# Patient Record
Sex: Female | Born: 1945 | Race: White | Hispanic: No | Smoking: Never smoker
Health system: Southern US, Community
[De-identification: ages and names within clinical notes are randomized; demographics above are authoritative.]

## PROBLEM LIST (undated history)

## (undated) DIAGNOSIS — M545 Low back pain, unspecified: Secondary | ICD-10-CM

## (undated) DIAGNOSIS — Z9889 Other specified postprocedural states: Secondary | ICD-10-CM

## (undated) DIAGNOSIS — I471 Supraventricular tachycardia, unspecified: Secondary | ICD-10-CM

## (undated) DIAGNOSIS — I499 Cardiac arrhythmia, unspecified: Secondary | ICD-10-CM

## (undated) DIAGNOSIS — I071 Rheumatic tricuspid insufficiency: Secondary | ICD-10-CM

## (undated) DIAGNOSIS — I4719 Other supraventricular tachycardia: Secondary | ICD-10-CM

## (undated) DIAGNOSIS — G8929 Other chronic pain: Secondary | ICD-10-CM

## (undated) DIAGNOSIS — I350 Nonrheumatic aortic (valve) stenosis: Secondary | ICD-10-CM

## (undated) DIAGNOSIS — E785 Hyperlipidemia, unspecified: Secondary | ICD-10-CM

## (undated) DIAGNOSIS — F32A Depression, unspecified: Secondary | ICD-10-CM

## (undated) DIAGNOSIS — N1832 Chronic kidney disease, stage 3b: Secondary | ICD-10-CM

## (undated) DIAGNOSIS — F329 Major depressive disorder, single episode, unspecified: Secondary | ICD-10-CM

## (undated) DIAGNOSIS — D649 Anemia, unspecified: Secondary | ICD-10-CM

## (undated) DIAGNOSIS — I951 Orthostatic hypotension: Secondary | ICD-10-CM

## (undated) DIAGNOSIS — M1990 Unspecified osteoarthritis, unspecified site: Secondary | ICD-10-CM

## (undated) DIAGNOSIS — I351 Nonrheumatic aortic (valve) insufficiency: Secondary | ICD-10-CM

## (undated) DIAGNOSIS — I209 Angina pectoris, unspecified: Secondary | ICD-10-CM

## (undated) DIAGNOSIS — J189 Pneumonia, unspecified organism: Secondary | ICD-10-CM

## (undated) DIAGNOSIS — R112 Nausea with vomiting, unspecified: Secondary | ICD-10-CM

## (undated) DIAGNOSIS — K219 Gastro-esophageal reflux disease without esophagitis: Secondary | ICD-10-CM

## (undated) DIAGNOSIS — I10 Essential (primary) hypertension: Secondary | ICD-10-CM

## (undated) DIAGNOSIS — K635 Polyp of colon: Secondary | ICD-10-CM

## (undated) DIAGNOSIS — F419 Anxiety disorder, unspecified: Secondary | ICD-10-CM

## (undated) DIAGNOSIS — I340 Nonrheumatic mitral (valve) insufficiency: Secondary | ICD-10-CM

## (undated) HISTORY — DX: Chronic kidney disease, stage 3b: N18.32

## (undated) HISTORY — DX: Hyperlipidemia, unspecified: E78.5

## (undated) HISTORY — DX: Gastro-esophageal reflux disease without esophagitis: K21.9

## (undated) HISTORY — DX: Depression, unspecified: F32.A

## (undated) HISTORY — DX: Unspecified osteoarthritis, unspecified site: M19.90

## (undated) HISTORY — DX: Essential (primary) hypertension: I10

## (undated) HISTORY — DX: Orthostatic hypotension: I95.1

## (undated) HISTORY — DX: Supraventricular tachycardia: I47.1

## (undated) HISTORY — DX: Nonrheumatic aortic (valve) stenosis: I35.0

## (undated) HISTORY — DX: Other supraventricular tachycardia: I47.19

## (undated) HISTORY — PX: CHOLECYSTECTOMY: SHX55

## (undated) HISTORY — PX: EYE SURGERY: SHX253

## (undated) HISTORY — DX: Nonrheumatic aortic (valve) insufficiency: I35.1

## (undated) HISTORY — DX: Polyp of colon: K63.5

## (undated) HISTORY — DX: Nonrheumatic mitral (valve) insufficiency: I34.0

## (undated) HISTORY — DX: Rheumatic tricuspid insufficiency: I07.1

## (undated) HISTORY — DX: Major depressive disorder, single episode, unspecified: F32.9

---

## 1978-02-14 HISTORY — PX: COSMETIC SURGERY: SHX468

## 1997-02-14 DIAGNOSIS — K635 Polyp of colon: Secondary | ICD-10-CM

## 1997-02-14 HISTORY — DX: Polyp of colon: K63.5

## 1997-06-03 ENCOUNTER — Other Ambulatory Visit: Admission: RE | Admit: 1997-06-03 | Discharge: 1997-06-03 | Payer: Self-pay | Admitting: Family Medicine

## 1997-09-17 ENCOUNTER — Ambulatory Visit (HOSPITAL_BASED_OUTPATIENT_CLINIC_OR_DEPARTMENT_OTHER): Admission: RE | Admit: 1997-09-17 | Discharge: 1997-09-17 | Payer: Self-pay | Admitting: Orthopedic Surgery

## 1998-04-16 ENCOUNTER — Ambulatory Visit (HOSPITAL_COMMUNITY): Admission: RE | Admit: 1998-04-16 | Discharge: 1998-04-16 | Payer: Self-pay

## 1999-03-12 ENCOUNTER — Other Ambulatory Visit: Admission: RE | Admit: 1999-03-12 | Discharge: 1999-03-12 | Payer: Self-pay | Admitting: Obstetrics and Gynecology

## 2000-04-06 ENCOUNTER — Other Ambulatory Visit: Admission: RE | Admit: 2000-04-06 | Discharge: 2000-04-06 | Payer: Self-pay | Admitting: Obstetrics and Gynecology

## 2001-04-11 ENCOUNTER — Other Ambulatory Visit: Admission: RE | Admit: 2001-04-11 | Discharge: 2001-04-11 | Payer: Self-pay | Admitting: Obstetrics and Gynecology

## 2002-04-15 ENCOUNTER — Other Ambulatory Visit: Admission: RE | Admit: 2002-04-15 | Discharge: 2002-04-15 | Payer: Self-pay | Admitting: Obstetrics and Gynecology

## 2002-05-06 ENCOUNTER — Encounter (INDEPENDENT_AMBULATORY_CARE_PROVIDER_SITE_OTHER): Payer: Self-pay | Admitting: Specialist

## 2002-05-06 ENCOUNTER — Ambulatory Visit (HOSPITAL_COMMUNITY): Admission: RE | Admit: 2002-05-06 | Discharge: 2002-05-06 | Payer: Self-pay | Admitting: Gastroenterology

## 2003-04-23 ENCOUNTER — Other Ambulatory Visit: Admission: RE | Admit: 2003-04-23 | Discharge: 2003-04-23 | Payer: Self-pay | Admitting: Obstetrics and Gynecology

## 2004-04-23 ENCOUNTER — Other Ambulatory Visit: Admission: RE | Admit: 2004-04-23 | Discharge: 2004-04-23 | Payer: Self-pay | Admitting: Obstetrics and Gynecology

## 2005-04-25 ENCOUNTER — Other Ambulatory Visit: Admission: RE | Admit: 2005-04-25 | Discharge: 2005-04-25 | Payer: Self-pay | Admitting: Obstetrics and Gynecology

## 2006-05-13 ENCOUNTER — Encounter: Admission: RE | Admit: 2006-05-13 | Discharge: 2006-05-13 | Payer: Self-pay | Admitting: Orthopaedic Surgery

## 2006-05-17 ENCOUNTER — Other Ambulatory Visit: Admission: RE | Admit: 2006-05-17 | Discharge: 2006-05-17 | Payer: Self-pay | Admitting: Obstetrics and Gynecology

## 2007-05-23 ENCOUNTER — Other Ambulatory Visit: Admission: RE | Admit: 2007-05-23 | Discharge: 2007-05-23 | Payer: Self-pay | Admitting: Obstetrics and Gynecology

## 2008-05-28 ENCOUNTER — Other Ambulatory Visit: Admission: RE | Admit: 2008-05-28 | Discharge: 2008-05-28 | Payer: Self-pay | Admitting: Obstetrics and Gynecology

## 2008-10-08 ENCOUNTER — Observation Stay (HOSPITAL_COMMUNITY): Admission: EM | Admit: 2008-10-08 | Discharge: 2008-10-10 | Payer: Self-pay | Admitting: Emergency Medicine

## 2010-05-22 LAB — URINALYSIS, ROUTINE W REFLEX MICROSCOPIC
Bilirubin Urine: NEGATIVE
Ketones, ur: NEGATIVE mg/dL
Nitrite: NEGATIVE
Specific Gravity, Urine: 1.009 (ref 1.005–1.030)
Urobilinogen, UA: 0.2 mg/dL (ref 0.0–1.0)

## 2010-05-22 LAB — CBC
Platelets: 243 10*3/uL (ref 150–400)
RDW: 13.4 % (ref 11.5–15.5)

## 2010-05-22 LAB — CARDIAC PANEL(CRET KIN+CKTOT+MB+TROPI)
CK, MB: 1.3 ng/mL (ref 0.3–4.0)
CK, MB: 1.4 ng/mL (ref 0.3–4.0)
Relative Index: INVALID (ref 0.0–2.5)
Relative Index: INVALID (ref 0.0–2.5)
Total CK: 53 U/L (ref 7–177)
Troponin I: 0.01 ng/mL (ref 0.00–0.06)
Troponin I: 0.01 ng/mL (ref 0.00–0.06)

## 2010-05-22 LAB — DIFFERENTIAL
Basophils Absolute: 0 10*3/uL (ref 0.0–0.1)
Lymphocytes Relative: 13 % (ref 12–46)
Neutro Abs: 5.9 10*3/uL (ref 1.7–7.7)

## 2010-05-22 LAB — BASIC METABOLIC PANEL
BUN: 10 mg/dL (ref 6–23)
Calcium: 8.6 mg/dL (ref 8.4–10.5)
GFR calc non Af Amer: >60 mL/min (ref >60)
Glucose, Bld: 103 mg/dL — ABNORMAL HIGH (ref 70–99)

## 2010-06-29 NOTE — H&P (Signed)
Nicole Graves, Nicole Graves                  ACCOUNT NO.:  [id number]   MEDICAL RECORD NO.:  [id number]          PATIENT TYPE:  EMS   LOCATION:  ED                           FACILITY:  [facility]   PHYSICIAN:  Peggye Pitt, M.D. DATE OF BIRTH:  1945/08/15   DATE OF ADMISSION:  10/08/2008  DATE OF DISCHARGE:                              HISTORY & PHYSICAL   PRIMARY CARE PHYSICIAN:  [name], MD   CHIEF COMPLAINT:  Vertigo.   HISTORY OF PRESENT ILLNESS:  Nicole Graves is a 65 year old female who  yesterday afternoon became very dizzy, said that the room was spinning.  She fell down.  She vomited her undigested lunch.  She continued to feel  nauseous and vomit throughout the evening.  During this time, she states  that the room continued to spin.  After a couple of hours, the spinning  passed.  She was able to get up and go to bed.  She took her evening  Flexeril and tramadol and slept well through the night.  This morning,  she woke up and felt okay.  Upon arising, she sat on the side of the bed  and had no dizziness and no vertigo.  She went to the bathroom, washed  her face, and then walking down the floor, the room began to spin; she  fell down and hit her nose.  She stayed on the floor this morning for 4  hours, continued with nausea and dry heaving.  She called family  members, and she was unable to get up off the floor secondary to  weakness, so they called 911 who transported her to the emergency room.  During these episodes, she denies chest pain, shortness of breath, no  headache, visual disturbances, no numbness or tingling of her  extremities.  She continues with her vertigo, and we were asked to admit  for further evaluation and treatment.   ALLERGIES:  SULFA.   PAST MEDICAL HISTORY:  1. Depression.  2. GERD.  3. Hiatal hernia.   PAST SURGICAL HISTORY:  1. Cholecystectomy.  2. Cesarean section x2.  3. Tubes in her ears removed 3 years ago.  4. Surgery on her toe in 2005.   SOCIAL HISTORY:  Negative for EtOH, negative for tobacco.   FAMILY HISTORY:  Mother deceased at the age of 48 with a history of  colon cancer, carotid artery disease, cataracts, degenerative joint  disease and congestive heart failure.  Father was unknown.  Brother  deceased recently with a history of degenerative joint disease and  coronary artery disease.  She has a sister who is alive with CAD,  diabetes, glaucoma and osteoarthritis.   MEDICATIONS:  1. Cymbalta 30 mg p.o. 3 tabs daily.  2. Wellbutrin 300 mg p.o. daily.  3. Vitamin D 2000 mEq p.o. daily.  4. Claritin 10 mg p.o. daily.  5. Prilosec 20 mg p.o. b.i.d.  6. Celebrex 200 mg p.o. b.i.d.  7. Flexeril 10 mg p.o. at bedtime.  8. Tramadol 50 mg 2 tabs at bedtime.   REVIEW OF SYSTEMS:  CONSTITUTIONAL:  Negative for fever,  chills,  positive for increased fatigue and weakness over the last 2 months.  HEENT:  Positive for nasal congestion, chronic sinus congestion,  negative for throat, negative ear pain or ringing of the ears.  CARDIOVASCULAR:  Negative for chest pain, negative for lower extremity  edema.  RESPIRATORY:  Negative shortness of breath, negative cough.  NEURO:  See HPI.  MUSCULOSKELETAL:  Positive for occasional knee pain,  positive for neck pain, otherwise no joint pain or muscle weakness.  GI:  Positive constipation, negative melena, negative abdominal pain.  GU:  Negative dysuria, negative hematuria, positive occasional frequency.  PSYCH:  Positive for depression, positive for anxiety.  SKIN:  Negative  for rashes.  Negative for lesions.   LABORATORIES AND RADIOLOGY:  White count 7.1, hemoglobin 13.3,  hematocrit 38.7, platelets 243, sodium 137, potassium 3.9, chloride 107,  CO2 of 26, BUN 10, creatinine 0.81, glucose 103, neutrophils with an 82%  shift, UA negative.  EKG with normal sinus rhythm at a rate of 86.  CT  spine:  Negative fracture, anterior subluxation C3 on C4 and  retrolisthesis C4 on C5.  CT  of the head:  No acute intracranial  abnormality, negative for hemorrhage, mass or lesion.  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   PHYSICAL EXAMINATION:  VITAL SIGNS:  Temperature 97.5, blood pressure  132/72, heart rate 89, respirations 18, O2 saturations 97% on room air.  GENERAL:  Alert and oriented, in no acute distress.  HEENT:  Head is normocephalic, atraumatic.  NECK:  Supple.  No JVD.  CARDIOVASCULAR:  Regular rate and rhythm.  No lower extremity edema.  No  murmur.  No rub.  NEUROLOGIC:  Speech clear.  Facial symmetry.  Sensation intact  bilaterally.  Bilateral grips equal, 5/5.  RESPIRATORY:  No increased work of breathing, clear to auscultation  bilaterally, no wheeze, no rales.  SKIN:  Warm and dry.  No lesions.  No rashes.  ABDOMEN:  Round, soft, decreased bowel sounds, nontender to palpation.  PSYCHIATRIC:  Very teary this afternoon secondary to the recent death of  her brother.  She is calm, cooperative, verbalizes anxiety about her  health.   ASSESSMENT/PLAN:  1. Vertigo, nausea and vomiting:  Vertigo origin is unclear. ?BPH vs      osthostasis vs posterior circulation CVA vs cardiac etiology      (unlikely).  EKG is normal.  CT of the head with no acute      abnormality.  CBC and electrolytes within normal limits.  Will rule      out cardiac source by admitting to telemetry.  Cycle cardiac      enzymes.  Check orthostatics.  Will also check magnesium and      calcium levels.  Patient's medications could also be contributing,      so we will hold Prilosec, Celebrex, Flexeril and tramadol.  Will      ask physical therapy and occupational therapy for vestibular      exercises.  If vertigo does not improve after above measures, will       consider MRI of brain to rule out posterior circulation      cerebrovascular accident.  Will also consider discontinuing      telemetry after the first 24 hours if no  arrhythmias.  2. Depression:  Currently stable.  Will continue Cymbalta and      Wellbutrin.  3. Gastroesophageal reflux disease:  Currently stable.  Will hold      Prilosec for now secondary to vertigo.  Will monitor closely and      adjust as needed.     ______________________________  Toya Smothers, NP      Peggye Pitt, M.D.  Electronically Signed    KB/MEDQ  D:  10/08/2008  T:  10/08/2008  Job:  [id number]

## 2010-07-02 NOTE — Op Note (Signed)
   NAME:  Nicole Graves, Nicole Graves                            ACCOUNT NO.:  [id number]   MEDICAL RECORD NO.:  [id number]                   PATIENT TYPE:  AMB   LOCATION:  ENDO                                 FACILITY:  [facility]   PHYSICIAN:  Petra Kuba, M.D.                 DATE OF BIRTH:  19-Dec-1945   DATE OF PROCEDURE:  05/06/2002  DATE OF DISCHARGE:                                 OPERATIVE REPORT   PROCEDURE PERFORMED:  Esophagogastroduodenoscopy.   ENDOSCOPIST:  Petra Kuba, M.D.   INDICATIONS FOR PROCEDURE:  Patient with longstanding upper tract symptoms,  want to rule out significant abnormality.  Consent was signed after the  risks, benefits, methods and options were thoroughly discussed prior to any  premeds given and multiple times in the past.   ADDITIONAL MEDICINES USED:  Demerol 40 mg only since it followed  colonoscopy.   DESCRIPTION OF PROCEDURE:  The video endoscope was inserted by direct  vision.  She did have a small to medium size hiatal hernia but no signs of  esophagitis or Barrett's.  Scope passed into the stomach and advanced  through a normal antrum, normal pylorus into a normal duodenal bulb and  around the C-loop to a normal second portion of the duodenum.  The scope was  withdrawn back to the bulb and a good look there ruled out ulcers in that  location.  Scope was withdrawn back to the stomach which was evaluated on  straight and retroflex visualization.  High in the cardia, the hiatal hernia  was confirmed.  Otherwise, the fundus, angularis, lesser and greater curve  were normal.  Air was suctioned, scope was slowly withdrawn.  Again, a good  look at the hiatal hernia pouch in the esophagus was normal.  Scope was  removed.  The patient tolerated the procedure well.  There were no obvious  immediate complication.   ENDOSCOPIC DIAGNOSES:  1. Small to medium-sized hiatal hernia.  2. Otherwise normal esophagogastroduodenoscopy.   PLAN:  Continue pump inhibitors.   Happy to see back p.r.n.  Otherwise return  care to Dr. Hyacinth Meeker for the customary health care maintenance to include  yearly rectals and guaiacs.                                               Petra Kuba, M.D.    MEM/MEDQ  D:  05/06/2002  T:  05/06/2002  Job:  [id number]   cc:   [name], M.D.  [address]  [address] [place], [place] [address]  Fax: [phone]

## 2010-07-02 NOTE — Op Note (Signed)
   NAME:  Nicole Graves, Nicole Graves                            ACCOUNT NO.:  [id number]   MEDICAL RECORD NO.:  [id number]                   PATIENT TYPE:  AMB   LOCATION:  ENDO                                 FACILITY:   PHYSICIAN:  Petra Kuba, M.D.                 DATE OF BIRTH:  February 08, 1946   DATE OF PROCEDURE:  05/06/2002  DATE OF DISCHARGE:                                 OPERATIVE REPORT   PROCEDURE:  Colonoscopy.   INDICATIONS:  Family history of colon cancer.  Due for repeat screening.  Consent was signed after risks, benefits, methods, options were thoroughly  discussed in the office in the past.   PREMEDICATIONS:  Demerol 60 mg, Versed 10 mg.   DESCRIPTION OF PROCEDURE:  Rectal inspection pertinent for external  hemorrhoids, small.  Digital exam was negative.  Video colonoscope was  inserted and fairly easily advanced around the colon to the cecum.  This  required some abdominal pressure but no position changes.  No obvious  abnormality was seen on insertion.  The cecum was identified by the  appendiceal orifice and the ileocecal valve.  The scope was slowly  withdrawn.  Prep was adequate.  There was some liquid stool that required  washing and suctioning.  On slow withdrawal through the colon, the cecum,  ascending and transverse were normal.  There was a rare early left-sided  diverticula and one tiny distal sigmoid polyp which was cold biopsied x 2.  Once back in the rectum, the scope was retroflexed, pertinent for some  internal hemorrhoids.  The scope was straightened and readvanced a short  ways up the left side of the colon.  Air was suctioned and the scope  removed.  The patient tolerated the procedure well.  There were no obvious  immediate complications.   ENDOSCOPIC DIAGNOSES:  1. Internal and external hemorrhoids.  2. Rare left-sided diverticula.  3. Tiny distal sigmoid polyp cold biopsied.  4. Otherwise within normal limits to the cecum.   PLAN:  Await pathology.   Probably recheck colon screening every five years.  Continue workup with a one-time EGD.                                                Petra Kuba, M.D.    MEM/MEDQ  D:  05/06/2002  T:  05/06/2002  Job:  [id number]   cc:   [name], M.D.  [address]  [address] [place], [place] [address]  Fax: [phone]

## 2012-08-09 ENCOUNTER — Encounter: Payer: Self-pay | Admitting: Obstetrics and Gynecology

## 2012-08-15 ENCOUNTER — Ambulatory Visit: Payer: Self-pay | Admitting: Obstetrics and Gynecology

## 2012-12-24 ENCOUNTER — Telehealth: Payer: Self-pay | Admitting: Cardiology

## 2012-12-24 NOTE — Telephone Encounter (Signed)
Pt is aware.  

## 2012-12-24 NOTE — Telephone Encounter (Signed)
To Dr Turner to advise 

## 2012-12-24 NOTE — Telephone Encounter (Signed)
New Problem:  Pt is asking if she can switch to taking her toporlol at night b/c it is making her sleepy. Please advise

## 2012-12-24 NOTE — Telephone Encounter (Signed)
Ok to take at night. 

## 2013-09-06 ENCOUNTER — Encounter: Payer: Self-pay | Admitting: Cardiology

## 2013-11-27 ENCOUNTER — Encounter: Payer: Self-pay | Admitting: Physician Assistant

## 2013-11-27 ENCOUNTER — Ambulatory Visit (INDEPENDENT_AMBULATORY_CARE_PROVIDER_SITE_OTHER): Payer: Medicare Other | Admitting: Physician Assistant

## 2013-11-27 VITALS — BP 130/90 | HR 92 | Ht 65.0 in | Wt 198.0 lb

## 2013-11-27 DIAGNOSIS — I471 Supraventricular tachycardia: Secondary | ICD-10-CM | POA: Insufficient documentation

## 2013-11-27 MED ORDER — METOPROLOL SUCCINATE ER 25 MG PO TB24: 25.0000 mg | ORAL_TABLET | Freq: Every evening | ORAL | Status: DC | PRN

## 2013-11-27 NOTE — Progress Notes (Signed)
Cardiology Office Note   Date:  11/27/2013   ID:  Nicole Graves, DOB [birth year], MRN [id number]  PCP:  No primary provider on file.  Cardiologist:  Dr. Armanda Magicraci Turner     History of Present Illness: Nicole Graves is a 68 y.o. female with a history of paroxysmal atrial tachycardia. Last seen by Dr. Mayford Knifeurner 10/2012.  She returns for FU.  She is overall doing well.  She has occasional chest pain.  She has had this for years without change.  She denies significant dyspnea, orthopnea, PND, edema.  She denies syncope.  Her palpitations remain controlled on beta blocker.  She now takes it at bedtime due to fatigue.    Studies:  - Echo (1/13):  Mild LVH, moderate asymmetric septal hypertrophy, EF 50-55%, mild TR, grade 1 diastolic dysfunction  - Nuclear (total 1/13):  No ischemia, EF 73%   Recent Labs/Images:  No results found for this basename: NA, K, BUN, CREATININE, ALT, HGB, TSH, LDL, LDLCALC, LDLDIRECT, HDL, BNP, PROBNP,  in the last 8760 hours  02/05/13: Creatinine 0.98, potassium 4.4, ALT 10    Wt Readings from Last 3 Encounters:  11/27/13 198 lb (89.812 kg)     Past Medical History  Diagnosis Date  . Depression   . Colon polyp 1999  . GERD (gastroesophageal reflux disease)   . Arthritis     Knees, neck, back  . GERD (gastroesophageal reflux disease)     Current Outpatient Prescriptions  Medication Sig Dispense Refill  . buPROPion (WELLBUTRIN XL) 300 MG 24 hr tablet Take 300 mg by mouth daily.      . celecoxib (CELEBREX) 200 MG capsule Take 200 mg by mouth daily.       . Cholecalciferol (VITAMIN D) 2000 UNITS CAPS Take by mouth.      . cyclobenzaprine (FLEXERIL) 10 MG tablet Take 10 mg by mouth daily.       . DULoxetine (CYMBALTA) 30 MG capsule Take 30 mg by mouth daily.      . metoprolol succinate (TOPROL-XL) 25 MG 24 hr tablet Take 25 mg by mouth at bedtime and may repeat dose one time if needed.       Marland Kitchen. omeprazole (PRILOSEC) 20 MG capsule Take 20 mg by mouth daily.        . Pantoprazole Sodium (PROTONIX PO) Take by mouth.      . traMADol (ULTRAM) 50 MG tablet Take 50 mg by mouth at bedtime and may repeat dose one time if needed.        No current facility-administered medications for this visit.     Allergies:   Review of patient's allergies indicates no known allergies.   Social History:  The patient  reports that she has never smoked. She does not have any smokeless tobacco history on file.   Family History:  The patient's family history includes Colon cancer in her mother; Heart attack in her brother; Hypertension in her brother. There is no history of Stroke.   ROS:  Please see the history of present illness.      All other systems reviewed and negative.    PHYSICAL EXAM: VS:  BP 130/90  Pulse 92  Ht 5\' 5"  (1.651 m)  Wt 198 lb (89.812 kg)  BMI 32.95 kg/m2 Well nourished, well developed, in no acute distress HEENT: normal Neck: no JVD Cardiac:  normal S1, S2; RRR; 2/6 systolic murmurRUSB Lungs:  clear to auscultation bilaterally, no wheezing, rhonchi or rales Abd: soft, nontender,  no hepatomegaly Ext: no edema Skin: warm and dry Neuro:  CNs 2-12 intact, no focal abnormalities noted  EKG:  NSR, HR 92, normal axis, no ST changes      ASSESSMENT AND PLAN:  1.  Atrial Tachycardia:  Stable on beta blocker. Continue current Rx.  Medication refilled.  Disposition:   FU with Dr. Armanda Magicraci Turner 1 year.    Signed, Brynda RimScott Yoshiye Kraft, PA-C, MHS 11/27/2013 2:40 PM    [facility] [address], [place], [place]  [address] Phone: [phone]; Fax: [phone]

## 2013-11-27 NOTE — Patient Instructions (Signed)
Schedule a follow up with Dr. Armanda Magicraci Turner in 1 year.  The office will send you a card 2-3 months prior to this so you can call to schedule.  A refill for your Toprol-XL was sent to your pharmacy.

## 2014-12-22 ENCOUNTER — Other Ambulatory Visit: Payer: Self-pay

## 2014-12-22 DIAGNOSIS — I471 Supraventricular tachycardia: Secondary | ICD-10-CM

## 2014-12-22 MED ORDER — METOPROLOL SUCCINATE ER 25 MG PO TB24: 25.0000 mg | ORAL_TABLET | Freq: Every evening | ORAL | Status: DC | PRN

## 2015-02-10 ENCOUNTER — Ambulatory Visit: Payer: Self-pay | Admitting: Physician Assistant

## 2015-02-10 NOTE — Progress Notes (Signed)
Cardiology Office Note    Date:  02/11/2015   ID:  Carlyon ShadowLinda J Klose, DOB 27-Sep-1945, MRN [id number]  PCP:  Neldon LabellaMILLER,LISA LYNN, MD  Cardiologist:  Dr. Armanda Magicraci Turner   Electrophysiologist:  n/a  Chief Complaint  Patient presents with  . Follow-up    Atrial Tachycardia    History of Present Illness:  Nicole Graves is a 69 y.o. female with a hx of paroxysmal atrial tachycardia. Last seen by Dr. Mayford Knifeurner 10/2012.I saw her in 10/15.  She returns for FU. She is overall doing well. She denies chest pain. She denies significant dyspnea, orthopnea, PND, edema. She denies syncope. Her palpitations remain controlled on beta blocker. She is getting knee injections for DJD. She has a hx of Phen-Phen use.  She is worried about valve issues.     Past Medical History  Diagnosis Date  . Depression   . Colon polyp 1999  . GERD (gastroesophageal reflux disease)   . Arthritis     Knees, neck, back  . GERD (gastroesophageal reflux disease)     Past Surgical History  Procedure Laterality Date  . Cesarean section  1964    X2    Current Outpatient Prescriptions  Medication Sig Dispense Refill  . Brexpiprazole (REXULTI) 1 MG TABS Take 1 mg by mouth daily.    Marland Kitchen. buPROPion (WELLBUTRIN XL) 300 MG 24 hr tablet Take 300 mg by mouth daily.    . celecoxib (CELEBREX) 200 MG capsule Take 200 mg by mouth daily.     . Cholecalciferol (VITAMIN D) 2000 UNITS CAPS Take by mouth.    . cyclobenzaprine (FLEXERIL) 10 MG tablet Take 10 mg by mouth daily.     . diclofenac sodium (VOLTAREN) 1 % GEL Apply 2 g topically 4 (four) times daily.     . DULoxetine (CYMBALTA) 30 MG capsule Take 30 mg by mouth daily.    . metoprolol succinate (TOPROL-XL) 25 MG 24 hr tablet Take 1 tablet (25 mg total) by mouth at bedtime and may repeat dose one time if needed. 90 tablet 3  . omeprazole (PRILOSEC) 20 MG capsule Take 20 mg by mouth daily.    . traMADol (ULTRAM) 50 MG tablet Take 50 mg by mouth every 6 (six) hours as needed for  moderate pain.      No current facility-administered medications for this visit.    Allergies:   Review of patient's allergies indicates no known allergies.   Social History   Social History  . Marital Status: Divorced    Spouse Name: N/A  . Number of Children: N/A  . Years of Education: N/A   Social History Main Topics  . Smoking status: Never Smoker   . Smokeless tobacco: None  . Alcohol Use: None  . Drug Use: None  . Sexual Activity: Not Asked   Other Topics Concern  . None   Social History Narrative     Family History:  The patient's family history includes Colon cancer in her mother; Heart attack in her brother; Hypertension in her brother. There is no history of Stroke.   ROS:   Please see the history of present illness.    Review of Systems  Constitution: Positive for weight gain.  Eyes: Positive for visual disturbance.  Musculoskeletal: Positive for joint pain and joint swelling.  Psychiatric/Behavioral: Positive for depression.  All other systems reviewed and are negative.   PHYSICAL EXAM:   VS:  BP 120/74 mmHg  Pulse 86  Ht 5\' 4"  (1.626 m)  Wt 203 lb 1.9 oz (92.135 kg)  BMI 34.85 kg/m2   GEN: Well nourished, well developed, in no acute distress HEENT: normal Neck: no JVD, no masses Cardiac: Normal S1/S2, RRR; 2/6 systolic murmur LSB, rubs, or gallops, no edema   Respiratory:  clear to auscultation bilaterally; no wheezing, rhonchi or rales GI: soft, nontender, nondistended, + BS MS: no deformity or atrophy Skin: warm and dry, no rash Neuro:  Bilateral strength equal, no focal deficits  Psych: Alert and oriented x 3, normal affect  Wt Readings from Last 3 Encounters:  02/11/15 203 lb 1.9 oz (92.135 kg)  11/27/13 198 lb (89.812 kg)      Studies/Labs Reviewed:   EKG:  EKG is ordered today.  The ekg ordered today demonstrates NSR, HR 86, normal axis, QTC 435 ms, no change since prior tracing  Recent Labs: No results found for requested labs  within last 365 days.   Recent Lipid Panel No results found for: CHOL, TRIG, HDL, CHOLHDL, VLDL, LDLCALC, LDLDIRECT  Additional studies/ records that were reviewed today include:   Echo (1/13):  Mild LVH, moderate asymmetric septal hypertrophy, EF 50-55%, mild TR, grade 1 diastolic dysfunction  Nuclear (1/13):  No ischemia, EF 73%   ASSESSMENT:    1. Paroxysmal atrial tachycardia (HCC)   2. Murmur, cardiac     PLAN:  In order of problems listed above:  1. Atrial Tachycardia: Stable on beta blocker. Continue current Rx. Medication refilled.  2. Murmur - She has a hx of mild TR on echo in 2013.  She does have a murmur and used Phen-Phen in the past. I will arrange a FU echo.      Medication Adjustments/Labs and Tests Ordered: Current medicines are reviewed at length with the patient today.  Concerns regarding medicines are outlined above.  Medication changes, Labs and Tests ordered today are listed below. Patient Instructions  Medication Instructions:  A REFILL FOR TOPROL XL WAS SENT IN TODAY  Labwork: NONE  Testing/Procedures: Your physician has requested that you have an echocardiogram. Echocardiography is a painless test that uses sound waves to create images of your heart. It provides your doctor with information about the size and shape of your heart and how well your heart's chambers and valves are working. This procedure takes approximately one hour. There are no restrictions for this procedure.   Follow-Up: DR. [name] IN 1 YEAR; WE WILL SEND OUT A REMINDER LETTER A COUPLE OF MONTHS EARLIER  Any Other Special Instructions Will Be Listed Below (If Applicable).  If you need a refill on your cardiac medications before your next appointment, please call your pharmacy.        Signed, [name], PA-C  02/11/2015 9:02 AM    [facility] [address] [place], [place], [place]  [address] Phone: [phone]; Fax: [phone]

## 2015-02-11 ENCOUNTER — Encounter: Payer: Self-pay | Admitting: Physician Assistant

## 2015-02-11 ENCOUNTER — Ambulatory Visit (HOSPITAL_COMMUNITY): Payer: Medicare Other | Attending: Physician Assistant

## 2015-02-11 ENCOUNTER — Ambulatory Visit (INDEPENDENT_AMBULATORY_CARE_PROVIDER_SITE_OTHER): Payer: Medicare Other | Admitting: Physician Assistant

## 2015-02-11 ENCOUNTER — Other Ambulatory Visit: Payer: Self-pay

## 2015-02-11 VITALS — BP 120/74 | HR 86 | Ht 64.0 in | Wt 203.1 lb

## 2015-02-11 DIAGNOSIS — R011 Cardiac murmur, unspecified: Secondary | ICD-10-CM

## 2015-02-11 DIAGNOSIS — I471 Supraventricular tachycardia: Secondary | ICD-10-CM | POA: Diagnosis not present

## 2015-02-11 DIAGNOSIS — I071 Rheumatic tricuspid insufficiency: Secondary | ICD-10-CM | POA: Insufficient documentation

## 2015-02-11 DIAGNOSIS — I352 Nonrheumatic aortic (valve) stenosis with insufficiency: Secondary | ICD-10-CM | POA: Diagnosis not present

## 2015-02-11 DIAGNOSIS — I517 Cardiomegaly: Secondary | ICD-10-CM | POA: Diagnosis not present

## 2015-02-11 MED ORDER — METOPROLOL SUCCINATE ER 25 MG PO TB24: 25.0000 mg | ORAL_TABLET | Freq: Every evening | ORAL | Status: DC | PRN

## 2015-02-11 NOTE — Patient Instructions (Addendum)
Medication Instructions:  A REFILL FOR TOPROL XL WAS SENT IN TODAY  Labwork: NONE  Testing/Procedures: Your physician has requested that you have an echocardiogram. Echocardiography is a painless test that uses sound waves to create images of your heart. It provides your doctor with information about the size and shape of your heart and how well your heart's chambers and valves are working. This procedure takes approximately one hour. There are no restrictions for this procedure.   Follow-Up: DR. Mayford KnifeURNER IN 1 YEAR; WE WILL SEND OUT A REMINDER LETTER A COUPLE OF MONTHS EARLIER  Any Other Special Instructions Will Be Listed Below (If Applicable).  If you need a refill on your cardiac medications before your next appointment, please call your pharmacy.

## 2015-02-12 ENCOUNTER — Telehealth: Payer: Self-pay | Admitting: Physician Assistant

## 2015-02-12 DIAGNOSIS — R011 Cardiac murmur, unspecified: Secondary | ICD-10-CM

## 2015-02-12 NOTE — Telephone Encounter (Signed)
Pt has been notified of echo results and findings by phone. Pt asked for me to give results to her daughter Haywood LassoLynette. Went over results and findings with Haywood LassoLynette by phone with verbal understanding. Repeat echo in 1 yr.

## 2015-02-12 NOTE — Telephone Encounter (Signed)
New Message    Pt is calling stating she is returning Nicole Graves call from 02/10/15

## 2015-02-14 ENCOUNTER — Other Ambulatory Visit: Payer: Self-pay

## 2015-02-14 ENCOUNTER — Encounter (HOSPITAL_COMMUNITY): Payer: Self-pay | Admitting: *Deleted

## 2015-02-14 ENCOUNTER — Emergency Department (HOSPITAL_COMMUNITY): Payer: Medicare Other

## 2015-02-14 ENCOUNTER — Observation Stay (HOSPITAL_COMMUNITY)
Admission: EM | Admit: 2015-02-14 | Discharge: 2015-02-15 | Disposition: A | Discharge status: Home / Self Care | Payer: Medicare Other | Attending: Internal Medicine | Admitting: Internal Medicine

## 2015-02-14 DIAGNOSIS — R7989 Other specified abnormal findings of blood chemistry: Secondary | ICD-10-CM

## 2015-02-14 DIAGNOSIS — K219 Gastro-esophageal reflux disease without esophagitis: Secondary | ICD-10-CM | POA: Insufficient documentation

## 2015-02-14 DIAGNOSIS — M170 Bilateral primary osteoarthritis of knee: Secondary | ICD-10-CM | POA: Insufficient documentation

## 2015-02-14 DIAGNOSIS — I471 Supraventricular tachycardia: Secondary | ICD-10-CM | POA: Diagnosis not present

## 2015-02-14 DIAGNOSIS — F329 Major depressive disorder, single episode, unspecified: Secondary | ICD-10-CM | POA: Insufficient documentation

## 2015-02-14 DIAGNOSIS — Z791 Long term (current) use of non-steroidal anti-inflammatories (NSAID): Secondary | ICD-10-CM | POA: Diagnosis not present

## 2015-02-14 DIAGNOSIS — R778 Other specified abnormalities of plasma proteins: Secondary | ICD-10-CM

## 2015-02-14 DIAGNOSIS — M479 Spondylosis, unspecified: Secondary | ICD-10-CM | POA: Diagnosis not present

## 2015-02-14 DIAGNOSIS — J189 Pneumonia, unspecified organism: Secondary | ICD-10-CM | POA: Insufficient documentation

## 2015-02-14 DIAGNOSIS — Z79899 Other long term (current) drug therapy: Secondary | ICD-10-CM | POA: Insufficient documentation

## 2015-02-14 DIAGNOSIS — R079 Chest pain, unspecified: Secondary | ICD-10-CM | POA: Diagnosis not present

## 2015-02-14 DIAGNOSIS — E785 Hyperlipidemia, unspecified: Secondary | ICD-10-CM | POA: Diagnosis not present

## 2015-02-14 HISTORY — DX: Supraventricular tachycardia: I47.1

## 2015-02-14 HISTORY — DX: Supraventricular tachycardia, unspecified: I47.10

## 2015-02-14 LAB — BASIC METABOLIC PANEL
Anion gap: 10 (ref 5–15)
BUN: 20 mg/dL (ref 6–20)
CO2: 23 mmol/L (ref 22–32)
Calcium: 9.4 mg/dL (ref 8.9–10.3)
Chloride: 107 mmol/L (ref 101–111)
Creatinine, Ser: 1.09 mg/dL — ABNORMAL HIGH (ref 0.44–1.00)
GFR calc Af Amer: 58 mL/min — ABNORMAL LOW (ref >60)
GFR calc non Af Amer: 50 mL/min — ABNORMAL LOW (ref >60)
Glucose, Bld: 101 mg/dL — ABNORMAL HIGH (ref 65–99)
Potassium: 4.3 mmol/L (ref 3.5–5.1)
Sodium: 140 mmol/L (ref 135–145)

## 2015-02-14 LAB — CBC
HCT: 39.2 % (ref 36.0–46.0)
HEMATOCRIT: 36.3 % (ref 36.0–46.0)
HEMOGLOBIN: 12 g/dL (ref 12.0–15.0)
Hemoglobin: 12.6 g/dL (ref 12.0–15.0)
MCH: 28.6 pg (ref 26.0–34.0)
MCH: 29.4 pg (ref 26.0–34.0)
MCHC: 32.1 g/dL (ref 30.0–36.0)
MCHC: 33.1 g/dL (ref 30.0–36.0)
MCV: 89.1 fL (ref 78.0–100.0)
MCV: 89 fL (ref 78.0–100.0)
Platelets: 284 10*3/uL (ref 150–400)
Platelets: 251 10*3/uL (ref 150–400)
RBC: 4.4 MIL/uL (ref 3.87–5.11)
RBC: 4.08 MIL/uL (ref 3.87–5.11)
RDW: 13.9 % (ref 11.5–15.5)
RDW: 13.8 % (ref 11.5–15.5)
WBC: 11 10*3/uL — ABNORMAL HIGH (ref 4.0–10.5)
WBC: 8.1 10*3/uL (ref 4.0–10.5)

## 2015-02-14 LAB — I-STAT TROPONIN, ED: Troponin i, poc: 0.17 ng/mL (ref 0.00–0.08)

## 2015-02-14 LAB — CREATININE, SERUM
CREATININE: 1.14 mg/dL — AB (ref 0.44–1.00)
GFR calc non Af Amer: 47 mL/min — ABNORMAL LOW (ref >60)
GFR, EST AFRICAN AMERICAN: 55 mL/min — AB (ref >60)

## 2015-02-14 LAB — TROPONIN I
Troponin I: 0.19 ng/mL — ABNORMAL HIGH (ref <0.031)
Troponin I: 0.2 ng/mL — ABNORMAL HIGH (ref <0.031)

## 2015-02-14 MED ORDER — CYCLOBENZAPRINE HCL 10 MG PO TABS
10.0000 mg | ORAL_TABLET | Freq: Every day | ORAL | Status: DC
  Administered 2015-02-14: 10 mg via ORAL
  Filled 2015-02-14: qty 1

## 2015-02-14 MED ORDER — SODIUM CHLORIDE 0.9 % IV SOLN
INTRAVENOUS | Status: DC
  Administered 2015-02-14: 17:00:00 via INTRAVENOUS

## 2015-02-14 MED ORDER — TRAMADOL HCL 50 MG PO TABS
50.0000 mg | ORAL_TABLET | Freq: Four times a day (QID) | ORAL | Status: DC | PRN
  Administered 2015-02-14: 50 mg via ORAL
  Filled 2015-02-14: qty 1

## 2015-02-14 MED ORDER — BREXPIPRAZOLE 1 MG PO TABS
1.0000 mg | ORAL_TABLET | Freq: Every day | ORAL | Status: DC
  Administered 2015-02-15: 1 mg via ORAL
  Filled 2015-02-14: qty 1

## 2015-02-14 MED ORDER — METOPROLOL SUCCINATE ER 25 MG PO TB24
25.0000 mg | ORAL_TABLET | Freq: Every day | ORAL | Status: DC
  Administered 2015-02-14: 25 mg via ORAL
  Filled 2015-02-14: qty 1

## 2015-02-14 MED ORDER — NITROGLYCERIN 2 % TD OINT
1.0000 [in_us] | TOPICAL_OINTMENT | Freq: Once | TRANSDERMAL | Status: AC
  Administered 2015-02-14: 1 [in_us] via TOPICAL
  Filled 2015-02-14: qty 1

## 2015-02-14 MED ORDER — ATORVASTATIN CALCIUM 10 MG PO TABS
10.0000 mg | ORAL_TABLET | Freq: Every day | ORAL | Status: DC
  Administered 2015-02-15: 10 mg via ORAL
  Filled 2015-02-14: qty 1

## 2015-02-14 MED ORDER — KETOROLAC TROMETHAMINE 15 MG/ML IJ SOLN
15.0000 mg | Freq: Once | INTRAMUSCULAR | Status: AC
  Administered 2015-02-14: 15 mg via INTRAVENOUS
  Filled 2015-02-14: qty 1

## 2015-02-14 MED ORDER — DULOXETINE HCL 30 MG PO CPEP
30.0000 mg | ORAL_CAPSULE | Freq: Three times a day (TID) | ORAL | Status: DC
  Administered 2015-02-14: 30 mg via ORAL
  Filled 2015-02-14 (×2): qty 1

## 2015-02-14 MED ORDER — LEVOFLOXACIN 750 MG PO TABS
750.0000 mg | ORAL_TABLET | Freq: Every day | ORAL | Status: DC
  Administered 2015-02-14 – 2015-02-15 (×2): 750 mg via ORAL
  Filled 2015-02-14 (×3): qty 1

## 2015-02-14 MED ORDER — BUPROPION HCL ER (XL) 300 MG PO TB24
300.0000 mg | ORAL_TABLET | Freq: Every day | ORAL | Status: DC
  Administered 2015-02-15: 300 mg via ORAL
  Filled 2015-02-14: qty 1

## 2015-02-14 MED ORDER — ENOXAPARIN SODIUM 40 MG/0.4ML ~~LOC~~ SOLN
40.0000 mg | SUBCUTANEOUS | Status: DC
  Administered 2015-02-14: 40 mg via SUBCUTANEOUS
  Filled 2015-02-14: qty 0.4

## 2015-02-14 MED ORDER — MORPHINE SULFATE (PF) 2 MG/ML IV SOLN: 2.0000 mg | INTRAVENOUS | Status: DC | PRN

## 2015-02-14 MED ORDER — SODIUM CHLORIDE 0.9 % IJ SOLN
3.0000 mL | Freq: Two times a day (BID) | INTRAMUSCULAR | Status: DC
  Administered 2015-02-14: 3 mL via INTRAVENOUS

## 2015-02-14 MED ORDER — PANTOPRAZOLE SODIUM 40 MG PO TBEC
40.0000 mg | DELAYED_RELEASE_TABLET | Freq: Every day | ORAL | Status: DC
  Administered 2015-02-15: 40 mg via ORAL
  Filled 2015-02-14: qty 1

## 2015-02-14 MED ORDER — NITROGLYCERIN 2 % TD OINT
1.0000 [in_us] | TOPICAL_OINTMENT | Freq: Four times a day (QID) | TRANSDERMAL | Status: DC
  Administered 2015-02-14 – 2015-02-15 (×3): 1 [in_us] via TOPICAL
  Filled 2015-02-14: qty 30

## 2015-02-14 MED ORDER — SODIUM CHLORIDE 0.9 % IV SOLN: 250.0000 mL | INTRAVENOUS | Status: DC | PRN

## 2015-02-14 MED ORDER — SODIUM CHLORIDE 0.9 % IJ SOLN: 3.0000 mL | INTRAMUSCULAR | Status: DC | PRN

## 2015-02-14 NOTE — H&P (Addendum)
PCP:   Neldon Labella, MD   Chief Complaint:  Chest pain  HPI: 69 year old female who   has a past medical history of Depression; Colon polyp (1999); GERD (gastroesophageal reflux disease); Arthritis; GERD (gastroesophageal reflux disease); Aortic insufficiency; and SVT (supraventricular tachycardia) (HCC). Today came to the ED with chest pain. Patient says that she developed heaviness in chest which persisted throughout the night and into this morning. When patient went to her job she was very dizzy and shortness of breath or palpitations. Patient has a history of paroxysmal atrial tachycardia and is on metoprolol. She went to the urgent care and was found to have narrow complex tachycardia with rate more than 170 per EMS. Patient converted back to normal sinus rhythm with Valsalva maneuver prior to arrival. In the ED she complain of chest pressure and troponin was elevated 0.19. Cardiology was consulted by the ED physician, who recommended admit for observation and cycle the troponin. Likely troponin elevated from demand ischemia from SVT.  Allergies:   Allergies  Allergen Reactions  . Sulfa Antibiotics       Past Medical History  Diagnosis Date  . Depression   . Colon polyp 1999  . GERD (gastroesophageal reflux disease)   . Arthritis     Knees, neck, back  . GERD (gastroesophageal reflux disease)   . Aortic insufficiency     Echo 12/16: mild LVH, vigorous LVF, no RWMA, Gr 1 DD, mild AS (mean 15 mmHg), mod AI, mild LAE, mod TR, PASP 33 mmHg  . SVT (supraventricular tachycardia) [facility])     Past Surgical History  Procedure Laterality Date  . Cesarean section  1964    X2  . Cholecystectomy      Prior to Admission medications   Medication Sig Start Date End Date Taking? Authorizing Provider  Brexpiprazole (REXULTI) 1 MG TABS Take 1 mg by mouth daily.   Yes Historical Provider, MD  buPROPion (WELLBUTRIN XL) 300 MG 24 hr tablet Take 300 mg by mouth daily.   Yes Historical  Provider, MD  celecoxib (CELEBREX) 200 MG capsule Take 200 mg by mouth daily.    Yes Historical Provider, MD  Cholecalciferol (VITAMIN D) 2000 UNITS CAPS Take 1 capsule by mouth daily.    Yes Historical Provider, MD  cyclobenzaprine (FLEXERIL) 10 MG tablet Take 10 mg by mouth at bedtime.    Yes Historical Provider, MD  diclofenac sodium (VOLTAREN) 1 % GEL Apply 2 g topically 3 (three) times daily as needed.  12/26/14  Yes Historical Provider, MD  DULoxetine (CYMBALTA) 30 MG capsule Take 30 mg by mouth 3 (three) times daily.    Yes Historical Provider, MD  metoprolol succinate (TOPROL-XL) 25 MG 24 hr tablet Take 1 tablet (25 mg total) by mouth at bedtime and may repeat dose one time if needed. Patient taking differently: Take 25 mg by mouth at bedtime.  02/11/15  Yes Scott Moishe Spice, PA-C  omeprazole (PRILOSEC) 20 MG capsule Take 20 mg by mouth daily.   Yes Historical Provider, MD  traMADol (ULTRAM) 50 MG tablet Take 50 mg by mouth every 6 (six) hours as needed for moderate pain.    Yes Historical Provider, MD  atorvastatin (LIPITOR) 10 MG tablet Take 1 tablet by mouth daily. 02/11/15   Historical Provider, MD    Social History:  reports that she has never smoked. She does not have any smokeless tobacco history on file. She reports that she does not drink alcohol or use illicit drugs.  Family  History  Problem Relation Age of Onset  . Colon cancer Mother   . Heart attack Brother   . Stroke Neg Hx   . Hypertension Brother     Ceasar MonsFiled Weights   02/14/15 1115  Weight: 92.08 kg (203 lb)    All the positives are listed in BOLD  Review of Systems:  HEENT: Headache, blurred vision, runny nose, sore throat Neck: Hypothyroidism, hyperthyroidism,,lymphadenopathy Chest : Shortness of breath, history of COPD, Asthma Heart : Chest pain, history of coronary arterey disease GI:  Nausea, vomiting, diarrhea, constipation, GERD GU: Dysuria, urgency, frequency of urination, hematuria Neuro: Stroke,  seizures, syncope Psych: Depression, anxiety, hallucinations   Physical Exam: Blood pressure 134/85, pulse 94, temperature 98.4 F (36.9 C), temperature source Oral, resp. rate 15, height 5\' 5"  (1.651 m), weight 92.08 kg (203 lb), SpO2 96 %. Constitutional:   Patient is a well-developed and well-nourished female* in no acute distress and cooperative with exam. Head: Normocephalic and atraumatic Mouth: Mucus membranes moist Eyes: PERRL, EOMI, conjunctivae normal Neck: Supple, No Thyromegaly Cardiovascular: RRR, S1 normal, S2 normal Pulmonary/Chest: CTAB, no wheezes, rales, or rhonchi Abdominal: Soft. Non-tender, non-distended, bowel sounds are normal, no masses, organomegaly, or guarding present.  Neurological: A&O x3, Strength is normal and symmetric bilaterally, cranial nerve II-XII are grossly intact, no focal motor deficit, sensory intact to light touch bilaterally.  Extremities : No Cyanosis, Clubbing or Edema  Labs on Admission:  Basic Metabolic Panel:  Recent Labs Lab 02/14/15 1120  NA 140  K 4.3  CL 107  CO2 23  GLUCOSE 101*  BUN 20  CREATININE 1.09*  CALCIUM 9.4   CBC:  Recent Labs Lab 02/14/15 1120  WBC 11.0*  HGB 12.6  HCT 39.2  MCV 89.1  PLT 284   Cardiac Enzymes:  Recent Labs Lab 02/14/15 1410  TROPONINI 0.19*     Radiological Exams on Admission: Dg Chest 2 View  02/14/2015  CLINICAL DATA:  Chest pain and shortness of breath. Coughing congestion with intermittent fever over 2 weeks. EXAM: CHEST - 2 VIEW COMPARISON:  None FINDINGS: Opacity in the medial left lower lobe is suspicious for focal infiltrate. No edema, pleural fluid or pneumothorax identified. The heart size is normal. There may be a component of hiatal hernia. Diffuse spondylosis noted of the thoracic spine. IMPRESSION: Findings by chest x-ray suspicious for a medial left lower lobe infiltrate. Possible hiatal hernia. Electronically Signed   By: Irish LackGlenn  Yamagata M.D.   On: 02/14/2015  12:00    EKG: Independently reviewed. Normal sinus rhythm   Assessment/Plan Active Problems:   Chest pain   SVT  Chest pain Patient still has ongoing chest pressure, started on nitroglycerin glycerin 2% 1 patch every 6 hours. Cycle cardiac enzymes. Monitor on telemetry.  Paroxysmal atrial tachycardia Converted back to normal sinus rhythm with Valsalva maneuver  Continue metoprolol  ? Community acquired pneumonia CXR shows medial left lower lobe infiltrate , will start Levaquin per pharmacy.  Depression Continue Wellbutrin, duloxetine, Brexiprazole.  Hyperlipidemia Continue atorvastatin  DVT prophylaxis Lovenox  Code status: Full code  Family discussion: Admission, patients condition and plan of care including tests being ordered have been discussed with the patient and her daughter and sister at bedside* who indicate understanding and agree with the plan and Code Status.   Time Spent on Admission: 45 min  Koraima Albertsen S [facility] Pager: [phone] 02/14/2015, 3:05 PM  If 7PM-7AM, please contact night-coverage  [email or web address]  Password [email or web address]

## 2015-02-14 NOTE — Progress Notes (Signed)
Levaquin 750mg  po ordered by MD

## 2015-02-14 NOTE — Progress Notes (Signed)
Pt admitted from ED for observation having come into hospital with c/o chest pain. Alert, oriented and able to voice needs. Denies pain at this time. Oxygen sats 100% on room air. Family at bedside. No concerns voiced

## 2015-02-14 NOTE — ED Provider Notes (Addendum)
CSN: [id number]     Arrival date & time 02/14/15  1103 History   First MD Initiated Contact with Patient 02/14/15 1147     Chief Complaint  Patient presents with  . Chest Pain    svt     (Consider location/radiation/quality/duration/timing/severity/associated sxs/prior Treatment) HPI   69 year old female presenting with chest pain. Onset last night and persisting throughout the night and into this morning. Associated with dizziness, palpitations and mild shortness of breath. Patient has a past history of paroxysmal atrial tachycardia. She is on metoprolol for this reports compliance. She had narrow complex tachycardia with rate ~170 for EMS. She converted to a sinus rhythm with Valsalva maneuver prior to arrival. Her symptoms abated with conversion. She currently has no acute complaints. She actually had routine cardiology follow-up with an echocardiogram a few days ago.  Left ventricle: The cavity size was normal. There was mild concentric hypertrophy. Systolic function was vigorous. The estimated ejection fraction was in the range of 65% to 70%. Wall motion was normal; there were no regional wall motion abnormalities. Doppler parameters are consistent with abnormal left ventricular relaxation (grade 1 diastolic dysfunction). - Aortic valve: Valve mobility was mildly restricted. There was mild stenosis. There was moderate regurgitation. Regurgitation pressure half-time: 369 ms. - Left atrium: The atrium was mildly dilated. - Right ventricle: The cavity size was normal. Wall thickness was normal. Systolic function was normal. - Tricuspid valve: There was moderate regurgitation. - Pulmonary arteries: Systolic pressure was mildly increased. PA peak pressure: 33 mm Hg (S). - Inferior vena cava: The vessel was normal in size. The respirophasic diameter changes were in the normal range (>= 50%), consistent with normal central venous pressure.   Past Medical  History  Diagnosis Date  . Depression   . Colon polyp 1999  . GERD (gastroesophageal reflux disease)   . Arthritis     Knees, neck, back  . GERD (gastroesophageal reflux disease)   . Aortic insufficiency     Echo 12/16: mild LVH, vigorous LVF, no RWMA, Gr 1 DD, mild AS (mean 15 mmHg), mod AI, mild LAE, mod TR, PASP 33 mmHg  . SVT (supraventricular tachycardia) [facility](HCC)    Past Surgical History  Procedure Laterality Date  . Cesarean section  1964    X2  . Cholecystectomy     Family History  Problem Relation Age of Onset  . Colon cancer Mother   . Heart attack Brother   . Stroke Neg Hx   . Hypertension Brother    Social History  Substance Use Topics  . Smoking status: Never Smoker   . Smokeless tobacco: None  . Alcohol Use: No   OB History    No data available     Review of Systems  All systems reviewed and negative, other than as noted in HPI.   Allergies  Sulfa antibiotics  Home Medications   Prior to Admission medications   Medication Sig Start Date End Date Taking? Authorizing Provider  Brexpiprazole (REXULTI) 1 MG TABS Take 1 mg by mouth daily.    Historical Provider, MD  buPROPion (WELLBUTRIN XL) 300 MG 24 hr tablet Take 300 mg by mouth daily.    Historical Provider, MD  celecoxib (CELEBREX) 200 MG capsule Take 200 mg by mouth daily.     Historical Provider, MD  Cholecalciferol (VITAMIN D) 2000 UNITS CAPS Take by mouth.    Historical Provider, MD  cyclobenzaprine (FLEXERIL) 10 MG tablet Take 10 mg by mouth daily.     Historical  Provider, MD  diclofenac sodium (VOLTAREN) 1 % GEL Apply 2 g topically 4 (four) times daily.  12/26/14   Historical Provider, MD  DULoxetine (CYMBALTA) 30 MG capsule Take 30 mg by mouth daily.    Historical Provider, MD  metoprolol succinate (TOPROL-XL) 25 MG 24 hr tablet Take 1 tablet (25 mg total) by mouth at bedtime and may repeat dose one time if needed. 02/11/15   Beatrice Lecher, PA-C  omeprazole (PRILOSEC) 20 MG capsule Take 20 mg  by mouth daily.    Historical Provider, MD  traMADol (ULTRAM) 50 MG tablet Take 50 mg by mouth every 6 (six) hours as needed for moderate pain.     Historical Provider, MD   BP 106/69 mmHg  Pulse 98  Temp(Src) 98.4 F (36.9 C) (Oral)  Resp 17  Ht  (1.651 m)  Wt 203 lb (92.08 kg)  BMI 33.78 kg/m2  SpO2 100% Physical Exam  ED Course  Procedures (including critical care time) Labs Review Labs Reviewed  BASIC METABOLIC PANEL - Abnormal; Notable for the following:    Glucose, Bld 101 (*)    Creatinine, Ser 1.09 (*)    GFR calc non Af Amer 50 (*)    GFR calc Af Amer 58 (*)    All other components within normal limits  CBC - Abnormal; Notable for the following:    WBC 11.0 (*)    All other components within normal limits  I-STAT TROPOININ, ED - Abnormal; Notable for the following:    Troponin i, poc 0.17 (*)    All other components within normal limits    Imaging Review Dg Chest 2 View  02/14/2015  CLINICAL DATA:  Chest pain and shortness of breath. Coughing congestion with intermittent fever over 2 weeks. EXAM: CHEST - 2 VIEW COMPARISON:  None FINDINGS: Opacity in the medial left lower lobe is suspicious for focal infiltrate. No edema, pleural fluid or pneumothorax identified. The heart size is normal. There may be a component of hiatal hernia. Diffuse spondylosis noted of the thoracic spine. IMPRESSION: Findings by chest x-ray suspicious for a medial left lower lobe infiltrate. Possible hiatal hernia. Electronically Signed   By: Irish Lack M.D.   On: 02/14/2015 12:00   I have personally reviewed and evaluated these images and lab results as part of my medical decision-making.   EKG Interpretation   Date/Time:  Saturday February 14 2015 11:10:00 EST Ventricular Rate:  97 PR Interval:  139 QRS Duration: 106 QT Interval:  346 QTC Calculation: 439 R Axis:   42 Text Interpretation:  Sinus rhythm Artifact in lead(s) I II III aVR aVL  aVF Confirmed by Juleen China  MD,  Ren Grasse ([id number]) on 02/14/2015 11:40:57 AM      MDM   Final diagnoses:  SVT (supraventricular tachycardia) (HCC)  Elevated troponin    69 year old female with chest pressure, palpitations and dizziness likely secondary to SVT. Her symptoms resolved with conversion to sinus rhythm. Repeat EKG without any overt ischemic changes. Her troponin is mildly elevated but suspect that this is from demand ischemia secondary to prolonged accelerated rate as opposed to an ischemic event inciting symptoms. Will discuss with cardiology.   2:18 PM C/o CP again. Remains in sinus rhythm. Repeat troponin pending. Will admit for r/o.   Raeford Razor, MD 02/16/15 1611  Raeford Razor, MD 02/16/15 [phone]

## 2015-02-14 NOTE — Progress Notes (Signed)
Pt also says she discussed antibiotics with Dr Sharl MaLama in ED. MD paged

## 2015-02-14 NOTE — ED Notes (Signed)
Dr. Lama at bedside. 

## 2015-02-14 NOTE — Progress Notes (Signed)
Pt says she took her cymbalta and welbutrin this morning before coming to hospital. Please hold bedtime cymbalta and welbutrin if scheduled

## 2015-02-14 NOTE — Progress Notes (Signed)
MD called back and spoke directly to the pt . Told pt he was going to start antibiotics

## 2015-02-14 NOTE — ED Notes (Signed)
[facility] for central chest pressure that started last night. Initial HR was 170. Pt is on metoprolol for svt.  Converted to NSR in ambulance with valsalva maneuver. Given 324 asa en-route. VS per EMS 100 hr, 97% 2L [place], 108/70.  Present hr 99.

## 2015-02-15 DIAGNOSIS — J189 Pneumonia, unspecified organism: Secondary | ICD-10-CM | POA: Diagnosis not present

## 2015-02-15 DIAGNOSIS — R079 Chest pain, unspecified: Secondary | ICD-10-CM | POA: Diagnosis not present

## 2015-02-15 DIAGNOSIS — K219 Gastro-esophageal reflux disease without esophagitis: Secondary | ICD-10-CM | POA: Diagnosis not present

## 2015-02-15 DIAGNOSIS — I471 Supraventricular tachycardia: Secondary | ICD-10-CM | POA: Diagnosis not present

## 2015-02-15 DIAGNOSIS — F329 Major depressive disorder, single episode, unspecified: Secondary | ICD-10-CM | POA: Diagnosis not present

## 2015-02-15 LAB — CBC
HEMATOCRIT: 32.1 % — AB (ref 36.0–46.0)
Hemoglobin: 10.6 g/dL — ABNORMAL LOW (ref 12.0–15.0)
MCH: 29.4 pg (ref 26.0–34.0)
MCHC: 33 g/dL (ref 30.0–36.0)
MCV: 89.2 fL (ref 78.0–100.0)
PLATELETS: 206 10*3/uL (ref 150–400)
RBC: 3.6 MIL/uL — ABNORMAL LOW (ref 3.87–5.11)
RDW: 13.9 % (ref 11.5–15.5)
WBC: 5.4 10*3/uL (ref 4.0–10.5)

## 2015-02-15 LAB — COMPREHENSIVE METABOLIC PANEL
ALT: 12 U/L — ABNORMAL LOW (ref 14–54)
AST: 15 U/L (ref 15–41)
Albumin: 2.8 g/dL — ABNORMAL LOW (ref 3.5–5.0)
Alkaline Phosphatase: 50 U/L (ref 38–126)
Anion gap: 8 (ref 5–15)
BILIRUBIN TOTAL: 1 mg/dL (ref 0.3–1.2)
BUN: 15 mg/dL (ref 6–20)
CHLORIDE: 104 mmol/L (ref 101–111)
CO2: 26 mmol/L (ref 22–32)
Calcium: 8.9 mg/dL (ref 8.9–10.3)
Creatinine, Ser: 0.95 mg/dL (ref 0.44–1.00)
GFR, EST NON AFRICAN AMERICAN: 59 mL/min — AB (ref >60)
Glucose, Bld: 94 mg/dL (ref 65–99)
POTASSIUM: 4 mmol/L (ref 3.5–5.1)
Sodium: 138 mmol/L (ref 135–145)
TOTAL PROTEIN: 5.7 g/dL — AB (ref 6.5–8.1)

## 2015-02-15 LAB — TROPONIN I: Troponin I: 0.13 ng/mL — ABNORMAL HIGH (ref <0.031)

## 2015-02-15 MED ORDER — LEVOFLOXACIN 750 MG PO TABS: 750.0000 mg | ORAL_TABLET | Freq: Every day | ORAL | Status: DC

## 2015-02-15 MED ORDER — DULOXETINE HCL 60 MG PO CPEP
90.0000 mg | ORAL_CAPSULE | Freq: Every day | ORAL | Status: DC
  Administered 2015-02-15: 90 mg via ORAL
  Filled 2015-02-15: qty 1

## 2015-02-15 NOTE — Progress Notes (Signed)
Removed nitroglycerin patch this am. Pt ambulated 650 feet in hall with no chest pain, no abnormal rhythms, no dizziness. Pt comfortable in chair, call light within reach.  Leonidas Rombergaitlin S Bumbledare, RN

## 2015-02-15 NOTE — Discharge Summary (Signed)
Physician Discharge Summary  Nicole GarnetLinda J Cage GNF:[id number] DOB: [birth year] DOA: 02/14/2015  PCP: Neldon LabellaMILLER,LISA LYNN, MD  Admit date: 02/14/2015 Discharge date: 02/15/2015  Time spent: 25 minutes  Recommendations for Outpatient Follow-up:  1. Cardiology follow up   Discharge Diagnoses:  Active Problems:   Paroxysmal atrial tachycardia (HCC)   Chest pain   Discharge Condition: improved  Diet recommendation: cardiac  Filed Weights   02/14/15 1115  Weight: 92.08 kg (203 lb)    History of present illness:  70 year old female who  has a past medical history of Depression; Colon polyp (1999); GERD (gastroesophageal reflux disease); Arthritis; GERD (gastroesophageal reflux disease); Aortic insufficiency; and SVT (supraventricular tachycardia) (HCC). Today came to the ED with chest pain. Patient says that she developed heaviness in chest which persisted throughout the night and into this morning. When patient went to her job she was very dizzy and shortness of breath or palpitations. Patient has a history of paroxysmal atrial tachycardia and is on metoprolol. She went to the urgent care and was found to have narrow complex tachycardia with rate more than 170 per EMS. Patient converted back to normal sinus rhythm with Valsalva maneuver prior to arrival. In the ED she complain of chest pressure and troponin was elevated 0.19. Cardiology was consulted by the ED physician, who recommended admit for observation and cycle the troponin. Likely troponin elevated from demand ischemia from SVT.  Hospital Course:  Chest pain CE negative Related to SVT and stress  Paroxysmal atrial tachycardia Converted back to normal sinus rhythm with Valsalva maneuver  Continue metoprolol Cardiology follow up Just had echo 3 days ago  Community acquired pneumonia CXR shows medial left lower lobe infiltrate- had URI type symptoms levaquin x 5 days  Depression Continue Wellbutrin, duloxetine,  Brexiprazole.  Hyperlipidemia Continue atorvastatin   Procedures:    Consultations:  Cards (ER spoke with cards)  Discharge Exam: Filed Vitals:   02/14/15 1955 02/15/15 0601  BP: 110/62 111/69  Pulse: 94 77  Temp: 98.5 F (36.9 C) 97.5 F (36.4 C)  Resp: 16 16    General: no further CP, up walking the unit  Discharge Instructions   Discharge Instructions    Diet - low sodium heart healthy    Complete by:  As directed      Increase activity slowly    Complete by:  As directed           Current Discharge Medication List    START taking these medications   Details  levofloxacin (LEVAQUIN) 750 MG tablet Take 1 tablet (750 mg total) by mouth daily. Qty: 4 tablet, Refills: 0      CONTINUE these medications which have NOT CHANGED   Details  Brexpiprazole (REXULTI) 1 MG TABS Take 1 mg by mouth daily.    buPROPion (WELLBUTRIN XL) 300 MG 24 hr tablet Take 300 mg by mouth daily.    celecoxib (CELEBREX) 200 MG capsule Take 200 mg by mouth daily.     Cholecalciferol (VITAMIN D) 2000 UNITS CAPS Take 1 capsule by mouth daily.     cyclobenzaprine (FLEXERIL) 10 MG tablet Take 10 mg by mouth at bedtime.     diclofenac sodium (VOLTAREN) 1 % GEL Apply 2 g topically 3 (three) times daily as needed.     DULoxetine (CYMBALTA) 30 MG capsule Take 90 mg by mouth daily.     metoprolol succinate (TOPROL-XL) 25 MG 24 hr tablet Take 1 tablet (25 mg total) by mouth at bedtime and may repeat dose  one time if needed. Qty: 90 tablet, Refills: 3   Associated Diagnoses: Paroxysmal atrial tachycardia (HCC)    omeprazole (PRILOSEC) 20 MG capsule Take 20 mg by mouth daily.    traMADol (ULTRAM) 50 MG tablet Take 50 mg by mouth every 6 (six) hours as needed for moderate pain.     atorvastatin (LIPITOR) 10 MG tablet Take 1 tablet by mouth daily.       Allergies  Allergen Reactions  . Sulfa Antibiotics    Follow-up Information    Follow up with Neldon Labella, MD In 1 week.    Specialty:  Family Medicine   Contact information:   [address] [place] [address] [phone]       Follow up with Quintella Reichert, MD.   Specialty:  Cardiology   Why:  as previously arranged   Contact information:   [address] [place] [address] [phone]        The results of significant diagnostics from this hospitalization (including imaging, microbiology, ancillary and laboratory) are listed below for reference.    Significant Diagnostic Studies: Dg Chest 2 View  02/14/2015  CLINICAL DATA:  Chest pain and shortness of breath. Coughing congestion with intermittent fever over 2 weeks. EXAM: CHEST - 2 VIEW COMPARISON:  None FINDINGS: Opacity in the medial left lower lobe is suspicious for focal infiltrate. No edema, pleural fluid or pneumothorax identified. The heart size is normal. There may be a component of hiatal hernia. Diffuse spondylosis noted of the thoracic spine. IMPRESSION: Findings by chest x-ray suspicious for a medial left lower lobe infiltrate. Possible hiatal hernia. Electronically Signed   By: Irish Lack M.D.   On: 02/14/2015 12:00    Microbiology: No results found for this or any previous visit (from the past 240 hour(s)).   Labs: Basic Metabolic Panel:  Recent Labs Lab 02/14/15 1120 02/14/15 2010 02/15/15 0430  NA 140  --  138  K 4.3  --  4.0  CL 107  --  104  CO2 23  --  26  GLUCOSE 101*  --  94  BUN 20  --  15  CREATININE 1.09* 1.14* 0.95  CALCIUM 9.4  --  8.9   Liver Function Tests:  Recent Labs Lab 02/15/15 0430  AST 15  ALT 12*  ALKPHOS 50  BILITOT 1.0  PROT 5.7*  ALBUMIN 2.8*   No results for input(s): LIPASE, AMYLASE in the last 168 hours. No results for input(s): AMMONIA in the last 168 hours. CBC:  Recent Labs Lab 02/14/15 1120 02/14/15 2010 02/15/15 0430  WBC 11.0* 8.1 5.4  HGB 12.6 12.0 10.6*  HCT 39.2 36.3 32.1*  MCV 89.1 89.0 89.2  PLT 284 251 206   Cardiac  Enzymes:  Recent Labs Lab 02/14/15 1410 02/14/15 2010 02/15/15 0430  TROPONINI 0.19* 0.20* 0.13*   BNP: BNP (last 3 results) No results for input(s): BNP in the last 8760 hours.  ProBNP (last 3 results) No results for input(s): PROBNP in the last 8760 hours.  CBG: No results for input(s): GLUCAP in the last 168 hours.     Signed:  Rukaya Kleinschmidt Juanetta Gosling DO  [facility] 02/15/2015, 1:06 PM

## 2015-02-15 NOTE — Progress Notes (Signed)
Utilization Review Completed.Nicole Graves T1/02/2015  

## 2015-02-15 NOTE — Progress Notes (Signed)
Nitroglycerin patch removed per order. Pt denies pain. Instructed pt to call if chest pain began again. Pt comfortable, call light within reach.   Leonidas Rombergaitlin S Bumbledare, RN

## 2015-02-15 NOTE — Progress Notes (Signed)
Discussed with the patient and her daughter and all questioned fully answered. She will call me if any problems arise.  Geovani Tootle S Bumbledare, RN  

## 2015-09-03 ENCOUNTER — Telehealth: Payer: Self-pay

## 2015-09-03 NOTE — Telephone Encounter (Signed)
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 Clearance to be faxed to: [phone] attn: Meredith LeedsVelvet McBride  Patient will need to have evaluation prior to clearance.  Left message to call back.

## 2015-09-04 NOTE — Telephone Encounter (Signed)
Pt advised appt has been scheduled for her with Dr Mayford Knifeurner 09/16/15 at [facility] for surgical clearance.

## 2015-09-04 NOTE — Telephone Encounter (Signed)
Follow Up: ° ° °Returning Nicole Graves's call from yesterday. °

## 2015-09-15 ENCOUNTER — Encounter: Payer: Self-pay | Admitting: Cardiology

## 2015-09-15 DIAGNOSIS — I471 Supraventricular tachycardia, unspecified: Secondary | ICD-10-CM | POA: Insufficient documentation

## 2015-09-15 DIAGNOSIS — I350 Nonrheumatic aortic (valve) stenosis: Secondary | ICD-10-CM | POA: Insufficient documentation

## 2015-09-15 DIAGNOSIS — I351 Nonrheumatic aortic (valve) insufficiency: Secondary | ICD-10-CM | POA: Insufficient documentation

## 2015-09-15 NOTE — Progress Notes (Signed)
Cardiology Office Note    Date:  09/16/2015   ID:  Aldea, [name] 07-Dec-1945, MRN [id number]  PCP:  Neldon Labella, MD  Cardiologist:  [name], MD   Chief Complaint  Patient presents with  . Follow-up    SVT, AI    History of Present Illness:  Nicole Graves is a 70 y.o. female with a history of mild AS, moderate AI and SVT who presents today for followup.  She was hospitalized last December with CP and SVT which converted back to NSR with Valsalva.  She has not had any further bouts of SVT. She had a mildly elevated trop at that time which was felt to be due to demand ischemia and no further workup was done.   She is doing well today. She denies any chest pain, SOB, DOE, LE edema, dizziness (except for vertigo) or syncope.  She occasionally has some problems with skipped beats but no further SVT since December.     Past Medical History:  Diagnosis Date  . Aortic insufficiency    Echo 12/16: mild LVH, vigorous LVF, no RWMA, Gr 1 DD, mild AS (mean 15 mmHg), mod AI, mild LAE, mod TR, PASP 33 mmHg  . Arthritis    Knees, neck, back  . Colon polyp 1999  . Depression   . GERD (gastroesophageal reflux disease)   . SVT (supraventricular tachycardia) (HCC)     Past Surgical History:  Procedure Laterality Date  . CESAREAN SECTION  1964   X2  . CHOLECYSTECTOMY      Current Medications: Outpatient Medications Prior to Visit  Medication Sig Dispense Refill  . atorvastatin (LIPITOR) 10 MG tablet Take 1 tablet by mouth daily.    Marland Kitchen buPROPion (WELLBUTRIN XL) 300 MG 24 hr tablet Take 300 mg by mouth daily.    . celecoxib (CELEBREX) 200 MG capsule Take 200 mg by mouth daily.     . Cholecalciferol (VITAMIN D) 2000 UNITS CAPS Take 1 capsule by mouth daily.     . cyclobenzaprine (FLEXERIL) 10 MG tablet Take 10 mg by mouth at bedtime.     . diclofenac sodium (VOLTAREN) 1 % GEL Apply 2 g topically 3 (three) times daily as needed.     . DULoxetine (CYMBALTA) 30 MG capsule Take 90 mg  by mouth daily.     . metoprolol succinate (TOPROL-XL) 25 MG 24 hr tablet Take 1 tablet (25 mg total) by mouth at bedtime and may repeat dose one time if needed. (Patient taking differently: Take 25 mg by mouth at bedtime. ) 90 tablet 3  . omeprazole (PRILOSEC) 20 MG capsule Take 20 mg by mouth daily.    . traMADol (ULTRAM) 50 MG tablet Take 50 mg by mouth every 6 (six) hours as needed for moderate pain.     Marland Kitchen levofloxacin (LEVAQUIN) 750 MG tablet Take 1 tablet (750 mg total) by mouth daily. 4 tablet 0  . Brexpiprazole (REXULTI) 1 MG TABS Take 1 mg by mouth daily.     No facility-administered medications prior to visit.      Allergies:   Sulfa antibiotics   Social History   Social History  . Marital status: Divorced    Spouse name: N/A  . Number of children: N/A  . Years of education: N/A   Social History Main Topics  . Smoking status: Never Smoker  . Smokeless tobacco: Never Used  . Alcohol use No  . Drug use: No  . Sexual activity: Not Asked  Other Topics Concern  . None   Social History Narrative  . None     Family History:  The patient's family history includes Colon cancer in her mother; Heart attack in her brother; Hypertension in her brother.   ROS:   Please see the history of present illness.    Review of Systems  Gastrointestinal: Positive for constipation.  Psychiatric/Behavioral: Positive for depression.   All other systems reviewed and are negative.   PHYSICAL EXAM:   VS:  BP 124/70   Pulse 88   Ht  (1.651 m)   Wt 198 lb 12.8 oz (90.2 kg)   SpO2 98%   BMI 33.08 kg/m    GEN: Well nourished, well developed, in no acute distress  HEENT: normal  Neck: no JVD, carotid bruits, or masses Cardiac: RRR; no rubs, or gallops,no edema.  Intact distal pulses bilaterally. 2/6 SM at RUSB early peaking Respiratory:  clear to auscultation bilaterally, normal work of breathing GI: soft, nontender, nondistended, + BS MS: no deformity or atrophy  Skin: warm  and dry, no rash Neuro:  Alert and Oriented x 3, Strength and sensation are intact Psych: euthymic mood, full affect  Wt Readings from Last 3 Encounters:  09/16/15 198 lb 12.8 oz (90.2 kg)  02/14/15 203 lb (92.1 kg)  02/11/15 203 lb 1.9 oz (92.1 kg)      Studies/Labs Reviewed:   EKG:  EKG is not ordered today.    Recent Labs: 02/15/2015: ALT 12; BUN 15; Creatinine, Ser 0.95; Hemoglobin 10.6; Platelets 206; Potassium 4.0; Sodium 138   Lipid Panel No results found for: CHOL, TRIG, HDL, CHOLHDL, VLDL, LDLCALC, LDLDIRECT  Additional studies/ records that were reviewed today include:  none    ASSESSMENT:    1. SVT (supraventricular tachycardia) (HCC)   2. Aortic insufficiency   3. Aortic stenosis   4. Other chest pain   5. Preoperative cardiovascular examination      PLAN:  In order of problems listed above:  1. SVT with no reocurrence since last December.  Continue Toprol.  2. Aortic insufficiency - moderate by last echo.  Repeat echo in 01/2016. 3. Mild AS - by echo.  Repeat echo 01/2016. 4. Chest pain - this was an isolated event in setting of SVT with mildly elevated Trop that was probably from demand ischemia but since she is going to have a knee replacement I will order a Lexiscan myoview to risk stratify.  5. Preop cardiac clearance for Knee replacement - She is asymptomatic from a cardiac standpoint but since she had CP with an elevated Trop back in Dec I think we should get a Lexiscan myoview to rule out ischemia prior to her knee replacement.      Medication Adjustments/Labs and Tests Ordered: Current medicines are reviewed at length with the patient today.  Concerns regarding medicines are outlined above.  Medication changes, Labs and Tests ordered today are listed in the Patient Instructions below.  There are no Patient Instructions on file for this visit.   Signed, [name], MD  09/16/2015 3:09 PM    [facility] [address]  [place], [place], [place]  [address] Phone: [phone]; Fax: [phone]

## 2015-09-16 ENCOUNTER — Ambulatory Visit (INDEPENDENT_AMBULATORY_CARE_PROVIDER_SITE_OTHER): Payer: Medicare Other | Admitting: Cardiology

## 2015-09-16 ENCOUNTER — Encounter: Payer: Self-pay | Admitting: Cardiology

## 2015-09-16 VITALS — BP 124/70 | HR 88 | Ht 65.0 in | Wt 198.8 lb

## 2015-09-16 DIAGNOSIS — I351 Nonrheumatic aortic (valve) insufficiency: Secondary | ICD-10-CM

## 2015-09-16 DIAGNOSIS — I350 Nonrheumatic aortic (valve) stenosis: Secondary | ICD-10-CM | POA: Diagnosis not present

## 2015-09-16 DIAGNOSIS — R0789 Other chest pain: Secondary | ICD-10-CM

## 2015-09-16 DIAGNOSIS — I471 Supraventricular tachycardia: Secondary | ICD-10-CM

## 2015-09-16 DIAGNOSIS — Z01810 Encounter for preprocedural cardiovascular examination: Secondary | ICD-10-CM

## 2015-09-16 NOTE — Patient Instructions (Signed)
Medication Instructions:  Your physician recommends that you continue on your current medications as directed. Please refer to the Current Medication list given to you today.   Labwork: None  Testing/Procedures: Your physician has requested that you have an echocardiogram in December, 2017. Echocardiography is a painless test that uses sound waves to create images of your heart. It provides your doctor with information about the size and shape of your heart and how well your heart's chambers and valves are working. This procedure takes approximately one hour. There are no restrictions for this procedure.   Your physician has requested that you have a lexiscan myoview. For further information please visit [email or web address] Please follow instruction sheet, as given.  Follow-Up: Your physician wants you to follow-up in: 1 year with Dr. [name]. You will receive a reminder letter in the mail two months in advance. If you don't receive a letter, please call our office to schedule the follow-up appointment.   Any Other Special Instructions Will Be Listed Below (If Applicable).     If you need a refill on your cardiac medications before your next appointment, please call your pharmacy.

## 2015-10-05 ENCOUNTER — Encounter (HOSPITAL_COMMUNITY): Payer: Self-pay

## 2015-10-05 ENCOUNTER — Emergency Department (HOSPITAL_COMMUNITY)
Admission: EM | Admit: 2015-10-05 | Discharge: 2015-10-05 | Disposition: A | Discharge status: Home / Self Care | Payer: Medicare Other | Attending: Emergency Medicine | Admitting: Emergency Medicine

## 2015-10-05 ENCOUNTER — Emergency Department (HOSPITAL_COMMUNITY): Payer: Medicare Other

## 2015-10-05 DIAGNOSIS — Z79899 Other long term (current) drug therapy: Secondary | ICD-10-CM | POA: Insufficient documentation

## 2015-10-05 DIAGNOSIS — W108XXA Fall (on) (from) other stairs and steps, initial encounter: Secondary | ICD-10-CM | POA: Diagnosis not present

## 2015-10-05 DIAGNOSIS — Y999 Unspecified external cause status: Secondary | ICD-10-CM | POA: Insufficient documentation

## 2015-10-05 DIAGNOSIS — S8991XA Unspecified injury of right lower leg, initial encounter: Secondary | ICD-10-CM | POA: Diagnosis present

## 2015-10-05 DIAGNOSIS — Y9301 Activity, walking, marching and hiking: Secondary | ICD-10-CM | POA: Insufficient documentation

## 2015-10-05 DIAGNOSIS — S82124A Nondisplaced fracture of lateral condyle of right tibia, initial encounter for closed fracture: Secondary | ICD-10-CM | POA: Insufficient documentation

## 2015-10-05 DIAGNOSIS — Y9289 Other specified places as the place of occurrence of the external cause: Secondary | ICD-10-CM | POA: Diagnosis not present

## 2015-10-05 DIAGNOSIS — S82141A Displaced bicondylar fracture of right tibia, initial encounter for closed fracture: Secondary | ICD-10-CM

## 2015-10-05 MED ORDER — OXYCODONE-ACETAMINOPHEN 5-325 MG PO TABS
1.0000 | ORAL_TABLET | Freq: Once | ORAL | Status: AC
  Administered 2015-10-05: 1 via ORAL
  Filled 2015-10-05: qty 1

## 2015-10-05 MED ORDER — OXYCODONE-ACETAMINOPHEN 5-325 MG PO TABS: 1.0000 | ORAL_TABLET | ORAL | 0 refills | Status: DC | PRN

## 2015-10-05 NOTE — ED Triage Notes (Signed)
Pt fell today and landed on right.  Tripped.  No injury to head.  Pt also c/o rt wrist but states it is fine and doesn't need xray.  No blood thinners.

## 2015-10-05 NOTE — Progress Notes (Signed)
ED CM provided pt with RW prior to pt leaving Instructed on use and adjustment Pt and female family member appreciative of services rendered

## 2015-10-05 NOTE — ED Notes (Signed)
PT DISCHARGED. INSTRUCTIONS AND PRESCRIPTION GIVEN. AAOX4. PT IN NO APPARENT DISTRESS. THE OPPORTUNITY TO ASK QUESTIONS WAS PROVIDED. 

## 2015-10-05 NOTE — ED Notes (Signed)
Pt reports to having limited movement in her right knee d/t pain.

## 2015-10-05 NOTE — Discharge Instructions (Signed)
Please seek immediate medical attention if you have cold, pale foot or foot numbness.

## 2015-10-05 NOTE — ED Provider Notes (Signed)
[facility]-EMERGENCY DEPT Provider Note   CSN: [id number] Arrival date & time: 10/05/15  1129     History   Chief Complaint Chief Complaint  Patient presents with  . Knee Pain    HPI Nicole Graves is a 70 y.o. female.  70 year old female with aortic stenosis and arthritis who presents with right knee pain after a fall. This morning just prior to arrival, the patient was walking down a short flight of stairs and missed a step, falling forward onto her right knee. She has been unable to bear weight on her right leg since then due to severe, constant right knee pain. She also noted swelling. She denies any foot numbness or tingling. No head injury or loss of consciousness. No other injuries from the fall. No anticoagulant use. The patient does note that she follows with Dr. Berton LanAllusio in the orthopedics clinic and is scheduled to have knee replacement this fall.   The history is provided by the patient.  Knee Pain      Past Medical History:  Diagnosis Date  . Aortic insufficiency    Echo 12/16: mild LVH, vigorous LVF, no RWMA, Gr 1 DD, mild AS (mean 15 mmHg), mod AI, mild LAE, mod TR, PASP 33 mmHg  . Arthritis    Knees, neck, back  . Colon polyp 1999  . Depression   . GERD (gastroesophageal reflux disease)   . SVT (supraventricular tachycardia) [facility](HCC)     Patient Active Problem List   Diagnosis Date Noted  . Aortic insufficiency 09/15/2015  . SVT (supraventricular tachycardia) (HCC) 09/15/2015  . Aortic stenosis 09/15/2015  . Chest pain 02/14/2015  . Paroxysmal atrial tachycardia (HCC) 11/27/2013    Past Surgical History:  Procedure Laterality Date  . CESAREAN SECTION  1964   X2  . CHOLECYSTECTOMY      OB History    No data available       Home Medications    Prior to Admission medications   Medication Sig Start Date End Date Taking? Authorizing Provider  atorvastatin (LIPITOR) 10 MG tablet Take 1 tablet by mouth every evening.  02/11/15  Yes Historical Provider, MD    buPROPion (WELLBUTRIN XL) 300 MG 24 hr tablet Take 300 mg by mouth daily.   Yes Historical Provider, MD  celecoxib (CELEBREX) 200 MG capsule Take 200 mg by mouth daily.    Yes Historical Provider, MD  Cholecalciferol (VITAMIN D) 2000 UNITS CAPS Take 1 capsule by mouth daily.    Yes Historical Provider, MD  cyclobenzaprine (FLEXERIL) 10 MG tablet Take 10 mg by mouth at bedtime.    Yes Historical Provider, MD  diclofenac sodium (VOLTAREN) 1 % GEL Apply 2 g topically 3 (three) times daily as needed (pain. (both knees)).  12/26/14  Yes Historical Provider, MD  DULoxetine (CYMBALTA) 30 MG capsule Take 90 mg by mouth daily.    Yes Historical Provider, MD  lurasidone ([name]) 40 MG TABS tablet Take 40 mg by mouth every evening.    Yes Historical Provider, MD  Melatonin 5 MG TABS Take 5 mg by mouth at bedtime.   Yes Historical Provider, MD  metoprolol succinate (TOPROL-XL) 25 MG 24 hr tablet Take 1 tablet (25 mg total) by mouth at bedtime and may repeat dose one time if needed. Patient taking differently: Take 25 mg by mouth at bedtime.  02/11/15  Yes Scott Moishe Spice Weaver, PA-C  omeprazole (PRILOSEC) 20 MG capsule Take 20 mg by mouth daily.   Yes Historical Provider, MD  traMADol (  ULTRAM) 50 MG tablet Take 100 mg by mouth at bedtime.    Yes Historical Provider, MD  oxyCODONE-acetaminophen (PERCOCET) 5-325 MG tablet Take 1-2 tablets by mouth every 4 (four) hours as needed for severe pain. 10/05/15   Laurence Spates, MD    Family History Family History  Problem Relation Age of Onset  . Colon cancer Mother   . Heart attack Brother   . Hypertension Brother   . Stroke Neg Hx     Social History Social History  Substance Use Topics  . Smoking status: Never Smoker  . Smokeless tobacco: Never Used  . Alcohol use No     Allergies   Sulfa antibiotics   Review of Systems Review of Systems 10 Systems reviewed and are negative for acute change except as noted in the HPI.   Physical Exam Updated  Vital Signs BP 121/85 (BP Location: Right Arm)   Pulse 90   Temp 98.2 F (36.8 C) (Oral)   Resp 17   Ht 5\' 4"  (1.626 m)   Wt 200 lb (90.7 kg)   SpO2 100%   BMI 34.33 kg/m   Physical Exam  Constitutional: She is oriented to person, place, and time. She appears well-developed and well-nourished. No distress.  HENT:  Head: Normocephalic and atraumatic.  Eyes: Conjunctivae are normal.  Neck: Neck supple.  Musculoskeletal: She exhibits edema and tenderness.  Large effusion of R knee w/ tenderness to palpation along joint line and supralateral to patella; 2+ pedal pulses; normal sensation feet; leg compartments soft  Neurological: She is alert and oriented to person, place, and time.  Skin: Skin is warm and dry. Capillary refill takes less than 2 seconds.  Psychiatric: She has a normal mood and affect. Judgment normal.  Nursing note and vitals reviewed.    ED Treatments / Results  Labs (all labs ordered are listed, but only abnormal results are displayed) Labs Reviewed - No data to display  EKG  EKG Interpretation None       Radiology Ct Knee Right Wo Contrast  Result Date: 10/05/2015 CLINICAL DATA:  Evaluate tibial plateau fracture. EXAM: CT OF THE right KNEE WITHOUT CONTRAST TECHNIQUE: Multidetector CT imaging of the right knee was performed according to the standard protocol. Multiplanar CT image reconstructions were also generated. COMPARISON:  Radiographs 10/05/2015 FINDINGS: Bones/Joint/Cartilage There is a subtle fracture involving the lateral tibial select 0 without displacement or depression. The medial tibial plateau is intact. No femur fractures. Suspect subtle fractures involving the fibular head. The patella is intact. Significant tricompartmental degenerative changes with joint space narrowing, osteophytic spurring and probable fractures spurs or loose bodies in the anterior joint space. Large joint effusion. Ligaments Suboptimally assessed by CT. Muscles and Tendons  The quadriceps and patellar tendons are intact. The muscles are grossly normal. Soft tissues No significant soft tissue injury. IMPRESSION: Subtle nondisplaced nondepressed lateral tibial plateau fracture posteriorly. Subtle nondisplaced fibular head fractures. Lipohemarthrosis. Electronically Signed   By: Rudie Meyer M.D.   On: 10/05/2015 15:47   Dg Knee Complete 4 Views Right  Result Date: 10/05/2015 CLINICAL DATA:  Right knee pain after falling down stairs today. EXAM: RIGHT KNEE - COMPLETE 4+ VIEW COMPARISON:  None. FINDINGS: There is a nondisplaced fracture of the lateral tibial plateau seen only on 1 oblique view. There is a large joint effusion. 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 Ossified loose body in Hoffa's fat pad. IMPRESSION: 1. Subtle nondisplaced fracture of the lateral tibial plateau. 2. Large joint  effusion. 3. Osteoarthritis. Electronically Signed   By: Francene BoyersJames  Maxwell M.D.   On: 10/05/2015 12:57    Procedures Procedures (including critical care time)  Medications Ordered in ED Medications  oxyCODONE-acetaminophen (PERCOCET/ROXICET) 5-325 MG per tablet 1 tablet (1 tablet Oral Given 10/05/15 1442)  oxyCODONE-acetaminophen (PERCOCET/ROXICET) 5-325 MG per tablet 1 tablet (1 tablet Oral Given 10/05/15 1626)     Initial Impression / Assessment and Plan / ED Course  I have reviewed the triage vital signs and the nursing notes.  Pertinent & imaging results that were available during my care of the patient were reviewed by me and considered in my medical decision making (see chart for details).  Clinical Course    Pt w/ R knee pain after falling onto R knee. Neurovascularly intact distally, large knee effusion on exam. Plain films show right tibial plateau fracture. Obtained CT for better evaluation and gave the patient Percocet.  CT showed nondisplaced tibial plateau fracture. I discussed with orthopedic on call, Dr. Rennis ChrisSupple, appreciate his  assistance. He has recommended knee immobilizer, toe-touch weightbearing only, pain control, and follow-up with the patient's primary orthopedic surgeon Dr.Aluisio this week. Contacted case management to provide patient with walker. Discussed follow-up plan and supportive care including ice and elevation, provided with pain medications to use at home. Extensively reviewed return precautions including any signs of neurovascular compromise. Patient voiced understanding and was discharged in satisfactory condition.  Final Clinical Impressions(s) / ED Diagnoses   Final diagnoses:  Tibial plateau fracture, right, closed, initial encounter    New Prescriptions Discharge Medication List as of 10/05/2015  5:34 PM    START taking these medications   Details  oxyCODONE-acetaminophen (PERCOCET) 5-325 MG tablet Take 1-2 tablets by mouth every 4 (four) hours as needed for severe pain., Starting Mon 10/05/2015, Print         Laurence Spatesachel Morgan Jennifermarie Franzen, MD 10/05/15 [phone]

## 2015-10-26 ENCOUNTER — Telehealth (HOSPITAL_COMMUNITY): Payer: Self-pay | Admitting: *Deleted

## 2015-10-26 NOTE — Telephone Encounter (Signed)
Patient given detailed instructions per Myocardial Perfusion Study Information Sheet for the test on  10/28/15 Patient notified to arrive 15 minutes early and that it is imperative to arrive on time for appointment to keep from having the test rescheduled.  If you need to cancel or reschedule your appointment, please call the office within 24 hours of your appointment. Failure to do so may result in a cancellation of your appointment, and a $50 no show fee. Patient verbalized understanding. Kassidi Elza J Ezrie Bunyan, RN  

## 2015-10-28 ENCOUNTER — Encounter (HOSPITAL_COMMUNITY): Payer: Medicare Other

## 2015-10-28 ENCOUNTER — Ambulatory Visit (HOSPITAL_COMMUNITY): Payer: Medicare Other | Attending: Cardiovascular Disease

## 2015-10-28 DIAGNOSIS — Z01810 Encounter for preprocedural cardiovascular examination: Secondary | ICD-10-CM | POA: Insufficient documentation

## 2015-10-28 DIAGNOSIS — R0789 Other chest pain: Secondary | ICD-10-CM

## 2015-10-28 MED ORDER — TECHNETIUM TC 99M TETROFOSMIN IV KIT
33.0000 | PACK | Freq: Once | INTRAVENOUS | Status: AC | PRN
  Administered 2015-10-28: 33 via INTRAVENOUS
  Filled 2015-10-28: qty 33

## 2015-10-28 MED ORDER — REGADENOSON 0.4 MG/5ML IV SOLN
0.4000 mg | Freq: Once | INTRAVENOUS | Status: AC
  Administered 2015-10-28: 0.4 mg via INTRAVENOUS

## 2015-10-29 ENCOUNTER — Ambulatory Visit (HOSPITAL_COMMUNITY): Payer: Medicare Other | Attending: Cardiology

## 2015-10-29 LAB — MYOCARDIAL PERFUSION IMAGING
CHL CUP NUCLEAR SDS: 4
CHL CUP NUCLEAR SRS: 5
LV dias vol: 62 mL (ref 46–106)
LV sys vol: 21 mL
NUC STRESS TID: 0.78
Peak HR: 115 {beats}/min
RATE: 0.25
Rest HR: 88 {beats}/min
SSS: 9

## 2015-10-29 MED ORDER — TECHNETIUM TC 99M TETROFOSMIN IV KIT
32.4000 | PACK | Freq: Once | INTRAVENOUS | Status: AC | PRN
  Administered 2015-10-29: 32 via INTRAVENOUS
  Filled 2015-10-29: qty 32

## 2015-10-30 NOTE — Progress Notes (Signed)
Surgery on 11/30/2015.  Preop on 11/23/15.  Need orders in EPIC.  Thank You!

## 2015-11-12 ENCOUNTER — Ambulatory Visit: Payer: Self-pay | Admitting: Orthopedic Surgery

## 2015-11-16 ENCOUNTER — Other Ambulatory Visit: Payer: Self-pay | Admitting: Physician Assistant

## 2015-11-16 DIAGNOSIS — I471 Supraventricular tachycardia: Secondary | ICD-10-CM

## 2015-11-17 ENCOUNTER — Other Ambulatory Visit: Payer: Self-pay

## 2015-11-17 DIAGNOSIS — I4719 Other supraventricular tachycardia: Secondary | ICD-10-CM

## 2015-11-17 DIAGNOSIS — I471 Supraventricular tachycardia: Secondary | ICD-10-CM

## 2015-11-17 MED ORDER — METOPROLOL SUCCINATE ER 25 MG PO TB24: 25.0000 mg | ORAL_TABLET | Freq: Every evening | ORAL | 3 refills | Status: DC | PRN

## 2015-11-20 NOTE — Patient Instructions (Addendum)
Nicole Graves  11/20/2015   Your procedure is scheduled on: 10 16/17  Report to [facility] Main  Entrance take [facility]  elevators to 3rd floor to  Short Stay Center at 1030  AM.  Call this number if you have problems the morning of surgery [phone]   Remember: ONLY 1 PERSON MAY GO WITH YOU TO SHORT STAY TO GET  READY MORNING OF YOUR SURGERY.  Do not eat food:After Midnight.--- MAY HAVE CLEAR LIQUIDS MORNING OF SURGERY UNTIL 0745, THEN NOTHING     Take these medicines the morning of surgery with A SIP OF WATER: Wellbutrin, Duloxetine(cymbalta), Loratadine(claritin), Omeprazole May take Tramadol if needed DO NOT TAKE ANY DIABETIC MEDICATIONS DAY OF YOUR SURGERY                               You may not have any metal on your body including hair pins and              piercings  Do not wear jewelry, make-up, lotions, powders or perfumes, deodorant             Do not wear nail polish.  Do not shave  48 hours prior to surgery.              Men may shave face and neck.   Do not bring valuables to the hospital. [facility] IS NOT             RESPONSIBLE   FOR VALUABLES.  Contacts, dentures or bridgework may not be worn into surgery.  Leave suitcase in the car. After surgery it may be brought to your room.            [place] - Preparing for Surgery Before surgery, you can play an important role.  Because skin is not sterile, your skin needs to be as free of germs as possible.  You can reduce the number of germs on your skin by washing with CHG (chlorahexidine gluconate) soap before surgery.  CHG is an antiseptic cleaner which kills germs and bonds with the skin to continue killing germs even after washing. Please DO NOT use if you have an allergy to CHG or antibacterial soaps.  If your skin becomes reddened/irritated stop using the CHG and inform your nurse when you arrive at Short Stay. Do not shave (including legs and underarms) for at least 48 hours prior to the  first CHG shower.  You may shave your face/neck. Please follow these instructions carefully:  1.  Shower with CHG Soap the night before surgery and the  morning of Surgery.  2.  If you choose to wash your hair, wash your hair first as usual with your  normal  shampoo.  3.  After you shampoo, rinse your hair and body thoroughly to remove the  shampoo.                           4.  Use CHG as you would any other liquid soap.  You can apply chg directly  to the skin and wash                       Gently with a scrungie or clean washcloth.  5.  Apply the CHG Soap to your body  ONLY FROM THE NECK DOWN.   Do not use on face/ open                           Wound or open sores. Avoid contact with eyes, ears mouth and genitals (private parts).                       Wash face,  Genitals (private parts) with your normal soap.             6.  Wash thoroughly, paying special attention to the area where your surgery  will be performed.  7.  Thoroughly rinse your body with warm water from the neck down.  8.  DO NOT shower/wash with your normal soap after using and rinsing off  the CHG Soap.                9.  Pat yourself dry with a clean towel.            10.  Wear clean pajamas.            11.  Place clean sheets on your bed the night of your first shower and do not  sleep with pets. Day of Surgery : Do not apply any lotions/deodorants the morning of surgery.  Please wear clean clothes to the hospital/surgery center.  FAILURE TO FOLLOW THESE INSTRUCTIONS MAY RESULT IN THE CANCELLATION OF YOUR SURGERY PATIENT SIGNATURE_________________________________  NURSE SIGNATURE__________________________________  ________________________________________________________________________   Nicole Graves  An incentive spirometer is a tool that can help keep your lungs clear and active. This tool measures how well you are filling your lungs with each breath. Taking long deep breaths may help reverse or decrease  the chance of developing breathing (pulmonary) problems (especially infection) following:  A long period of time when you are unable to move or be active. BEFORE THE PROCEDURE   If the spirometer includes an indicator to show your best effort, your nurse or respiratory therapist will set it to a desired goal.  If possible, sit up straight or lean slightly forward. Try not to slouch.  Hold the incentive spirometer in an upright position. INSTRUCTIONS FOR USE  1. Sit on the edge of your bed if possible, or sit up as far as you can in bed or on a chair. 2. Hold the incentive spirometer in an upright position. 3. Breathe out normally. 4. Place the mouthpiece in your mouth and seal your lips tightly around it. 5. Breathe in slowly and as deeply as possible, raising the piston or the ball toward the top of the column. 6. Hold your breath for 3-5 seconds or for as long as possible. Allow the piston or ball to fall to the bottom of the column. 7. Remove the mouthpiece from your mouth and breathe out normally. 8. Rest for a few seconds and repeat Steps 1 through 7 at least 10 times every 1-2 hours when you are awake. Take your time and take a few normal breaths between deep breaths. 9. The spirometer may include an indicator to show your best effort. Use the indicator as a goal to work toward during each repetition. 10. After each set of 10 deep breaths, practice coughing to be sure your lungs are clear. If you have an incision (the cut made at the time of surgery), support your incision when coughing by placing a pillow or rolled up towels firmly against it. Once  you are able to get out of bed, walk around indoors and cough well. You may stop using the incentive spirometer when instructed by your caregiver.  RISKS AND COMPLICATIONS  Take your time so you do not get dizzy or light-headed.  If you are in pain, you may need to take or ask for pain medication before doing incentive spirometry. It is  harder to take a deep breath if you are having pain. AFTER USE  Rest and breathe slowly and easily.  It can be helpful to keep track of a log of your progress. Your caregiver can provide you with a simple table to help with this. If you are using the spirometer at home, follow these instructions: SEEK MEDICAL CARE IF:   You are having difficultly using the spirometer.  You have trouble using the spirometer as often as instructed.  Your pain medication is not giving enough relief while using the spirometer.  You develop fever of 100.5 F (38.1 C) or higher. SEEK IMMEDIATE MEDICAL CARE IF:   You cough up bloody sputum that had not been present before.  You develop fever of 102 F (38.9 C) or greater.  You develop worsening pain at or near the incision site. MAKE SURE YOU:   Understand these instructions.  Will watch your condition.  Will get help right away if you are not doing well or get worse. Document Released: 06/13/2006 Document Revised: 04/25/2011 Document Reviewed: 08/14/2006 ExitCare Patient Information 2014 ExitCare, [place].   ________________________________________________________________________  WHAT IS A BLOOD TRANSFUSION? Blood Transfusion Information  A transfusion is the replacement of blood or some of its parts. Blood is made up of multiple cells which provide different functions.  Red blood cells carry oxygen and are used for blood loss replacement.  White blood cells fight against infection.  Platelets control bleeding.  Plasma helps clot blood.  Other blood products are available for specialized needs, such as hemophilia or other clotting disorders. BEFORE THE TRANSFUSION  Who gives blood for transfusions?   Healthy volunteers who are fully evaluated to make sure their blood is safe. This is blood bank blood. Transfusion therapy is the safest it has ever been in the practice of medicine. Before blood is taken from a donor, a complete history  is taken to make sure that person has no history of diseases nor engages in risky social behavior (examples are intravenous drug use or sexual activity with multiple partners). The donor's travel history is screened to minimize risk of transmitting infections, such as malaria. The donated blood is tested for signs of infectious diseases, such as HIV and hepatitis. The blood is then tested to be sure it is compatible with you in order to minimize the chance of a transfusion reaction. If you or a relative donates blood, this is often done in anticipation of surgery and is not appropriate for emergency situations. It takes many days to process the donated blood. RISKS AND COMPLICATIONS Although transfusion therapy is very safe and saves many lives, the main dangers of transfusion include:   Getting an infectious disease.  Developing a transfusion reaction. This is an allergic reaction to something in the blood you were given. Every precaution is taken to prevent this. The decision to have a blood transfusion has been considered carefully by your caregiver before blood is given. Blood is not given unless the benefits outweigh the risks. AFTER THE TRANSFUSION  Right after receiving a blood transfusion, you will usually feel much better and more energetic. This  is especially true if your red blood cells have gotten low (anemic). The transfusion raises the level of the red blood cells which carry oxygen, and this usually causes an energy increase.  The nurse administering the transfusion will monitor you carefully for complications. HOME CARE INSTRUCTIONS  No special instructions are needed after a transfusion. You may find your energy is better. Speak with your caregiver about any limitations on activity for underlying diseases you may have. SEEK MEDICAL CARE IF:   Your condition is not improving after your transfusion.  You develop redness or irritation at the intravenous (IV) site. SEEK IMMEDIATE  MEDICAL CARE IF:  Any of the following symptoms occur over the next 12 hours:  Shaking chills.  You have a temperature by mouth above 102 F (38.9 C), not controlled by medicine.  Chest, back, or muscle pain.  People around you feel you are not acting correctly or are confused.  Shortness of breath or difficulty breathing.  Dizziness and fainting.  You get a rash or develop hives.  You have a decrease in urine output.  Your urine turns a dark color or changes to pink, red, or brown. Any of the following symptoms occur over the next 10 days:  You have a temperature by mouth above 102 F (38.9 C), not controlled by medicine.  Shortness of breath.  Weakness after normal activity.  The white part of the eye turns yellow (jaundice).  You have a decrease in the amount of urine or are urinating less often.  Your urine turns a dark color or changes to pink, red, or brown. Document Released: 01/29/2000 Document Revised: 04/25/2011 Document Reviewed: 09/17/2007 ExitCare Patient Information 2014 Exit[employer], [place].  _______________________________________________________________________    CLEAR LIQUID DIET   Foods Allowed                                                                     Foods Excluded  Coffee and tea, regular and decaf                             liquids that you cannot  Plain Jell-O in any flavor                                             see through such as: Fruit ices (not with fruit pulp)                                     milk, soups, orange juice  Iced Popsicles                                    All solid food Carbonated beverages, regular and diet                                    Cranberry, grape and apple juices Sports drinks like  Gatorade Lightly seasoned clear broth or consume(fat free) Sugar, honey syrup  Sample Menu Breakfast                                Lunch                                     Supper Cranberry juice                     Beef broth                            Chicken broth Jell-O                                     Grape juice                           Apple juice Coffee or tea                        Jell-O                                      Popsicle                                                Coffee or tea                        Coffee or tea  _____________________________________________________________________

## 2015-11-20 NOTE — Progress Notes (Signed)
Clearance dr Hyacinth Meekermiller on chart,chest ekg 9/17 chart, cbc with diff, cmp 10/30/15 on chart

## 2015-11-23 ENCOUNTER — Encounter (HOSPITAL_COMMUNITY): Payer: Self-pay

## 2015-11-23 ENCOUNTER — Encounter (HOSPITAL_COMMUNITY)
Admission: RE | Admit: 2015-11-23 | Discharge: 2015-11-23 | Disposition: A | Discharge status: Home / Self Care | Payer: Medicare Other | Source: Ambulatory Visit | Attending: Orthopedic Surgery | Admitting: Orthopedic Surgery

## 2015-11-23 DIAGNOSIS — Z01812 Encounter for preprocedural laboratory examination: Secondary | ICD-10-CM | POA: Insufficient documentation

## 2015-11-23 DIAGNOSIS — M1711 Unilateral primary osteoarthritis, right knee: Secondary | ICD-10-CM | POA: Insufficient documentation

## 2015-11-23 HISTORY — DX: Anemia, unspecified: D64.9

## 2015-11-23 HISTORY — DX: Angina pectoris, unspecified: I20.9

## 2015-11-23 HISTORY — DX: Other specified postprocedural states: R11.2

## 2015-11-23 HISTORY — DX: Other specified postprocedural states: Z98.890

## 2015-11-23 HISTORY — DX: Cardiac arrhythmia, unspecified: I49.9

## 2015-11-23 LAB — SURGICAL PCR SCREEN
MRSA, PCR: NEGATIVE
Staphylococcus aureus: NEGATIVE

## 2015-11-23 LAB — CBC
HCT: 41.2 % (ref 36.0–46.0)
Hemoglobin: 13.1 g/dL (ref 12.0–15.0)
MCH: 29.4 pg (ref 26.0–34.0)
MCHC: 31.8 g/dL (ref 30.0–36.0)
MCV: 92.4 fL (ref 78.0–100.0)
PLATELETS: 320 10*3/uL (ref 150–400)
RBC: 4.46 MIL/uL (ref 3.87–5.11)
RDW: 13.8 % (ref 11.5–15.5)
WBC: 6.2 10*3/uL (ref 4.0–10.5)

## 2015-11-23 LAB — COMPREHENSIVE METABOLIC PANEL
ALT: 14 U/L (ref 14–54)
AST: 20 U/L (ref 15–41)
Albumin: 4.1 g/dL (ref 3.5–5.0)
Alkaline Phosphatase: 72 U/L (ref 38–126)
Anion gap: 5 (ref 5–15)
BUN: 16 mg/dL (ref 6–20)
CHLORIDE: 102 mmol/L (ref 101–111)
CO2: 28 mmol/L (ref 22–32)
CREATININE: 0.85 mg/dL (ref 0.44–1.00)
Calcium: 9.1 mg/dL (ref 8.9–10.3)
GFR calc non Af Amer: >60 mL/min (ref >60)
Glucose, Bld: 80 mg/dL (ref 65–99)
Potassium: 4.3 mmol/L (ref 3.5–5.1)
SODIUM: 135 mmol/L (ref 135–145)
Total Bilirubin: 1 mg/dL (ref 0.3–1.2)
Total Protein: 7.2 g/dL (ref 6.5–8.1)

## 2015-11-23 LAB — ABO/RH: ABO/RH(D): O POS

## 2015-11-23 LAB — URINALYSIS, ROUTINE W REFLEX MICROSCOPIC
Bilirubin Urine: NEGATIVE
GLUCOSE, UA: NEGATIVE mg/dL
HGB URINE DIPSTICK: NEGATIVE
KETONES UR: NEGATIVE mg/dL
LEUKOCYTES UA: NEGATIVE
Nitrite: NEGATIVE
PROTEIN: NEGATIVE mg/dL
Specific Gravity, Urine: 1.006 (ref 1.005–1.030)
pH: 6 (ref 5.0–8.0)

## 2015-11-23 LAB — APTT: APTT: 30 s (ref 24–36)

## 2015-11-23 LAB — PROTIME-INR
INR: 1.07
Prothrombin Time: 13.9 seconds (ref 11.4–15.2)

## 2015-11-29 ENCOUNTER — Ambulatory Visit: Payer: Self-pay | Admitting: Orthopedic Surgery

## 2015-11-29 NOTE — H&P (Signed)
Nicole GarnetLinda J Graves DOB: 1945/10/08 Divorced / Language: [place] / Race: White Female Date of Admission:  11/30/2015 CC:  Right Knee Pain History of Present Illness The patient is a 70 year old female who comes in for a preoperative History and Physical. The patient is scheduled for a right total knee arthroplasty to be performed by Dr. Gus RankinFrank V. Aluisio, [facility]/30/[facility]. The patient is a 70 year old female who presented for follow up of their knee. The patient is being followed for their right (worse than left) knee pain and osteoarthritis. They are now months out from cortisone injections. Symptoms reported include: pain, swelling, grinding, giving way, instability and difficulty ambulating. The patient feels that they are doing poorly. Current treatment includes: home exercise program (She was previously evaluated at [facility], and was given some exercises to do at home. She states that she has not been consistent with her exercises). The following medication has been used for pain control: Tramadol. The patient has reported symptom improvement with: Cortisone injections (last injections were not as beneficial as previous injections) while they have not gotten any relief of their symptoms with: bracing or viscosupplementation. She had a recent series of visco at the [facility]. She was also given a custom brace, that she reports is more cumbersome to wear than it is beneficial. Unfortunately, her knees are getting progressively worse over time. Right knee is far more symptomatic than the left. She is at a stage now where she is ready to get it fixed. She has had severe pain for a long time and now, her function is getting worse. The injections at [facility] did not help her at all. Cortisone has helped some in the past. She is over for cortisone injection today just to help get her through the summer. She is ready to get her right knee replaced. They have been  treated conservatively in the past for the above stated problem and despite conservative measures, they continue to have progressive pain and severe functional limitations and dysfunction. They have failed non-operative management including home exercise, medications, and injections. It is felt that they would benefit from undergoing total joint replacement. Risks and benefits of the procedure have been discussed with the patient and they elect to proceed with surgery. There are no active contraindications to surgery such as ongoing infection or rapidly progressive neurological disease.  Problem List/Past Medical Chronic pain of right knee (M25.561)  Primary osteoarthritis of both knees (M17.0)  Knee injury, right, initial encounter (J47.82NF(S89.91XA)  Vertigo  Depression  Chronic Pain  Ulcer disease  Heart murmur  Cataract  Pneumonia  Arrhythmia  Intermittent Gastroesophageal Reflux Disease  Gastric Ulcer  Past History Hemorrhoids  Chronic fatigue syndrome  Menopause   Allergies  Sulfanilamide *CHEMICALS*  Nausea, Vomiting.  Family History  Heart disease in female family member before age 72  Hypertension  Mother. Osteoarthritis  Brother, Mother, Sister. Heart disease in female family member before age 72  Osteoporosis  Sister. First Degree Relatives  reported Cancer  Brother, Mother. Depression  Mother, Sister. Chronic Obstructive Lung Disease  Mother. Congestive Heart Failure  Mother. Diabetes Mellitus  Sister.  Social History Marital status  divorced Current work status  retired Tobacco use  Never smoker. 03/28/2013 Current drinker  03/28/2013: Currently drinks only occasionally per week Children  2 Living situation  live alone Number of flights of stairs before winded  1 No history of drug/alcohol rehab  Under pain contract  Exercise  Exercises never; does other Tobacco / smoke exposure  03/28/2013: no Post-Surgical Plans  Home with  Daughter Advance Directives  Living Will  Medication History  Voltaren (50MG  Tablet DR, Oral) Active. TraMADol HCl (50MG  Tablet, Oral) Active. Metoprolol Succinate ER (25MG  Tablet ER 24HR, Oral) Active. Melatonin (1MG  Tablet, Oral) Active. Cyclobenzaprine HCl (10MG  Tablet, Oral) Active. Atorvastatin Calcium (10MG  Tablet, Oral) Active. [name] (40MG  Tablet, Oral) Active. Cymbalta (30MG  Capsule DR Part, Oral) Active. BuPROPion HCl ER (XL) (300MG  Tablet ER 24HR, Oral) Active. Claritin (10MG  Tablet, Oral) Active. (generic) Vitamin D (2000UNIT Tablet, Oral) Active. Omeprazole (20MG  Capsule ER, Oral) Active. CeleBREX (200MG  Capsule, Oral) Active.  Past Surgical History Tubal Ligation  Gallbladder Surgery  Date: 1968. open Cataract Surgery  Date: 2012. bilateral Arthroscopy of Knee  right Colon Polyp Removal - Colonoscopy  Cesarean Delivery  2 times; 1967 and 1982  Review of Systems General Present- Fatigue. Not Present- Chills, Fever, Memory Loss, Night Sweats, Weight Gain and Weight Loss. Skin Not Present- Eczema, Hives, Itching, Lesions and Rash. HEENT Not Present- Dentures, Double Vision, Headache, Hearing Loss, Tinnitus and Visual Loss. Respiratory Present- Shortness of breath with exertion. Not Present- Allergies, Chronic Cough, Coughing up blood and Shortness of breath at rest. Cardiovascular Not Present- Chest Pain, Difficulty Breathing Lying Down, Murmur, Palpitations, Racing/skipping heartbeats and Swelling. Gastrointestinal Present- Constipation and Heartburn. Not Present- Abdominal Pain, Bloody Stool, Diarrhea, Difficulty Swallowing, Jaundice, Loss of appetitie, Nausea and Vomiting. Female Genitourinary Present- Urinating at Night. Not Present- Blood in Urine, Discharge, Flank Pain, Incontinence, Painful Urination, Urgency, Urinary frequency, Urinary Retention and Weak urinary stream. Musculoskeletal Present- Back Pain, Joint Pain, Joint Swelling, Morning  Stiffness, Muscle Pain and Muscle Weakness. Not Present- Spasms. Neurological Not Present- Blackout spells, Difficulty with balance, Dizziness, Paralysis, Tremor and Weakness. Psychiatric Not Present- Insomnia.  Vitals Weight: 198 lb Height: 64in Body Surface Area: 1.95 m Body Mass Index: 33.99 kg/m  Pulse: 76 (Regular)  BP: 132/74 (Sitting, Right Arm, Standard)  Physical Exam General Mental Status -Alert, cooperative and good historian. General Appearance-pleasant, Not in acute distress. Orientation-Oriented X3. Build & Nutrition-Well nourished and Well developed.  Head and Neck Head-normocephalic, atraumatic . Neck Global Assessment - supple, no bruit auscultated on the right, no bruit auscultated on the left.  Eye Pupil - Bilateral-Regular and Round. Motion - Bilateral-EOMI.  ENMT Note: upper and lower partial denture plates   Chest and Lung Exam Auscultation Breath sounds - clear at anterior chest wall and clear at posterior chest wall. Adventitious sounds - No Adventitious sounds.  Cardiovascular Auscultation Rhythm - Regular rate and rhythm. Heart Sounds - S1 WNL and S2 WNL. Murmurs & Other Heart Sounds - Auscultation of the heart reveals - No Murmurs.  Abdomen Palpation/Percussion Tenderness - Abdomen is non-tender to palpation. Rigidity (guarding) - Abdomen is soft. Auscultation Auscultation of the abdomen reveals - Bowel sounds normal.  Female Genitourinary Note: Not done, not pertinent to present illness   Musculoskeletal Note: On exam, she is in no distress. Her right knee shows trace effusion. I can flex her down about 50 and she has full extension. She is slightly tender laterally. There is no instability.   Assessment & Plan Primary osteoarthritis of right knee (M17.11) Primary osteoarthritis of left knee (M17.12)  Note:Surgical Plans: Right Total Knee Replacement  Disposition: Home with daughter  PCP: Dr.  Hyacinth Meeker - Patient has been seen preoperatively and felt to be stable for surgery. Preop Labs, CXR, and EKG performed in therir office on 11/03/2015 HGB 12.7  Cards: Dr. [name]  IV TXA  Anesthesia Issues: None  Signed electronically by Beckey Rutter, III PA-C

## 2015-11-30 ENCOUNTER — Encounter (HOSPITAL_COMMUNITY)
Admission: RE | Disposition: A | Discharge status: Home Health | Payer: Self-pay | Source: Ambulatory Visit | Attending: Orthopedic Surgery

## 2015-11-30 ENCOUNTER — Inpatient Hospital Stay (HOSPITAL_COMMUNITY): Payer: Medicare Other | Admitting: Anesthesiology

## 2015-11-30 ENCOUNTER — Inpatient Hospital Stay (HOSPITAL_COMMUNITY)
Admission: RE | Admit: 2015-11-30 | Discharge: 2015-12-03 | DRG: 470 | Disposition: A | Discharge status: Home Health | Payer: Medicare Other | Source: Ambulatory Visit | Attending: Orthopedic Surgery | Admitting: Orthopedic Surgery

## 2015-11-30 ENCOUNTER — Encounter (HOSPITAL_COMMUNITY): Payer: Self-pay | Admitting: *Deleted

## 2015-11-30 DIAGNOSIS — K219 Gastro-esophageal reflux disease without esophagitis: Secondary | ICD-10-CM | POA: Diagnosis present

## 2015-11-30 DIAGNOSIS — I951 Orthostatic hypotension: Secondary | ICD-10-CM

## 2015-11-30 DIAGNOSIS — Z79899 Other long term (current) drug therapy: Secondary | ICD-10-CM

## 2015-11-30 DIAGNOSIS — M1710 Unilateral primary osteoarthritis, unspecified knee: Secondary | ICD-10-CM | POA: Diagnosis present

## 2015-11-30 DIAGNOSIS — M179 Osteoarthritis of knee, unspecified: Secondary | ICD-10-CM | POA: Diagnosis present

## 2015-11-30 DIAGNOSIS — Z86010 Personal history of colonic polyps: Secondary | ICD-10-CM | POA: Diagnosis not present

## 2015-11-30 DIAGNOSIS — Z888 Allergy status to other drugs, medicaments and biological substances status: Secondary | ICD-10-CM | POA: Diagnosis not present

## 2015-11-30 DIAGNOSIS — G8929 Other chronic pain: Secondary | ICD-10-CM | POA: Diagnosis not present

## 2015-11-30 DIAGNOSIS — M1711 Unilateral primary osteoarthritis, right knee: Secondary | ICD-10-CM | POA: Diagnosis not present

## 2015-11-30 DIAGNOSIS — F329 Major depressive disorder, single episode, unspecified: Secondary | ICD-10-CM | POA: Diagnosis present

## 2015-11-30 DIAGNOSIS — R000 Tachycardia, unspecified: Secondary | ICD-10-CM | POA: Diagnosis not present

## 2015-11-30 DIAGNOSIS — I351 Nonrheumatic aortic (valve) insufficiency: Secondary | ICD-10-CM | POA: Diagnosis present

## 2015-11-30 DIAGNOSIS — M1712 Unilateral primary osteoarthritis, left knee: Secondary | ICD-10-CM | POA: Diagnosis not present

## 2015-11-30 DIAGNOSIS — R5382 Chronic fatigue, unspecified: Secondary | ICD-10-CM | POA: Diagnosis present

## 2015-11-30 DIAGNOSIS — M545 Low back pain: Secondary | ICD-10-CM | POA: Diagnosis present

## 2015-11-30 HISTORY — DX: Other chronic pain: G89.29

## 2015-11-30 HISTORY — DX: Low back pain: M54.5

## 2015-11-30 HISTORY — DX: Low back pain, unspecified: M54.50

## 2015-11-30 HISTORY — PX: TOTAL KNEE ARTHROPLASTY: SHX125

## 2015-11-30 LAB — TYPE AND SCREEN
ABO/RH(D): O POS
Antibody Screen: NEGATIVE

## 2015-11-30 SURGERY — ARTHROPLASTY, KNEE, TOTAL
Anesthesia: General | Site: Knee | Laterality: Right

## 2015-11-30 MED ORDER — HYDROMORPHONE HCL 1 MG/ML IJ SOLN: 0.2500 mg | INTRAMUSCULAR | Status: DC | PRN

## 2015-11-30 MED ORDER — FENTANYL CITRATE (PF) 100 MCG/2ML IJ SOLN
INTRAMUSCULAR | Status: DC | PRN
  Administered 2015-11-30: 50 ug via INTRAVENOUS
  Administered 2015-11-30: 100 ug via INTRAVENOUS
  Administered 2015-11-30: 50 ug via INTRAVENOUS

## 2015-11-30 MED ORDER — METOCLOPRAMIDE HCL 5 MG PO TABS: 5.0000 mg | ORAL_TABLET | Freq: Three times a day (TID) | ORAL | Status: DC | PRN

## 2015-11-30 MED ORDER — DEXAMETHASONE SODIUM PHOSPHATE 10 MG/ML IJ SOLN
10.0000 mg | Freq: Once | INTRAMUSCULAR | Status: AC
  Administered 2015-11-30: 10 mg via INTRAVENOUS

## 2015-11-30 MED ORDER — RIVAROXABAN 10 MG PO TABS
10.0000 mg | ORAL_TABLET | Freq: Every day | ORAL | Status: DC
  Administered 2015-12-01 – 2015-12-03 (×3): 10 mg via ORAL
  Filled 2015-11-30 (×3): qty 1

## 2015-11-30 MED ORDER — TRANEXAMIC ACID 1000 MG/10ML IV SOLN
1000.0000 mg | INTRAVENOUS | Status: AC
  Administered 2015-11-30: 1000 mg via INTRAVENOUS
  Filled 2015-11-30: qty 1100

## 2015-11-30 MED ORDER — BUPIVACAINE LIPOSOME 1.3 % IJ SUSP
INTRAMUSCULAR | Status: DC | PRN
  Administered 2015-11-30: 20 mL

## 2015-11-30 MED ORDER — METHOCARBAMOL 500 MG PO TABS
500.0000 mg | ORAL_TABLET | Freq: Four times a day (QID) | ORAL | Status: DC | PRN
  Administered 2015-12-01 – 2015-12-03 (×7): 500 mg via ORAL
  Filled 2015-11-30 (×7): qty 1

## 2015-11-30 MED ORDER — ACETAMINOPHEN 325 MG PO TABS: 650.0000 mg | ORAL_TABLET | Freq: Four times a day (QID) | ORAL | Status: DC | PRN

## 2015-11-30 MED ORDER — CEFAZOLIN SODIUM-DEXTROSE 2-4 GM/100ML-% IV SOLN
INTRAVENOUS | Status: AC
  Filled 2015-11-30: qty 100

## 2015-11-30 MED ORDER — METHOCARBAMOL 1000 MG/10ML IJ SOLN
500.0000 mg | Freq: Four times a day (QID) | INTRAVENOUS | Status: DC | PRN
  Administered 2015-11-30: 500 mg via INTRAVENOUS
  Filled 2015-11-30: qty 550
  Filled 2015-11-30: qty 5

## 2015-11-30 MED ORDER — FENTANYL CITRATE (PF) 100 MCG/2ML IJ SOLN
INTRAMUSCULAR | Status: AC
  Filled 2015-11-30: qty 2

## 2015-11-30 MED ORDER — ONDANSETRON HCL 4 MG/2ML IJ SOLN
INTRAMUSCULAR | Status: DC | PRN
  Administered 2015-11-30: 4 mg via INTRAVENOUS

## 2015-11-30 MED ORDER — DEXAMETHASONE SODIUM PHOSPHATE 10 MG/ML IJ SOLN
INTRAMUSCULAR | Status: DC | PRN
  Administered 2015-11-30: 10 mg via INTRAVENOUS

## 2015-11-30 MED ORDER — LIDOCAINE 2% (20 MG/ML) 5 ML SYRINGE
INTRAMUSCULAR | Status: DC | PRN
  Administered 2015-11-30: 100 mg via INTRAVENOUS

## 2015-11-30 MED ORDER — TRANEXAMIC ACID 1000 MG/10ML IV SOLN
1000.0000 mg | Freq: Once | INTRAVENOUS | Status: AC
  Administered 2015-11-30: 1000 mg via INTRAVENOUS
  Filled 2015-11-30: qty 10

## 2015-11-30 MED ORDER — SUCCINYLCHOLINE CHLORIDE 200 MG/10ML IV SOSY
PREFILLED_SYRINGE | INTRAVENOUS | Status: DC | PRN
  Administered 2015-11-30: 100 mg via INTRAVENOUS

## 2015-11-30 MED ORDER — LIDOCAINE 2% (20 MG/ML) 5 ML SYRINGE
INTRAMUSCULAR | Status: AC
  Filled 2015-11-30: qty 5

## 2015-11-30 MED ORDER — ONDANSETRON HCL 4 MG/2ML IJ SOLN
INTRAMUSCULAR | Status: AC
  Filled 2015-11-30: qty 2

## 2015-11-30 MED ORDER — DEXAMETHASONE SODIUM PHOSPHATE 10 MG/ML IJ SOLN
INTRAMUSCULAR | Status: AC
  Filled 2015-11-30: qty 1

## 2015-11-30 MED ORDER — FLEET ENEMA 7-19 GM/118ML RE ENEM: 1.0000 | ENEMA | Freq: Once | RECTAL | Status: DC | PRN

## 2015-11-30 MED ORDER — PROMETHAZINE HCL 25 MG/ML IJ SOLN
INTRAMUSCULAR | Status: AC
  Filled 2015-11-30: qty 1

## 2015-11-30 MED ORDER — ATORVASTATIN CALCIUM 10 MG PO TABS
10.0000 mg | ORAL_TABLET | Freq: Every day | ORAL | Status: DC
  Administered 2015-11-30 – 2015-12-02 (×3): 10 mg via ORAL
  Filled 2015-11-30 (×3): qty 1

## 2015-11-30 MED ORDER — SUGAMMADEX SODIUM 200 MG/2ML IV SOLN
INTRAVENOUS | Status: DC | PRN
  Administered 2015-11-30: 200 mg via INTRAVENOUS

## 2015-11-30 MED ORDER — PROMETHAZINE HCL 25 MG/ML IJ SOLN: 6.2500 mg | INTRAMUSCULAR | Status: DC | PRN

## 2015-11-30 MED ORDER — LACTATED RINGERS IV SOLN
INTRAVENOUS | Status: DC
  Administered 2015-11-30: 1000 mL via INTRAVENOUS
  Administered 2015-11-30: 14:00:00 via INTRAVENOUS

## 2015-11-30 MED ORDER — DIPHENHYDRAMINE HCL 12.5 MG/5ML PO ELIX
12.5000 mg | ORAL_SOLUTION | ORAL | Status: DC | PRN
Start: 2015-11-30 — End: 2015-12-03

## 2015-11-30 MED ORDER — PANTOPRAZOLE SODIUM 40 MG PO TBEC
40.0000 mg | DELAYED_RELEASE_TABLET | Freq: Every day | ORAL | Status: DC
  Administered 2015-12-01: 40 mg via ORAL
  Filled 2015-11-30: qty 1

## 2015-11-30 MED ORDER — METOCLOPRAMIDE HCL 5 MG/ML IJ SOLN
5.0000 mg | Freq: Three times a day (TID) | INTRAMUSCULAR | Status: DC | PRN
Start: 2015-11-30 — End: 2015-12-03

## 2015-11-30 MED ORDER — CEFAZOLIN SODIUM-DEXTROSE 2-4 GM/100ML-% IV SOLN
2.0000 g | INTRAVENOUS | Status: AC
  Administered 2015-11-30: 2 g via INTRAVENOUS

## 2015-11-30 MED ORDER — ACETAMINOPHEN 650 MG RE SUPP: 650.0000 mg | Freq: Four times a day (QID) | RECTAL | Status: DC | PRN

## 2015-11-30 MED ORDER — STERILE WATER FOR IRRIGATION IR SOLN
Status: DC | PRN
  Administered 2015-11-30: 3000 mL

## 2015-11-30 MED ORDER — PROMETHAZINE HCL 25 MG/ML IJ SOLN
6.2500 mg | INTRAMUSCULAR | Status: AC | PRN
  Administered 2015-11-30 (×2): 12.5 mg via INTRAVENOUS

## 2015-11-30 MED ORDER — MENTHOL 3 MG MT LOZG
1.0000 | LOZENGE | OROMUCOSAL | Status: DC | PRN
  Administered 2015-12-02: 3 mg via ORAL
  Filled 2015-11-30: qty 9

## 2015-11-30 MED ORDER — BUPIVACAINE LIPOSOME 1.3 % IJ SUSP
20.0000 mL | Freq: Once | INTRAMUSCULAR | Status: DC
  Filled 2015-11-30: qty 20

## 2015-11-30 MED ORDER — CYCLOBENZAPRINE HCL 10 MG PO TABS
10.0000 mg | ORAL_TABLET | Freq: Every day | ORAL | Status: DC
  Administered 2015-11-30 – 2015-12-02 (×3): 10 mg via ORAL
  Filled 2015-11-30 (×3): qty 1

## 2015-11-30 MED ORDER — BUPIVACAINE HCL (PF) 0.25 % IJ SOLN
INTRAMUSCULAR | Status: AC
  Filled 2015-11-30: qty 30

## 2015-11-30 MED ORDER — SODIUM CHLORIDE 0.9 % IJ SOLN
INTRAMUSCULAR | Status: DC | PRN
  Administered 2015-11-30: 20 mL via INTRAVENOUS

## 2015-11-30 MED ORDER — CEFAZOLIN SODIUM-DEXTROSE 2-4 GM/100ML-% IV SOLN
2.0000 g | Freq: Four times a day (QID) | INTRAVENOUS | Status: AC
  Administered 2015-11-30 – 2015-12-01 (×2): 2 g via INTRAVENOUS
  Filled 2015-11-30 (×2): qty 100

## 2015-11-30 MED ORDER — DEXAMETHASONE SODIUM PHOSPHATE 10 MG/ML IJ SOLN
10.0000 mg | Freq: Once | INTRAMUSCULAR | Status: AC
  Administered 2015-12-01: 10 mg via INTRAVENOUS
  Filled 2015-11-30: qty 1

## 2015-11-30 MED ORDER — MIDAZOLAM HCL 2 MG/2ML IJ SOLN
INTRAMUSCULAR | Status: AC
  Filled 2015-11-30: qty 2

## 2015-11-30 MED ORDER — ACETAMINOPHEN 500 MG PO TABS
1000.0000 mg | ORAL_TABLET | Freq: Four times a day (QID) | ORAL | Status: AC
  Administered 2015-11-30: 1000 mg via ORAL
  Administered 2015-12-01: 500 mg via ORAL
  Administered 2015-12-01 (×2): 1000 mg via ORAL
  Filled 2015-11-30 (×4): qty 2

## 2015-11-30 MED ORDER — HYDROMORPHONE HCL 1 MG/ML IJ SOLN
INTRAMUSCULAR | Status: DC | PRN
  Administered 2015-11-30 (×2): 1 mg via INTRAVENOUS

## 2015-11-30 MED ORDER — SUCCINYLCHOLINE CHLORIDE 20 MG/ML IJ SOLN
INTRAMUSCULAR | Status: AC
  Filled 2015-11-30: qty 1

## 2015-11-30 MED ORDER — 0.9 % SODIUM CHLORIDE (POUR BTL) OPTIME
TOPICAL | Status: DC | PRN
  Administered 2015-11-30: 1000 mL

## 2015-11-30 MED ORDER — SODIUM CHLORIDE 0.9 % IJ SOLN
INTRAMUSCULAR | Status: AC
  Filled 2015-11-30: qty 50

## 2015-11-30 MED ORDER — HYDROMORPHONE HCL 2 MG/ML IJ SOLN
INTRAMUSCULAR | Status: AC
  Filled 2015-11-30: qty 1

## 2015-11-30 MED ORDER — ACETAMINOPHEN 10 MG/ML IV SOLN
INTRAVENOUS | Status: AC
  Filled 2015-11-30: qty 100

## 2015-11-30 MED ORDER — MORPHINE SULFATE (PF) 2 MG/ML IV SOLN
1.0000 mg | INTRAVENOUS | Status: DC | PRN
  Administered 2015-11-30 (×2): 1 mg via INTRAVENOUS
  Filled 2015-11-30 (×2): qty 1

## 2015-11-30 MED ORDER — PROPOFOL 10 MG/ML IV BOLUS
INTRAVENOUS | Status: AC
  Filled 2015-11-30: qty 40

## 2015-11-30 MED ORDER — PHENOL 1.4 % MT LIQD
1.0000 | OROMUCOSAL | Status: DC | PRN
  Filled 2015-11-30: qty 177

## 2015-11-30 MED ORDER — METOPROLOL SUCCINATE ER 25 MG PO TB24
25.0000 mg | ORAL_TABLET | Freq: Every day | ORAL | Status: DC
  Administered 2015-11-30 – 2015-12-02 (×3): 25 mg via ORAL
  Filled 2015-11-30 (×4): qty 1

## 2015-11-30 MED ORDER — POLYETHYLENE GLYCOL 3350 17 G PO PACK: 17.0000 g | PACK | Freq: Every day | ORAL | Status: DC | PRN

## 2015-11-30 MED ORDER — LORATADINE 10 MG PO TABS
10.0000 mg | ORAL_TABLET | Freq: Every day | ORAL | Status: DC
  Administered 2015-12-01 – 2015-12-03 (×3): 10 mg via ORAL
  Filled 2015-11-30 (×3): qty 1

## 2015-11-30 MED ORDER — BUPIVACAINE HCL 0.25 % IJ SOLN
INTRAMUSCULAR | Status: DC | PRN
  Administered 2015-11-30: 20 mL

## 2015-11-30 MED ORDER — SODIUM CHLORIDE 0.9 % IR SOLN
Status: DC | PRN
Start: 2015-11-30 — End: 2015-11-30
  Administered 2015-11-30: 1000 mL

## 2015-11-30 MED ORDER — BUPROPION HCL ER (XL) 300 MG PO TB24
300.0000 mg | ORAL_TABLET | Freq: Every day | ORAL | Status: DC
  Administered 2015-12-01 – 2015-12-03 (×3): 300 mg via ORAL
  Filled 2015-11-30 (×3): qty 1

## 2015-11-30 MED ORDER — DOCUSATE SODIUM 100 MG PO CAPS
100.0000 mg | ORAL_CAPSULE | Freq: Two times a day (BID) | ORAL | Status: DC
  Administered 2015-11-30 – 2015-12-02 (×5): 100 mg via ORAL
  Filled 2015-11-30 (×6): qty 1

## 2015-11-30 MED ORDER — OXYCODONE HCL 5 MG PO TABS
5.0000 mg | ORAL_TABLET | ORAL | Status: DC | PRN
  Administered 2015-11-30: 5 mg via ORAL
  Administered 2015-12-01 (×3): 10 mg via ORAL
  Administered 2015-12-02: 5 mg via ORAL
  Administered 2015-12-02 – 2015-12-03 (×6): 10 mg via ORAL
  Filled 2015-11-30: qty 2
  Filled 2015-11-30 (×2): qty 1
  Filled 2015-11-30 (×8): qty 2

## 2015-11-30 MED ORDER — SUGAMMADEX SODIUM 200 MG/2ML IV SOLN
INTRAVENOUS | Status: AC
  Filled 2015-11-30: qty 2

## 2015-11-30 MED ORDER — MIDAZOLAM HCL 5 MG/5ML IJ SOLN
INTRAMUSCULAR | Status: DC | PRN
  Administered 2015-11-30: 2 mg via INTRAVENOUS

## 2015-11-30 MED ORDER — PROPOFOL 10 MG/ML IV BOLUS
INTRAVENOUS | Status: DC | PRN
  Administered 2015-11-30: 180 mg via INTRAVENOUS

## 2015-11-30 MED ORDER — PHENYLEPHRINE 40 MCG/ML (10ML) SYRINGE FOR IV PUSH (FOR BLOOD PRESSURE SUPPORT)
PREFILLED_SYRINGE | INTRAVENOUS | Status: AC
  Filled 2015-11-30: qty 10

## 2015-11-30 MED ORDER — ONDANSETRON HCL 4 MG/2ML IJ SOLN: 4.0000 mg | Freq: Four times a day (QID) | INTRAMUSCULAR | Status: DC | PRN

## 2015-11-30 MED ORDER — DULOXETINE HCL 60 MG PO CPEP
90.0000 mg | ORAL_CAPSULE | Freq: Every day | ORAL | Status: DC
  Administered 2015-12-01 – 2015-12-03 (×3): 90 mg via ORAL
  Filled 2015-11-30 (×3): qty 1

## 2015-11-30 MED ORDER — ROCURONIUM BROMIDE 10 MG/ML (PF) SYRINGE
PREFILLED_SYRINGE | INTRAVENOUS | Status: DC | PRN
  Administered 2015-11-30: 30 mg via INTRAVENOUS

## 2015-11-30 MED ORDER — ACETAMINOPHEN 10 MG/ML IV SOLN
1000.0000 mg | Freq: Once | INTRAVENOUS | Status: AC
  Administered 2015-11-30: 1000 mg via INTRAVENOUS

## 2015-11-30 MED ORDER — ONDANSETRON HCL 4 MG/2ML IJ SOLN
4.0000 mg | Freq: Once | INTRAMUSCULAR | Status: AC
  Administered 2015-11-30: 4 mg via INTRAVENOUS

## 2015-11-30 MED ORDER — SODIUM CHLORIDE 0.9 % IV SOLN
INTRAVENOUS | Status: DC
  Administered 2015-11-30 – 2015-12-01 (×2): via INTRAVENOUS

## 2015-11-30 MED ORDER — BISACODYL 10 MG RE SUPP: 10.0000 mg | Freq: Every day | RECTAL | Status: DC | PRN

## 2015-11-30 MED ORDER — ONDANSETRON HCL 4 MG PO TABS: 4.0000 mg | ORAL_TABLET | Freq: Four times a day (QID) | ORAL | Status: DC | PRN

## 2015-11-30 MED ORDER — LURASIDONE HCL 40 MG PO TABS
40.0000 mg | ORAL_TABLET | Freq: Every day | ORAL | Status: DC
  Administered 2015-11-30 – 2015-12-02 (×3): 40 mg via ORAL
  Filled 2015-11-30 (×3): qty 1

## 2015-11-30 SURGICAL SUPPLY — 49 items
BAG DECANTER FOR FLEXI CONT (MISCELLANEOUS) ×1 IMPLANT
BAG SPEC THK2 15X12 ZIP CLS (MISCELLANEOUS) ×1
BAG ZIPLOCK 12X15 (MISCELLANEOUS) ×2 IMPLANT
BANDAGE ACE 6X5 VEL STRL LF (GAUZE/BANDAGES/DRESSINGS) ×2 IMPLANT
BLADE SAG 18X100X1.27 (BLADE) ×2 IMPLANT
BLADE SAW SGTL 11.0X1.19X90.0M (BLADE) ×2 IMPLANT
BOWL SMART MIX CTS (DISPOSABLE) ×2 IMPLANT
CAP KNEE TOTAL 3 SIGMA ×1 IMPLANT
CEMENT HV SMART SET (Cement) ×4 IMPLANT
CLOTH BEACON ORANGE TIMEOUT ST (SAFETY) ×2 IMPLANT
CUFF TOURN SGL QUICK 34 (TOURNIQUET CUFF) ×2
CUFF TRNQT CYL 34X4X40X1 (TOURNIQUET CUFF) ×1 IMPLANT
DECANTER SPIKE VIAL GLASS SM (MISCELLANEOUS) ×2 IMPLANT
DRAPE U-SHAPE 47X51 STRL (DRAPES) ×2 IMPLANT
DRSG ADAPTIC 3X8 NADH LF (GAUZE/BANDAGES/DRESSINGS) ×2 IMPLANT
DRSG PAD ABDOMINAL 8X10 ST (GAUZE/BANDAGES/DRESSINGS) ×2 IMPLANT
DURAPREP 26ML APPLICATOR (WOUND CARE) ×2 IMPLANT
ELECT REM PT RETURN 9FT ADLT (ELECTROSURGICAL) ×2
ELECTRODE REM PT RTRN 9FT ADLT (ELECTROSURGICAL) ×1 IMPLANT
EVACUATOR 1/8 PVC DRAIN (DRAIN) ×2 IMPLANT
GAUZE SPONGE 4X4 12PLY STRL (GAUZE/BANDAGES/DRESSINGS) ×2 IMPLANT
GLOVE BIO SURGEON STRL SZ7.5 (GLOVE) IMPLANT
GLOVE BIO SURGEON STRL SZ8 (GLOVE) ×2 IMPLANT
GLOVE BIOGEL PI IND STRL 6.5 (GLOVE) IMPLANT
GLOVE BIOGEL PI IND STRL 8 (GLOVE) ×1 IMPLANT
GLOVE BIOGEL PI INDICATOR 6.5 (GLOVE)
GLOVE BIOGEL PI INDICATOR 8 (GLOVE) ×1
GLOVE SURG SS PI 6.5 STRL IVOR (GLOVE) ×6 IMPLANT
GOWN STRL REUS W/TWL LRG LVL3 (GOWN DISPOSABLE) ×3 IMPLANT
GOWN STRL REUS W/TWL XL LVL3 (GOWN DISPOSABLE) ×2 IMPLANT
HANDPIECE INTERPULSE COAX TIP (DISPOSABLE) ×2
IMMOBILIZER KNEE 20 (SOFTGOODS) ×2
IMMOBILIZER KNEE 20 THIGH 36 (SOFTGOODS) ×1 IMPLANT
MANIFOLD NEPTUNE II (INSTRUMENTS) ×2 IMPLANT
NS IRRIG 1000ML POUR BTL (IV SOLUTION) ×2 IMPLANT
PACK TOTAL KNEE CUSTOM (KITS) ×2 IMPLANT
PADDING CAST COTTON 6X4 STRL (CAST SUPPLIES) ×4 IMPLANT
POSITIONER SURGICAL ARM (MISCELLANEOUS) ×2 IMPLANT
SET HNDPC FAN SPRY TIP SCT (DISPOSABLE) ×1 IMPLANT
STRIP CLOSURE SKIN 1/2X4 (GAUZE/BANDAGES/DRESSINGS) ×3 IMPLANT
SUT MNCRL AB 4-0 PS2 18 (SUTURE) ×2 IMPLANT
SUT VIC AB 2-0 CT1 27 (SUTURE) ×6
SUT VIC AB 2-0 CT1 TAPERPNT 27 (SUTURE) ×3 IMPLANT
SUT VLOC 180 0 24IN GS25 (SUTURE) ×2 IMPLANT
SYR 50ML LL SCALE MARK (SYRINGE) ×2 IMPLANT
TRAY FOLEY W/METER SILVER 16FR (SET/KITS/TRAYS/PACK) ×2 IMPLANT
WATER STERILE IRR 1500ML POUR (IV SOLUTION) ×2 IMPLANT
WRAP KNEE MAXI GEL POST OP (GAUZE/BANDAGES/DRESSINGS) ×2 IMPLANT
YANKAUER SUCT BULB TIP 10FT TU (MISCELLANEOUS) ×2 IMPLANT

## 2015-11-30 NOTE — Transfer of Care (Signed)
Immediate Anesthesia Transfer of Care Note  Patient: Dalbert GarnetLinda J Gressman  Procedure(s) Performed: Procedure(s): RIGHT TOTAL KNEE ARTHROPLASTY (Right)  Patient Location: PACU  Anesthesia Type:General  Level of Consciousness: awake  Airway & Oxygen Therapy: Patient Spontanous Breathing and Patient connected to face mask oxygen  Post-op Assessment: Report given to RN and Post -op Vital signs reviewed and stable  Post vital signs: Reviewed and stable  Last Vitals:  Vitals:   11/30/15 1009  BP: (!) 139/93  Pulse: 88  Resp: 18  Temp: 36.6 C    Last Pain:  Vitals:   11/30/15 1010  TempSrc:   PainSc: 4       Patients Stated Pain Goal: 4 (11/30/15 1010)  Complications: No apparent anesthesia complications

## 2015-11-30 NOTE — Interval H&P Note (Signed)
History and Physical Interval Note:  11/30/2015 1:20 PM  Nicole GarnetLinda J Nelms  has presented today for surgery, with the diagnosis of RIGHT KNEE OA  The various methods of treatment have been discussed with the patient and family. After consideration of risks, benefits and other options for treatment, the patient has consented to  Procedure(s): RIGHT TOTAL KNEE ARTHROPLASTY (Right) as a surgical intervention .  The patient's history has been reviewed, patient examined, no change in status, stable for surgery.  I have reviewed the patient's chart and labs.  Questions were answered to the patient's satisfaction.     Loanne DrillingALUISIO,Yuli Lanigan V

## 2015-11-30 NOTE — Anesthesia Preprocedure Evaluation (Signed)
Anesthesia Evaluation  Patient identified by MRN, date of birth, ID band Patient awake    Reviewed: Allergy & Precautions, NPO status , Patient's Chart, lab work & pertinent test results  History of Anesthesia Complications (+) PONV  Airway Mallampati: II  TM Distance: >3 FB Neck ROM: Full    Dental   Pulmonary    breath sounds clear to auscultation       Cardiovascular + angina + dysrhythmias + Valvular Problems/Murmurs AI and AS  Rhythm:Regular Rate:Normal + Systolic murmurs and + Diastolic murmurs Previous ECHO and stress test noted. Mild AS, Ai and preserved LV function   Neuro/Psych    GI/Hepatic   Endo/Other    Renal/GU      Musculoskeletal  (+) Arthritis ,   Abdominal   Peds  Hematology  (+) anemia ,   Anesthesia Other Findings   Reproductive/Obstetrics                             Anesthesia Physical Anesthesia Plan  ASA: III  Anesthesia Plan: General   Post-op Pain Management:    Induction: Intravenous  Airway Management Planned: Oral ETT  Additional Equipment:   Intra-op Plan:   Post-operative Plan: Extubation in OR  Informed Consent: I have reviewed the patients History and Physical, chart, labs and discussed the procedure including the risks, benefits and alternatives for the proposed anesthesia with the patient or authorized representative who has indicated his/her understanding and acceptance.   Dental advisory given  Plan Discussed with: CRNA  Anesthesia Plan Comments:         Anesthesia Quick Evaluation

## 2015-11-30 NOTE — H&P (View-Only) (Signed)
Nicole GarnetLinda J Graves DOB: 1945/10/08 Divorced / Language: [place] / Race: White Female Date of Admission:  11/30/2015 CC:  Right Knee Pain History of Present Illness The patient is a 70 year old female who comes in for a preoperative History and Physical. The patient is scheduled for a right total knee arthroplasty to be performed by Dr. Gus RankinFrank V. Aluisio, [facility] on 11/30/2015. The patient is a 70 year old female who presented for follow up of their knee. The patient is being followed for their right (worse than left) knee pain and osteoarthritis. They are now months out from cortisone injections. Symptoms reported include: pain, swelling, grinding, giving way, instability and difficulty ambulating. The patient feels that they are doing poorly. Current treatment includes: home exercise program (She was previously evaluated at [facility], and was given some exercises to do at home. She states that she has not been consistent with her exercises). The following medication has been used for pain control: Tramadol. The patient has reported symptom improvement with: Cortisone injections (last injections were not as beneficial as previous injections) while they have not gotten any relief of their symptoms with: bracing or viscosupplementation. She had a recent series of visco at the [facility]. She was also given a custom brace, that she reports is more cumbersome to wear than it is beneficial. Unfortunately, her knees are getting progressively worse over time. Right knee is far more symptomatic than the left. She is at a stage now where she is ready to get it fixed. She has had severe pain for a long time and now, her function is getting worse. The injections at [facility] did not help her at all. Cortisone has helped some in the past. She is over for cortisone injection today just to help get her through the summer. She is ready to get her right knee replaced. They have been  treated conservatively in the past for the above stated problem and despite conservative measures, they continue to have progressive pain and severe functional limitations and dysfunction. They have failed non-operative management including home exercise, medications, and injections. It is felt that they would benefit from undergoing total joint replacement. Risks and benefits of the procedure have been discussed with the patient and they elect to proceed with surgery. There are no active contraindications to surgery such as ongoing infection or rapidly progressive neurological disease.  Problem List/Past Medical Chronic pain of right knee (M25.561)  Primary osteoarthritis of both knees (M17.0)  Knee injury, right, initial encounter (J47.82NF(S89.91XA)  Vertigo  Depression  Chronic Pain  Ulcer disease  Heart murmur  Cataract  Pneumonia  Arrhythmia  Intermittent Gastroesophageal Reflux Disease  Gastric Ulcer  Past History Hemorrhoids  Chronic fatigue syndrome  Menopause   Allergies  Sulfanilamide *CHEMICALS*  Nausea, Vomiting.  Family History  Heart disease in female family member before age 72  Hypertension  Mother. Osteoarthritis  Brother, Mother, Sister. Heart disease in female family member before age 72  Osteoporosis  Sister. First Degree Relatives  reported Cancer  Brother, Mother. Depression  Mother, Sister. Chronic Obstructive Lung Disease  Mother. Congestive Heart Failure  Mother. Diabetes Mellitus  Sister.  Social History Marital status  divorced Current work status  retired Tobacco use  Never smoker. 03/28/2013 Current drinker  03/28/2013: Currently drinks only occasionally per week Children  2 Living situation  live alone Number of flights of stairs before winded  1 No history of drug/alcohol rehab  Under pain contract  Exercise  Exercises never; does other Tobacco / smoke exposure  03/28/2013: no Post-Surgical Plans  Home with  Daughter Advance Directives  Living Will  Medication History  Voltaren (50MG  Tablet DR, Oral) Active. TraMADol HCl (50MG  Tablet, Oral) Active. Metoprolol Succinate ER (25MG  Tablet ER 24HR, Oral) Active. Melatonin (1MG  Tablet, Oral) Active. Cyclobenzaprine HCl (10MG  Tablet, Oral) Active. Atorvastatin Calcium (10MG  Tablet, Oral) Active. [name] (40MG  Tablet, Oral) Active. Cymbalta (30MG  Capsule DR Part, Oral) Active. BuPROPion HCl ER (XL) (300MG  Tablet ER 24HR, Oral) Active. Claritin (10MG  Tablet, Oral) Active. (generic) Vitamin D (2000UNIT Tablet, Oral) Active. Omeprazole (20MG  Capsule ER, Oral) Active. CeleBREX (200MG  Capsule, Oral) Active.  Past Surgical History Tubal Ligation  Gallbladder Surgery  Date: 1968. open Cataract Surgery  Date: 2012. bilateral Arthroscopy of Knee  right Colon Polyp Removal - Colonoscopy  Cesarean Delivery  2 times; 1967 and 1982  Review of Systems General Present- Fatigue. Not Present- Chills, Fever, Memory Loss, Night Sweats, Weight Gain and Weight Loss. Skin Not Present- Eczema, Hives, Itching, Lesions and Rash. HEENT Not Present- Dentures, Double Vision, Headache, Hearing Loss, Tinnitus and Visual Loss. Respiratory Present- Shortness of breath with exertion. Not Present- Allergies, Chronic Cough, Coughing up blood and Shortness of breath at rest. Cardiovascular Not Present- Chest Pain, Difficulty Breathing Lying Down, Murmur, Palpitations, Racing/skipping heartbeats and Swelling. Gastrointestinal Present- Constipation and Heartburn. Not Present- Abdominal Pain, Bloody Stool, Diarrhea, Difficulty Swallowing, Jaundice, Loss of appetitie, Nausea and Vomiting. Female Genitourinary Present- Urinating at Night. Not Present- Blood in Urine, Discharge, Flank Pain, Incontinence, Painful Urination, Urgency, Urinary frequency, Urinary Retention and Weak urinary stream. Musculoskeletal Present- Back Pain, Joint Pain, Joint Swelling, Morning  Stiffness, Muscle Pain and Muscle Weakness. Not Present- Spasms. Neurological Not Present- Blackout spells, Difficulty with balance, Dizziness, Paralysis, Tremor and Weakness. Psychiatric Not Present- Insomnia.  Vitals Weight: 198 lb Height: 64in Body Surface Area: 1.95 m Body Mass Index: 33.99 kg/m  Pulse: 76 (Regular)  BP: 132/74 (Sitting, Right Arm, Standard)  Physical Exam General Mental Status -Alert, cooperative and good historian. General Appearance-pleasant, Not in acute distress. Orientation-Oriented X3. Build & Nutrition-Well nourished and Well developed.  Head and Neck Head-normocephalic, atraumatic . Neck Global Assessment - supple, no bruit auscultated on the right, no bruit auscultated on the left.  Eye Pupil - Bilateral-Regular and Round. Motion - Bilateral-EOMI.  ENMT Note: upper and lower partial denture plates   Chest and Lung Exam Auscultation Breath sounds - clear at anterior chest wall and clear at posterior chest wall. Adventitious sounds - No Adventitious sounds.  Cardiovascular Auscultation Rhythm - Regular rate and rhythm. Heart Sounds - S1 WNL and S2 WNL. Murmurs & Other Heart Sounds - Auscultation of the heart reveals - No Murmurs.  Abdomen Palpation/Percussion Tenderness - Abdomen is non-tender to palpation. Rigidity (guarding) - Abdomen is soft. Auscultation Auscultation of the abdomen reveals - Bowel sounds normal.  Female Genitourinary Note: Not done, not pertinent to present illness   Musculoskeletal Note: On exam, she is in no distress. Her right knee shows trace effusion. I can flex her down about 50 and she has full extension. She is slightly tender laterally. There is no instability.   Assessment & Plan Primary osteoarthritis of right knee (M17.11) Primary osteoarthritis of left knee (M17.12)  Note:Surgical Plans: Right Total Knee Replacement  Disposition: Home with daughter  PCP: Dr.  Hyacinth Meeker - Patient has been seen preoperatively and felt to be stable for surgery. Preop Labs, CXR, and EKG performed in therir office on 11/03/2015 HGB 12.7  Cards: Dr. [name]  IV TXA  Anesthesia Issues: None  Signed electronically by Beckey Rutter, III PA-C

## 2015-11-30 NOTE — Op Note (Signed)
OPERATIVE REPORT-TOTAL KNEE ARTHROPLASTY   Pre-operative diagnosis- Osteoarthritis  Right knee(s)  Post-operative diagnosis- Osteoarthritis Right knee(s)  Procedure-  Right  Total Knee Arthroplasty  Surgeon- Gus Rankin. Sophiagrace Benbrook, MD  Assistant- Avel Peace, PA-C   Anesthesia-  Spinal  EBL-* No blood loss amount entered *   Drains Hemovac  Tourniquet time-  Total Tourniquet Time Documented: Thigh (Right) - 33 minutes Total: Thigh (Right) - 33 minutes     Complications- None  Condition-PACU - hemodynamically stable.   Brief Clinical Note  Nicole Graves is a 70 y.o. year old female with end stage OA of her right knee with progressively worsening pain and dysfunction. She has constant pain, with activity and at rest and significant functional deficits with difficulties even with ADLs. She has had extensive non-op management including analgesics, injections of cortisone and viscosupplements, and home exercise program, but remains in significant pain with significant dysfunction.Radiographs show bone on bone arthritis lateral and patellofemoral. She presents now for right Total Knee Arthroplasty.    Procedure in detail---   The patient is brought into the operating room and positioned supine on the operating table. After successful administration of  Spinal,   a tourniquet is placed high on the  Right thigh(s) and the lower extremity is prepped and draped in the usual sterile fashion. Time out is performed by the operating team and then the  Right lower extremity is wrapped in Esmarch, knee flexed and the tourniquet inflated to 300 mmHg.       A midline incision is made with a ten blade through the subcutaneous tissue to the level of the extensor mechanism. A fresh blade is used to make a medial parapatellar arthrotomy. Soft tissue over the proximal medial tibia is subperiosteally elevated to the joint line with a knife and into the semimembranosus bursa with a Cobb elevator. Soft tissue  over the proximal lateral tibia is elevated with attention being paid to avoiding the patellar tendon on the tibial tubercle. The patella is everted, knee flexed 90 degrees and the ACL and PCL are removed. Findings are bone on bone lateral and patellofemoral with massive global osteophytes.        The drill is used to create a starting hole in the distal femur and the canal is thoroughly irrigated with sterile saline to remove the fatty contents. The 5 degree Right  valgus alignment guide is placed into the femoral canal and the distal femoral cutting block is pinned to remove 10 mm off the distal femur. Resection is made with an oscillating saw.      The tibia is subluxed forward and the menisci are removed. The extramedullary alignment guide is placed referencing proximally at the medial aspect of the tibial tubercle and distally along the second metatarsal axis and tibial crest. The block is pinned to remove 2mm off the more deficient lateral  side. Resection is made with an oscillating saw. Size 3is the most appropriate size for the tibia and the proximal tibia is prepared with the modular drill and keel punch for that size.      The femoral sizing guide is placed and size 3 is most appropriate. Rotation is marked off the epicondylar axis and confirmed by creating a rectangular flexion gap at 90 degrees. The size 3 cutting block is pinned in this rotation and the anterior, posterior and chamfer cuts are made with the oscillating saw. The intercondylar block is then placed and that cut is made.  Trial size 3 tibial component, trial size 3 posterior stabilized femur and a 12.5  mm posterior stabilized rotating platform insert trial is placed. Full extension is achieved with excellent varus/valgus and anterior/posterior balance throughout full range of motion. The patella is everted and thickness measured to be 22  mm. Free hand resection is taken to 12 mm, a 38 template is placed, lug holes are drilled,  trial patella is placed, and it tracks normally. Osteophytes are removed off the posterior femur with the trial in place. All trials are removed and the cut bone surfaces prepared with pulsatile lavage. Cement is mixed and once ready for implantation, the size 3 tibial implant, size  3 posterior stabilized femoral component, and the size 38 patella are cemented in place and the patella is held with the clamp. The trial insert is placed and the knee held in full extension. The Exparel (20 ml mixed with 30 ml saline) and .25% Bupivicaine, are injected into the extensor mechanism, posterior capsule, medial and lateral gutters and subcutaneous tissues.  All extruded cement is removed and once the cement is hard the permanent 12.5 mm posterior stabilized rotating platform insert is placed into the tibial tray.      The wound is copiously irrigated with saline solution and the extensor mechanism closed over a hemovac drain with #1 V-loc suture. The tourniquet is released for a total tourniquet time of 33  minutes. Flexion against gravity is 140 degrees and the patella tracks normally. Subcutaneous tissue is closed with 2.0 vicryl and subcuticular with running 4.0 Monocryl. The incision is cleaned and dried and steri-strips and a bulky sterile dressing are applied. The limb is placed into a knee immobilizer and the patient is awakened and transported to recovery in stable condition.      Please note that a surgical assistant was a medical necessity for this procedure in order to perform it in a safe and expeditious manner. Surgical assistant was necessary to retract the ligaments and vital neurovascular structures to prevent injury to them and also necessary for proper positioning of the limb to allow for anatomic placement of the prosthesis.   Gus RankinFrank V. Taym Twist, MD    11/30/2015, 2:29 PM

## 2015-11-30 NOTE — Anesthesia Procedure Notes (Signed)
Procedure Name: Intubation Date/Time: 11/30/2015 1:26 PM Performed by: Doran ClayALDAY, Clydette Privitera R Pre-anesthesia Checklist: Patient identified, Emergency Drugs available, Suction available, Patient being monitored and Timeout performed Patient Re-evaluated:Patient Re-evaluated prior to inductionOxygen Delivery Method: Circle system utilized Preoxygenation: Pre-oxygenation with 100% oxygen Intubation Type: IV induction Laryngoscope Size: Mac and 3 Grade View: Grade II Tube type: Oral Tube size: 7.5 mm Number of attempts: 1 Airway Equipment and Method: Stylet Placement Confirmation: ETT inserted through vocal cords under direct vision,  positive ETCO2 and breath sounds checked- equal and bilateral Secured at: 22 cm Tube secured with: Tape Dental Injury: Teeth and Oropharynx as per pre-operative assessment

## 2015-12-01 LAB — BASIC METABOLIC PANEL
Anion gap: 5 (ref 5–15)
BUN: 11 mg/dL (ref 6–20)
CALCIUM: 9.4 mg/dL (ref 8.9–10.3)
CO2: 30 mmol/L (ref 22–32)
CREATININE: 0.76 mg/dL (ref 0.44–1.00)
Chloride: 103 mmol/L (ref 101–111)
Glucose, Bld: 134 mg/dL — ABNORMAL HIGH (ref 65–99)
Potassium: 4.9 mmol/L (ref 3.5–5.1)
SODIUM: 138 mmol/L (ref 135–145)

## 2015-12-01 LAB — CBC
HEMATOCRIT: 37.8 % (ref 36.0–46.0)
Hemoglobin: 12.3 g/dL (ref 12.0–15.0)
MCH: 29.4 pg (ref 26.0–34.0)
MCHC: 32.5 g/dL (ref 30.0–36.0)
MCV: 90.2 fL (ref 78.0–100.0)
PLATELETS: 299 10*3/uL (ref 150–400)
RBC: 4.19 MIL/uL (ref 3.87–5.11)
RDW: 13 % (ref 11.5–15.5)
WBC: 10.4 10*3/uL (ref 4.0–10.5)

## 2015-12-01 MED ORDER — SODIUM CHLORIDE 0.9 % IV BOLUS (SEPSIS)
250.0000 mL | Freq: Once | INTRAVENOUS | Status: AC
  Administered 2015-12-01: 250 mL via INTRAVENOUS

## 2015-12-01 MED ORDER — NON FORMULARY: 20.0000 mg | Freq: Every day | Status: DC

## 2015-12-01 MED ORDER — PREDNISONE 5 MG (21) PO TBPK
5.0000 mg | ORAL_TABLET | Freq: Four times a day (QID) | ORAL | Status: DC
  Administered 2015-12-03: 5 mg via ORAL

## 2015-12-01 MED ORDER — PREDNISONE 5 MG (21) PO TBPK
5.0000 mg | ORAL_TABLET | ORAL | Status: AC
  Administered 2015-12-01: 5 mg via ORAL

## 2015-12-01 MED ORDER — PREDNISONE 5 MG (21) PO TBPK
5.0000 mg | ORAL_TABLET | Freq: Three times a day (TID) | ORAL | Status: AC
  Administered 2015-12-02 (×3): 5 mg via ORAL

## 2015-12-01 MED ORDER — PREDNISONE 5 MG (21) PO TBPK
10.0000 mg | ORAL_TABLET | Freq: Every evening | ORAL | Status: AC
Start: 2015-12-01 — End: 2015-12-01
  Administered 2015-12-01: 10 mg via ORAL

## 2015-12-01 MED ORDER — PREDNISONE 5 MG (21) PO TBPK
10.0000 mg | ORAL_TABLET | Freq: Every morning | ORAL | Status: AC
  Administered 2015-12-01: 10 mg via ORAL
  Filled 2015-12-01: qty 21

## 2015-12-01 MED ORDER — OMEPRAZOLE 20 MG PO CPDR
20.0000 mg | DELAYED_RELEASE_CAPSULE | Freq: Every day | ORAL | Status: DC
  Administered 2015-12-02 – 2015-12-03 (×2): 20 mg via ORAL
  Filled 2015-12-01 (×2): qty 1

## 2015-12-01 MED ORDER — SODIUM CHLORIDE 0.9 % IV BOLUS (SEPSIS)
500.0000 mL | Freq: Once | INTRAVENOUS | Status: AC
  Administered 2015-12-01: 500 mL via INTRAVENOUS

## 2015-12-01 MED ORDER — PREDNISONE 5 MG (21) PO TBPK
10.0000 mg | ORAL_TABLET | Freq: Every evening | ORAL | Status: AC
  Administered 2015-12-02: 10 mg via ORAL

## 2015-12-01 NOTE — Anesthesia Postprocedure Evaluation (Signed)
Anesthesia Post Note  Patient: Nicole Graves  Procedure(s) Performed: Procedure(s) (LRB): RIGHT TOTAL KNEE ARTHROPLASTY (Right)  Patient location during evaluation: PACU Anesthesia Type: General Level of consciousness: awake and alert Pain management: pain level controlled Vital Signs Assessment: post-procedure vital signs reviewed and stable Respiratory status: spontaneous breathing, nonlabored ventilation, respiratory function stable and patient connected to nasal cannula oxygen Cardiovascular status: blood pressure returned to baseline and stable Postop Assessment: no signs of nausea or vomiting Anesthetic complications: no    Last Vitals:  Vitals:   12/01/15 0255 12/01/15 0614  BP: (!) 145/91 (!) 146/73  Pulse: (!) 102 96  Resp: 16 16  Temp: 37.1 C 36.8 C    Last Pain:  Vitals:   12/01/15 0641  TempSrc:   PainSc: 4                  Jolan Mealor,JAMES TERRILL

## 2015-12-01 NOTE — Progress Notes (Signed)
Occupational Therapy Evaluation Patient Details Name: Nicole GarnetLinda J Graves MRN: [id number] DOB: [birth year] Today's Date: 12/01/2015    History of Present Illness 70 y.o. female admitted for R TKA 11/30/15, PMH of vertigo, GERD, chronic fatigue   Clinical Impression   Patient presents to OT with decreased ADL independence s/p R TKA. Will benefit from skilled OT to maximize function and to facilitate a safe discharge. OT will follow.    Follow Up Recommendations  No OT follow up;Supervision/Assistance - 24 hour    Equipment Recommendations  3 in 1 bedside comode    Recommendations for Other Services       Precautions / Restrictions Precautions Precautions: Fall;Knee Precaution Comments: fell late AUg, R tibia fx (nonsurgical) Required Braces or Orthoses: Knee Immobilizer - Right Knee Immobilizer - Right: Discontinue once straight leg raise with < 10 degree lag Restrictions Weight Bearing Restrictions: No Other Position/Activity Restrictions: wbat      Mobility Bed Mobility            General bed mobility comments: NT -- in recliner  Transfers Overall transfer level: Needs assistance Equipment used: Rolling walker (2 wheeled) Transfers: Sit to/from Stand Sit to Stand: Min assist         General transfer comment: VCs hand placement, min A to rise/steady    Balance                                         ADL Overall ADL's : Needs assistance/impaired Eating/Feeding: Independent;Sitting   Grooming: Wash/dry hands;Min Production designer, theatre/television/filmguard;Standing                   Toilet Transfer: Minimal assistance;Ambulation;BSC;RW   Toileting- Clothing Manipulation and Hygiene: Minimal assistance;Sit to/from Nurse, children'sstand     Tub/Shower Transfer Details (indicate cue type and reason): educated pt verbally on safe technique; she reports this was how she performed after tibia fx Functional mobility during ADLs: Minimal assistance;Min guard;Rolling walker        Vision     Perception     Praxis      Pertinent Vitals/Pain Pain Assessment: 0-10 Pain Score: 3  Pain Location: R knee Pain Descriptors / Indicators: Aching;Sore Pain Intervention(s): Limited activity within patient's tolerance;Monitored during session;Repositioned;Ice applied     Hand Dominance     Extremity/Trunk Assessment Upper Extremity Assessment Upper Extremity Assessment: Overall WFL for tasks assessed   Lower Extremity Assessment Lower Extremity Assessment: Defer to PT evaluation RLE Deficits / Details: 0-30* AAROM, SLR +2/5, ankle WNL   Cervical / Trunk Assessment Cervical / Trunk Assessment: Normal   Communication Communication Communication: No difficulties   Cognition Arousal/Alertness: Awake/alert Behavior During Therapy: WFL for tasks assessed/performed Overall Cognitive Status: Within Functional Limits for tasks assessed                     General Comments       Exercises       Shoulder Instructions      Home Living Family/patient expects to be discharged to:: Private residence Living Arrangements: Children Available Help at Discharge: Family;Available 24 hours/day Type of Home: House Home Access: Stairs to enter Entergy CorporationEntrance Stairs-Number of Steps: 3 Entrance Stairs-Rails: Left Home Layout: Able to live on main level with bedroom/bathroom     Bathroom Shower/Tub: Chief Strategy OfficerTub/shower unit   Bathroom Toilet: Standard Bathroom Accessibility: Yes How Accessible: Accessible via walker Home Equipment: Dan HumphreysWalker - 2  wheels          Prior Functioning/Environment Level of Independence: Independent                 OT Problem List: Decreased strength;Decreased range of motion;Decreased knowledge of use of DME or AE;Pain   OT Treatment/Interventions: Self-care/ADL training;DME and/or AE instruction;Therapeutic activities;Patient/family education    OT Goals(Current goals can be found in the care plan section) Acute Rehab OT  Goals Patient Stated Goal: to be able to walk OT Goal Formulation: With patient Time For Goal Achievement: 12/15/15 Potential to Achieve Goals: Good ADL Goals Pt Will Perform Lower Body Bathing: with supervision;sit to/from stand Pt Will Perform Lower Body Dressing: with supervision;sit to/from stand Pt Will Transfer to Toilet: with supervision;ambulating;bedside commode Pt Will Perform Toileting - Clothing Manipulation and hygiene: with supervision;sit to/from stand Pt Will Perform Tub/Shower Transfer: Tub transfer;with min assist;ambulating;rolling walker  OT Frequency: Min 2X/week   Barriers to D/C:            Co-evaluation              End of Session Equipment Utilized During Treatment: Rolling walker;Right knee immobilizer Nurse Communication: Mobility status  Activity Tolerance: Patient tolerated treatment well Patient left: in chair;with call bell/phone within reach;with chair alarm set;with family/visitor present   Time: 4696-2952 OT Time Calculation (min): 17 min Charges:  OT General Charges $OT Visit: 1 Procedure OT Evaluation $OT Eval Low Complexity: 1 Procedure G-Codes:    Nicole Graves A December 22, 2015, 1:46 PM

## 2015-12-01 NOTE — Care Management Note (Signed)
Case Management Note  Patient Details  Name: Nicole Graves MRN: [id number] Date of Birth: 12-May-1945  Subjective/Objective:                  RIGHT TOTAL KNEE ARTHROPLASTY (Right) Action/Plan: Discharge planning Expected Discharge Date:                  Expected Discharge Plan:  [name]  In-House Referral:     Discharge planning Services  CM Consult  Post Acute Care Choice:  Home Health Choice offered to:  Patient  DME Arranged:  3-N-1 DME Agency:  [place]:  PT [place]:  [facility]  Status of Service:  Completed, signed off  If discussed at [employer] of Stay Meetings, dates discussed:    Additional Comments: CM met with pt in room to offer choice of home health agency.  Pt chooses [facility] to render HHPT.  Referral given to [facility] rep, Tim.  CM notified [place] DME rep, Jermaine to please deliver the 3n1 to room prior to discharge.  No other CM needs were communicated. Dellie Catholic, RN 12/01/2015, 11:35 AM

## 2015-12-01 NOTE — Evaluation (Signed)
Physical Therapy Evaluation Patient Details Name: Nicole Graves MRN: [id number] DOB: 06-27-1945 Today's Date: 12/01/2015   History of Present Illness  70 y.o. female admitted for R TKA 11/30/15, PMH of vertigo, GERD, chronic fatigue  Clinical Impression  Pt is s/p TKA resulting in the deficits listed below (see PT Problem List). Pt ambulated 8' with RW, distance limited by pain/nausea. Instructed pt/family in TKA exercises. Good progress expected.  Pt will benefit from skilled PT to increase their independence and safety with mobility to allow discharge to the venue listed below.      Follow Up Recommendations Home health PT    Equipment Recommendations  3in1 (PT)    Recommendations for Other Services       Precautions / Restrictions Precautions Precautions: Fall;Knee Precaution Comments: fell late AUg, R tibia fx (nonsurgical) Required Braces or Orthoses: Knee Immobilizer - Right Restrictions Weight Bearing Restrictions: No Other Position/Activity Restrictions: wbat      Mobility  Bed Mobility Overal bed mobility: Needs Assistance Bed Mobility: Supine to Sit     Supine to sit: Min guard     General bed mobility comments: instructed pt to self assist RLE with LLE; VCs technique  Transfers Overall transfer level: Needs assistance Equipment used: Rolling walker (2 wheeled) Transfers: Sit to/from Stand Sit to Stand: Min assist         General transfer comment: VCs hand placement, min A to rise/steady  Ambulation/Gait Ambulation/Gait assistance: Min guard Ambulation Distance (Feet): 8 Feet Assistive device: Rolling walker (2 wheeled) Gait Pattern/deviations: Step-to pattern;Decreased step length - right;Antalgic;Trunk flexed   Gait velocity interpretation: Below normal speed for age/gender General Gait Details: distance limited by pain/nausea, VCs for sequencing, posture, and positioning in RW; no LOB  Stairs            Wheelchair Mobility     Modified Rankin (Stroke Patients Only)       Balance Overall balance assessment: Needs assistance;History of Falls   Sitting balance-Leahy Scale: Good       Standing balance-Leahy Scale: Poor                               Pertinent Vitals/Pain Pain Assessment: 0-10 Pain Score: 5  Pain Location: R knee Pain Intervention(s): Premedicated before session;Ice applied;Limited activity within patient's tolerance;Monitored during session    Home Living Family/patient expects to be discharged to:: Private residence Living Arrangements: Children Available Help at Discharge: Family;Available 24 hours/day Type of Home: House Home Access: Stairs to enter Entrance Stairs-Rails: Left Entrance Stairs-Number of Steps: 3 Home Layout: Able to live on main level with bedroom/bathroom Home Equipment: Walker - 2 wheels      Prior Function Level of Independence: Independent               Hand Dominance        Extremity/Trunk Assessment   Upper Extremity Assessment: Overall WFL for tasks assessed           Lower Extremity Assessment: RLE deficits/detail RLE Deficits / Details: 0-30* AAROM, SLR +2/5, ankle WNL    Cervical / Trunk Assessment: Normal  Communication   Communication: No difficulties  Cognition Arousal/Alertness: Awake/alert Behavior During Therapy: WFL for tasks assessed/performed Overall Cognitive Status: Within Functional Limits for tasks assessed                      General Comments      Exercises Total Joint  Exercises Ankle Circles/Pumps: AROM;Both;10 reps;Supine Quad Sets: AROM;Both;5 reps;Supine Heel Slides: AAROM;Right;10 reps;Supine Straight Leg Raises: AAROM;Left;5 reps;Supine Long Arc Quad: AAROM;Right;5 reps;Seated Goniometric ROM: 0-30* AAROM R knee   Assessment/Plan    PT Assessment Patient needs continued PT services  PT Problem List Decreased strength;Decreased range of motion;Decreased activity  tolerance;Pain;Decreased balance;Decreased knowledge of use of DME;Decreased mobility          PT Treatment Interventions DME instruction;Gait training;Stair training;Functional mobility training;Therapeutic exercise;Therapeutic activities;Patient/family education    PT Goals (Current goals can be found in the Care Plan section)  Acute Rehab PT Goals Patient Stated Goal: to be able to walk PT Goal Formulation: With patient/family Time For Goal Achievement: 12/08/15 Potential to Achieve Goals: Good    Frequency 7X/week   Barriers to discharge        Co-evaluation               End of Session Equipment Utilized During Treatment: Gait belt Activity Tolerance: Patient limited by pain Patient left: in chair;with call bell/phone within reach;with family/visitor present Nurse Communication: Mobility status         Time: [id number] PT Time Calculation (min) (ACUTE ONLY): 37 min   Charges:   PT Evaluation $PT Eval Low Complexity: 1 Procedure PT Treatments $Gait Training: 8-22 mins   PT G Codes:        Tamala SerUhlenberg, Cyara Devoto Kistler 12/01/2015, 11:05 AM [phone]

## 2015-12-01 NOTE — Discharge Instructions (Addendum)
° °Dr. Frank Aluisio °Total Joint Specialist °[facility] °[address] °[place], [place] [address] °[phone] ° °TOTAL KNEE REPLACEMENT POSTOPERATIVE DIRECTIONS ° °Knee Rehabilitation, Guidelines Following Surgery  °Results after knee surgery are often greatly improved when you follow the exercise, range of motion and muscle strengthening exercises prescribed by your doctor. Safety measures are also important to protect the knee from further injury. Any time any of these exercises cause you to have increased pain or swelling in your knee joint, decrease the amount until you are comfortable again and slowly increase them. If you have problems or questions, call your caregiver or physical therapist for advice.  ° °HOME CARE INSTRUCTIONS  °Remove items at home which could result in a fall. This includes throw rugs or furniture in walking pathways.  °· ICE to the affected knee every three hours for 30 minutes at a time and then as needed for pain and swelling.  Continue to use ice on the knee for pain and swelling from surgery. You may notice swelling that will progress down to the foot and ankle.  This is normal after surgery.  Elevate the leg when you are not up walking on it.   °· Continue to use the breathing machine which will help keep your temperature down.  It is common for your temperature to cycle up and down following surgery, especially at night when you are not up moving around and exerting yourself.  The breathing machine keeps your lungs expanded and your temperature down. °· Do not place pillow under knee, focus on keeping the knee straight while resting ° °DIET °You may resume your previous home diet once your are discharged from the hospital. ° °DRESSING / WOUND CARE / SHOWERING °You may shower 3 days after surgery, but keep the wounds dry during showering.  You may use an occlusive plastic wrap (Press'n Seal for example), NO SOAKING/SUBMERGING IN THE BATHTUB.  If the  bandage gets wet, change with a clean dry gauze.  If the incision gets wet, pat the wound dry with a clean towel. °You may start showering once you are discharged home but do not submerge the incision under water. Just pat the incision dry and apply a dry gauze dressing on daily. °Change the surgical dressing daily and reapply a dry dressing each time. ° °ACTIVITY °Walk with your walker as instructed. °Use walker as long as suggested by your caregivers. °Avoid periods of inactivity such as sitting longer than an hour when not asleep. This helps prevent blood clots.  °You may resume a sexual relationship in one month or when given the OK by your doctor.  °You may return to work once you are cleared by your doctor.  °Do not drive a car for 6 weeks or until released by you surgeon.  °Do not drive while taking narcotics. ° °WEIGHT BEARING °Weight bearing as tolerated with assist device (walker, cane, etc) as directed, use it as long as suggested by your surgeon or therapist, typically at least 4-6 weeks. ° °POSTOPERATIVE CONSTIPATION PROTOCOL °Constipation - defined medically as fewer than three stools per week and severe constipation as less than one stool per week. ° °One of the most common issues patients have following surgery is constipation.  Even if you have a regular bowel pattern at home, your normal regimen is likely to be disrupted due to multiple reasons following surgery.  Combination of anesthesia, postoperative narcotics, change in appetite and fluid intake all can affect your bowels.    In order to avoid complications following surgery, here are some recommendations in order to help you during your recovery period. ° °Colace (docusate) - Pick up an over-the-counter form of Colace or another stool softener and take twice a day as long as you are requiring postoperative pain medications.  Take with a full glass of water daily.  If you experience loose stools or diarrhea, hold the colace until you stool forms  back up.  If your symptoms do not get better within 1 week or if they get worse, check with your doctor. ° °Dulcolax (bisacodyl) - Pick up over-the-counter and take as directed by the product packaging as needed to assist with the movement of your bowels.  Take with a full glass of water.  Use this product as needed if not relieved by Colace only.  ° °MiraLax (polyethylene glycol) - Pick up over-the-counter to have on hand.  MiraLax is a solution that will increase the amount of water in your bowels to assist with bowel movements.  Take as directed and can mix with a glass of water, juice, soda, coffee, or tea.  Take if you go more than two days without a movement. °Do not use MiraLax more than once per day. Call your doctor if you are still constipated or irregular after using this medication for 7 days in a row. ° °If you continue to have problems with postoperative constipation, please contact the office for further assistance and recommendations.  If you experience "the worst abdominal pain ever" or develop nausea or vomiting, please contact the office immediatly for further recommendations for treatment. ° °ITCHING ° If you experience itching with your medications, try taking only a single pain pill, or even half a pain pill at a time.  You can also use Benadryl over the counter for itching or also to help with sleep.  ° °TED HOSE STOCKINGS °Wear the elastic stockings on both legs for three weeks following surgery during the day but you may remove then at night for sleeping. ° °MEDICATIONS °See your medication summary on the “After Visit Summary” that the nursing staff will review with you prior to discharge.  You may have some home medications which will be placed on hold until you complete the course of blood thinner medication.  It is important for you to complete the blood thinner medication as prescribed by your surgeon.  Continue your approved medications as instructed at time of  discharge. ° °PRECAUTIONS °If you experience chest pain or shortness of breath - call 911 immediately for transfer to the hospital emergency department.  °If you develop a fever greater that 101 F, purulent drainage from wound, increased redness or drainage from wound, foul odor from the wound/dressing, or calf pain - CONTACT YOUR SURGEON.   °                                                °FOLLOW-UP APPOINTMENTS °Make sure you keep all of your appointments after your operation with your surgeon and caregivers. You should call the office at the above phone number and make an appointment for approximately two weeks after the date of your surgery or on the date instructed by your surgeon outlined in the "After Visit Summary". ° ° °RANGE OF MOTION AND STRENGTHENING EXERCISES  °Rehabilitation of the knee is important following a knee injury or   an operation. After just a few days of immobilization, the muscles of the thigh which control the knee become weakened and shrink (atrophy). Knee exercises are designed to build up the tone and strength of the thigh muscles and to improve knee motion. Often times heat used for twenty to thirty minutes before working out will loosen up your tissues and help with improving the range of motion but do not use heat for the first two weeks following surgery. These exercises can be done on a training (exercise) mat, on the floor, on a table or on a bed. Use what ever works the best and is most comfortable for you Knee exercises include:  °Leg Lifts - While your knee is still immobilized in a splint or cast, you can do straight leg raises. Lift the leg to 60 degrees, hold for 3 sec, and slowly lower the leg. Repeat 10-20 times 2-3 times daily. Perform this exercise against resistance later as your knee gets better.  °Quad and Hamstring Sets - Tighten up the muscle on the front of the thigh (Quad) and hold for 5-10 sec. Repeat this 10-20 times hourly. Hamstring sets are done by pushing the  foot backward against an object and holding for 5-10 sec. Repeat as with quad sets.  °· Leg Slides: Lying on your back, slowly slide your foot toward your buttocks, bending your knee up off the floor (only go as far as is comfortable). Then slowly slide your foot back down until your leg is flat on the floor again. °· Angel Wings: Lying on your back spread your legs to the side as far apart as you can without causing discomfort.  °A rehabilitation program following serious knee injuries can speed recovery and prevent re-injury in the future due to weakened muscles. Contact your doctor or a physical therapist for more information on knee rehabilitation.  ° °IF YOU ARE TRANSFERRED TO A SKILLED REHAB FACILITY °If the patient is transferred to a skilled rehab facility following release from the hospital, a list of the current medications will be sent to the facility for the patient to continue.  When discharged from the skilled rehab facility, please have the facility set up the patient's Home Health Physical Therapy prior to being released. Also, the skilled facility will be responsible for providing the patient with their medications at time of release from the facility to include their pain medication, the muscle relaxants, and their blood thinner medication. If the patient is still at the rehab facility at time of the two week follow up appointment, the skilled rehab facility will also need to assist the patient in arranging follow up appointment in our office and any transportation needs. ° °MAKE SURE YOU:  °Understand these instructions.  °Get help right away if you are not doing well or get worse.  ° ° °Pick up stool softner and laxative for home use following surgery while on pain medications. °Do not submerge incision under water. °Please use good hand washing techniques while changing dressing each day. °May shower starting three days after surgery. °Please use a clean towel to pat the incision dry following  showers. °Continue to use ice for pain and swelling after surgery. °Do not use any lotions or creams on the incision until instructed by your surgeon. ° °Take Xarelto for two and a half more weeks, then discontinue Xarelto. °Once the patient has completed the blood thinner regimen, then take a Baby 81 mg Aspirin daily for three more weeks. ° ° °Information   on my medicine - XARELTO® (Rivaroxaban) ° °This medication education was reviewed with me or my healthcare representative as part of my discharge preparation.  The pharmacist that spoke with me during my hospital stay was:  Nicole Dunn, Student-PharmD ° °Why was Xarelto® prescribed for you? °Xarelto® was prescribed for you to reduce the risk of blood clots forming after orthopedic surgery. The medical term for these abnormal blood clots is venous thromboembolism (VTE). ° °What do you need to know about xarelto® ? °Take your Xarelto® ONCE DAILY at the same time every day. °You may take it either with or without food. ° °If you have difficulty swallowing the tablet whole, you may crush it and mix in applesauce just prior to taking your dose. ° °Take Xarelto® exactly as prescribed by your doctor and DO NOT stop taking Xarelto® without talking to the doctor who prescribed the medication.  Stopping without other VTE prevention medication to take the place of Xarelto® may increase your risk of developing a clot. ° °After discharge, you should have regular check-up appointments with your healthcare provider that is prescribing your Xarelto®.   ° °What do you do if you miss a dose? °If you miss a dose, take it as soon as you remember on the same day then continue your regularly scheduled once daily regimen the next day. Do not take two doses of Xarelto® on the same day.  ° °Important Safety Information °A possible side effect of Xarelto® is bleeding. You should call your healthcare provider right away if you experience any of the following: °? Bleeding from an injury or  your nose that does not stop. °? Unusual colored urine (red or dark brown) or unusual colored stools (red or black). °? Unusual bruising for unknown reasons. °? A serious fall or if you hit your head (even if there is no bleeding). ° °Some medicines may interact with Xarelto® and might increase your risk of bleeding while on Xarelto®. To help avoid this, consult your healthcare provider or pharmacist prior to using any new prescription or non-prescription medications, including herbals, vitamins, non-steroidal anti-inflammatory drugs (NSAIDs) and supplements. ° °This website has more information on Xarelto®: www.xarelto.com. ° ° ° °

## 2015-12-01 NOTE — Progress Notes (Signed)
   Subjective: 1 Day Post-Op Procedure(s) (LRB): RIGHT TOTAL KNEE ARTHROPLASTY (Right) Patient reports pain as mild.   Patient seen in rounds for Dr. Lequita HaltAluisio. Slight increased HR.  Negative on fluids. Patient is well, but has had some minor complaints of pain in the knee, requiring pain medications We will start therapy today.  Plan is to go Home after hospital stay.  Objective: Vital signs in last 24 hours: Temp:  [97.5 F (36.4 C)-98.8 F (37.1 C)] 98.2 F (36.8 C) (10/17 0614) Pulse Rate:  [92-110] 108 (10/17 1045) Resp:  [13-19] 15 (10/17 1020) BP: (118-167)/(60-97) 118/77 (10/17 1045) SpO2:  [93 %-100 %] 98 % (10/17 1045)  Intake/Output from previous day:  Intake/Output Summary (Last 24 hours) at 12/01/15 1157 Last data filed at 12/01/15 0921  Gross per 24 hour  Intake             3295 ml  Output             3845 ml  Net             -550 ml    Intake/Output this shift: Total I/O In: 120 [P.O.:120] Out: 600 [Urine:600]  Labs:  Recent Labs  12/01/15 0426  HGB 12.3    Recent Labs  12/01/15 0426  WBC 10.4  RBC 4.19  HCT 37.8  PLT 299    Recent Labs  12/01/15 0426  NA 138  K 4.9  CL 103  CO2 30  BUN 11  CREATININE 0.76  GLUCOSE 134*  CALCIUM 9.4   No results for input(s): LABPT, INR in the last 72 hours.  EXAM General - Patient is Alert, Appropriate and Oriented Extremity - Neurovascular intact Sensation intact distally Dorsiflexion/Plantar flexion intact Dressing - dressing C/D/I Motor Function - intact, moving foot and toes well on exam.  Hemovac pulled without difficulty.  Past Medical History:  Diagnosis Date  . Anemia   . Anginal pain (HCC) 01/2015  . Aortic insufficiency    Echo 12/16: mild LVH, vigorous LVF, no RWMA, Gr 1 DD, mild AS (mean 15 mmHg), mod AI, mild LAE, mod TR, PASP 33 mmHg  . Arthritis    Knees, neck, back  . Chronic lower back pain    with siatica  . Colon polyp 1999  . Depression   . Dysrhythmia   . GERD  (gastroesophageal reflux disease)   . PONV (postoperative nausea and vomiting)   . SVT (supraventricular tachycardia) (HCC)     Assessment/Plan: 1 Day Post-Op Procedure(s) (LRB): RIGHT TOTAL KNEE ARTHROPLASTY (Right) Principal Problem:   OA (osteoarthritis) of knee  Estimated body mass index is 32.62 kg/m as calculated from the following:   Height as of this encounter: 5\' 5"  (1.651 m).   Weight as of this encounter: 88.9 kg (196 lb). Advance diet Up with therapy Plan for discharge tomorrow Discharge home with home health  DVT Prophylaxis - Xarelto Weight-Bearing as tolerated to right leg D/C O2 and Pulse OX and try on Room Air  Avel Peacerew Jazarah Capili, PA-C Orthopaedic Surgery 12/01/2015, 11:57 AM

## 2015-12-01 NOTE — Progress Notes (Signed)
Physical Therapy Treatment Patient Details Name: Nicole GarnetLinda J Graves MRN: [id number] DOB: 06-17-1945 Today's Date: 12/01/2015    History of Present Illness 70 y.o. female admitted for R TKA 11/30/15, PMH of vertigo, GERD, chronic fatigue    PT Comments    Pt progressing well with mobility, she ambulated 100' with RW and performed TKA exercises with min A. Noted pt felt "jittery and I'm talking a mile a minute", HR 130  90 sec after walking, 120 after 5 min rest. RN notified.   Follow Up Recommendations  Home health PT     Equipment Recommendations  3in1 (PT)    Recommendations for Other Services       Precautions / Restrictions Precautions Precautions: Fall;Knee Required Braces or Orthoses: Knee Immobilizer - Right Knee Immobilizer - Right: Discontinue once straight leg raise with < 10 degree lag Restrictions Weight Bearing Restrictions: No Other Position/Activity Restrictions: wbat    Mobility  Bed Mobility Overal bed mobility: Needs Assistance Bed Mobility: Sit to Supine       Sit to supine: Min assist   General bed mobility comments: min A for RLE into bed  Transfers Overall transfer level: Needs assistance Equipment used: Rolling walker (2 wheeled) Transfers: Sit to/from Stand Sit to Stand: Min assist         General transfer comment: VCs hand placement, min A to rise/steady  Ambulation/Gait Ambulation/Gait assistance: Min guard Ambulation Distance (Feet): 100 Feet Assistive device: Rolling walker (2 wheeled) Gait Pattern/deviations: Step-to pattern;Decreased step length - right;Antalgic Gait velocity: WFL   General Gait Details: steady with RW, no LOB, good sequencing, pt stated she feels "jittery and is talking a mile a minute", HR 130  1.5 min after ambulation, 120 after 5 min rest, RN notified   Stairs            Wheelchair Mobility    Modified Rankin (Stroke Patients Only)       Balance     Sitting balance-Leahy Scale: Good        Standing balance-Leahy Scale: Poor                      Cognition Arousal/Alertness: Awake/alert Behavior During Therapy: WFL for tasks assessed/performed Overall Cognitive Status: Within Functional Limits for tasks assessed                      Exercises Total Joint Exercises Ankle Circles/Pumps: AROM;Both;10 reps;Supine Quad Sets: AROM;Both;5 reps;Supine Short Arc Quad: AAROM;Right;10 reps Heel Slides: AAROM;Right;10 reps;Supine Straight Leg Raises: AAROM;Supine;Right;10 reps    General Comments        Pertinent Vitals/Pain Pain Assessment: 0-10 Pain Score: 3  Pain Location: R knee Pain Descriptors / Indicators: Sore Pain Intervention(s): Limited activity within patient's tolerance;Monitored during session;Premedicated before session;Ice applied    Home Living Family/patient expects to be discharged to:: Private residence Living Arrangements: Children Available Help at Discharge: Family;Available 24 hours/day Type of Home: House Home Access: Stairs to enter Entrance Stairs-Rails: Left Home Layout: Able to live on main level with bedroom/bathroom Home Equipment: Walker - 2 wheels      Prior Function Level of Independence: Independent          PT Goals (current goals can now be found in the care plan section) Acute Rehab PT Goals Patient Stated Goal: to be able to walk, go shopping PT Goal Formulation: With patient/family Time For Goal Achievement: 12/08/15 Potential to Achieve Goals: Good Progress towards PT goals: Progressing toward goals  Frequency    7X/week      PT Plan      Co-evaluation             End of Session Equipment Utilized During Treatment: Gait belt Activity Tolerance: Patient tolerated treatment well Patient left: with call bell/phone within reach;in bed     Time: 9604-5409 PT Time Calculation (min) (ACUTE ONLY): 20 min  Charges:  $Gait Training: 8-22 mins                    G Codes:       Ralene Bathe Kistler 12/01/2015, 3:24 PM [phone]

## 2015-12-02 DIAGNOSIS — I951 Orthostatic hypotension: Secondary | ICD-10-CM

## 2015-12-02 DIAGNOSIS — M1711 Unilateral primary osteoarthritis, right knee: Principal | ICD-10-CM

## 2015-12-02 DIAGNOSIS — R000 Tachycardia, unspecified: Secondary | ICD-10-CM

## 2015-12-02 LAB — CBC
HEMATOCRIT: 36 % (ref 36.0–46.0)
Hemoglobin: 12 g/dL (ref 12.0–15.0)
MCH: 29.2 pg (ref 26.0–34.0)
MCHC: 33.3 g/dL (ref 30.0–36.0)
MCV: 87.6 fL (ref 78.0–100.0)
Platelets: 356 10*3/uL (ref 150–400)
RBC: 4.11 MIL/uL (ref 3.87–5.11)
RDW: 13.1 % (ref 11.5–15.5)
WBC: 16.1 10*3/uL — AB (ref 4.0–10.5)

## 2015-12-02 LAB — BASIC METABOLIC PANEL
ANION GAP: 11 (ref 5–15)
BUN: 14 mg/dL (ref 6–20)
CALCIUM: 9.4 mg/dL (ref 8.9–10.3)
CHLORIDE: 102 mmol/L (ref 101–111)
CO2: 24 mmol/L (ref 22–32)
Creatinine, Ser: 0.87 mg/dL (ref 0.44–1.00)
GFR calc Af Amer: >60 mL/min (ref >60)
GFR calc non Af Amer: >60 mL/min (ref >60)
GLUCOSE: 130 mg/dL — AB (ref 65–99)
Potassium: 3.8 mmol/L (ref 3.5–5.1)
Sodium: 137 mmol/L (ref 135–145)

## 2015-12-02 LAB — TSH: TSH: 0.311 u[IU]/mL — ABNORMAL LOW (ref 0.350–4.500)

## 2015-12-02 MED ORDER — ALPRAZOLAM 0.5 MG PO TABS: 0.5000 mg | ORAL_TABLET | Freq: Three times a day (TID) | ORAL | Status: DC | PRN

## 2015-12-02 MED ORDER — SODIUM CHLORIDE 0.9 % IV BOLUS (SEPSIS)
500.0000 mL | Freq: Once | INTRAVENOUS | Status: AC
  Administered 2015-12-02: 500 mL via INTRAVENOUS

## 2015-12-02 NOTE — Progress Notes (Signed)
   Subjective: 2 Days Post-Op Procedure(s) (LRB): RIGHT TOTAL KNEE ARTHROPLASTY (Right) Patient reports pain as mild.   Patient seen in rounds with Dr. Lequita HaltAluisio.  HR fast yesterday, better this morning.  Will see how she does today. Patient is well, but has had some minor complaints of pain in the knee, requiring pain medications Possible home today if improves, otherwise keep and check with cards.  Objective: Vital signs in last 24 hours: Temp:  [97.8 F (36.6 C)-99.3 F (37.4 C)] 99.3 F (37.4 C) (10/18 0655) Pulse Rate:  [92-130] 103 (10/18 0655) Resp:  [15-16] 16 (10/18 0655) BP: (118-149)/(60-85) 129/73 (10/18 0655) SpO2:  [95 %-98 %] 98 % (10/18 0655)  Intake/Output from previous day:  Intake/Output Summary (Last 24 hours) at 12/02/15 0832 Last data filed at 12/02/15 0815  Gross per 24 hour  Intake          1526.08 ml  Output             2825 ml  Net         -1298.92 ml    Intake/Output this shift: Total I/O In: 240 [P.O.:240] Out: -   Labs:  Recent Labs  12/01/15 0426 12/02/15 0420  HGB 12.3 12.0    Recent Labs  12/01/15 0426 12/02/15 0420  WBC 10.4 16.1*  RBC 4.19 4.11  HCT 37.8 36.0  PLT 299 356    Recent Labs  12/01/15 0426 12/02/15 0420  NA 138 137  K 4.9 3.8  CL 103 102  CO2 30 24  BUN 11 14  CREATININE 0.76 0.87  GLUCOSE 134* 130*  CALCIUM 9.4 9.4   No results for input(s): LABPT, INR in the last 72 hours.  EXAM: General - Patient is Alert, Appropriate and Oriented Extremity - Neurovascular intact Sensation intact distally Dorsiflexion/Plantar flexion intact Incision - clean, dry, no drainage, healing Motor Function - intact, moving foot and toes well on exam.   Assessment/Plan: 2 Days Post-Op Procedure(s) (LRB): RIGHT TOTAL KNEE ARTHROPLASTY (Right) Procedure(s) (LRB): RIGHT TOTAL KNEE ARTHROPLASTY (Right) Past Medical History:  Diagnosis Date  . Anemia   . Anginal pain (HCC) 01/2015  . Aortic insufficiency    Echo  12/16: mild LVH, vigorous LVF, no RWMA, Gr 1 DD, mild AS (mean 15 mmHg), mod AI, mild LAE, mod TR, PASP 33 mmHg  . Arthritis    Knees, neck, back  . Chronic lower back pain    with siatica  . Colon polyp 1999  . Depression   . Dysrhythmia   . GERD (gastroesophageal reflux disease)   . PONV (postoperative nausea and vomiting)   . SVT (supraventricular tachycardia) (HCC)    Principal Problem:   OA (osteoarthritis) of knee  Estimated body mass index is 32.62 kg/m as calculated from the following:   Height as of this encounter: 5\' 5"  (1.651 m).   Weight as of this encounter: 88.9 kg (196 lb). Up with therapy Diet - Cardiac diet Follow up - in 2 weeks Activity - WBAT Disposition - Home Condition Upon Discharge - pending D/C Meds - See DC Summary DVT Prophylaxis - Xarelto  Avel Peacerew Osmani Kersten, PA-C Orthopaedic Surgery 12/02/2015, 8:32 AM

## 2015-12-02 NOTE — Progress Notes (Signed)
Occupational Therapy Treatment Patient Details Name: Nicole Graves MRN: [id number] DOB: 11-21-1945 Today's Date: 12/02/2015    History of present illness 70 y.o. female admitted for R TKA 11/30/15, PMH of vertigo, GERD, chronic fatigue   OT comments  All OT education completed and pt questions answered. No further OT needs at this time; will sign off.  Follow Up Recommendations  No OT follow up;Supervision/Assistance - 24 hour    Equipment Recommendations  3 in 1 bedside comode    Recommendations for Other Services      Precautions / Restrictions Precautions Precautions: Fall;Knee Precaution Comments: fell late AUg, R tibia fx (nonsurgical) Required Braces or Orthoses: Knee Immobilizer - Right Knee Immobilizer - Right: Discontinue once straight leg raise with < 10 degree lag Restrictions Weight Bearing Restrictions: No Other Position/Activity Restrictions: wbat       Mobility Bed Mobility                 Transfers                 Balance                                 ADL Overall ADL's : Needs assistance/impaired                                       General ADL Comments: Reviewed LB dressing techniques with patient and her daughter. They verbalized understanding but declined to practice at this time. Reviewed tub transfer technique with patient and daughter and they verbalized understanding.       Vision                     Perception     Praxis      Cognition   Behavior During Therapy: WFL for tasks assessed/performed Overall Cognitive Status: Within Functional Limits for tasks assessed                       Extremity/Trunk Assessment               Exercises    Shoulder Instructions       General Comments      Pertinent Vitals/ Pain       Pain Assessment: 0-10 Pain Score: 3  Pain Location: R knee Pain Descriptors / Indicators: Sore Pain Intervention(s): Monitored during  session;Limited activity within patient's tolerance  Home Living                                          Prior Functioning/Environment              Frequency           Progress Toward Goals  OT Goals(current goals can now be found in the care plan section)  Progress towards OT goals: Goals met/education completed, [name]  Acute Rehab OT Goals Patient Stated Goal: to be able to walk, go shopping  Plan All goals met and education completed, [name]    Co-evaluation                 End of Session     Activity Tolerance Patient tolerated  treatment well   Patient Left in chair;with call bell/phone within reach;with family/visitor present   Nurse Communication          Time: 4050-2035 OT Time Calculation (min): 8 min  Charges: OT General Charges $OT Visit: 1 Procedure OT Treatments $Self Care/Home Management : 8-22 mins  Nicole Graves A 12/02/2015, 12:27 PM

## 2015-12-02 NOTE — Progress Notes (Signed)
Physical Therapy Treatment Patient Details Name: Nicole GarnetLinda J Graves MRN: [id number] DOB: 1945-08-26 Today's Date: 12/02/2015    History of Present Illness 70 y.o. female admitted for R TKA 11/30/15, PMH of vertigo, GERD, chronic fatigue    PT Comments    POD # 2 pm session Assisted with stair training with daughter.  Advised to wear KI for increased support and advised to use one rail/one crutch.  Returned to room and assisted back to bed.   Follow Up Recommendations  Home health PT     Equipment Recommendations       Recommendations for Other Services       Precautions / Restrictions Precautions Precautions: Fall;Knee Precaution Comments: fell late AUg, R tibia fx (nonsurgical)  instructed to wear KI for stairs Required Braces or Orthoses: Knee Immobilizer - Right Knee Immobilizer - Right: Discontinue once straight leg raise with < 10 degree lag Restrictions Weight Bearing Restrictions: No Other Position/Activity Restrictions: wbat    Mobility  Bed Mobility Overal bed mobility: Needs Assistance         Sit to supine: Min assist   General bed mobility comments: assissted back to bed  Transfers Overall transfer level: Needs assistance Equipment used: Rolling walker (2 wheeled) Transfers: Sit to/from Stand Sit to Stand: Min guard;Min assist         General transfer comment: VCs hand placement, min guard for safety  Ambulation/Gait Ambulation/Gait assistance: Min guard Ambulation Distance (Feet): 55 Feet Assistive device: Rolling walker (2 wheeled) Gait Pattern/deviations: Step-to pattern;Decreased stance time - right     General Gait Details: decreased amb distance due to session focus on stairs.     Stairs Stairs: Yes Stairs assistance: Min assist Stair Management: One rail Left;Step to pattern;Forwards;With crutches Number of Stairs: 4 General stair comments: with daughter performed stair training.  Instructed to wear KI and use one crutch.  25% VC's  on proper sequencing and safety.    Wheelchair Mobility    Modified Rankin (Stroke Patients Only)       Balance                                    Cognition Arousal/Alertness: Awake/alert Behavior During Therapy: WFL for tasks assessed/performed Overall Cognitive Status: Within Functional Limits for tasks assessed                      Exercises      General Comments        Pertinent Vitals/Pain Pain Assessment: 0-10 Pain Location: R knee Pain Descriptors / Indicators: Discomfort;Grimacing;Sore Pain Intervention(s): Monitored during session;Repositioned;Ice applied    Home Living                      Prior Function            PT Goals (current goals can now be found in the care plan section) Progress towards PT goals: Progressing toward goals    Frequency    7X/week      PT Plan Current plan remains appropriate    Co-evaluation             End of Session Equipment Utilized During Treatment: Gait belt Activity Tolerance: Patient tolerated treatment well Patient left: in bed;with call bell/phone within reach;with family/visitor present     Time: [id number] PT Time Calculation (min) (ACUTE ONLY): 31 min  Charges:  $Gait Training: 8-22 mins $  Therapeutic Activity: 8-22 mins                    G Codes:      Rica Koyanagi  PTA [facility]  Acute  Rehab Pager      [phone]

## 2015-12-02 NOTE — Progress Notes (Signed)
Physical Therapy Treatment Patient Details Name: Nicole Graves MRN: [id number] DOB: February 20, 1945 Today's Date: 12/02/2015    History of Present Illness 70 y.o. female admitted for R TKA 11/30/15, PMH of vertigo, GERD, chronic fatigue    PT Comments    Pt progressing with mobility, she ambulated 120' with RW, max HR 130 with ambulation (RN and ortho PA aware of HR).   Follow Up Recommendations  Home health PT     Equipment Recommendations  3in1 (PT)    Recommendations for Other Services       Precautions / Restrictions Precautions Precautions: Fall;Knee Precaution Comments: fell late AUg, R tibia fx (nonsurgical) Required Braces or Orthoses: Knee Immobilizer - Right Knee Immobilizer - Right: Discontinue once straight leg raise with < 10 degree lag Restrictions Weight Bearing Restrictions: No Other Position/Activity Restrictions: wbat    Mobility  Bed Mobility               General bed mobility comments: up on EOB  Transfers Overall transfer level: Needs assistance Equipment used: Rolling walker (2 wheeled) Transfers: Sit to/from Stand Sit to Stand: Min guard         General transfer comment: VCs hand placement, min guard for safety  Ambulation/Gait Ambulation/Gait assistance: Min guard Ambulation Distance (Feet): 130 Feet Assistive device: Rolling walker (2 wheeled) Gait Pattern/deviations: Step-to pattern;Decreased step length - right Gait velocity: WFL   General Gait Details: steady with RW, no LOB, good sequencing, VCs to lift head, HR 130 max with walking, SaO2 98% on RA, HR 110 at rest, pt felt "jittery" prior to ambulating, this eased with slow deep breathing; verbally notified Avel Peace, PA of incr HR with ambulation   Stairs            Wheelchair Mobility    Modified Rankin (Stroke Patients Only)       Balance     Sitting balance-Leahy Scale: Good       Standing balance-Leahy Scale: Poor                       Cognition Arousal/Alertness: Awake/alert Behavior During Therapy: WFL for tasks assessed/performed Overall Cognitive Status: Within Functional Limits for tasks assessed                      Exercises Total Joint Exercises Long Arc Quad: AAROM;Right;10 reps;Seated Knee Flexion: AAROM;AROM;Right;10 reps;Seated Goniometric ROM: 5-65* AAROM R knee in sitting    General Comments        Pertinent Vitals/Pain Pain Score: 3  Pain Location: R knee Pain Descriptors / Indicators: Sore Pain Intervention(s): Limited activity within patient's tolerance;Monitored during session;Premedicated before session;Ice applied    Home Living                      Prior Function            PT Goals (current goals can now be found in the care plan section) Acute Rehab PT Goals Patient Stated Goal: to be able to walk, go shopping PT Goal Formulation: With patient/family Time For Goal Achievement: 12/08/15 Potential to Achieve Goals: Good Progress towards PT goals: Progressing toward goals    Frequency    7X/week      PT Plan Current plan remains appropriate    Co-evaluation             End of Session Equipment Utilized During Treatment: Gait belt Activity Tolerance: Patient tolerated treatment well Patient  left: with call bell/phone within reach;in chair;with nursing/sitter in room (NT in room to assess orthostatic vitals)     Time: [id number] PT Time Calculation (min) (ACUTE ONLY): 25 min  Charges:  $Gait Training: 8-22 mins $Therapeutic Exercise: 8-22 mins                    G Codes:      Tamala SerUhlenberg, Cabela Pacifico Kistler 12/02/2015, 9:58 AM [phone]

## 2015-12-02 NOTE — Consult Note (Signed)
Cardiology Consult    Patient ID: Nicole Graves MRN: [id number], DOB/AGE: 1945/08/22   Admit date: 11/30/2015 Date of Consult: 12/02/2015  Primary Physician: Neldon Labella, MD Reason for Consult: Tachycardia Primary Cardiologist: Dr. [name] Requesting Provider: Dr. Lequita Halt   History of Present Illness    Nicole Graves is a 70 y.o. female with past medical history of pSVT, mild AS, and moderate AI who presented to [facility] on 11/30/2015 for right total knee replacement.   She tolerated the procedure well with no immediate complications noted.   On post-op day 1, she reported having mild pain along her right knee. She ambulated with PT and Home Health PT was recommended. She was noted to be mildly tachycardiac with HR's varying from 90 - 110. An EKG was obtained which showed sinus tachycardia, HR 115, with no acute ST or T-wave changes.  She has continued to be tachycardiac today, with HR's varying from 90's - 130's. Labs show a WBC of 16.1, Hgb 12.0, and platelets 356. K+ 3.8. Creatinine 0.87. TSH 0.311. Orthostatic Vitals checked today with:   Lying:  BP: 125/74  P: 104 Sitting:  BP: 102/71  P: 108 Standing:  BP: 108/71  P: 123  In talking with the patient, she does note occasional palpitations. No associated chest discomfort, dyspnea, dizziness, or pre-syncope. Feels the palpitations more frequently when her pain is exacerbated. HR of 130 was obtained with ambulation. Says these palpitations do not feel as intense as previous episodes of SVT.   Past Medical History   Past Medical History:  Diagnosis Date  . Anemia   . Anginal pain (HCC) 01/2015  . Aortic insufficiency    Echo 12/16: mild LVH, vigorous LVF, no RWMA, Gr 1 DD, mild AS (mean 15 mmHg), mod AI, mild LAE, mod TR, PASP 33 mmHg  . Arthritis    Knees, neck, back  . Chronic lower back pain    with siatica  . Colon polyp 1999  . Depression   . Dysrhythmia   . GERD (gastroesophageal reflux disease)     . PONV (postoperative nausea and vomiting)   . SVT (supraventricular tachycardia) (HCC)     Past Surgical History:  Procedure Laterality Date  . CESAREAN SECTION  1964   X2  . CHOLECYSTECTOMY    . COSMETIC SURGERY  1980   removal extra fat inner thighs  . EYE SURGERY Bilateral     Cataract extraction with IOL  . TOTAL KNEE ARTHROPLASTY Right 11/30/2015   Procedure: RIGHT TOTAL KNEE ARTHROPLASTY;  Surgeon: Ollen Gross, MD;  Location: [facility];  Service: Orthopedics;  Laterality: Right;     Allergies  Allergies  Allergen Reactions  . Sulfa Antibiotics Nausea And Vomiting    Inpatient Medications    . atorvastatin  10 mg Oral QHS  . buPROPion  300 mg Oral QPC breakfast  . cyclobenzaprine  10 mg Oral QHS  . docusate sodium  100 mg Oral BID  . DULoxetine  90 mg Oral QPC breakfast  . loratadine  10 mg Oral Q0600  . lurasidone  40 mg Oral q1800  . metoprolol succinate  25 mg Oral QHS  . omeprazole  20 mg Oral Daily  . predniSONE  10 mg Oral Nightly  . predniSONE  5 mg Oral 3 x daily with food  . [START ON 12/03/2015] predniSONE  5 mg Oral 4X daily taper  . rivaroxaban  10 mg Oral Q breakfast    Family History  Family History  Problem Relation Age of Onset  . Colon cancer Mother   . Heart attack Brother   . Hypertension Brother   . Stroke Neg Hx     Social History    Social History   Social History  . Marital status: Divorced    Spouse name: N/A  . Number of children: N/A  . Years of education: N/A   Occupational History  . Not on file.   Social History Main Topics  . Smoking status: Never Smoker  . Smokeless tobacco: Never Used  . Alcohol use No  . Drug use: No  . Sexual activity: Not on file   Other Topics Concern  . Not on file   Social History Narrative  . No narrative on file     Review of Systems    General:  No chills, fever, night sweats or weight changes.  Cardiovascular:  No chest pain, dyspnea on exertion, edema, orthopnea,  paroxysmal nocturnal dyspnea. Positive for palpitations.  Dermatological: No rash, lesions/masses Respiratory: No cough, dyspnea Urologic: No hematuria, dysuria Abdominal:   No nausea, vomiting, diarrhea, bright red blood per rectum, melena, or hematemesis Neurologic:  No visual changes, wkns, changes in mental status. MSK: Positive for right knee pain.  All other systems reviewed and are otherwise negative except as noted above.  Physical Exam    Blood pressure 129/73, pulse 98, temperature 99.3 F (37.4 C), temperature source Oral, resp. rate 16, height 5\' 5"  (1.651 m), weight 196 lb (88.9 kg), SpO2 98 %.  General: Pleasant, Caucasian female appearing in NAD Psych: Normal affect. Neuro: Alert and oriented X 3. Moves all extremities spontaneously. HEENT: Normal  Neck: Supple without bruits or JVD. Lungs:  Resp regular and unlabored, CTA without wheezing or rales. Heart: RRR no s3, s4, or murmurs. Abdomen: Soft, non-tender, non-distended, BS + x 4.  Extremities: No clubbing, cyanosis or edema. DP/PT/Radials 2+ and equal bilaterally. Right knee with dressing in place.   Labs    Troponin (Point of Care Test) No results for input(s): TROPIPOC in the last 72 hours. No results for input(s): CKTOTAL, CKMB, TROPONINI in the last 72 hours. Lab Results  Component Value Date   WBC 16.1 (H) 12/02/2015   HGB 12.0 12/02/2015   HCT 36.0 12/02/2015   MCV 87.6 12/02/2015   PLT 356 12/02/2015    Recent Labs Lab 12/02/15 0420  NA 137  K 3.8  CL 102  CO2 24  BUN 14  CREATININE 0.87  CALCIUM 9.4  GLUCOSE 130*   No results found for: CHOL, HDL, LDLCALC, TRIG No results found for: [facility]   Radiology Studies    No results found.  EKG & Cardiac Imaging    EKG: Sinus tachycardia, HR 115, with no acute ST or T-wave changes  Echocardiogram: 01/2015 Study Conclusions  - Left ventricle: The cavity size was normal. There was mild   concentric hypertrophy. Systolic function was  vigorous. The   estimated ejection fraction was in the range of 65% to 70%. Wall   motion was normal; there were no regional wall motion   abnormalities. Doppler parameters are consistent with abnormal   left ventricular relaxation (grade 1 diastolic dysfunction). - Aortic valve: Valve mobility was mildly restricted. There was   mild stenosis. There was moderate regurgitation. Regurgitation   pressure half-time: 369 ms. - Left atrium: The atrium was mildly dilated. - Right ventricle: The cavity size was normal. Wall thickness was   normal. Systolic function was normal. -  Tricuspid valve: There was moderate regurgitation. - Pulmonary arteries: Systolic pressure was mildly increased. PA   peak pressure: 33 mm Hg (S). - Inferior vena cava: The vessel was normal in size. The   respirophasic diameter changes were in the normal range (>= 50%),   consistent with normal central venous pressure.  Assessment & Plan    1. Sinus Tachycardia - underwent right total knee replacement on 11/30/2015 with no immediate complications noted.  - HR has been variable between 90 - 130's since, with increased values occurring with pain/ambulation.  - EKG shows sinus tachycardia, HR 115, with no acute ST or T-wave changes. Labs with WBC of 16.1, Hgb 12.0, and platelets 356. K+ 3.8. TSH 0.311 (T4 pending - if not checked prior to discharge, can be followed by PCP). - HR 98 and with a regular rhythm on my exam. Will recheck EKG. - continue Toprol-XL 25mg  daily. Can take additional tablet once daily as needed for palpitations. Reviewed this with the patient. - stable for discharge today from a cardiac perspective as long as repeat EKG does not show any pathologic arrhythmias. Can follow-up with Dr. Mayford Knifeurner as scheduled.   2. Orthostatic Hypotension Readings today as below:  Lying:  BP: 125/74  P: 104 Sitting:  BP: 102/71  P: 108 Standing:  BP: 108/71  P: 123 - patient is possible discharge today. Encouraged  increased PO intake as she is net -1.1L this admission.   Signed, Ellsworth LennoxBrittany M Strader, PA-C 12/02/2015, 12:28 PM Pager: [phone]  Personally seen and examined. Agree with above.  Encouraged increased PO intake, salt to help with orthostatic hypotension. Support hose.  Sinus tachy on ECG. No adverse rhythms.  Prednisone, pain may be contributing to tachycardia as well.  On exam, Tachy RR, 2/6 systolic murmur RUSB,  lungs CTAB. AAO-x 3. No hypoxia. No CP. On Xarelto. Low clinical suspicion for PE.  Should be ok for DC later today.   Donato SchultzMark Siren Porrata, MD

## 2015-12-03 LAB — BASIC METABOLIC PANEL
Anion gap: 8 (ref 5–15)
BUN: 17 mg/dL (ref 6–20)
CALCIUM: 9.1 mg/dL (ref 8.9–10.3)
CO2: 24 mmol/L (ref 22–32)
Chloride: 104 mmol/L (ref 101–111)
Creatinine, Ser: 0.62 mg/dL (ref 0.44–1.00)
GFR calc Af Amer: >60 mL/min (ref >60)
GFR calc non Af Amer: >60 mL/min (ref >60)
GLUCOSE: 99 mg/dL (ref 65–99)
Potassium: 4 mmol/L (ref 3.5–5.1)
SODIUM: 136 mmol/L (ref 135–145)

## 2015-12-03 LAB — CBC
HCT: 31.4 % — ABNORMAL LOW (ref 36.0–46.0)
Hemoglobin: 10.4 g/dL — ABNORMAL LOW (ref 12.0–15.0)
MCH: 30.2 pg (ref 26.0–34.0)
MCHC: 33.1 g/dL (ref 30.0–36.0)
MCV: 91.3 fL (ref 78.0–100.0)
Platelets: 277 10*3/uL (ref 150–400)
RBC: 3.44 MIL/uL — ABNORMAL LOW (ref 3.87–5.11)
RDW: 13.6 % (ref 11.5–15.5)
WBC: 9.7 10*3/uL (ref 4.0–10.5)

## 2015-12-03 LAB — T4, FREE: FREE T4: 1.58 ng/dL — AB (ref 0.61–1.12)

## 2015-12-03 MED ORDER — METHOCARBAMOL 500 MG PO TABS: 500.0000 mg | ORAL_TABLET | Freq: Four times a day (QID) | ORAL | 0 refills | Status: DC | PRN

## 2015-12-03 MED ORDER — OXYCODONE HCL 5 MG PO TABS: 5.0000 mg | ORAL_TABLET | ORAL | 0 refills | Status: DC | PRN

## 2015-12-03 MED ORDER — RIVAROXABAN 10 MG PO TABS: 10.0000 mg | ORAL_TABLET | Freq: Every day | ORAL | 0 refills | Status: DC

## 2015-12-03 MED ORDER — ONDANSETRON HCL 4 MG PO TABS: 4.0000 mg | ORAL_TABLET | Freq: Four times a day (QID) | ORAL | 0 refills | Status: DC | PRN

## 2015-12-03 NOTE — Progress Notes (Signed)
   Subjective: 3 Days Post-Op Procedure(s) (LRB): RIGHT TOTAL KNEE ARTHROPLASTY (Right) Patient reports pain as mild.   Patient seen in rounds by Dr. Lequita HaltAluisio. Patient is well, but has had some minor complaints of pain in the knee, requiring pain medications Patient is ready to go to home following therapy.  Objective: Vital signs in last 24 hours: Temp:  [98.4 F (36.9 C)-98.6 F (37 C)] 98.5 F (36.9 C) (10/19 0622) Pulse Rate:  [100-130] 100 (10/19 0622) Resp:  [16] 16 (10/19 0622) BP: (96-117)/(55-70) 117/70 (10/19 0622) SpO2:  [92 %-99 %] 92 % (10/19 0622)  Intake/Output from previous day:  Intake/Output Summary (Last 24 hours) at 12/03/15 0747 Last data filed at 12/03/15 0600  Gross per 24 hour  Intake             1210 ml  Output              750 ml  Net              460 ml    Intake/Output this shift: No intake/output data recorded.  Labs:  Recent Labs  12/01/15 0426 12/02/15 0420 12/03/15 0417  HGB 12.3 12.0 10.4*    Recent Labs  12/02/15 0420 12/03/15 0417  WBC 16.1* 9.7  RBC 4.11 3.44*  HCT 36.0 31.4*  PLT 356 277    Recent Labs  12/02/15 0420 12/03/15 0417  NA 137 136  K 3.8 4.0  CL 102 104  CO2 24 24  BUN 14 17  CREATININE 0.87 0.62  GLUCOSE 130* 99  CALCIUM 9.4 9.1   No results for input(s): LABPT, INR in the last 72 hours.  EXAM: General - Patient is pain in the knee, requiring pain medications Extremity - Neurovascular intact Sensation intact distally Dorsiflexion/Plantar flexion intact Incision - clean, dry, no drainage Motor Function - intact, moving foot and toes well on exam.   Assessment/Plan: 3 Days Post-Op Procedure(s) (LRB): RIGHT TOTAL KNEE ARTHROPLASTY (Right) Procedure(s) (LRB): RIGHT TOTAL KNEE ARTHROPLASTY (Right) Past Medical History:  Diagnosis Date  . Anemia   . Anginal pain (HCC) 01/2015  . Aortic insufficiency    Echo 12/16: mild LVH, vigorous LVF, no RWMA, Gr 1 DD, mild AS (mean 15 mmHg), mod AI,  mild LAE, mod TR, PASP 33 mmHg  . Arthritis    Knees, neck, back  . Chronic lower back pain    with siatica  . Colon polyp 1999  . Depression   . Dysrhythmia   . GERD (gastroesophageal reflux disease)   . PONV (postoperative nausea and vomiting)   . SVT (supraventricular tachycardia) (HCC)    Principal Problem:   OA (osteoarthritis) of knee Active Problems:   Sinus tachycardia   Orthostatic hypotension  Estimated body mass index is 32.62 kg/m as calculated from the following:   Height as of this encounter: 5\' 5"  (1.651 m).   Weight as of this encounter: 88.9 kg (196 lb). Up with therapy Discharge home with home health Diet - Cardiac diet Follow up - in 2 weeks on Thursday 12/17/2015 with Dr. Lequita HaltAluisio. Activity - WBAT Disposition - Home Condition Upon Discharge - Stable D/C Meds - See DC Summary DVT Prophylaxis - Xarelto  Avel Peacerew Terrall Bley, PA-C Orthopaedic Surgery 12/03/2015, 7:47 AM

## 2015-12-03 NOTE — Progress Notes (Signed)
Physical Therapy Treatment Patient Details Name: Nicole GarnetLinda J Graves MRN: [id number] DOB: [birth year] Today's Date: 12/03/2015    History of Present Illness 70 y.o. female admitted for R TKA 11/30/15, PMH of vertigo, GERD, chronic fatigue    PT Comments    Assisted with amb, stairs and HEP. Pt ready for D/C to home.    Follow Up Recommendations  Home health PT     Equipment Recommendations       Recommendations for Other Services       Precautions / Restrictions Precautions Precautions: Fall;Knee Precaution Comments: instructed to wear KI for stairs Required Braces or Orthoses: Knee Immobilizer - Right Restrictions Weight Bearing Restrictions: No    Mobility  Bed Mobility               General bed mobility comments: Pt OOB in recliner  Transfers Overall transfer level: Needs assistance Equipment used: Rolling walker (2 wheeled) Transfers: Sit to/from Stand Sit to Stand: Supervision;Min guard         General transfer comment: VCs hand placement, min guard for safety  Ambulation/Gait Ambulation/Gait assistance: Supervision;Min guard Ambulation Distance (Feet): 55 Feet Assistive device: Rolling walker (2 wheeled) Gait Pattern/deviations: Step-to pattern     General Gait Details: decreased amb distance due to session focus on stairs.     Stairs Stairs: Yes Stairs assistance: Min assist Stair Management: One rail Left;Forwards;With crutches Number of Stairs: 4 General stair comments: with daughter performed stair training.  Instructed to wear KI and use one crutch.  25% VC's on proper sequencing and safety.    Wheelchair Mobility    Modified Rankin (Stroke Patients Only)       Balance                                    Cognition                            Exercises      General Comments        Pertinent Vitals/Pain      Home Living                      Prior Function            PT Goals (current  goals can now be found in the care plan section) Progress towards PT goals: Progressing toward goals    Frequency    7X/week      PT Plan Current plan remains appropriate    Co-evaluation             End of Session Equipment Utilized During Treatment: Gait belt Activity Tolerance: Patient tolerated treatment well Patient left: in chair;with call bell/phone within reach     Time: 1000-1040 PT Time Calculation (min) (ACUTE ONLY): 40 min  Charges:  $Gait Training: 8-22 mins $Therapeutic Exercise: 8-22 mins $Therapeutic Activity: 8-22 mins                    G Codes:      Felecia ShellingLori Dominico Rod  PTA [facility]  Acute  Rehab Pager      [phone]

## 2015-12-03 NOTE — Discharge Summary (Signed)
Physician Discharge Summary   Patient ID: Nicole Graves MRN: [id number] DOB/AGE: Feb 10, 1946 70 y.o.  Admit date: 11/30/2015 Discharge date: 12-03-2015  Primary Diagnosis:  Osteoarthritis  Right knee(s)  Admission Diagnoses:  Past Medical History:  Diagnosis Date  . Anemia   . Anginal pain ([place]) 01/2015  . Aortic insufficiency    Echo 12/16: mild LVH, vigorous LVF, no RWMA, Gr 1 DD, mild AS (mean 15 mmHg), mod AI, mild LAE, mod TR, PASP 33 mmHg  . Arthritis    Knees, neck, back  . Chronic lower back pain    with siatica  . Colon polyp 1999  . Depression   . Dysrhythmia   . GERD (gastroesophageal reflux disease)   . PONV (postoperative nausea and vomiting)   . SVT (supraventricular tachycardia) ([place])    Discharge Diagnoses:   Principal Problem:   OA (osteoarthritis) of knee Active Problems:   Sinus tachycardia   Orthostatic hypotension  Estimated body mass index is 32.62 kg/m as calculated from the following:   Height as of this encounter: _0  (1.651 m).   Weight as of this encounter: 88.9 kg (196 lb).  Procedure:  Procedure(s) (LRB): RIGHT TOTAL KNEE ARTHROPLASTY (Right)   Consults: cardiology, Dr. Candee Furbish  HPI: Nicole Graves is a 70 y.o. year old female with end stage OA of her right knee with progressively worsening pain and dysfunction. She has constant pain, with activity and at rest and significant functional deficits with difficulties even with ADLs. She has had extensive non-op management including analgesics, injections of cortisone and viscosupplements, and home exercise program, but remains in significant pain with significant dysfunction.Radiographs show bone on bone arthritis lateral and patellofemoral. She presents now for right Total Knee Arthroplasty.  Laboratory Data: Admission on 11/30/2015  Component Date Value Ref Range Status  . WBC 12/01/2015 10.4  4.0 - 10.5 K/uL Final  . RBC 12/01/2015 4.19  3.87 - 5.11 MIL/uL Final  . Hemoglobin  12/01/2015 12.3  12.0 - 15.0 g/dL Final  . HCT 12/01/2015 37.8  36.0 - 46.0 % Final  . MCV 12/01/2015 90.2  78.0 - 100.0 fL Final  . MCH 12/01/2015 29.4  26.0 - 34.0 pg Final  . MCHC 12/01/2015 32.5  30.0 - 36.0 g/dL Final  . RDW 12/01/2015 13.0  11.5 - 15.5 % Final  . Platelets 12/01/2015 299  150 - 400 K/uL Final  . Sodium 12/01/2015 138  135 - 145 mmol/L Final  . Potassium 12/01/2015 4.9  3.5 - 5.1 mmol/L Final  . Chloride 12/01/2015 103  101 - 111 mmol/L Final  . CO2 12/01/2015 30  22 - 32 mmol/L Final  . Glucose, Bld 12/01/2015 134* 65 - 99 mg/dL Final  . BUN 12/01/2015 11  6 - 20 mg/dL Final  . Creatinine, Ser 12/01/2015 0.76  0.44 - 1.00 mg/dL Final  . Calcium 12/01/2015 9.4  8.9 - 10.3 mg/dL Final  . GFR calc non Af Amer 12/01/2015 >60  >60 mL/min Final  . GFR calc Af Amer 12/01/2015 >60  >60 mL/min Final   Comment: (NOTE) The eGFR has been calculated using the CKD EPI equation. This calculation has not been validated in all clinical situations. eGFR's persistently <60 mL/min signify possible Chronic Kidney Disease.   . Anion gap 12/01/2015 5  5 - 15 Final  . WBC 12/02/2015 16.1* 4.0 - 10.5 K/uL Final  . RBC 12/02/2015 4.11  3.87 - 5.11 MIL/uL Final  . Hemoglobin 12/02/2015 12.0  12.0 -  15.0 g/dL Final  . HCT 12/02/2015 36.0  36.0 - 46.0 % Final  . MCV 12/02/2015 87.6  78.0 - 100.0 fL Final  . MCH 12/02/2015 29.2  26.0 - 34.0 pg Final  . MCHC 12/02/2015 33.3  30.0 - 36.0 g/dL Final  . RDW 12/02/2015 13.1  11.5 - 15.5 % Final  . Platelets 12/02/2015 356  150 - 400 K/uL Final  . Sodium 12/02/2015 137  135 - 145 mmol/L Final  . Potassium 12/02/2015 3.8  3.5 - 5.1 mmol/L Final  . Chloride 12/02/2015 102  101 - 111 mmol/L Final  . CO2 12/02/2015 24  22 - 32 mmol/L Final  . Glucose, Bld 12/02/2015 130* 65 - 99 mg/dL Final  . BUN 12/02/2015 14  6 - 20 mg/dL Final  . Creatinine, Ser 12/02/2015 0.87  0.44 - 1.00 mg/dL Final  . Calcium 12/02/2015 9.4  8.9 - 10.3 mg/dL Final  .  GFR calc non Af Amer 12/02/2015 >60  >60 mL/min Final  . GFR calc Af Amer 12/02/2015 >60  >60 mL/min Final   Comment: (NOTE) The eGFR has been calculated using the CKD EPI equation. This calculation has not been validated in all clinical situations. eGFR's persistently <60 mL/min signify possible Chronic Kidney Disease.   . Anion gap 12/02/2015 11  5 - 15 Final  . TSH 12/02/2015 0.311* 0.350 - 4.500 uIU/mL Final  . WBC 12/03/2015 9.7  4.0 - 10.5 K/uL Final  . RBC 12/03/2015 3.44* 3.87 - 5.11 MIL/uL Final  . Hemoglobin 12/03/2015 10.4* 12.0 - 15.0 g/dL Final  . HCT 12/03/2015 31.4* 36.0 - 46.0 % Final  . MCV 12/03/2015 91.3  78.0 - 100.0 fL Final  . MCH 12/03/2015 30.2  26.0 - 34.0 pg Final  . MCHC 12/03/2015 33.1  30.0 - 36.0 g/dL Final  . RDW 12/03/2015 13.6  11.5 - 15.5 % Final  . Platelets 12/03/2015 277  150 - 400 K/uL Final  . Sodium 12/03/2015 136  135 - 145 mmol/L Final  . Potassium 12/03/2015 4.0  3.5 - 5.1 mmol/L Final  . Chloride 12/03/2015 104  101 - 111 mmol/L Final  . CO2 12/03/2015 24  22 - 32 mmol/L Final  . Glucose, Bld 12/03/2015 99  65 - 99 mg/dL Final  . BUN 12/03/2015 17  6 - 20 mg/dL Final  . Creatinine, Ser 12/03/2015 0.62  0.44 - 1.00 mg/dL Final  . Calcium 12/03/2015 9.1  8.9 - 10.3 mg/dL Final  . GFR calc non Af Amer 12/03/2015 >60  >60 mL/min Final  . GFR calc Af Amer 12/03/2015 >60  >60 mL/min Final   Comment: (NOTE) The eGFR has been calculated using the CKD EPI equation. This calculation has not been validated in all clinical situations. eGFR's persistently <60 mL/min signify possible Chronic Kidney Disease.   Georgiann Hahn gap 12/03/2015 8  5 - 15 Final  Hospital Outpatient Visit on 11/23/2015  Component Date Value Ref Range Status  . aPTT 11/23/2015 30  24 - 36 seconds Final  . WBC 11/23/2015 6.2  4.0 - 10.5 K/uL Final  . RBC 11/23/2015 4.46  3.87 - 5.11 MIL/uL Final  . Hemoglobin 11/23/2015 13.1  12.0 - 15.0 g/dL Final  . HCT 11/23/2015 41.2  36.0 -  46.0 % Final  . MCV 11/23/2015 92.4  78.0 - 100.0 fL Final  . MCH 11/23/2015 29.4  26.0 - 34.0 pg Final  . MCHC 11/23/2015 31.8  30.0 - 36.0 g/dL Final  . RDW  11/23/2015 13.8  11.5 - 15.5 % Final  . Platelets 11/23/2015 320  150 - 400 K/uL Final  . Sodium 11/23/2015 135  135 - 145 mmol/L Final  . Potassium 11/23/2015 4.3  3.5 - 5.1 mmol/L Final  . Chloride 11/23/2015 102  101 - 111 mmol/L Final  . CO2 11/23/2015 28  22 - 32 mmol/L Final  . Glucose, Bld 11/23/2015 80  65 - 99 mg/dL Final  . BUN 11/23/2015 16  6 - 20 mg/dL Final  . Creatinine, Ser 11/23/2015 0.85  0.44 - 1.00 mg/dL Final  . Calcium 11/23/2015 9.1  8.9 - 10.3 mg/dL Final  . Total Protein 11/23/2015 7.2  6.5 - 8.1 g/dL Final  . Albumin 11/23/2015 4.1  3.5 - 5.0 g/dL Final  . AST 11/23/2015 20  15 - 41 U/L Final  . ALT 11/23/2015 14  14 - 54 U/L Final  . Alkaline Phosphatase 11/23/2015 72  38 - 126 U/L Final  . Total Bilirubin 11/23/2015 1.0  0.3 - 1.2 mg/dL Final  . GFR calc non Af Amer 11/23/2015 >60  >60 mL/min Final  . GFR calc Af Amer 11/23/2015 >60  >60 mL/min Final   Comment: (NOTE) The eGFR has been calculated using the CKD EPI equation. This calculation has not been validated in all clinical situations. eGFR's persistently <60 mL/min signify possible Chronic Kidney Disease.   . Anion gap 11/23/2015 5  5 - 15 Final  . Prothrombin Time 11/23/2015 13.9  11.4 - 15.2 seconds Final  . INR 11/23/2015 1.07   Final  . ABO/RH(D) 11/30/2015 O POS   Final  . Antibody Screen 11/30/2015 NEG   Final  . Sample Expiration 11/30/2015 12/03/2015   Final  . Extend sample reason 11/30/2015 NO TRANSFUSIONS OR PREGNANCY IN THE PAST 3 MONTHS   Final  . Color, Urine 11/23/2015 YELLOW  YELLOW Final  . APPearance 11/23/2015 CLEAR  CLEAR Final  . Specific Gravity, Urine 11/23/2015 1.006  1.005 - 1.030 Final  . pH 11/23/2015 6.0  5.0 - 8.0 Final  . Glucose, UA 11/23/2015 NEGATIVE  NEGATIVE mg/dL Final  . Hgb urine dipstick 11/23/2015  NEGATIVE  NEGATIVE Final  . Bilirubin Urine 11/23/2015 NEGATIVE  NEGATIVE Final  . Ketones, ur 11/23/2015 NEGATIVE  NEGATIVE mg/dL Final  . Protein, ur 11/23/2015 NEGATIVE  NEGATIVE mg/dL Final  . Nitrite 11/23/2015 NEGATIVE  NEGATIVE Final  . Leukocytes, UA 11/23/2015 NEGATIVE  NEGATIVE Final  . MRSA, PCR 11/23/2015 NEGATIVE  NEGATIVE Final  . Staphylococcus aureus 11/23/2015 NEGATIVE  NEGATIVE Final   Comment:        The Xpert SA Assay (FDA approved for NASAL specimens in patients over 57 years of age), is one component of a comprehensive surveillance program.  Test performance has been validated by [facility] for patients greater than or equal to 53 year old. It is not intended to diagnose infection nor to guide or monitor treatment.   . ABO/RH(D) 11/23/2015 O POS   Final  Appointment on 10/28/2015  Component Date Value Ref Range Status  . Rest HR 10/29/2015 88  bpm Final  . Rest BP 10/29/2015 144/90  mmHg Final  . Peak HR 10/29/2015 115  bpm Final  . Peak BP 10/29/2015 127/93  mmHg Final  . SSS 10/29/2015 9   Final  . SRS 10/29/2015 5   Final  . SDS 10/29/2015 4   Final  . LHR 10/29/2015 0.25   Final  . TID 10/29/2015 0.78   Final  .  LV sys vol 10/29/2015 21  mL Final  . LV dias vol 10/29/2015 62  46 - 106 mL Final     X-Rays:No results found.  EKG: Orders placed or performed during the hospital encounter of 11/30/15  . EKG 12-Lead  . EKG 12-Lead  . EKG 12-Lead  . EKG 12-Lead  . EKG 12-Lead  . EKG 12-Lead     Hospital Course: Nicole Graves is a 70 y.o. who was admitted to [facility]. They were brought to the operating room on 11/30/2015 and underwent Procedure(s): RIGHT TOTAL KNEE ARTHROPLASTY.  Patient tolerated the procedure well and was later transferred to the recovery room and then to the orthopaedic floor for postoperative care.  They were given PO and IV analgesics for pain control following their surgery.  They were given 24 hours of  postoperative antibiotics of  Anti-infectives    Start     Dose/Rate Route Frequency Ordered Stop   12/01/15 0600  ceFAZolin (ANCEF) IVPB 2g/100 mL premix     2 g 200 mL/hr over 30 Minutes Intravenous On call to O.R. 11/30/15 1252 11/30/15 1327   11/30/15 2000  ceFAZolin (ANCEF) IVPB 2g/100 mL premix     2 g 200 mL/hr over 30 Minutes Intravenous Every 6 hours 11/30/15 1646 12/01/15 0219     and started on DVT prophylaxis in the form of Xarelto.   PT and OT were ordered for total joint protocol.  Discharge planning consulted to help with postop disposition and equipment needs.  Patient had a decent night on the evening of surgery.  They started to get up OOB with therapy on day one. Hemovac drain was pulled without difficulty.  Patient seen in rounds for Dr. Wynelle Link. Slight increased HR.  Negative on fluids.  Continued to work with therapy into day two.  Dressing was changed on day two and the incision was healing well. Patient seen in rounds with Dr. Wynelle Link.  HR fast day before, better that morning.  By day three, the patient had progressed with therapy and meeting their goals.  Incision was healing well.  Patient was seen in rounds and was ready to go home.  Discharge home with home health Diet - Cardiac diet Follow up - in 2 weeks on Thursday 12/17/2015 with Dr. Wynelle Link. Activity - WBAT Disposition - Home Condition Upon Discharge - Stable D/C Meds - See DC Summary DVT Prophylaxis - Xarelto  Discharge Instructions    Call MD / Call 911    Complete by:  As directed    If you experience chest pain or shortness of breath, CALL 911 and be transported to the hospital emergency room.  If you develope a fever above 101 F, pus (white drainage) or increased drainage or redness at the wound, or calf pain, call your surgeon's office.   Change dressing    Complete by:  As directed    Change dressing daily with sterile 4 x 4 inch gauze dressing and apply TED hose. Do not submerge the incision under  water.   Constipation Prevention    Complete by:  As directed    Drink plenty of fluids.  Prune juice may be helpful.  You may use a stool softener, such as Colace (over the counter) 100 mg twice a day.  Use MiraLax (over the counter) for constipation as needed.   Diet - low sodium heart healthy    Complete by:  As directed    Discharge instructions    Complete  by:  As directed    Pick up stool softner and laxative for home use following surgery while on pain medications. Do not submerge incision under water. Please use good hand washing techniques while changing dressing each day. May shower starting three days after surgery. Please use a clean towel to pat the incision dry following showers. Continue to use ice for pain and swelling after surgery. Do not use any lotions or creams on the incision until instructed by your surgeon.   Postoperative Constipation Protocol  Constipation - defined medically as fewer than three stools per week and severe constipation as less than one stool per week.  One of the most common issues patients have following surgery is constipation.  Even if you have a regular bowel pattern at home, your normal regimen is likely to be disrupted due to multiple reasons following surgery.  Combination of anesthesia, postoperative narcotics, change in appetite and fluid intake all can affect your bowels.  In order to avoid complications following surgery, here are some recommendations in order to help you during your recovery period.  Colace (docusate) - Pick up an over-the-counter form of Colace or another stool softener and take twice a day as long as you are requiring postoperative pain medications.  Take with a full glass of water daily.  If you experience loose stools or diarrhea, hold the colace until you stool forms back up.  If your symptoms do not get better within 1 week or if they get worse, check with your doctor.  Dulcolax (bisacodyl) - Pick up  over-the-counter and take as directed by the product packaging as needed to assist with the movement of your bowels.  Take with a full glass of water.  Use this product as needed if not relieved by Colace only.   MiraLax (polyethylene glycol) - Pick up over-the-counter to have on hand.  MiraLax is a solution that will increase the amount of water in your bowels to assist with bowel movements.  Take as directed and can mix with a glass of water, juice, soda, coffee, or tea.  Take if you go more than two days without a movement. Do not use MiraLax more than once per day. Call your doctor if you are still constipated or irregular after using this medication for 7 days in a row.  If you continue to have problems with postoperative constipation, please contact the office for further assistance and recommendations.  If you experience "the worst abdominal pain ever" or develop nausea or vomiting, please contact the office immediatly for further recommendations for treatment.   Take Xarelto for two and a half more weeks, then discontinue Xarelto. Once the patient has completed the blood thinner regimen, then take a Baby 81 mg Aspirin daily for three more weeks.   Do not put a pillow under the knee. Place it under the heel.    Complete by:  As directed    Do not sit on low chairs, stoools or toilet seats, as it may be difficult to get up from low surfaces    Complete by:  As directed    Driving restrictions    Complete by:  As directed    No driving until released by the physician.   Increase activity slowly as tolerated    Complete by:  As directed    Lifting restrictions    Complete by:  As directed    No lifting until released by the physician.   Patient may shower  Complete by:  As directed    You may shower without a dressing once there is no drainage.  Do not wash over the wound.  If drainage remains, do not shower until drainage stops.   TED hose    Complete by:  As directed    Use  stockings (TED hose) for 3 weeks on both leg(s).  You may remove them at night for sleeping.   Weight bearing as tolerated    Complete by:  As directed    Laterality:  right   Extremity:  Lower       Medication List    STOP taking these medications   CELEBREX 200 MG capsule Generic drug:  celecoxib   cyclobenzaprine 10 MG tablet Commonly known as:  FLEXERIL   diclofenac sodium 1 % Gel Commonly known as:  VOLTAREN   oxyCODONE-acetaminophen 5-325 MG tablet Commonly known as:  PERCOCET   Vitamin D 2000 units Caps     TAKE these medications   atorvastatin 10 MG tablet Commonly known as:  LIPITOR Take 1 tablet by mouth at bedtime.   buPROPion 300 MG 24 hr tablet Commonly known as:  WELLBUTRIN XL Take 300 mg by mouth daily after breakfast.   DULoxetine 30 MG capsule Commonly known as:  CYMBALTA Take 90 mg by mouth daily after breakfast.   loratadine 10 MG tablet Commonly known as:  CLARITIN Take 10 mg by mouth daily at 6 (six) AM.   lurasidone 40 MG Tabs tablet Commonly known as:  [name] Take 40 mg by mouth daily at 6 PM.   Melatonin 5 MG Tabs Take 5 mg by mouth at bedtime.   methocarbamol 500 MG tablet Commonly known as:  ROBAXIN Take 1 tablet (500 mg total) by mouth every 6 (six) hours as needed for muscle spasms.   metoprolol succinate 25 MG 24 hr tablet Commonly known as:  TOPROL-XL Take 1 tablet (25 mg total) by mouth at bedtime and may repeat dose one time if needed.   omeprazole 20 MG capsule Commonly known as:  PRILOSEC Take 20 mg by mouth daily at 6 (six) AM.   ondansetron 4 MG tablet Commonly known as:  ZOFRAN Take 1 tablet (4 mg total) by mouth every 6 (six) hours as needed for nausea.   oxyCODONE 5 MG immediate release tablet Commonly known as:  Oxy IR/ROXICODONE Take 1-2 tablets (5-10 mg total) by mouth every 3 (three) hours as needed for moderate pain or severe pain.   rivaroxaban 10 MG Tabs tablet Commonly known as:  XARELTO Take 1  tablet (10 mg total) by mouth daily with breakfast. Take Xarelto for two and a half more weeks, then discontinue Xarelto. Once the patient has completed the blood thinner regimen, then take a Baby 81 mg Aspirin daily for three more weeks.   traMADol 50 MG tablet Commonly known as:  ULTRAM Take 100 mg by mouth at bedtime.      Follow-up Information    [facility] .   Specialty:  Home Health Services Why:  home health physical therapy Contact information: [address] [place] [address] [phone]        Inc[facility] - Dme [facility] .   Why:  3n1 (over the commode seat) Contact information: [address] [place] [phone]        ALUISIO,FRANK V, MD. Schedule an appointment as soon as possible for a visit on 12/17/2015.   Specialty:  Orthopedic Surgery  Why:  Call for appointment on Thursday 12/17/2015 with Dr. Wynelle Link. Contact information: [address] [place] [address] [phone]           Signed: Arlee Muslim, PA-C Orthopaedic Surgery 12/03/2015, 7:55 AM

## 2016-01-27 ENCOUNTER — Other Ambulatory Visit: Payer: Self-pay

## 2016-01-27 ENCOUNTER — Ambulatory Visit (HOSPITAL_COMMUNITY): Payer: Medicare Other | Attending: Cardiology

## 2016-01-27 DIAGNOSIS — I350 Nonrheumatic aortic (valve) stenosis: Secondary | ICD-10-CM

## 2016-01-27 DIAGNOSIS — I517 Cardiomegaly: Secondary | ICD-10-CM | POA: Insufficient documentation

## 2016-01-27 DIAGNOSIS — I340 Nonrheumatic mitral (valve) insufficiency: Secondary | ICD-10-CM | POA: Diagnosis not present

## 2016-01-27 DIAGNOSIS — I352 Nonrheumatic aortic (valve) stenosis with insufficiency: Secondary | ICD-10-CM | POA: Insufficient documentation

## 2016-01-27 DIAGNOSIS — I272 Pulmonary hypertension, unspecified: Secondary | ICD-10-CM | POA: Insufficient documentation

## 2016-01-29 ENCOUNTER — Encounter: Payer: Self-pay | Admitting: Cardiology

## 2016-02-01 ENCOUNTER — Telehealth: Payer: Self-pay

## 2016-02-01 DIAGNOSIS — I350 Nonrheumatic aortic (valve) stenosis: Secondary | ICD-10-CM

## 2016-02-01 NOTE — Telephone Encounter (Signed)
-----   Message from Quintella Reichertraci R Turner, MD sent at 01/29/2016  9:02 AM EST ----- Echo showed normal LVF with mildly thickened heart muscle, increased stiffness of the heart muscle which is normal for her age, mild to moderate AS, mild MR, mild pulmonary HTN - repeat echo in 1 year for AS

## 2016-02-01 NOTE — Telephone Encounter (Signed)
Repeat ECHO ordered to be scheduled in 1 year. 

## 2016-02-24 ENCOUNTER — Telehealth: Payer: Self-pay | Admitting: Cardiology

## 2016-02-24 DIAGNOSIS — R002 Palpitations: Secondary | ICD-10-CM

## 2016-02-24 NOTE — Telephone Encounter (Signed)
Patient called to report increased palpitations over the last several weeks. She states they are noticeable when she lay down to sleep. The palpitations are accompanied by mild chest heaviness and SOB. CP and SOB do not occur unless she has the palpitations. She has no VS to report. She is taking her medications as instructed.  She would like to know if Dr. Mayford Knifeurner would like to increase Toprol dose or if she has any other recommendations.  To Dr. Mayford Knifeurner.

## 2016-02-24 NOTE — Telephone Encounter (Signed)
Event monitor ordered for scheduling. Patient agrees with treatment plan.  

## 2016-02-24 NOTE — Telephone Encounter (Signed)
Recent stress test was normal. Please get a 30 day heart monitor

## 2016-02-24 NOTE — Telephone Encounter (Signed)
New Message  Patient c/o Palpitations:  High priority if patient c/o lightheadedness and shortness of breath.  1. How long have you been having palpitations? Couple of weeks  2. Are you currently experiencing lightheadedness and shortness of breath? Yes  3. Have you checked your BP and heart rate? (document readings) No  4. Are you experiencing any other symptoms? No  Pt c/o medication issue:  1. Name of Medication: metoprolol succinate (TOPROL-XL) 25 mg 24 hr tablet once daily  2. How are you currently taking this medication (dosage and times per day)? See above  3. Are you having a reaction (difficulty breathing--STAT)? N/A  4. What is your medication issue? Pt voiced she is having rapid heartbeat/palpitations and she thinks it has something to do with her medication and her dosage needs increasing.  Please f/u

## 2016-02-29 ENCOUNTER — Ambulatory Visit (INDEPENDENT_AMBULATORY_CARE_PROVIDER_SITE_OTHER): Payer: Medicare Other

## 2016-02-29 DIAGNOSIS — R002 Palpitations: Secondary | ICD-10-CM | POA: Diagnosis not present

## 2016-08-31 ENCOUNTER — Telehealth: Payer: Self-pay

## 2016-08-31 NOTE — Telephone Encounter (Signed)
Per Dr. Mayford Knifeurner, called patient to see if she is having symptoms and to inform her of needed ECHO for surgical clearance (received by [facility]). Patient reports no CP or SOB. She understands she will be called to have ECHO rescheduled to prior to OV with Dr. Mayford Knifeurner. She was grateful for call.

## 2016-09-01 NOTE — Telephone Encounter (Signed)
ECHO has been rescheduled to 7/30.

## 2016-09-12 ENCOUNTER — Other Ambulatory Visit: Payer: Self-pay

## 2016-09-12 ENCOUNTER — Ambulatory Visit (HOSPITAL_COMMUNITY): Payer: Medicare Other | Attending: Internal Medicine

## 2016-09-12 DIAGNOSIS — I083 Combined rheumatic disorders of mitral, aortic and tricuspid valves: Secondary | ICD-10-CM | POA: Diagnosis not present

## 2016-09-12 DIAGNOSIS — E669 Obesity, unspecified: Secondary | ICD-10-CM | POA: Insufficient documentation

## 2016-09-12 DIAGNOSIS — I350 Nonrheumatic aortic (valve) stenosis: Secondary | ICD-10-CM

## 2016-09-12 DIAGNOSIS — Z6832 Body mass index (BMI) 32.0-32.9, adult: Secondary | ICD-10-CM | POA: Diagnosis not present

## 2016-09-19 ENCOUNTER — Encounter (INDEPENDENT_AMBULATORY_CARE_PROVIDER_SITE_OTHER): Payer: Self-pay

## 2016-09-19 ENCOUNTER — Ambulatory Visit (INDEPENDENT_AMBULATORY_CARE_PROVIDER_SITE_OTHER): Payer: Medicare Other | Admitting: Cardiology

## 2016-09-19 ENCOUNTER — Encounter: Payer: Self-pay | Admitting: Cardiology

## 2016-09-19 VITALS — BP 132/86 | HR 69 | Ht 65.0 in | Wt 187.8 lb

## 2016-09-19 DIAGNOSIS — I071 Rheumatic tricuspid insufficiency: Secondary | ICD-10-CM | POA: Diagnosis not present

## 2016-09-19 DIAGNOSIS — R200 Anesthesia of skin: Secondary | ICD-10-CM | POA: Diagnosis not present

## 2016-09-19 DIAGNOSIS — I350 Nonrheumatic aortic (valve) stenosis: Secondary | ICD-10-CM

## 2016-09-19 DIAGNOSIS — I351 Nonrheumatic aortic (valve) insufficiency: Secondary | ICD-10-CM

## 2016-09-19 DIAGNOSIS — R202 Paresthesia of skin: Secondary | ICD-10-CM

## 2016-09-19 DIAGNOSIS — I471 Supraventricular tachycardia: Secondary | ICD-10-CM | POA: Diagnosis not present

## 2016-09-19 HISTORY — DX: Rheumatic tricuspid insufficiency: I07.1

## 2016-09-19 NOTE — Telephone Encounter (Signed)
Patient is low risk from a cardiac standpoint for TKR.  Please see OV note from 09/19/2016 for details

## 2016-09-19 NOTE — Progress Notes (Signed)
Cardiology Office Note:    Date:  09/19/2016   ID:  Nicole Graves, DOB March 05, 1945, MRN [id number]  PCP:  [name], MD  Cardiologist:  [name], MD   Referring MD: [name], MD   Chief Complaint  Patient presents with  . Aortic Stenosis    History of Present Illness:    Nicole Graves is a 71 y.o. female with a hx of mild AS, moderate AI and SVT .  SHe is here today for followup and is doing well.  She denies any chest pain of pressure, LE edema, dizziness (except for vertigo) or syncope.  She says that last week she had some problems with palpitations and took an extra Toprol and it stopped after about 40 minutes.  This was the first time in a long time. Rarely she will have some SOB at rest which she thinks is related to stress/anxiety.  She has no DOE, PND or orthopnea.   She says that she is having a TKR in the fall and needs preoperative cardiac clearance.   Past Medical History:  Diagnosis Date  . Anemia   . Anginal pain (HCC) 01/2015  . Aortic stenosis    Echo 01/2016: mild LVH, vigorous LVF, no RWMA, Gr 1 DD, mild to moderate AS (mean 16 mmHg), no AI, mild LAE, PASP 42 mmHg  . Arthritis    Knees, neck, back  . Chronic lower back pain    with siatica  . Colon polyp 1999  . Depression   . Dysrhythmia   . GERD (gastroesophageal reflux disease)   . PONV (postoperative nausea and vomiting)   . SVT (supraventricular tachycardia) (HCC)   . TR (tricuspid regurgitation) 09/19/2016    Past Surgical History:  Procedure Laterality Date  . CESAREAN SECTION  1964   X2  . CHOLECYSTECTOMY    . COSMETIC SURGERY  1980   removal extra fat inner thighs  . EYE SURGERY Bilateral     Cataract extraction with IOL  . TOTAL KNEE ARTHROPLASTY Right 11/30/2015   Procedure: RIGHT TOTAL KNEE ARTHROPLASTY;  Surgeon: Ollen Gross, MD;  Location: [facility];  Service: Orthopedics;  Laterality: Right;    Current Medications: Current Meds  Medication Sig  . atorvastatin (LIPITOR) 10 MG  tablet Take 1 tablet by mouth at bedtime.   Marland Kitchen buPROPion (WELLBUTRIN XL) 300 MG 24 hr tablet Take 300 mg by mouth daily after breakfast.   . CALCIUM PO Take 600 mg by mouth daily.  . celecoxib (CELEBREX) 200 MG capsule Take 200 mg by mouth 2 (two) times daily.  . Cholecalciferol (VITAMIN D PO) Take by mouth.  . diclofenac sodium (VOLTAREN) 1 % GEL APPLY 2 GRAMS TO AFFECTED AREA 2-3 TIMES DAILY PRN  . DULoxetine (CYMBALTA) 30 MG capsule Take 90 mg by mouth daily after breakfast.   . loratadine (CLARITIN) 10 MG tablet Take 10 mg by mouth daily at 6 (six) AM.  . lurasidone ([name]) 40 MG TABS tablet Take 40 mg by mouth daily at 6 PM.   . Melatonin 5 MG TABS Take 5 mg by mouth at bedtime.  . metoprolol succinate (TOPROL-XL) 25 MG 24 hr tablet Take 1 tablet (25 mg total) by mouth at bedtime and may repeat dose one time if needed.  Marland Kitchen omeprazole (PRILOSEC) 20 MG capsule Take 20 mg by mouth daily at 6 (six) AM.   . traMADol (ULTRAM) 50 MG tablet Take 100 mg by mouth at bedtime.      Allergies:  Sulfa antibiotics   Social History   Social History  . Marital status: Divorced    Spouse name: N/A  . Number of children: N/A  . Years of education: N/A   Social History Main Topics  . Smoking status: Never Smoker  . Smokeless tobacco: Never Used  . Alcohol use No  . Drug use: No  . Sexual activity: Not on file   Other Topics Concern  . Not on file   Social History Narrative  . No narrative on file     Family History: The patient's family history includes Colon cancer in her mother; Heart attack in her brother; Hypertension in her brother. There is no history of Stroke.  ROS:   Please see the history of present illness.     All other systems reviewed and are negative.  EKGs/Labs/Other Studies Reviewed:    The following studies were reviewed today: none  EKG:  EKG is  ordered today.  The ekg ordered today demonstrates NSR with no ST changes  Recent Labs: 11/23/2015: ALT  14 12/02/2015: TSH 0.311 12/03/2015: BUN 17; Creatinine, Ser 0.62; Hemoglobin 10.4; Platelets 277; Potassium 4.0; Sodium 136   Recent Lipid Panel No results found for: CHOL, TRIG, HDL, CHOLHDL, VLDL, LDLCALC, LDLDIRECT  Physical Exam:    VS:  BP 132/86   Pulse 69   Ht 5\' 5"  (1.651 m)   Wt 187 lb 12.8 oz (85.2 kg)   BMI 31.25 kg/m     Wt Readings from Last 3 Encounters:  09/19/16 187 lb 12.8 oz (85.2 kg)  11/30/15 196 lb (88.9 kg)  11/23/15 196 lb (88.9 kg)     GEN:  Well nourished, well developed in no acute distress HEENT: Normal NECK: No JVD; No carotid bruits LYMPHATICS: No lymphadenopathy CARDIAC: RRR, no murmurs, rubs, gallops RESPIRATORY:  Clear to auscultation without rales, wheezing or rhonchi  ABDOMEN: Soft, non-tender, non-distended MUSCULOSKELETAL:  No edema; No deformity  SKIN: Warm and dry NEUROLOGIC:  Alert and oriented x 3 PSYCHIATRIC:  Normal affect   ASSESSMENT:    1. Nonrheumatic aortic valve stenosis   2. Nonrheumatic aortic valve insufficiency   3. SVT (supraventricular tachycardia) (HCC)   4. Tricuspid valve insufficiency, unspecified etiology   5. Numbness and tingling of leg    PLAN:    In order of problems listed above:  1. Mild AS - repeat echo last month showed stable mild AS. 2. Mild AI - echo last months showed only mlld AI 3. SVT - she had one reoccurrence of her SVT recently that stopped with an extra Toprol.  I encouraged her to take an extra Toprol if she has breakthrough.  If her episodes become more frequent then she should call and we will increase her Toprol daily dose.  4. Tricuspid Regurgitation - moderate by echo 08/2016 - repeat echo in 1 year.  5. LE tingling of her feet after standing for prolonged periods.  Her pulses are fairly good but I will get LE arterial dopplers to make sure that she does not have PAD.  She is scheduled to have TKR this fall.  She is stable from a cardiac standpoint with no anginal symptoms.  Her SVT  is well controlled.  She had a stress test a year ago that showed no ischemia and 2D echo showed mild AS and mild AR recently.  She is low risk for orthopedic surgery.    Medication Adjustments/Labs and Tests Ordered: Current medicines are reviewed at length with the patient  today.  Concerns regarding medicines are outlined above.  No orders of the defined types were placed in this encounter.  No orders of the defined types were placed in this encounter.   Signed, Armanda Magicraci Maryori Weide, MD  09/19/2016 8:19 AM    [facility]

## 2016-09-19 NOTE — Telephone Encounter (Signed)
Phone encounter and note printed and placed in Medical Records' "to be faxed" bin.

## 2016-09-19 NOTE — Patient Instructions (Signed)
Medication Instructions:  Your physician recommends that you continue on your current medications as directed. Please refer to the Current Medication list given to you today.   Labwork: None  Testing/Procedures: Your physician has requested that you have a lower extremity arterial exercise duplex. During this test, ultrasound is used to evaluate arterial blood flow in the legs. Allow one hour for this exam. There are no restrictions or special instructions.  Your physician has requested that you have an echocardiogram in July, 2019. Echocardiography is a painless test that uses sound waves to create images of your heart. It provides your doctor with information about the size and shape of your heart and how well your heart's chambers and valves are working. This procedure takes approximately one hour. There are no restrictions for this procedure.  Follow-Up: Your physician wants you to follow-up in: 1 year with Dr. Mayford Knifeurner. You will receive a reminder letter in the mail two months in advance. If you don't receive a letter, please call our office to schedule the follow-up appointment.   Any Other Special Instructions Will Be Listed Below (If Applicable).     If you need a refill on your cardiac medications before your next appointment, please call your pharmacy.

## 2016-09-28 ENCOUNTER — Telehealth: Payer: Self-pay | Admitting: Cardiology

## 2016-09-28 NOTE — Telephone Encounter (Signed)
New message        [facility] Pre-operative Risk Assessment    Request for surgical clearance:  1. What type of surgery is being performed? Total knee  2. When is this surgery scheduled?  October 1st 2018  3. Are there any medications that need to be held prior to surgery and how long? No surgical clearance  4. Name of physician performing surgery?  Dr Sigmund HazelLisa Miller   What is your office phone and fax number? Fax [phone] ofc [phone] Berle Mullorma J Miller 09/28/2016, 10:37 AM  _________________________________________________________________   (provider comments below)

## 2016-09-28 NOTE — Telephone Encounter (Signed)
Clearance was given at most recent OV.  Dr. Norris Crossurner's note faxed to Dr. Hyacinth MeekerMiller.

## 2016-10-05 ENCOUNTER — Telehealth: Payer: Self-pay | Admitting: Cardiology

## 2016-10-05 NOTE — Telephone Encounter (Signed)
Reiterated to Dr. Rondel Baton office that clearance has been faxed to [facility].  Called and left message at [facility] to call if clearance has not been received. Clearance faxed again to fax: [phone] attn: Meredith Leeds

## 2016-10-05 NOTE — Telephone Encounter (Signed)
New Message       [facility] Pre-operative Risk Assessment    Request for surgical clearance:  1. What type of surgery is being performed? Total knee   2. When is this surgery scheduled?  October   3. Are there any medications that need to be held prior to surgery and how long? none   Have you faxed back the form yet for surgical clearance?  Berle Mull 10/05/2016, 3:37 PM  _________________________________________________________________   (provider comments below)

## 2016-10-23 ENCOUNTER — Ambulatory Visit: Payer: Self-pay | Admitting: Orthopedic Surgery

## 2016-10-23 NOTE — H&P (Signed)
Dalbert Garnet DOB: March 03, 1945 Divorced / Language: [place] / Race: White Female Date of Admission:  11/14/2016 CC:  Left Knee Pain History of Present Illness  The patient is a 71 year old female who comes in for a preoperative History and Physical. The patient is scheduled for a left total knee arthroplasty to be performed by Dr. Gus Rankin. Aluisio, MD at [facility] on 11/14/2016. The patient is a 71 year old female who presented for follow up of their knee. The patient is being followed for their left knee pain and osteoarthritis. They are now month(s) out from last cortisone injection. Symptoms reported include: pain, pain after sitting, swelling, popping, grinding, giving way, instability and difficulty ambulating. The patient feels that they are doing poorly and report their pain level to be moderate to severe. The following medication has been used for pain control: Tramadol and Voltaren. The patient has reported symptom improvement with: Cortisone injections while they have not gotten any relief of their symptoms with: bracing or viscosupplementation. She said that right knee is doing fantastic at this time. She is not having any pain in the right knee. She will be a year out in October. Left knee is becoming more problematic. It is hurting her at all times. She has had progressive valgus deformity. She is having pain in day and night. She has had cortisone in the past, which just provided temporary relief. She has reached a point where she would like to get the knee replaced. They have been treated conservatively in the past for the above stated problem and despite conservative measures, they continue to have progressive pain and severe functional limitations and dysfunction. They have failed non-operative management including home exercise, medications, and injections. It is felt that they would benefit from undergoing total joint replacement. Risks and benefits of the procedure have been  discussed with the patient and they elect to proceed with surgery. There are no active contraindications to surgery such as ongoing infection or rapidly progressive neurological disease.    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  Family History Heart disease in female family member before age 62  Hypertension  Mother. Osteoarthritis  Brother, Mother, Sister. Heart disease in female family member before age 97  Osteoporosis  Sister. First Degree Relatives  reported Cancer  Brother, Mother. Depression  Mother, Sister. Chronic Obstructive Lung Disease  Mother. Congestive Heart Failure  Mother. Diabetes Mellitus  Sister.  Social History Marital status  divorced Current work status  retired Tobacco use  Never smoker. 03/28/2013 Current drinker  03/28/2013: Currently drinks only occasionally per week Children  2 Living situation  live alone Number of flights of stairs before winded  1 No history of drug/alcohol rehab  Under pain contract  Exercise  Exercises never; does other Tobacco / smoke exposure  03/28/2013: no Post-Surgical Plans  Home with HHPT. Home with Daughter Advance Directives  Living Will  Medication History  Calcium Active. CeleBREX (  Capsule, Oral) Active. Omeprazole (  Capsule ER, Oral) Active.  Vitamin D (2000UNIT Tablet, Oral) Active. Claritin  (  Tablet, Oral) Active. (generic) BuPROPion HCl ER (XL) (  Tablet ER 24HR, Oral) Active. Cymbalta (  Capsule DR Part, Oral) Active. [name] (  Tablet, Oral) Active. Atorvastatin Calcium (  Tablet, Oral) Active. Cyclobenzaprine HCl (  Tablet, Oral) Active. Melatonin (  Tablet, Oral) Active. Metoprolol Succinate ER (  Tablet ER 24HR, Oral) Active. TraMADol HCl (  Tablet, Oral) Active. Voltaren Gel Active. Aspirin (  Tablet, Oral) Active. Fosamax Active.   Past Surgical History  Tubal Ligation  Total Knee Replacement - Right [11/30/2015]: Other Orthopaedic Surgery  Gallbladder Surgery  Date: 1968. open Cataract Surgery  Date: 2012. bilateral Arthroscopy of Knee  right Colon Polyp Removal - Colonoscopy  Cesarean Delivery  2 times; 1967 and 1982   Review of Systems General Present- Fatigue. Not Present- Chills, Fever, Memory Loss, Night Sweats, Weight Gain and Weight Loss. Skin Not Present- Eczema, Hives, Itching, Lesions and Rash. HEENT Not Present- Dentures, Double Vision, Headache, Hearing Loss, Tinnitus and Visual Loss. Respiratory Not Present- Allergies, Chronic Cough, Coughing up blood, Shortness of breath at rest and Shortness of breath with exertion. Cardiovascular Present- Chest Pain, Irregular Heart Beat, Murmur, Palpitations, Racing/skipping heartbeats and Rapid Heart Rate. Not Present- Difficulty Breathing Lying Down and Swelling. Gastrointestinal Present- Constipation and Hemorrhoids. Not Present- Abdominal Pain, Bloody Stool, Diarrhea, Difficulty Swallowing, Heartburn, Jaundice, Loss of appetitie, Nausea and Vomiting. Female Genitourinary Not Present- Blood in Urine, Discharge, Flank Pain, Incontinence, Painful Urination, Urgency, Urinary frequency, Urinary Retention, Urinating at Night and Weak urinary stream. Musculoskeletal Present- Back Pain, Joint Pain, Joint Stiffness, Leg Cramps, Morning Stiffness and Muscle Cramps. Not  Present- Joint Swelling, Muscle Pain, Muscle Weakness and Spasms. Neurological Present- Difficulty with balance and Vertigo. Not Present- Blackout spells, Dizziness, Paralysis, Tremor and Weakness. Psychiatric Present- Anxiety and Depression. Not Present- Insomnia.  Vitals Weight: 187 lb Height: 65in Weight was reported by patient. Height was reported by patient. Body Surface Area: 1.92 m Body Mass Index: 31.12 kg/m  Pulse: 68 (Regular)  BP: 124/78 (Sitting, Right Arm, Standard)       Physical Exam  General Mental Status -Alert, cooperative and good historian. General Appearance-pleasant, Not in acute distress. Orientation-Oriented X3. Build & Nutrition-Well nourished and Well developed.  Head and Neck Head-normocephalic, atraumatic . Neck Global Assessment - supple, no bruit auscultated on the right, no bruit auscultated on the left.  Eye Pupil - Bilateral-Regular and Round. Motion - Bilateral-EOMI.  Chest and Lung Exam Auscultation Breath sounds - clear at anterior chest wall and clear at posterior chest wall. Adventitious sounds - No Adventitious sounds.  Cardiovascular Auscultation Rhythm - Regular rate and rhythm. Heart Sounds - S1 WNL and S2 WNL. Murmurs & Other Heart Sounds: Murmur 1 - Location - Aortic Area, Carotids and Sternal Border - Left(upper edge). Timing - Holosystolic. Grade - III/VI. Character - Holosystolic.  Abdomen Palpation/Percussion Tenderness - Abdomen is non-tender to palpation. Rigidity (guarding) - Abdomen is soft. Auscultation Auscultation of the abdomen reveals - Bowel sounds normal.  Female Genitourinary Note: Not done, not pertinent to present illness   Musculoskeletal Note: She is in no distress. Her left knee shows valgus deformity. There is no effusion. Range is 5 to 130. She is tender lateral greater than medial with no instability. Right knee, no effusion. Range of motion right knee 0 to 130 with no  tenderness or instability.  RADIOGRAPHS AP and lateral of both knee show the prosthesis on the right is in excellent position with no periprosthetic abnormalities. On the left, she  has severe bone on bone arthritis, lateral and patellofemoral compartments with about a 10 degree valgus deformity.     Assessment & Plan  Primary osteoarthritis of left knee (M17.12) Status post total right knee replacement (Z61.096(Z96.651)  Note:Surgical Plans: Left Total Knee Replacement  Disposition: Home with daughter, HHPT  PCP: Dr. Sigmund HazelLisa Miller Cards: Dr. Mayford Knifeurner - Patient has been seen preoperatively and felt to be stable for surgery.  IV TXA  Anesthesia Issues: None  Patient was instructed on what medications to stop prior to surgery.  Signed electronically by Lauraine RinneAlexzandrew L Loretto Belinsky, III PA-C

## 2016-10-26 ENCOUNTER — Other Ambulatory Visit: Payer: Self-pay | Admitting: Cardiology

## 2016-10-26 DIAGNOSIS — R202 Paresthesia of skin: Principal | ICD-10-CM

## 2016-10-26 DIAGNOSIS — R200 Anesthesia of skin: Secondary | ICD-10-CM

## 2016-11-02 NOTE — Progress Notes (Signed)
Need ordersin epic.  Preop on 9/24  

## 2016-11-03 ENCOUNTER — Ambulatory Visit: Payer: Self-pay | Admitting: Orthopedic Surgery

## 2016-11-04 ENCOUNTER — Ambulatory Visit (HOSPITAL_COMMUNITY)
Admission: RE | Admit: 2016-11-04 | Discharge: 2016-11-04 | Disposition: A | Discharge status: Home / Self Care | Payer: Medicare Other | Source: Ambulatory Visit | Attending: Cardiovascular Disease | Admitting: Cardiovascular Disease

## 2016-11-04 DIAGNOSIS — R202 Paresthesia of skin: Secondary | ICD-10-CM | POA: Diagnosis not present

## 2016-11-04 DIAGNOSIS — R200 Anesthesia of skin: Secondary | ICD-10-CM | POA: Insufficient documentation

## 2016-11-04 NOTE — Patient Instructions (Addendum)
Nicole Graves  11/04/2016   Your procedure is scheduled on: 11-14-16  Report to [facility] Main  Entrance Report to Admitting at 5:50 AM   Call this number if you have problems the morning of surgery [phone]   Remember: ONLY 1 PERSON MAY GO WITH YOU TO SHORT STAY TO GET  READY MORNING OF YOUR SURGERY.  Do not eat food or drink liquids :After Midnight.     Take these medicines the morning of surgery with A SIP OF WATER: Celecoxib (Celebrex), Loratadine (Claritin), and Omeprazole (Prilosec)                                You may not have any metal on your body including hair pins and              piercings  Do not wear jewelry, make-up, lotions, powders or perfumes, deodorant             Do not wear nail polish.  Do not shave  48 hours prior to surgery.             Do not bring valuables to the hospital.  IS NOT             RESPONSIBLE   FOR VALUABLES.  Contacts, dentures or bridgework may not be worn into surgery.  Leave suitcase in the car. After surgery it may be brought to your room.               Please read over the following fact sheets you were given: _____________________________________________________________________             [facility] - Preparing for Surgery Before surgery, you can play an important role.  Because skin is not sterile, your skin needs to be as free of germs as possible.  You can reduce the number of germs on your skin by washing with CHG (chlorahexidine gluconate) soap before surgery.  CHG is an antiseptic cleaner which kills germs and bonds with the skin to continue killing germs even after washing. Please DO NOT use if you have an allergy to CHG or antibacterial soaps.  If your skin becomes reddened/irritated stop using the CHG and inform your nurse when you arrive at Short Stay. Do not shave (including legs and underarms) for at least 48 hours prior to the first CHG shower.  You may shave your  face/neck. Please follow these instructions carefully:  1.  Shower with CHG Soap the night before surgery and the  morning of Surgery.  2.  If you choose to wash your hair, wash your hair first as usual with your  normal  shampoo.  3.  After you shampoo, rinse your hair and body thoroughly to remove the  shampoo.                           4.  Use CHG as you would any other liquid soap.  You can apply chg directly  to the skin and wash                       Gently with a scrungie or clean washcloth.  5.  Apply the CHG Soap to your body ONLY FROM THE NECK DOWN.   Do not use  on face/ open                           Wound or open sores. Avoid contact with eyes, ears mouth and genitals (private parts).                       Wash face,  Genitals (private parts) with your normal soap.             6.  Wash thoroughly, paying special attention to the area where your surgery  will be performed.  7.  Thoroughly rinse your body with warm water from the neck down.  8.  DO NOT shower/wash with your normal soap after using and rinsing off  the CHG Soap.                9.  Pat yourself dry with a clean towel.            10.  Wear clean pajamas.            11.  Place clean sheets on your bed the night of your first shower and do not  sleep with pets. Day of Surgery : Do not apply any lotions/deodorants the morning of surgery.  Please wear clean clothes to the hospital/surgery center.  FAILURE TO FOLLOW THESE INSTRUCTIONS MAY RESULT IN THE CANCELLATION OF YOUR SURGERY PATIENT SIGNATURE_________________________________  NURSE SIGNATURE__________________________________  ________________________________________________________________________   Adam Phenix  An incentive spirometer is a tool that can help keep your lungs clear and active. This tool measures how well you are filling your lungs with each breath. Taking long deep breaths may help reverse or decrease the chance of developing breathing  (pulmonary) problems (especially infection) following:  A long period of time when you are unable to move or be active. BEFORE THE PROCEDURE   If the spirometer includes an indicator to show your best effort, your nurse or respiratory therapist will set it to a desired goal.  If possible, sit up straight or lean slightly forward. Try not to slouch.  Hold the incentive spirometer in an upright position. INSTRUCTIONS FOR USE  1. Sit on the edge of your bed if possible, or sit up as far as you can in bed or on a chair. 2. Hold the incentive spirometer in an upright position. 3. Breathe out normally. 4. Place the mouthpiece in your mouth and seal your lips tightly around it. 5. Breathe in slowly and as deeply as possible, raising the piston or the ball toward the top of the column. 6. Hold your breath for 3-5 seconds or for as long as possible. Allow the piston or ball to fall to the bottom of the column. 7. Remove the mouthpiece from your mouth and breathe out normally. 8. Rest for a few seconds and repeat Steps 1 through 7 at least 10 times every 1-2 hours when you are awake. Take your time and take a few normal breaths between deep breaths. 9. The spirometer may include an indicator to show your best effort. Use the indicator as a goal to work toward during each repetition. 10. After each set of 10 deep breaths, practice coughing to be sure your lungs are clear. If you have an incision (the cut made at the time of surgery), support your incision when coughing by placing a pillow or rolled up towels firmly against it. Once you are able to get out of bed, walk around  indoors and cough well. You may stop using the incentive spirometer when instructed by your caregiver.  RISKS AND COMPLICATIONS  Take your time so you do not get dizzy or light-headed.  If you are in pain, you may need to take or ask for pain medication before doing incentive spirometry. It is harder to take a deep breath if you  are having pain. AFTER USE  Rest and breathe slowly and easily.  It can be helpful to keep track of a log of your progress. Your caregiver can provide you with a simple table to help with this. If you are using the spirometer at home, follow these instructions: [place] IF:   You are having difficultly using the spirometer.  You have trouble using the spirometer as often as instructed.  Your pain medication is not giving enough relief while using the spirometer.  You develop fever of 100.5 F (38.1 C) or higher. SEEK IMMEDIATE MEDICAL CARE IF:   You cough up bloody sputum that had not been present before.  You develop fever of 102 F (38.9 C) or greater.  You develop worsening pain at or near the incision site. MAKE SURE YOU:   Understand these instructions.  Will watch your condition.  Will get help right away if you are not doing well or get worse. Document Released: 06/13/2006 Document Revised: 04/25/2011 Document Reviewed: 08/14/2006 Exit[employer] Patient Information 2014 [employer], [place].   ________________________________________________________________________  WHAT IS A BLOOD TRANSFUSION? Blood Transfusion Information  A transfusion is the replacement of blood or some of its parts. Blood is made up of multiple cells which provide different functions.  Red blood cells carry oxygen and are used for blood loss replacement.  White blood cells fight against infection.  Platelets control bleeding.  Plasma helps clot blood.  Other blood products are available for specialized needs, such as hemophilia or other clotting disorders. BEFORE THE TRANSFUSION  Who gives blood for transfusions?   Healthy volunteers who are fully evaluated to make sure their blood is safe. This is blood bank blood. Transfusion therapy is the safest it has ever been in the practice of medicine. Before blood is taken from a donor, a complete history is taken to make sure that person has  no history of diseases nor engages in risky social behavior (examples are intravenous drug use or sexual activity with multiple partners). The donor's travel history is screened to minimize risk of transmitting infections, such as malaria. The donated blood is tested for signs of infectious diseases, such as HIV and hepatitis. The blood is then tested to be sure it is compatible with you in order to minimize the chance of a transfusion reaction. If you or a relative donates blood, this is often done in anticipation of surgery and is not appropriate for emergency situations. It takes many days to process the donated blood. RISKS AND COMPLICATIONS Although transfusion therapy is very safe and saves many lives, the main dangers of transfusion include:   Getting an infectious disease.  Developing a transfusion reaction. This is an allergic reaction to something in the blood you were given. Every precaution is taken to prevent this. The decision to have a blood transfusion has been considered carefully by your caregiver before blood is given. Blood is not given unless the benefits outweigh the risks. AFTER THE TRANSFUSION  Right after receiving a blood transfusion, you will usually feel much better and more energetic. This is especially true if your red blood cells have gotten  low (anemic). The transfusion raises the level of the red blood cells which carry oxygen, and this usually causes an energy increase.  The nurse administering the transfusion will monitor you carefully for complications. HOME CARE INSTRUCTIONS  No special instructions are needed after a transfusion. You may find your energy is better. Speak with your caregiver about any limitations on activity for underlying diseases you may have. SEEK MEDICAL CARE IF:   Your condition is not improving after your transfusion.  You develop redness or irritation at the intravenous (IV) site. SEEK IMMEDIATE MEDICAL CARE IF:  Any of the following  symptoms occur over the next 12 hours:  Shaking chills.  You have a temperature by mouth above 102 F (38.9 C), not controlled by medicine.  Chest, back, or muscle pain.  People around you feel you are not acting correctly or are confused.  Shortness of breath or difficulty breathing.  Dizziness and fainting.  You get a rash or develop hives.  You have a decrease in urine output.  Your urine turns a dark color or changes to pink, red, or brown. Any of the following symptoms occur over the next 10 days:  You have a temperature by mouth above 102 F (38.9 C), not controlled by medicine.  Shortness of breath.  Weakness after normal activity.  The white part of the eye turns yellow (jaundice).  You have a decrease in the amount of urine or are urinating less often.  Your urine turns a dark color or changes to pink, red, or brown. Document Released: 01/29/2000 Document Revised: 04/25/2011 Document Reviewed: 09/17/2007 [facility] Patient Information 2014 [place], [place].  _______________________________________________________________________

## 2016-11-04 NOTE — Progress Notes (Addendum)
09-19-16 (EPIC) Cardiac Clearance from Dr. [name] in office note, and on chart. EKG  09-12-16 (EPIC) ECHO  Above discussed with Dr. Marcene Duos. Per Dr. Sampson Goon, pt okay for surgery.

## 2016-11-07 ENCOUNTER — Encounter (HOSPITAL_COMMUNITY)
Admission: RE | Admit: 2016-11-07 | Discharge: 2016-11-07 | Disposition: A | Discharge status: Home / Self Care | Payer: Medicare Other | Source: Ambulatory Visit | Attending: Orthopedic Surgery | Admitting: Orthopedic Surgery

## 2016-11-07 ENCOUNTER — Encounter (HOSPITAL_COMMUNITY): Payer: Self-pay

## 2016-11-07 DIAGNOSIS — M1712 Unilateral primary osteoarthritis, left knee: Secondary | ICD-10-CM | POA: Diagnosis not present

## 2016-11-07 DIAGNOSIS — Z01812 Encounter for preprocedural laboratory examination: Secondary | ICD-10-CM | POA: Insufficient documentation

## 2016-11-07 LAB — SURGICAL PCR SCREEN
MRSA, PCR: NEGATIVE
STAPHYLOCOCCUS AUREUS: NEGATIVE

## 2016-11-07 LAB — CBC
HEMATOCRIT: 37.6 % (ref 36.0–46.0)
HEMOGLOBIN: 12.3 g/dL (ref 12.0–15.0)
MCH: 29.9 pg (ref 26.0–34.0)
MCHC: 32.7 g/dL (ref 30.0–36.0)
MCV: 91.5 fL (ref 78.0–100.0)
Platelets: 247 10*3/uL (ref 150–400)
RBC: 4.11 MIL/uL (ref 3.87–5.11)
RDW: 13.2 % (ref 11.5–15.5)
WBC: 5.5 10*3/uL (ref 4.0–10.5)

## 2016-11-07 LAB — COMPREHENSIVE METABOLIC PANEL
ALBUMIN: 3.7 g/dL (ref 3.5–5.0)
ALK PHOS: 46 U/L (ref 38–126)
ALT: 9 U/L — AB (ref 14–54)
AST: 16 U/L (ref 15–41)
Anion gap: 8 (ref 5–15)
BILIRUBIN TOTAL: 0.8 mg/dL (ref 0.3–1.2)
BUN: 25 mg/dL — ABNORMAL HIGH (ref 6–20)
CO2: 25 mmol/L (ref 22–32)
Calcium: 9.1 mg/dL (ref 8.9–10.3)
Chloride: 104 mmol/L (ref 101–111)
Creatinine, Ser: 1.01 mg/dL — ABNORMAL HIGH (ref 0.44–1.00)
GFR calc Af Amer: >60 mL/min (ref >60)
GFR calc non Af Amer: 54 mL/min — ABNORMAL LOW (ref >60)
GLUCOSE: 86 mg/dL (ref 65–99)
Potassium: 4.6 mmol/L (ref 3.5–5.1)
SODIUM: 137 mmol/L (ref 135–145)
TOTAL PROTEIN: 6.7 g/dL (ref 6.5–8.1)

## 2016-11-07 LAB — APTT: aPTT: 31 seconds (ref 24–36)

## 2016-11-07 LAB — PROTIME-INR
INR: 1.02
Prothrombin Time: 13.3 seconds (ref 11.4–15.2)

## 2016-11-07 NOTE — Progress Notes (Signed)
11-07-16 Progress note routed to Dr. Lequita Halt for review.

## 2016-11-13 ENCOUNTER — Ambulatory Visit: Payer: Self-pay | Admitting: Orthopedic Surgery

## 2016-11-13 NOTE — H&P (Signed)
Nicole Graves DOB: Jun 28, 1945 Divorced / Language: [place] / Race: White Female Date of Admission:  11/14/2016 CC:  Left knee pain History of Present Illness The patient is a 71 year old female who comes in for a preoperative History and Physical. The patient is scheduled for a left total knee arthroplasty to be performed by Dr. Gus Rankin. Aluisio, MD at [facility] on 11/14/2016. The patient is a 71 year old female who presented for follow up of their knee. The patient is being followed for their left knee pain and osteoarthritis. They are now month(s) out from last cortisone injection. Symptoms reported include: pain, pain after sitting, swelling, popping, grinding, giving way, instability and difficulty ambulating. The patient feels that they are doing poorly and report their pain level to be moderate to severe. The following medication has been used for pain control: Tramadol and Voltaren. The patient has reported symptom improvement with: Cortisone injections while they have not gotten any relief of their symptoms with: bracing or viscosupplementation. She said that right knee is doing fantastic at this time. She is not having any pain in the right knee. She will be a year out in October. Left knee is becoming more problematic. It is hurting her at all times. She has had progressive valgus deformity. She is having pain in day and night. She has had cortisone in the past, which just provided temporary relief. She has reached a point where she would like to get the knee replaced. They have been treated conservatively in the past for the above stated problem and despite conservative measures, they continue to have progressive pain and severe functional limitations and dysfunction. They have failed non-operative management including home exercise, medications, and injections. It is felt that they would benefit from undergoing total joint replacement. Risks and benefits of the procedure have been  discussed with the patient and they elect to proceed with surgery. There are no active contraindications to surgery such as ongoing infection or rapidly progressive neurological disease.   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  Family History  Heart disease in female family member before age 61  Hypertension  Mother. Osteoarthritis  Brother, Mother, Sister. Heart disease in female family member before age 79  Osteoporosis  Sister. First Degree Relatives  reported Cancer  Brother, Mother. Depression  Mother, Sister. Chronic Obstructive Lung Disease  Mother. Congestive Heart Failure  Mother. Diabetes Mellitus  Sister.  Social History Marital status  divorced Current work status  retired Tobacco use  Never smoker. 03/28/2013 Current drinker  03/28/2013: Currently drinks only occasionally per week Children  2 Living situation  live alone Number of flights of stairs before winded  1 No history of drug/alcohol rehab  Under pain contract  Exercise  Exercises never; does other Tobacco / smoke exposure  03/28/2013: no Post-Surgical Plans  Home with HHPT. Home with Daughter Advance Directives  Living Will  Medication History  Calcium Active. CeleBREX (  Capsule, Oral)  Active. Omeprazole (  Capsule ER, Oral) Active. Vitamin D  (2000UNIT Tablet, Oral) Active. Claritin (  Tablet, Oral) Active. (generic) BuPROPion HCl ER (XL) (  Tablet ER 24HR, Oral) Active. Cymbalta (  Capsule DR Part, Oral) Active. [name] (  Tablet, Oral) Active. Atorvastatin Calcium (  Tablet, Oral) Active. Cyclobenzaprine HCl (  Tablet, Oral) Active. Melatonin (  Tablet, Oral) Active. Metoprolol Succinate ER (  Tablet ER 24HR, Oral) Active. TraMADol HCl (  Tablet, Oral) Active. Voltaren Gel Active. Aspirin (  Tablet, Oral) Active. Fosamax Active.   Past Surgical History Tubal Ligation  Total Knee Replacement - Right [11/30/2015]: Gallbladder Surgery  Date: 1968. open Cataract Surgery  Date: 2012. bilateral Arthroscopy of Knee  right Colon Polyp Removal - Colonoscopy  Cesarean Delivery  2 times; 1967 and 1982   Review of Systems General Present- Fatigue. Not Present- Chills, Fever, Memory Loss, Night Sweats, Weight Gain and Weight Loss. Skin Not Present- Eczema, Hives, Itching, Lesions and Rash. HEENT Not Present- Dentures, Double Vision, Headache, Hearing Loss, Tinnitus and Visual Loss. Respiratory Not Present- Allergies, Chronic Cough, Coughing up blood, Shortness of breath at rest and Shortness of breath with exertion. Cardiovascular Present- Chest Pain, Irregular Heart Beat, Murmur, Palpitations, Racing/skipping heartbeats and Rapid Heart Rate. Not Present- Difficulty Breathing Lying Down and Swelling. Gastrointestinal Present- Constipation and Hemorrhoids. Not Present- Abdominal Pain, Bloody Stool, Diarrhea, Difficulty Swallowing, Heartburn, Jaundice, Loss of appetitie, Nausea and Vomiting. Female Genitourinary Not Present- Blood in Urine, Discharge, Flank Pain, Incontinence, Painful Urination, Urgency, Urinary frequency, Urinary Retention, Urinating at Night and Weak urinary stream. Musculoskeletal Present- Back Pain, Joint Pain, Joint Stiffness, Leg Cramps, Morning Stiffness and  Muscle Cramps. Not Present- Joint Swelling, Muscle Pain, Muscle Weakness and Spasms. Neurological Present- Difficulty with balance and Vertigo. Not Present- Blackout spells, Dizziness, Paralysis, Tremor and Weakness. Psychiatric Present- Anxiety and Depression. Not Present- Insomnia.  Vitals  Weight: 187 lb Height: 65in Weight was reported by patient. Height was reported by patient. Body Surface Area: 1.92 m Body Mass Index: 31.12 kg/m  Pulse: 68 (Regular)  BP: 124/78 (Sitting, Right Arm, Standard)    Physical Exam  General Mental Status -Alert, cooperative and good historian. General Appearance-pleasant, Not in acute distress. Orientation-Oriented X3. Build & Nutrition-Well nourished and Well developed.  Head and Neck Head-normocephalic, atraumatic . Neck Global Assessment - supple, no bruit auscultated on the right, no bruit auscultated on the left.  Eye Pupil - Bilateral-Regular and Round. Motion - Bilateral-EOMI.  Chest and Lung Exam Auscultation Breath sounds - clear at anterior chest wall and clear at posterior chest wall. Adventitious sounds - No Adventitious sounds.  Cardiovascular Auscultation Rhythm - Regular rate and rhythm. Heart Sounds - S1 WNL and S2 WNL. Murmurs & Other Heart Sounds: Murmur 1 - Location - Aortic Area, Carotids and Sternal Border - Left(upper edge). Timing - Holosystolic. Grade - III/VI. Character - Holosystolic.  Abdomen Palpation/Percussion Tenderness - Abdomen is non-tender to palpation. Rigidity (guarding) - Abdomen is soft. Auscultation Auscultation of the abdomen reveals - Bowel sounds normal.  Female Genitourinary Note: Not done, not pertinent to present illness   Musculoskeletal Note: She is in no distress. Her left knee shows valgus deformity. There is no effusion. Range is 5 to 130. She is tender lateral greater than medial with no instability. Right knee, no effusion. Range of motion right knee 0  to 130 with no tenderness or instability.  RADIOGRAPHS AP and lateral of both knee show the prosthesis on the right is in excellent position with no periprosthetic abnormalities. On the left, she  has severe bone on bone arthritis, lateral and patellofemoral compartments with about a 10 degree valgus deformity.  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 [name] Cards: Dr. [name] - Patient has been seen preoperatively and felt to be stable for surgery.  IV TXA  Anesthesia Issues: None  Patient was instructed on what medications to stop prior to surgery.  Signed electronically by Lauraine Rinne, III PA-C

## 2016-11-14 ENCOUNTER — Inpatient Hospital Stay (HOSPITAL_COMMUNITY): Payer: Medicare Other | Admitting: Anesthesiology

## 2016-11-14 ENCOUNTER — Encounter (HOSPITAL_COMMUNITY)
Admission: RE | Disposition: A | Discharge status: Home / Self Care | Payer: Self-pay | Source: Ambulatory Visit | Attending: Orthopedic Surgery

## 2016-11-14 ENCOUNTER — Inpatient Hospital Stay (HOSPITAL_COMMUNITY)
Admission: RE | Admit: 2016-11-14 | Discharge: 2016-11-16 | DRG: 470 | Disposition: A | Discharge status: Home / Self Care | Payer: Medicare Other | Source: Ambulatory Visit | Attending: Orthopedic Surgery | Admitting: Orthopedic Surgery

## 2016-11-14 ENCOUNTER — Encounter (HOSPITAL_COMMUNITY): Payer: Self-pay | Admitting: *Deleted

## 2016-11-14 DIAGNOSIS — I071 Rheumatic tricuspid insufficiency: Secondary | ICD-10-CM | POA: Diagnosis not present

## 2016-11-14 DIAGNOSIS — M1710 Unilateral primary osteoarthritis, unspecified knee: Secondary | ICD-10-CM | POA: Diagnosis present

## 2016-11-14 DIAGNOSIS — G8929 Other chronic pain: Secondary | ICD-10-CM | POA: Diagnosis present

## 2016-11-14 DIAGNOSIS — K219 Gastro-esophageal reflux disease without esophagitis: Secondary | ICD-10-CM | POA: Diagnosis present

## 2016-11-14 DIAGNOSIS — F329 Major depressive disorder, single episode, unspecified: Secondary | ICD-10-CM | POA: Diagnosis not present

## 2016-11-14 DIAGNOSIS — M1711 Unilateral primary osteoarthritis, right knee: Principal | ICD-10-CM | POA: Diagnosis present

## 2016-11-14 DIAGNOSIS — M1712 Unilateral primary osteoarthritis, left knee: Secondary | ICD-10-CM

## 2016-11-14 DIAGNOSIS — M545 Low back pain: Secondary | ICD-10-CM | POA: Diagnosis present

## 2016-11-14 DIAGNOSIS — M179 Osteoarthritis of knee, unspecified: Secondary | ICD-10-CM | POA: Diagnosis present

## 2016-11-14 HISTORY — PX: TOTAL KNEE ARTHROPLASTY: SHX125

## 2016-11-14 LAB — TYPE AND SCREEN
ABO/RH(D): O POS
ANTIBODY SCREEN: NEGATIVE

## 2016-11-14 SURGERY — ARTHROPLASTY, KNEE, TOTAL
Anesthesia: General | Site: Knee | Laterality: Left

## 2016-11-14 MED ORDER — METHOCARBAMOL 1000 MG/10ML IJ SOLN
500.0000 mg | Freq: Four times a day (QID) | INTRAVENOUS | Status: DC | PRN
  Administered 2016-11-14: 500 mg via INTRAVENOUS
  Filled 2016-11-14: qty 550

## 2016-11-14 MED ORDER — 0.9 % SODIUM CHLORIDE (POUR BTL) OPTIME
TOPICAL | Status: DC | PRN
  Administered 2016-11-14: 1000 mL

## 2016-11-14 MED ORDER — BISACODYL 10 MG RE SUPP: 10.0000 mg | Freq: Every day | RECTAL | Status: DC | PRN

## 2016-11-14 MED ORDER — GABAPENTIN 300 MG PO CAPS
300.0000 mg | ORAL_CAPSULE | Freq: Once | ORAL | Status: AC
  Administered 2016-11-14: 300 mg via ORAL

## 2016-11-14 MED ORDER — ONDANSETRON HCL 4 MG/2ML IJ SOLN
INTRAMUSCULAR | Status: DC | PRN
  Administered 2016-11-14: 4 mg via INTRAVENOUS

## 2016-11-14 MED ORDER — PHENOL 1.4 % MT LIQD: 1.0000 | OROMUCOSAL | Status: DC | PRN

## 2016-11-14 MED ORDER — SODIUM CHLORIDE 0.9 % IJ SOLN
INTRAMUSCULAR | Status: AC
  Filled 2016-11-14: qty 50

## 2016-11-14 MED ORDER — OXYCODONE HCL 5 MG PO TABS
5.0000 mg | ORAL_TABLET | ORAL | Status: DC | PRN
  Administered 2016-11-14: 5 mg via ORAL
  Administered 2016-11-14 (×2): 10 mg via ORAL
  Administered 2016-11-14: 5 mg via ORAL
  Administered 2016-11-15 – 2016-11-16 (×8): 10 mg via ORAL
  Filled 2016-11-14 (×11): qty 2

## 2016-11-14 MED ORDER — SODIUM CHLORIDE 0.9 % IJ SOLN
INTRAMUSCULAR | Status: AC
  Filled 2016-11-14: qty 10

## 2016-11-14 MED ORDER — SODIUM CHLORIDE 0.9 % IV SOLN
1000.0000 mg | INTRAVENOUS | Status: AC
  Administered 2016-11-14: 1000 mg via INTRAVENOUS
  Filled 2016-11-14: qty 1100

## 2016-11-14 MED ORDER — PROPOFOL 10 MG/ML IV BOLUS
INTRAVENOUS | Status: AC
  Filled 2016-11-14: qty 20

## 2016-11-14 MED ORDER — BUPIVACAINE LIPOSOME 1.3 % IJ SUSP
20.0000 mL | Freq: Once | INTRAMUSCULAR | Status: DC
  Filled 2016-11-14: qty 20

## 2016-11-14 MED ORDER — OXYCODONE HCL 5 MG/5ML PO SOLN
5.0000 mg | Freq: Once | ORAL | Status: DC | PRN
  Filled 2016-11-14: qty 5

## 2016-11-14 MED ORDER — ACETAMINOPHEN 10 MG/ML IV SOLN
1000.0000 mg | Freq: Once | INTRAVENOUS | Status: AC
  Administered 2016-11-14: 1000 mg via INTRAVENOUS

## 2016-11-14 MED ORDER — ACETAMINOPHEN 650 MG RE SUPP
650.0000 mg | Freq: Four times a day (QID) | RECTAL | Status: DC | PRN
Start: 2016-11-15 — End: 2016-11-16

## 2016-11-14 MED ORDER — DEXAMETHASONE SODIUM PHOSPHATE 10 MG/ML IJ SOLN: 10.0000 mg | Freq: Once | INTRAMUSCULAR | Status: DC

## 2016-11-14 MED ORDER — ONDANSETRON HCL 4 MG/2ML IJ SOLN: 4.0000 mg | Freq: Four times a day (QID) | INTRAMUSCULAR | Status: DC | PRN

## 2016-11-14 MED ORDER — DOCUSATE SODIUM 100 MG PO CAPS
100.0000 mg | ORAL_CAPSULE | Freq: Two times a day (BID) | ORAL | Status: DC
  Administered 2016-11-14 – 2016-11-16 (×4): 100 mg via ORAL
  Filled 2016-11-14 (×4): qty 1

## 2016-11-14 MED ORDER — LURASIDONE HCL 60 MG PO TABS
60.0000 mg | ORAL_TABLET | Freq: Every day | ORAL | Status: DC
  Administered 2016-11-14 – 2016-11-15 (×2): 60 mg via ORAL
  Filled 2016-11-14 (×2): qty 1

## 2016-11-14 MED ORDER — FLEET ENEMA 7-19 GM/118ML RE ENEM: 1.0000 | ENEMA | Freq: Once | RECTAL | Status: DC | PRN

## 2016-11-14 MED ORDER — LACTATED RINGERS IV SOLN
INTRAVENOUS | Status: DC
  Administered 2016-11-14 (×2): via INTRAVENOUS

## 2016-11-14 MED ORDER — CEFAZOLIN SODIUM-DEXTROSE 2-4 GM/100ML-% IV SOLN
INTRAVENOUS | Status: AC
  Filled 2016-11-14: qty 100

## 2016-11-14 MED ORDER — DEXAMETHASONE SODIUM PHOSPHATE 10 MG/ML IJ SOLN
INTRAMUSCULAR | Status: DC | PRN
  Administered 2016-11-14: 10 mg via INTRAVENOUS

## 2016-11-14 MED ORDER — ONDANSETRON HCL 4 MG/2ML IJ SOLN
INTRAMUSCULAR | Status: AC
  Filled 2016-11-14: qty 2

## 2016-11-14 MED ORDER — ONDANSETRON HCL 4 MG PO TABS: 4.0000 mg | ORAL_TABLET | Freq: Four times a day (QID) | ORAL | Status: DC | PRN

## 2016-11-14 MED ORDER — DEXAMETHASONE SODIUM PHOSPHATE 10 MG/ML IJ SOLN
10.0000 mg | Freq: Once | INTRAMUSCULAR | Status: AC
  Administered 2016-11-15: 10 mg via INTRAVENOUS
  Filled 2016-11-14: qty 1

## 2016-11-14 MED ORDER — SODIUM CHLORIDE 0.9 % IV SOLN
INTRAVENOUS | Status: DC
  Administered 2016-11-14: 75 mL/h via INTRAVENOUS
  Administered 2016-11-14: 23:00:00 via INTRAVENOUS

## 2016-11-14 MED ORDER — PANTOPRAZOLE SODIUM 40 MG PO TBEC
40.0000 mg | DELAYED_RELEASE_TABLET | Freq: Every day | ORAL | Status: DC
  Administered 2016-11-15: 40 mg via ORAL
  Filled 2016-11-14: qty 1

## 2016-11-14 MED ORDER — FENTANYL CITRATE (PF) 100 MCG/2ML IJ SOLN
INTRAMUSCULAR | Status: DC | PRN
  Administered 2016-11-14: 50 ug via INTRAVENOUS
  Administered 2016-11-14 (×2): 100 ug via INTRAVENOUS

## 2016-11-14 MED ORDER — ACETAMINOPHEN 500 MG PO TABS
1000.0000 mg | ORAL_TABLET | Freq: Four times a day (QID) | ORAL | Status: AC
  Administered 2016-11-14 – 2016-11-15 (×3): 1000 mg via ORAL
  Filled 2016-11-14 (×4): qty 2

## 2016-11-14 MED ORDER — POLYETHYLENE GLYCOL 3350 17 G PO PACK
17.0000 g | PACK | Freq: Every day | ORAL | Status: DC | PRN
  Administered 2016-11-14: 17 g via ORAL
  Filled 2016-11-14: qty 1

## 2016-11-14 MED ORDER — SUGAMMADEX SODIUM 200 MG/2ML IV SOLN
INTRAVENOUS | Status: DC | PRN
  Administered 2016-11-14: 200 mg via INTRAVENOUS

## 2016-11-14 MED ORDER — BUPIVACAINE LIPOSOME 1.3 % IJ SUSP
INTRAMUSCULAR | Status: DC | PRN
  Administered 2016-11-14: 20 mL

## 2016-11-14 MED ORDER — LIDOCAINE 2% (20 MG/ML) 5 ML SYRINGE
INTRAMUSCULAR | Status: DC | PRN
  Administered 2016-11-14: 100 mg via INTRAVENOUS

## 2016-11-14 MED ORDER — TRANEXAMIC ACID 1000 MG/10ML IV SOLN
1000.0000 mg | Freq: Once | INTRAVENOUS | Status: AC
  Administered 2016-11-14: 1000 mg via INTRAVENOUS
  Filled 2016-11-14: qty 10

## 2016-11-14 MED ORDER — MIDAZOLAM HCL 2 MG/2ML IJ SOLN
1.0000 mg | INTRAMUSCULAR | Status: DC | PRN
  Administered 2016-11-14: 1 mg via INTRAVENOUS

## 2016-11-14 MED ORDER — OXYCODONE HCL 5 MG PO TABS: 5.0000 mg | ORAL_TABLET | Freq: Once | ORAL | Status: DC | PRN

## 2016-11-14 MED ORDER — DULOXETINE HCL 60 MG PO CPEP
90.0000 mg | ORAL_CAPSULE | Freq: Every day | ORAL | Status: DC
  Administered 2016-11-15 – 2016-11-16 (×2): 90 mg via ORAL
  Filled 2016-11-14 (×2): qty 1

## 2016-11-14 MED ORDER — ACETAMINOPHEN 10 MG/ML IV SOLN
INTRAVENOUS | Status: AC
  Filled 2016-11-14: qty 100

## 2016-11-14 MED ORDER — CEFAZOLIN SODIUM-DEXTROSE 2-4 GM/100ML-% IV SOLN
2.0000 g | Freq: Four times a day (QID) | INTRAVENOUS | Status: AC
  Administered 2016-11-14 (×2): 2 g via INTRAVENOUS
  Filled 2016-11-14 (×2): qty 100

## 2016-11-14 MED ORDER — CHLORHEXIDINE GLUCONATE 4 % EX LIQD: 60.0000 mL | Freq: Once | CUTANEOUS | Status: DC

## 2016-11-14 MED ORDER — FENTANYL CITRATE (PF) 100 MCG/2ML IJ SOLN
50.0000 ug | INTRAMUSCULAR | Status: DC | PRN
  Administered 2016-11-14: 50 ug via INTRAVENOUS

## 2016-11-14 MED ORDER — CYCLOBENZAPRINE HCL 10 MG PO TABS
10.0000 mg | ORAL_TABLET | Freq: Every day | ORAL | Status: DC
  Administered 2016-11-14 – 2016-11-15 (×2): 10 mg via ORAL
  Filled 2016-11-14 (×2): qty 1

## 2016-11-14 MED ORDER — DEXAMETHASONE SODIUM PHOSPHATE 10 MG/ML IJ SOLN
INTRAMUSCULAR | Status: AC
  Filled 2016-11-14: qty 1

## 2016-11-14 MED ORDER — RIVAROXABAN 10 MG PO TABS
10.0000 mg | ORAL_TABLET | Freq: Every day | ORAL | Status: DC
  Administered 2016-11-15 – 2016-11-16 (×2): 10 mg via ORAL
  Filled 2016-11-14 (×2): qty 1

## 2016-11-14 MED ORDER — LORATADINE 10 MG PO TABS
10.0000 mg | ORAL_TABLET | Freq: Every day | ORAL | Status: DC
  Administered 2016-11-15 – 2016-11-16 (×2): 10 mg via ORAL
  Filled 2016-11-14 (×2): qty 1

## 2016-11-14 MED ORDER — MIDAZOLAM HCL 2 MG/2ML IJ SOLN
INTRAMUSCULAR | Status: AC
  Administered 2016-11-14: 1 mg via INTRAVENOUS
  Filled 2016-11-14: qty 2

## 2016-11-14 MED ORDER — MORPHINE SULFATE (PF) 4 MG/ML IV SOLN: 1.0000 mg | INTRAVENOUS | Status: DC | PRN

## 2016-11-14 MED ORDER — FENTANYL CITRATE (PF) 250 MCG/5ML IJ SOLN
INTRAMUSCULAR | Status: AC
  Filled 2016-11-14: qty 5

## 2016-11-14 MED ORDER — ROCURONIUM BROMIDE 50 MG/5ML IV SOSY
PREFILLED_SYRINGE | INTRAVENOUS | Status: AC
  Filled 2016-11-14: qty 5

## 2016-11-14 MED ORDER — ATORVASTATIN CALCIUM 10 MG PO TABS
10.0000 mg | ORAL_TABLET | Freq: Every day | ORAL | Status: DC
  Administered 2016-11-14 – 2016-11-15 (×2): 10 mg via ORAL
  Filled 2016-11-14 (×2): qty 1

## 2016-11-14 MED ORDER — METOPROLOL SUCCINATE ER 25 MG PO TB24
25.0000 mg | ORAL_TABLET | Freq: Every evening | ORAL | Status: DC | PRN
  Administered 2016-11-15: 25 mg via ORAL
  Filled 2016-11-14: qty 1

## 2016-11-14 MED ORDER — FENTANYL CITRATE (PF) 100 MCG/2ML IJ SOLN: 25.0000 ug | INTRAMUSCULAR | Status: DC | PRN

## 2016-11-14 MED ORDER — METOCLOPRAMIDE HCL 5 MG/ML IJ SOLN: 5.0000 mg | Freq: Three times a day (TID) | INTRAMUSCULAR | Status: DC | PRN

## 2016-11-14 MED ORDER — FENTANYL CITRATE (PF) 100 MCG/2ML IJ SOLN
INTRAMUSCULAR | Status: AC
  Administered 2016-11-14: 50 ug via INTRAVENOUS
  Filled 2016-11-14: qty 2

## 2016-11-14 MED ORDER — METOCLOPRAMIDE HCL 5 MG PO TABS: 5.0000 mg | ORAL_TABLET | Freq: Three times a day (TID) | ORAL | Status: DC | PRN

## 2016-11-14 MED ORDER — SUGAMMADEX SODIUM 200 MG/2ML IV SOLN
INTRAVENOUS | Status: AC
  Filled 2016-11-14: qty 2

## 2016-11-14 MED ORDER — ROCURONIUM BROMIDE 10 MG/ML (PF) SYRINGE
PREFILLED_SYRINGE | INTRAVENOUS | Status: DC | PRN
  Administered 2016-11-14: 30 mg via INTRAVENOUS

## 2016-11-14 MED ORDER — PROPOFOL 10 MG/ML IV BOLUS
INTRAVENOUS | Status: DC | PRN
  Administered 2016-11-14: 150 mg via INTRAVENOUS

## 2016-11-14 MED ORDER — BUPROPION HCL ER (XL) 300 MG PO TB24
300.0000 mg | ORAL_TABLET | Freq: Every day | ORAL | Status: DC
  Administered 2016-11-15 – 2016-11-16 (×2): 300 mg via ORAL
  Filled 2016-11-14 (×2): qty 1

## 2016-11-14 MED ORDER — METHOCARBAMOL 500 MG PO TABS
500.0000 mg | ORAL_TABLET | Freq: Four times a day (QID) | ORAL | Status: DC | PRN
  Administered 2016-11-14 – 2016-11-16 (×2): 500 mg via ORAL
  Filled 2016-11-14 (×3): qty 1

## 2016-11-14 MED ORDER — ACETAMINOPHEN 325 MG PO TABS
650.0000 mg | ORAL_TABLET | Freq: Four times a day (QID) | ORAL | Status: DC | PRN
  Administered 2016-11-15 – 2016-11-16 (×3): 650 mg via ORAL
  Filled 2016-11-14 (×3): qty 2

## 2016-11-14 MED ORDER — SUCCINYLCHOLINE CHLORIDE 200 MG/10ML IV SOSY
PREFILLED_SYRINGE | INTRAVENOUS | Status: DC | PRN
  Administered 2016-11-14: 100 mg via INTRAVENOUS

## 2016-11-14 MED ORDER — LIDOCAINE 2% (20 MG/ML) 5 ML SYRINGE
INTRAMUSCULAR | Status: AC
  Filled 2016-11-14: qty 5

## 2016-11-14 MED ORDER — DIPHENHYDRAMINE HCL 12.5 MG/5ML PO ELIX
12.5000 mg | ORAL_SOLUTION | ORAL | Status: DC | PRN
  Administered 2016-11-14: 25 mg via ORAL
  Filled 2016-11-14: qty 10

## 2016-11-14 MED ORDER — MENTHOL 3 MG MT LOZG: 1.0000 | LOZENGE | OROMUCOSAL | Status: DC | PRN

## 2016-11-14 MED ORDER — PHENYLEPHRINE HCL 10 MG/ML IJ SOLN
INTRAMUSCULAR | Status: AC
  Filled 2016-11-14: qty 1

## 2016-11-14 MED ORDER — GABAPENTIN 300 MG PO CAPS
ORAL_CAPSULE | ORAL | Status: AC
  Administered 2016-11-14: 300 mg via ORAL
  Filled 2016-11-14: qty 1

## 2016-11-14 MED ORDER — PHENYLEPHRINE HCL 10 MG/ML IJ SOLN
INTRAVENOUS | Status: DC | PRN
  Administered 2016-11-14: 50 ug/min via INTRAVENOUS

## 2016-11-14 MED ORDER — SODIUM CHLORIDE 0.9 % IJ SOLN
INTRAMUSCULAR | Status: DC | PRN
  Administered 2016-11-14: 60 mL

## 2016-11-14 MED ORDER — ONDANSETRON HCL 4 MG/2ML IJ SOLN: 4.0000 mg | Freq: Once | INTRAMUSCULAR | Status: DC | PRN

## 2016-11-14 MED ORDER — STERILE WATER FOR IRRIGATION IR SOLN
Status: DC | PRN
  Administered 2016-11-14: 2000 mL

## 2016-11-14 MED ORDER — SUCCINYLCHOLINE CHLORIDE 200 MG/10ML IV SOSY
PREFILLED_SYRINGE | INTRAVENOUS | Status: AC
  Filled 2016-11-14: qty 10

## 2016-11-14 MED ORDER — CEFAZOLIN SODIUM-DEXTROSE 2-4 GM/100ML-% IV SOLN
2.0000 g | INTRAVENOUS | Status: AC
  Administered 2016-11-14: 2 g via INTRAVENOUS

## 2016-11-14 SURGICAL SUPPLY — 53 items
BAG DECANTER FOR FLEXI CONT (MISCELLANEOUS) ×2 IMPLANT
BAG SPEC THK2 15X12 ZIP CLS (MISCELLANEOUS) ×1
BAG ZIPLOCK 12X15 (MISCELLANEOUS) ×2 IMPLANT
BANDAGE ACE 6X5 VEL STRL LF (GAUZE/BANDAGES/DRESSINGS) ×2 IMPLANT
BLADE SAG 18X100X1.27 (BLADE) ×2 IMPLANT
BLADE SAW SGTL 11.0X1.19X90.0M (BLADE) ×2 IMPLANT
BNDG CMPR 82X61 PLY HI ABS (GAUZE/BANDAGES/DRESSINGS) ×1
BNDG CONFORM 6X.82 1P STRL (GAUZE/BANDAGES/DRESSINGS) ×1 IMPLANT
BOWL SMART MIX CTS (DISPOSABLE) ×2 IMPLANT
CAP KNEE TOTAL 3 SIGMA ×1 IMPLANT
CEMENT HV SMART SET (Cement) ×4 IMPLANT
COVER SURGICAL LIGHT HANDLE (MISCELLANEOUS) ×2 IMPLANT
CUFF TOURN SGL QUICK 34 (TOURNIQUET CUFF) ×2
CUFF TRNQT CYL 34X4X40X1 (TOURNIQUET CUFF) ×1 IMPLANT
DECANTER SPIKE VIAL GLASS SM (MISCELLANEOUS) ×2 IMPLANT
DRAPE U-SHAPE 47X51 STRL (DRAPES) ×2 IMPLANT
DRSG ADAPTIC 3X8 NADH LF (GAUZE/BANDAGES/DRESSINGS) ×2 IMPLANT
DRSG PAD ABDOMINAL 8X10 ST (GAUZE/BANDAGES/DRESSINGS) ×2 IMPLANT
DURAPREP 26ML APPLICATOR (WOUND CARE) ×2 IMPLANT
ELECT REM PT RETURN 15FT ADLT (MISCELLANEOUS) ×2 IMPLANT
EVACUATOR 1/8 PVC DRAIN (DRAIN) ×2 IMPLANT
GAUZE SPONGE 4X4 12PLY STRL (GAUZE/BANDAGES/DRESSINGS) ×2 IMPLANT
GLOVE BIO SURGEON STRL SZ7.5 (GLOVE) IMPLANT
GLOVE BIO SURGEON STRL SZ8 (GLOVE) ×2 IMPLANT
GLOVE BIOGEL PI IND STRL 6.5 (GLOVE) IMPLANT
GLOVE BIOGEL PI IND STRL 8 (GLOVE) ×1 IMPLANT
GLOVE BIOGEL PI INDICATOR 6.5 (GLOVE)
GLOVE BIOGEL PI INDICATOR 8 (GLOVE) ×1
GLOVE SURG SS PI 6.5 STRL IVOR (GLOVE) IMPLANT
GOWN STRL REUS W/TWL LRG LVL3 (GOWN DISPOSABLE) ×2 IMPLANT
GOWN STRL REUS W/TWL XL LVL3 (GOWN DISPOSABLE) IMPLANT
HANDPIECE INTERPULSE COAX TIP (DISPOSABLE) ×2
IMMOBILIZER KNEE 20 (SOFTGOODS) ×3 IMPLANT
IMMOBILIZER KNEE 20 THIGH 36 (SOFTGOODS) ×1 IMPLANT
MANIFOLD NEPTUNE II (INSTRUMENTS) ×2 IMPLANT
NS IRRIG 1000ML POUR BTL (IV SOLUTION) ×2 IMPLANT
PACK TOTAL KNEE CUSTOM (KITS) ×2 IMPLANT
PAD ABD 8X10 STRL (GAUZE/BANDAGES/DRESSINGS) ×1 IMPLANT
PADDING CAST COTTON 6X4 STRL (CAST SUPPLIES) ×6 IMPLANT
POSITIONER SURGICAL ARM (MISCELLANEOUS) ×2 IMPLANT
SET HNDPC FAN SPRY TIP SCT (DISPOSABLE) ×1 IMPLANT
STRIP CLOSURE SKIN 1/2X4 (GAUZE/BANDAGES/DRESSINGS) ×4 IMPLANT
SUT MNCRL AB 4-0 PS2 18 (SUTURE) ×2 IMPLANT
SUT STRATAFIX 0 PDS 27 VIOLET (SUTURE) ×2
SUT VIC AB 2-0 CT1 27 (SUTURE) ×6
SUT VIC AB 2-0 CT1 TAPERPNT 27 (SUTURE) ×3 IMPLANT
SUTURE STRATFX 0 PDS 27 VIOLET (SUTURE) ×1 IMPLANT
SYR 30ML LL (SYRINGE) ×4 IMPLANT
TAPE STRIPS DRAPE STRL (GAUZE/BANDAGES/DRESSINGS) ×1 IMPLANT
TRAY FOLEY W/METER SILVER 16FR (SET/KITS/TRAYS/PACK) ×2 IMPLANT
WATER STERILE IRR 1000ML POUR (IV SOLUTION) ×4 IMPLANT
WRAP KNEE MAXI GEL POST OP (GAUZE/BANDAGES/DRESSINGS) ×2 IMPLANT
YANKAUER SUCT BULB TIP 10FT TU (MISCELLANEOUS) ×2 IMPLANT

## 2016-11-14 NOTE — Discharge Instructions (Addendum)
° °Dr. Frank Aluisio °Total Joint Specialist °[facility] °[address] °[place], [place] [address] °[phone] ° °TOTAL KNEE REPLACEMENT POSTOPERATIVE DIRECTIONS ° °Knee Rehabilitation, Guidelines Following Surgery  °Results after knee surgery are often greatly improved when you follow the exercise, range of motion and muscle strengthening exercises prescribed by your doctor. Safety measures are also important to protect the knee from further injury. Any time any of these exercises cause you to have increased pain or swelling in your knee joint, decrease the amount until you are comfortable again and slowly increase them. If you have problems or questions, call your caregiver or physical therapist for advice.  ° °HOME CARE INSTRUCTIONS  °Remove items at home which could result in a fall. This includes throw rugs or furniture in walking pathways.  °· ICE to the affected knee every three hours for 30 minutes at a time and then as needed for pain and swelling.  Continue to use ice on the knee for pain and swelling from surgery. You may notice swelling that will progress down to the foot and ankle.  This is normal after surgery.  Elevate the leg when you are not up walking on it.   °· Continue to use the breathing machine which will help keep your temperature down.  It is common for your temperature to cycle up and down following surgery, especially at night when you are not up moving around and exerting yourself.  The breathing machine keeps your lungs expanded and your temperature down. °· Do not place pillow under knee, focus on keeping the knee straight while resting ° °DIET °You may resume your previous home diet once your are discharged from the hospital. ° °DRESSING / WOUND CARE / SHOWERING °You may shower 3 days after surgery, but keep the wounds dry during showering.  You may use an occlusive plastic wrap (Press'n Seal for example), NO SOAKING/SUBMERGING IN THE BATHTUB.  If the  bandage gets wet, change with a clean dry gauze.  If the incision gets wet, pat the wound dry with a clean towel. °You may start showering once you are discharged home but do not submerge the incision under water. Just pat the incision dry and apply a dry gauze dressing on daily. °Change the surgical dressing daily and reapply a dry dressing each time. ° °ACTIVITY °Walk with your walker as instructed. °Use walker as long as suggested by your caregivers. °Avoid periods of inactivity such as sitting longer than an hour when not asleep. This helps prevent blood clots.  °You may resume a sexual relationship in one month or when given the OK by your doctor.  °You may return to work once you are cleared by your doctor.  °Do not drive a car for 6 weeks or until released by you surgeon.  °Do not drive while taking narcotics. ° °WEIGHT BEARING °Weight bearing as tolerated with assist device (walker, cane, etc) as directed, use it as long as suggested by your surgeon or therapist, typically at least 4-6 weeks. ° °POSTOPERATIVE CONSTIPATION PROTOCOL °Constipation - defined medically as fewer than three stools per week and severe constipation as less than one stool per week. ° °One of the most common issues patients have following surgery is constipation.  Even if you have a regular bowel pattern at home, your normal regimen is likely to be disrupted due to multiple reasons following surgery.  Combination of anesthesia, postoperative narcotics, change in appetite and fluid intake all can affect your bowels.    In order to avoid complications following surgery, here are some recommendations in order to help you during your recovery period. ° °Colace (docusate) - Pick up an over-the-counter form of Colace or another stool softener and take twice a day as long as you are requiring postoperative pain medications.  Take with a full glass of water daily.  If you experience loose stools or diarrhea, hold the colace until you stool forms  back up.  If your symptoms do not get better within 1 week or if they get worse, check with your doctor. ° °Dulcolax (bisacodyl) - Pick up over-the-counter and take as directed by the product packaging as needed to assist with the movement of your bowels.  Take with a full glass of water.  Use this product as needed if not relieved by Colace only.  ° °MiraLax (polyethylene glycol) - Pick up over-the-counter to have on hand.  MiraLax is a solution that will increase the amount of water in your bowels to assist with bowel movements.  Take as directed and can mix with a glass of water, juice, soda, coffee, or tea.  Take if you go more than two days without a movement. °Do not use MiraLax more than once per day. Call your doctor if you are still constipated or irregular after using this medication for 7 days in a row. ° °If you continue to have problems with postoperative constipation, please contact the office for further assistance and recommendations.  If you experience "the worst abdominal pain ever" or develop nausea or vomiting, please contact the office immediatly for further recommendations for treatment. ° °ITCHING ° If you experience itching with your medications, try taking only a single pain pill, or even half a pain pill at a time.  You can also use Benadryl over the counter for itching or also to help with sleep.  ° °TED HOSE STOCKINGS °Wear the elastic stockings on both legs for three weeks following surgery during the day but you may remove then at night for sleeping. ° °MEDICATIONS °See your medication summary on the “After Visit Summary” that the nursing staff will review with you prior to discharge.  You may have some home medications which will be placed on hold until you complete the course of blood thinner medication.  It is important for you to complete the blood thinner medication as prescribed by your surgeon.  Continue your approved medications as instructed at time of  discharge. ° °PRECAUTIONS °If you experience chest pain or shortness of breath - call 911 immediately for transfer to the hospital emergency department.  °If you develop a fever greater that 101 F, purulent drainage from wound, increased redness or drainage from wound, foul odor from the wound/dressing, or calf pain - CONTACT YOUR SURGEON.   °                                                °FOLLOW-UP APPOINTMENTS °Make sure you keep all of your appointments after your operation with your surgeon and caregivers. You should call the office at the above phone number and make an appointment for approximately two weeks after the date of your surgery or on the date instructed by your surgeon outlined in the "After Visit Summary". ° ° °RANGE OF MOTION AND STRENGTHENING EXERCISES  °Rehabilitation of the knee is important following a knee injury or   an operation. After just a few days of immobilization, the muscles of the thigh which control the knee become weakened and shrink (atrophy). Knee exercises are designed to build up the tone and strength of the thigh muscles and to improve knee motion. Often times heat used for twenty to thirty minutes before working out will loosen up your tissues and help with improving the range of motion but do not use heat for the first two weeks following surgery. These exercises can be done on a training (exercise) mat, on the floor, on a table or on a bed. Use what ever works the best and is most comfortable for you Knee exercises include:  °Leg Lifts - While your knee is still immobilized in a splint or cast, you can do straight leg raises. Lift the leg to 60 degrees, hold for 3 sec, and slowly lower the leg. Repeat 10-20 times 2-3 times daily. Perform this exercise against resistance later as your knee gets better.  °Quad and Hamstring Sets - Tighten up the muscle on the front of the thigh (Quad) and hold for 5-10 sec. Repeat this 10-20 times hourly. Hamstring sets are done by pushing the  foot backward against an object and holding for 5-10 sec. Repeat as with quad sets.  °· Leg Slides: Lying on your back, slowly slide your foot toward your buttocks, bending your knee up off the floor (only go as far as is comfortable). Then slowly slide your foot back down until your leg is flat on the floor again. °· Angel Wings: Lying on your back spread your legs to the side as far apart as you can without causing discomfort.  °A rehabilitation program following serious knee injuries can speed recovery and prevent re-injury in the future due to weakened muscles. Contact your doctor or a physical therapist for more information on knee rehabilitation.  ° °IF YOU ARE TRANSFERRED TO A SKILLED REHAB FACILITY °If the patient is transferred to a skilled rehab facility following release from the hospital, a list of the current medications will be sent to the facility for the patient to continue.  When discharged from the skilled rehab facility, please have the facility set up the patient's Home Health Physical Therapy prior to being released. Also, the skilled facility will be responsible for providing the patient with their medications at time of release from the facility to include their pain medication, the muscle relaxants, and their blood thinner medication. If the patient is still at the rehab facility at time of the two week follow up appointment, the skilled rehab facility will also need to assist the patient in arranging follow up appointment in our office and any transportation needs. ° °MAKE SURE YOU:  °Understand these instructions.  °Get help right away if you are not doing well or get worse.  ° ° °Pick up stool softner and laxative for home use following surgery while on pain medications. °Do not submerge incision under water. °Please use good hand washing techniques while changing dressing each day. °May shower starting three days after surgery. °Please use a clean towel to pat the incision dry following  showers. °Continue to use ice for pain and swelling after surgery. °Do not use any lotions or creams on the incision until instructed by your surgeon. ° °Take Xarelto for two and a half more weeks following discharge from the hospital, then discontinue Xarelto. °Once the patient has completed the Xarelto, they may resume the 81 mg Aspirin. ° ° ° °Information on   my medicine - XARELTO® (Rivaroxaban) ° °This medication education was reviewed with me or my healthcare representative as part of my discharge preparation.  The pharmacist that spoke with me during my hospital stay was:  Legge, Justin Marshall, RPH ° °Why was Xarelto® prescribed for you? °Xarelto® was prescribed for you to reduce the risk of blood clots forming after orthopedic surgery. The medical term for these abnormal blood clots is venous thromboembolism (VTE). ° °What do you need to know about xarelto® ? °Take your Xarelto® ONCE DAILY at the same time every day. °You may take it either with or without food. ° °If you have difficulty swallowing the tablet whole, you may crush it and mix in applesauce just prior to taking your dose. ° °Take Xarelto® exactly as prescribed by your doctor and DO NOT stop taking Xarelto® without talking to the doctor who prescribed the medication.  Stopping without other VTE prevention medication to take the place of Xarelto® may increase your risk of developing a clot. ° °After discharge, you should have regular check-up appointments with your healthcare provider that is prescribing your Xarelto®.   ° °What do you do if you miss a dose? °If you miss a dose, take it as soon as you remember on the same day then continue your regularly scheduled once daily regimen the next day. Do not take two doses of Xarelto® on the same day.  ° °Important Safety Information °A possible side effect of Xarelto® is bleeding. You should call your healthcare provider right away if you experience any of the following: °? Bleeding from an injury or  your nose that does not stop. °? Unusual colored urine (red or dark brown) or unusual colored stools (red or black). °? Unusual bruising for unknown reasons. °? A serious fall or if you hit your head (even if there is no bleeding). ° °Some medicines may interact with Xarelto® and might increase your risk of bleeding while on Xarelto®. To help avoid this, consult your healthcare provider or pharmacist prior to using any new prescription or non-prescription medications, including herbals, vitamins, non-steroidal anti-inflammatory drugs (NSAIDs) and supplements. ° °This website has more information on Xarelto®: www.xarelto.com. ° ° ° °

## 2016-11-14 NOTE — Progress Notes (Addendum)
Assisted Dr. Noreene Larsson with left knee, ultrasound guided, adductor canal block. Side rails up, monitors on throughout procedure. See vital signs in flow sheet. Tolerated Procedure well.

## 2016-11-14 NOTE — Anesthesia Postprocedure Evaluation (Signed)
Anesthesia Post Note  Patient: Nicole Graves  Procedure(s) Performed: LEFT TOTAL KNEE ARTHROPLASTY (Left Knee)     Patient location during evaluation: PACU Anesthesia Type: General Level of consciousness: awake, awake and alert and oriented Pain management: pain level controlled Vital Signs Assessment: post-procedure vital signs reviewed and stable Respiratory status: spontaneous breathing, nonlabored ventilation and respiratory function stable Cardiovascular status: blood pressure returned to baseline Anesthetic complications: no    Last Vitals:  Vitals:   11/14/16 1452 11/14/16 1719  BP: 102/65 112/67  Pulse: 90 88  Resp: 18 16  Temp: 36.8 C 36.8 C  SpO2: 95% 96%    Last Pain:  Vitals:   11/14/16 1807  TempSrc:   PainSc: 4                  Sabrinna Yearwood COKER

## 2016-11-14 NOTE — Anesthesia Preprocedure Evaluation (Signed)
Anesthesia Evaluation  Patient identified by MRN, date of birth, ID band Patient awake    Reviewed: Allergy & Precautions, NPO status , Patient's Chart, lab work & pertinent test results  Airway Mallampati: II  TM Distance: >3 FB Neck ROM: Full    Dental  (+) Partial Lower, Partial Upper, Dental Advisory Given   Pulmonary    breath sounds clear to auscultation       Cardiovascular  Rhythm:Regular Rate:Normal     Neuro/Psych    GI/Hepatic   Endo/Other    Renal/GU      Musculoskeletal   Abdominal   Peds  Hematology   Anesthesia Other Findings   Reproductive/Obstetrics                             Anesthesia Physical Anesthesia Plan  ASA: III  Anesthesia Plan: General   Post-op Pain Management:  Regional for Post-op pain   Induction: Intravenous  PONV Risk Score and Plan: 1 and Ondansetron and Dexamethasone  Airway Management Planned: Oral ETT  Additional Equipment:   Intra-op Plan:   Post-operative Plan: Extubation in OR  Informed Consent: I have reviewed the patients History and Physical, chart, labs and discussed the procedure including the risks, benefits and alternatives for the proposed anesthesia with the patient or authorized representative who has indicated his/her understanding and acceptance.   Dental advisory given  Plan Discussed with: CRNA and Anesthesiologist  Anesthesia Plan Comments:         Anesthesia Quick Evaluation

## 2016-11-14 NOTE — H&P (View-Only) (Signed)
Nicole Graves DOB: 06/26/1945 Divorced / Language: English / Race: White Female Date of Admission:  11/14/2016 CC:  Left knee pain History of Present Illness The patient is a 71 year old female who comes in for a preoperative History and Physical. The patient is scheduled for a left total knee arthroplasty to be performed by Dr. Frank V. Aluisio, MD at  Hospital on 11/14/2016. The patient is a 71 year old female who presented for follow up of their knee. The patient is being followed for their left knee pain and osteoarthritis. They are now month(s) out from last cortisone injection. Symptoms reported include: pain, pain after sitting, swelling, popping, grinding, giving way, instability and difficulty ambulating. The patient feels that they are doing poorly and report their pain level to be moderate to severe. The following medication has been used for pain control: Tramadol and Voltaren. The patient has reported symptom improvement with: Cortisone injections while they have not gotten any relief of their symptoms with: bracing or viscosupplementation. She said that right knee is doing fantastic at this time. She is not having any pain in the right knee. She will be a year out in October. Left knee is becoming more problematic. It is hurting her at all times. She has had progressive valgus deformity. She is having pain in day and night. She has had cortisone in the past, which just provided temporary relief. She has reached a point where she would like to get the knee replaced. They have been treated conservatively in the past for the above stated problem and despite conservative measures, they continue to have progressive pain and severe functional limitations and dysfunction. They have failed non-operative management including home exercise, medications, and injections. It is felt that they would benefit from undergoing total joint replacement. Risks and benefits of the procedure have been  discussed with the patient and they elect to proceed with surgery. There are no active contraindications to surgery such as ongoing infection or rapidly progressive neurological disease.   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  Family History  Heart disease in female family member before age 65  Hypertension  Mother. Osteoarthritis  Brother, Mother, Sister. Heart disease in female family member before age 55  Osteoporosis  Sister. First Degree Relatives  reported Cancer  Brother, Mother. Depression  Mother, Sister. Chronic Obstructive Lung Disease  Mother. Congestive Heart Failure  Mother. Diabetes Mellitus  Sister.  Social History Marital status  divorced Current work status  retired Tobacco use  Never smoker. 03/28/2013 Current drinker  03/28/2013: Currently drinks only occasionally per week Children  2 Living situation  live alone Number of flights of stairs before winded  1 No history of drug/alcohol rehab  Under pain contract  Exercise  Exercises never; does other Tobacco / smoke exposure  03/28/2013: no Post-Surgical Plans  Home with HHPT. Home with Daughter Advance Directives  Living Will  Medication History  Calcium Active. CeleBREX (200MG Capsule, Oral)   Active. Omeprazole (20MG Capsule ER, Oral) Active. Vitamin D  (2000UNIT Tablet, Oral) Active. Claritin (10MG Tablet, Oral) Active. (generic) BuPROPion HCl ER (XL) (300MG Tablet ER 24HR, Oral) Active. Cymbalta (30MG Capsule DR Part, Oral) Active. [name] (40MG Tablet, Oral) Active. Atorvastatin Calcium (10MG Tablet, Oral) Active. Cyclobenzaprine HCl (10MG Tablet, Oral) Active. Melatonin (1MG Tablet, Oral) Active. Metoprolol Succinate ER (25MG Tablet ER 24HR, Oral) Active. TraMADol HCl (50MG Tablet, Oral) Active. Voltaren Gel Active. Aspirin (81MG Tablet, Oral) Active. Fosamax Active.   Past Surgical History Tubal Ligation  Total Knee Replacement - Right [11/30/2015]: Gallbladder Surgery  Date: 1968. open Cataract Surgery  Date: 2012. bilateral Arthroscopy of Knee  right Colon Polyp Removal - Colonoscopy  Cesarean Delivery  2 times; 1967 and 1982   Review of Systems General Present- Fatigue. Not Present- Chills, Fever, Memory Loss, Night Sweats, Weight Gain and Weight Loss. Skin Not Present- Eczema, Hives, Itching, Lesions and Rash. HEENT Not Present- Dentures, Double Vision, Headache, Hearing Loss, Tinnitus and Visual Loss. Respiratory Not Present- Allergies, Chronic Cough, Coughing up blood, Shortness of breath at rest and Shortness of breath with exertion. Cardiovascular Present- Chest Pain, Irregular Heart Beat, Murmur, Palpitations, Racing/skipping heartbeats and Rapid Heart Rate. Not Present- Difficulty Breathing Lying Down and Swelling. Gastrointestinal Present- Constipation and Hemorrhoids. Not Present- Abdominal Pain, Bloody Stool, Diarrhea, Difficulty Swallowing, Heartburn, Jaundice, Loss of appetitie, Nausea and Vomiting. Female Genitourinary Not Present- Blood in Urine, Discharge, Flank Pain, Incontinence, Painful Urination, Urgency, Urinary frequency, Urinary Retention, Urinating at Night and Weak urinary stream. Musculoskeletal Present- Back Pain, Joint Pain, Joint Stiffness, Leg Cramps, Morning Stiffness and  Muscle Cramps. Not Present- Joint Swelling, Muscle Pain, Muscle Weakness and Spasms. Neurological Present- Difficulty with balance and Vertigo. Not Present- Blackout spells, Dizziness, Paralysis, Tremor and Weakness. Psychiatric Present- Anxiety and Depression. Not Present- Insomnia.  Vitals  Weight: 187 lb Height: 65in Weight was reported by patient. Height was reported by patient. Body Surface Area: 1.92 m Body Mass Index: 31.12 kg/m  Pulse: 68 (Regular)  BP: 124/78 (Sitting, Right Arm, Standard)    Physical Exam  General Mental Status -Alert, cooperative and good historian. General Appearance-pleasant, Not in acute distress. Orientation-Oriented X3. Build & Nutrition-Well nourished and Well developed.  Head and Neck Head-normocephalic, atraumatic . Neck Global Assessment - supple, no bruit auscultated on the right, no bruit auscultated on the left.  Eye Pupil - Bilateral-Regular and Round. Motion - Bilateral-EOMI.  Chest and Lung Exam Auscultation Breath sounds - clear at anterior chest wall and clear at posterior chest wall. Adventitious sounds - No Adventitious sounds.  Cardiovascular Auscultation Rhythm - Regular rate and rhythm. Heart Sounds - S1 WNL and S2 WNL. Murmurs & Other Heart Sounds: Murmur 1 - Location - Aortic Area, Carotids and Sternal Border - Left(upper edge). Timing - Holosystolic. Grade - III/VI. Character - Holosystolic.  Abdomen Palpation/Percussion Tenderness - Abdomen is non-tender to palpation. Rigidity (guarding) - Abdomen is soft. Auscultation Auscultation of the abdomen reveals - Bowel sounds normal.  Female Genitourinary Note: Not done, not pertinent to present illness   Musculoskeletal Note: She is in no distress. Her left knee shows valgus deformity. There is no effusion. Range is 5 to 130. She is tender lateral greater than medial with no instability. Right knee, no effusion. Range of motion right knee 0  to 130 with no tenderness or instability.  RADIOGRAPHS AP and lateral of both knee show the prosthesis on the right is in excellent position with no periprosthetic abnormalities. On the left, she   has severe bone on bone arthritis, lateral and patellofemoral compartments with about a 10 degree valgus deformity.  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 Lisa Miller Cards: Dr. Turner - Patient has been seen preoperatively and felt to be stable for surgery.  IV TXA  Anesthesia Issues: None  Patient was instructed on what medications to stop prior to surgery.  Signed electronically by Rony Ratz L Willy Vorce, III PA-C 

## 2016-11-14 NOTE — Transfer of Care (Signed)
Immediate Anesthesia Transfer of Care Note  Patient: Nicole Graves  Procedure(s) Performed: LEFT TOTAL KNEE ARTHROPLASTY (Left Knee)  Patient Location: PACU  Anesthesia Type:General and Regional  Level of Consciousness: sedated  Airway & Oxygen Therapy: Patient Spontanous Breathing and Patient connected to face mask oxygen  Post-op Assessment: Report given to RN and Post -op Vital signs reviewed and stable  Post vital signs: Reviewed and stable  Last Vitals:  Vitals:   11/14/16 0813 11/14/16 0814  BP: 125/78   Pulse: 73 72  Resp: 17 20  Temp:    SpO2: 100% 100%    Last Pain:  Vitals:   11/14/16 0615  TempSrc: Oral      Patients Stated Pain Goal: 3 (11/14/16 1610)  Complications: No apparent anesthesia complications

## 2016-11-14 NOTE — Op Note (Addendum)
OPERATIVE REPORT-TOTAL KNEE ARTHROPLASTY   Pre-operative diagnosis- Osteoarthritis  Left knee(s)  Post-operative diagnosis- Osteoarthritis Left knee(s)  Procedure-  Left  Total Knee Arthroplasty  Surgeon- Nicole Rankin. Deliana Avalos, MD  Assistant- Avel Peace, PA-C   Anesthesia-  General and adductor canal block  EBL-* No blood loss amount entered *   Drains Hemovac  Tourniquet time-  Total Tourniquet Time Documented: Thigh (Left) - 30 minutes Total: Thigh (Left) - 30 minutes     Complications- None  Condition-PACU - hemodynamically stable.   Brief Clinical Note  Nicole Graves is a 71 y.o. year old female with end stage OA of her left knee with progressively worsening pain and dysfunction. She has constant pain, with activity and at rest and significant functional deficits with difficulties even with ADLs. She has had extensive non-op management including analgesics, injections of cortisone and viscosupplements, and home exercise program, but remains in significant pain with significant dysfunction. Radiographs show bone on bone arthritis lateral and patellofemoral with valgus deformity.. She presents now for left Total Knee Arthroplasty.    Procedure in detail---   The patient is brought into the operating room and positioned supine on the operating table. After successful administration of  Adductor canal block and general   a tourniquet is placed high on the  Left thigh(s) and the lower extremity is prepped and draped in the usual sterile fashion. Time out is performed by the operating team and then the  Left lower extremity is wrapped in Esmarch, knee flexed and the tourniquet inflated to 300 mmHg.       A midline incision is made with a ten blade through the subcutaneous tissue to the level of the extensor mechanism. A fresh blade is used to make a medial parapatellar arthrotomy. Soft tissue over the proximal medial tibia is subperiosteally elevated to the joint line with a knife and  into the semimembranosus bursa with a Cobb elevator. Soft tissue over the proximal lateral tibia is elevated with attention being paid to avoiding the patellar tendon on the tibial tubercle. The patella is everted, knee flexed 90 degrees and the ACL and PCL are removed. Findings are bone on bone lateral and patellofemoral with large global osteophytes.        The drill is used to create a starting hole in the distal femur and the canal is thoroughly irrigated with sterile saline to remove the fatty contents. The 5 degree Left  valgus alignment guide is placed into the femoral canal and the distal femoral cutting block is pinned to remove 10 mm off the distal femur. Resection is made with an oscillating saw.      The tibia is subluxed forward and the menisci are removed. The extramedullary alignment guide is placed referencing proximally at the medial aspect of the tibial tubercle and distally along the second metatarsal axis and tibial crest. The block is pinned to remove 2mm off the more deficient lateral  side. Resection is made with an oscillating saw. Size 3is the most appropriate size for the tibia and the proximal tibia is prepared with the modular drill and keel punch for that size.      The femoral sizing guide is placed and size 3 is most appropriate. Rotation is marked off the epicondylar axis and confirmed by creating a rectangular flexion gap at 90 degrees. The size 3 cutting block is pinned in this rotation and the anterior, posterior and chamfer cuts are made with the oscillating saw. The intercondylar block is  then placed and that cut is made.      Trial size 3 tibial component, trial size 3 posterior stabilized femur and a 12.5  mm posterior stabilized rotating platform insert trial is placed. Full extension is achieved with excellent varus/valgus and anterior/posterior balance throughout full range of motion. The patella is everted and thickness measured to be 22  mm. Free hand resection is  taken to 12 mm, a 38 template is placed, lug holes are drilled, trial patella is placed, and it tracks normally. Osteophytes are removed off the posterior femur with the trial in place. All trials are removed and the cut bone surfaces prepared with pulsatile lavage. Cement is mixed and once ready for implantation, the size 3 tibial implant, size  3 posterior stabilized femoral component, and the size 38 patella are cemented in place and the patella is held with the clamp. The trial insert is placed and the knee held in full extension. The Exparel (20 ml mixed with 60 ml saline) is injected into the extensor mechanism, posterior capsule, medial and lateral gutters and subcutaneous tissues.  All extruded cement is removed and once the cement is hard the permanent 12.5 mm posterior stabilized rotating platform insert is placed into the tibial tray.      The wound is copiously irrigated with saline solution and the extensor mechanism closed over a hemovac drain with #1 V-loc suture. The tourniquet is released for a total tourniquet time of 30  minutes. Flexion against gravity is 140 degrees and the patella tracks normally. Subcutaneous tissue is closed with 2.0 vicryl and subcuticular with running 4.0 Monocryl. The incision is cleaned and dried and steri-strips and a bulky sterile dressing are applied. The limb is placed into a knee immobilizer and the patient is awakened and transported to recovery in stable condition.      Please note that a surgical assistant was a medical necessity for this procedure in order to perform it in a safe and expeditious manner. Surgical assistant was necessary to retract the ligaments and vital neurovascular structures to prevent injury to them and also necessary for proper positioning of the limb to allow for anatomic placement of the prosthesis.   Nicole Rankin Goro Wenrick, MD    11/14/2016, 9:16 AM

## 2016-11-14 NOTE — Anesthesia Procedure Notes (Signed)
Procedure Name: Intubation Date/Time: 11/14/2016 8:25 AM Performed by: Lind Covert Pre-anesthesia Checklist: Patient identified, Emergency Drugs available, Suction available, Patient being monitored and Timeout performed Patient Re-evaluated:Patient Re-evaluated prior to induction Oxygen Delivery Method: Circle system utilized Preoxygenation: Pre-oxygenation with 100% oxygen Induction Type: IV induction Ventilation: Mask ventilation without difficulty Laryngoscope Size: Mac and 3 Grade View: Grade II Tube type: Oral Tube size: 7.0 mm Number of attempts: 3 (first 2 times bounced off the base of the cords) Airway Equipment and Method: Stylet Placement Confirmation: ETT inserted through vocal cords under direct vision,  positive ETCO2 and breath sounds checked- equal and bilateral Secured at: 21 cm Tube secured with: Tape Dental Injury: Teeth and Oropharynx as per pre-operative assessment

## 2016-11-14 NOTE — Interval H&P Note (Signed)
History and Physical Interval Note:  11/14/2016 6:38 AM  Nicole Graves  has presented today for surgery, with the diagnosis of Osteoarthritis Left Knee  The various methods of treatment have been discussed with the patient and family. After consideration of risks, benefits and other options for treatment, the patient has consented to  Procedure(s): LEFT TOTAL KNEE ARTHROPLASTY (Left) as a surgical intervention .  The patient's history has been reviewed, patient examined, no change in status, stable for surgery.  I have reviewed the patient's chart and labs.  Questions were answered to the patient's satisfaction.     Loanne Drilling

## 2016-11-14 NOTE — Evaluation (Signed)
Physical Therapy Evaluation Patient Details Name: Nicole Graves MRN: [id number] DOB: March 23, 1945 Today's Date: 11/14/2016   History of Present Illness  L TKA, RTKA 1 year ago  Clinical Impression  The patient ambulated x 50' Plans DC home w/ HHPT . Pt admitted with above diagnosis. Pt currently with functional limitations due to the deficits listed below (see PT Problem List).  Pt will benefit from skilled PT to increase their independence and safety with mobility to allow discharge to the venue listed below.        Follow Up Recommendations Home health PT    Equipment Recommendations  None recommended by PT    Recommendations for Other Services       Precautions / Restrictions Precautions Precautions: Fall Required Braces or Orthoses: Knee Immobilizer - Left      Mobility  Bed Mobility Overal bed mobility: Needs Assistance Bed Mobility: Supine to Sit     Supine to sit: Min assist     General bed mobility comments: cues for technique  Transfers Overall transfer level: Needs assistance Equipment used: Rolling walker (2 wheeled) Transfers: Sit to/from Stand Sit to Stand: Min assist         General transfer comment: cues for sequence and posture, hand placement  Ambulation/Gait Ambulation/Gait assistance: Min assist Ambulation Distance (Feet): 50 Feet Assistive device: Rolling walker (2 wheeled) Gait Pattern/deviations: Step-to pattern;Antalgic     General Gait Details: cues for sequence and posture  Stairs            Wheelchair Mobility    Modified Rankin (Stroke Patients Only)       Balance                                             Pertinent Vitals/Pain Pain Assessment: 0-10 Pain Score: 5  Pain Location: left knee Pain Descriptors / Indicators: Aching;Burning;Sore Pain Intervention(s): Limited activity within patient's tolerance;Monitored during session;Premedicated before session;Repositioned;Ice applied    Home  Living Family/patient expects to be discharged to:: Private residence     Type of Home: House Home Access: Stairs to enter Entrance Stairs-Rails: Right Entrance Stairs-Number of Steps: 3 Home Layout: Able to live on main level with bedroom/bathroom Home Equipment: Dan Humphreys - 2 wheels      Prior Function Level of Independence: Independent               Hand Dominance        Extremity/Trunk Assessment   Upper Extremity Assessment Upper Extremity Assessment: Overall WFL for tasks assessed    Lower Extremity Assessment Lower Extremity Assessment: LLE deficits/detail LLE Deficits / Details: able to raise leg       Communication   Communication: No difficulties  Cognition Arousal/Alertness: Awake/alert Behavior During Therapy: WFL for tasks assessed/performed Overall Cognitive Status: Within Functional Limits for tasks assessed                                        General Comments      Exercises     Assessment/Plan    PT Assessment Patient needs continued PT services  PT Problem List Decreased strength;Decreased range of motion;Decreased knowledge of use of DME;Decreased activity tolerance;Decreased safety awareness;Decreased knowledge of precautions;Decreased mobility;Pain       PT Treatment Interventions DME instruction;Gait training;Stair  training;Functional mobility training;Therapeutic activities;Therapeutic exercise;Patient/family education    PT Goals (Current goals can be found in the Care Plan section)  Acute Rehab PT Goals Patient Stated Goal: to go home PT Goal Formulation: With patient Time For Goal Achievement: 11/19/16 Potential to Achieve Goals: Good    Frequency 7X/week   Barriers to discharge        Co-evaluation               AM-PAC PT "6 Clicks" Daily Activity  Outcome Measure Difficulty turning over in bed (including adjusting bedclothes, sheets and blankets)?: A Little Difficulty moving from lying on  back to sitting on the side of the bed? : A Little Difficulty sitting down on and standing up from a chair with arms (e.g., wheelchair, bedside commode, etc,.)?: Unable Help needed moving to and from a bed to chair (including a wheelchair)?: Total Help needed walking in hospital room?: Total Help needed climbing 3-5 steps with a railing? : Total 6 Click Score: 10    End of Session Equipment Utilized During Treatment: Gait belt Activity Tolerance: Patient tolerated treatment well Patient left: in chair;with call bell/phone within reach Nurse Communication: Mobility status PT Visit Diagnosis: Difficulty in walking, not elsewhere classified (R26.2);Pain Pain - Right/Left: Left Pain - part of body: Knee    Time: 1610-9604 PT Time Calculation (min) (ACUTE ONLY): 15 min   Charges:   PT Evaluation $PT Eval Low Complexity: 1 Low     PT G CodesBlanchard Kelch PT [phone]   Rada Hay 11/14/2016, 6:38 PM

## 2016-11-14 NOTE — Anesthesia Procedure Notes (Addendum)
Anesthesia Regional Block: Adductor canal block   Pre-Anesthetic Checklist: ,, timeout performed, Correct Patient, Correct Site, Correct Laterality, Correct Procedure, Correct Position, site marked, Risks and benefits discussed,  Surgical consent,  Pre-op evaluation,  At surgeon's request and post-op pain management  Laterality: Left  Prep: chloraprep       Needles:  Injection technique: Single-shot  Needle Type: Echogenic Stimulator Needle     Needle Length: 9cm  Needle Gauge: 21     Additional Needles:   Procedures:,,,, ultrasound used (permanent image in chart),,,,  Narrative:  Start time: 11/14/2016 8:05 AM End time: 11/14/2016 8:10 AM Injection made incrementally with aspirations every 5 mL.  Performed by: Personally  Anesthesiologist: Karrington Mccravy  Additional Notes: 20 cc 0.75% Naropin

## 2016-11-15 LAB — CBC
HEMATOCRIT: 33.4 % — AB (ref 36.0–46.0)
Hemoglobin: 10.6 g/dL — ABNORMAL LOW (ref 12.0–15.0)
MCH: 29 pg (ref 26.0–34.0)
MCHC: 31.7 g/dL (ref 30.0–36.0)
MCV: 91.5 fL (ref 78.0–100.0)
PLATELETS: 233 10*3/uL (ref 150–400)
RBC: 3.65 MIL/uL — AB (ref 3.87–5.11)
RDW: 13.1 % (ref 11.5–15.5)
WBC: 9.6 10*3/uL (ref 4.0–10.5)

## 2016-11-15 LAB — BASIC METABOLIC PANEL
ANION GAP: 5 (ref 5–15)
BUN: 21 mg/dL — AB (ref 6–20)
CO2: 27 mmol/L (ref 22–32)
Calcium: 8.3 mg/dL — ABNORMAL LOW (ref 8.9–10.3)
Chloride: 107 mmol/L (ref 101–111)
Creatinine, Ser: 0.88 mg/dL (ref 0.44–1.00)
GFR calc Af Amer: >60 mL/min (ref >60)
GFR calc non Af Amer: >60 mL/min (ref >60)
GLUCOSE: 106 mg/dL — AB (ref 65–99)
POTASSIUM: 4.4 mmol/L (ref 3.5–5.1)
Sodium: 139 mmol/L (ref 135–145)

## 2016-11-15 MED ORDER — OXYCODONE HCL 5 MG PO TABS: 5.0000 mg | ORAL_TABLET | ORAL | 0 refills | Status: DC | PRN

## 2016-11-15 MED ORDER — TRAMADOL HCL 50 MG PO TABS: 50.0000 mg | ORAL_TABLET | Freq: Four times a day (QID) | ORAL | 0 refills | Status: DC | PRN

## 2016-11-15 MED ORDER — RIVAROXABAN 10 MG PO TABS
10.0000 mg | ORAL_TABLET | Freq: Every day | ORAL | 0 refills | Status: DC
Start: 2016-11-16 — End: 2016-11-16

## 2016-11-15 MED ORDER — METHOCARBAMOL 500 MG PO TABS: 500.0000 mg | ORAL_TABLET | Freq: Four times a day (QID) | ORAL | 0 refills | Status: DC | PRN

## 2016-11-15 MED ORDER — OMEPRAZOLE 20 MG PO CPDR
20.0000 mg | DELAYED_RELEASE_CAPSULE | Freq: Every day | ORAL | Status: DC
  Administered 2016-11-16: 20 mg via ORAL
  Filled 2016-11-15: qty 1

## 2016-11-15 NOTE — Discharge Summary (Signed)
Physician Discharge Summary   Patient ID: Nicole Graves MRN: [id number] DOB/AGE: Apr 29, 1945 71 y.o.  Admit date: 11/14/2016 Discharge date: 11-16-2016  Primary Diagnosis:  Osteoarthritis  Left knee(s)  Admission Diagnoses:  Past Medical History:  Diagnosis Date  . Anemia   . Anginal pain ([place]) 01/2015  . Aortic stenosis    Echo 01/2016: mild LVH, vigorous LVF, no RWMA, Gr 1 DD, mild to moderate AS (mean 16 mmHg), no AI, mild LAE, PASP 42 mmHg  . Arthritis    Knees, neck, back  . Chronic lower back pain    with siatica  . Colon polyp 1999  . Depression   . Dysrhythmia   . GERD (gastroesophageal reflux disease)   . PONV (postoperative nausea and vomiting)   . SVT (supraventricular tachycardia) ([place])   . TR (tricuspid regurgitation) 09/19/2016   Discharge Diagnoses:   Active Problems:   OA (osteoarthritis) of knee  Estimated body mass index is 31.28 kg/m as calculated from the following:   Height as of this encounter: '5\' 5"'  (1.651 m).   Weight as of this encounter: 85.3 kg (188 lb).  Procedure:  Procedure(s) (LRB): LEFT TOTAL KNEE ARTHROPLASTY (Left)   Consults: None  HPI: Nicole Graves is a 71 y.o. year old female with end stage OA of her left knee with progressively worsening pain and dysfunction. She has constant pain, with activity and at rest and significant functional deficits with difficulties even with ADLs. She has had extensive non-op management including analgesics, injections of cortisone and viscosupplements, and home exercise program, but remains in significant pain with significant dysfunction. Radiographs show bone on bone arthritis lateral and patellofemoral with valgus deformity.. She presents now for left Total Knee Arthroplasty.    Laboratory Data: Admission on 11/14/2016  Component Date Value Ref Range Status  . WBC 11/15/2016 9.6  4.0 - 10.5 K/uL Final  . RBC 11/15/2016 3.65* 3.87 - 5.11 MIL/uL Final  . Hemoglobin 11/15/2016 10.6* 12.0 - 15.0 g/dL Final   . HCT 11/15/2016 33.4* 36.0 - 46.0 % Final  . MCV 11/15/2016 91.5  78.0 - 100.0 fL Final  . MCH 11/15/2016 29.0  26.0 - 34.0 pg Final  . MCHC 11/15/2016 31.7  30.0 - 36.0 g/dL Final  . RDW 11/15/2016 13.1  11.5 - 15.5 % Final  . Platelets 11/15/2016 233  150 - 400 K/uL Final  . Sodium 11/15/2016 139  135 - 145 mmol/L Final  . Potassium 11/15/2016 4.4  3.5 - 5.1 mmol/L Final  . Chloride 11/15/2016 107  101 - 111 mmol/L Final  . CO2 11/15/2016 27  22 - 32 mmol/L Final  . Glucose, Bld 11/15/2016 106* 65 - 99 mg/dL Final  . BUN 11/15/2016 21* 6 - 20 mg/dL Final  . Creatinine, Ser 11/15/2016 0.88  0.44 - 1.00 mg/dL Final  . Calcium 11/15/2016 8.3* 8.9 - 10.3 mg/dL Final  . GFR calc non Af Amer 11/15/2016 >60  >60 mL/min Final  . GFR calc Af Amer 11/15/2016 >60  >60 mL/min Final   Comment: (NOTE) The eGFR has been calculated using the CKD EPI equation. This calculation has not been validated in all clinical situations. eGFR's persistently <60 mL/min signify possible Chronic Kidney Disease.   . Anion gap 11/15/2016 5  5 - 15 Final  . WBC 11/16/2016 9.2  4.0 - 10.5 K/uL Final  . RBC 11/16/2016 3.54* 3.87 - 5.11 MIL/uL Final  . Hemoglobin 11/16/2016 10.5* 12.0 - 15.0 g/dL Final  . HCT 11/16/2016  32.0* 36.0 - 46.0 % Final  . MCV 11/16/2016 90.4  78.0 - 100.0 fL Final  . MCH 11/16/2016 29.7  26.0 - 34.0 pg Final  . MCHC 11/16/2016 32.8  30.0 - 36.0 g/dL Final  . RDW 11/16/2016 13.3  11.5 - 15.5 % Final  . Platelets 11/16/2016 249  150 - 400 K/uL Final  . Sodium 11/16/2016 137  135 - 145 mmol/L Final  . Potassium 11/16/2016 3.9  3.5 - 5.1 mmol/L Final  . Chloride 11/16/2016 103  101 - 111 mmol/L Final  . CO2 11/16/2016 26  22 - 32 mmol/L Final  . Glucose, Bld 11/16/2016 94  65 - 99 mg/dL Final  . BUN 11/16/2016 17  6 - 20 mg/dL Final  . Creatinine, Ser 11/16/2016 0.74  0.44 - 1.00 mg/dL Final  . Calcium 11/16/2016 8.5* 8.9 - 10.3 mg/dL Final  . GFR calc non Af Amer 11/16/2016 >60  >60  mL/min Final  . GFR calc Af Amer 11/16/2016 >60  >60 mL/min Final   Comment: (NOTE) The eGFR has been calculated using the CKD EPI equation. This calculation has not been validated in all clinical situations. eGFR's persistently <60 mL/min signify possible Chronic Kidney Disease.   Georgiann Hahn gap 11/16/2016 8  5 - 15 Final  Hospital Outpatient Visit on 11/07/2016  Component Date Value Ref Range Status  . aPTT 11/07/2016 31  24 - 36 seconds Final  . WBC 11/07/2016 5.5  4.0 - 10.5 K/uL Final  . RBC 11/07/2016 4.11  3.87 - 5.11 MIL/uL Final  . Hemoglobin 11/07/2016 12.3  12.0 - 15.0 g/dL Final  . HCT 11/07/2016 37.6  36.0 - 46.0 % Final  . MCV 11/07/2016 91.5  78.0 - 100.0 fL Final  . MCH 11/07/2016 29.9  26.0 - 34.0 pg Final  . MCHC 11/07/2016 32.7  30.0 - 36.0 g/dL Final  . RDW 11/07/2016 13.2  11.5 - 15.5 % Final  . Platelets 11/07/2016 247  150 - 400 K/uL Final  . Sodium 11/07/2016 137  135 - 145 mmol/L Final  . Potassium 11/07/2016 4.6  3.5 - 5.1 mmol/L Final  . Chloride 11/07/2016 104  101 - 111 mmol/L Final  . CO2 11/07/2016 25  22 - 32 mmol/L Final  . Glucose, Bld 11/07/2016 86  65 - 99 mg/dL Final  . BUN 11/07/2016 25* 6 - 20 mg/dL Final  . Creatinine, Ser 11/07/2016 1.01* 0.44 - 1.00 mg/dL Final  . Calcium 11/07/2016 9.1  8.9 - 10.3 mg/dL Final  . Total Protein 11/07/2016 6.7  6.5 - 8.1 g/dL Final  . Albumin 11/07/2016 3.7  3.5 - 5.0 g/dL Final  . AST 11/07/2016 16  15 - 41 U/L Final  . ALT 11/07/2016 9* 14 - 54 U/L Final  . Alkaline Phosphatase 11/07/2016 46  38 - 126 U/L Final  . Total Bilirubin 11/07/2016 0.8  0.3 - 1.2 mg/dL Final  . GFR calc non Af Amer 11/07/2016 54* >60 mL/min Final  . GFR calc Af Amer 11/07/2016 >60  >60 mL/min Final   Comment: (NOTE) The eGFR has been calculated using the CKD EPI equation. This calculation has not been validated in all clinical situations. eGFR's persistently <60 mL/min signify possible Chronic Kidney Disease.   . Anion gap  11/07/2016 8  5 - 15 Final  . Prothrombin Time 11/07/2016 13.3  11.4 - 15.2 seconds Final  . INR 11/07/2016 1.02   Final  . ABO/RH(D) 11/07/2016 O POS   Final  .  Antibody Screen 11/07/2016 NEG   Final  . Sample Expiration 11/07/2016 11/17/2016   Final  . Extend sample reason 11/07/2016 NO TRANSFUSIONS OR PREGNANCY IN THE PAST 3 MONTHS   Final  . MRSA, PCR 11/07/2016 NEGATIVE  NEGATIVE Final  . Staphylococcus aureus 11/07/2016 NEGATIVE  NEGATIVE Final   Comment: (NOTE) The Xpert SA Assay (FDA approved for NASAL specimens in patients 3 years of age and older), is one component of a comprehensive surveillance program. It is not intended to diagnose infection nor to guide or monitor treatment.      X-Rays:No results found.  EKG: Orders placed or performed in visit on 09/19/16  . EKG 12-Lead     Hospital Course: Nicole Graves is a 71 y.o. who was admitted to [facility]. They were brought to the operating room on 11/14/2016 and underwent Procedure(s): LEFT TOTAL KNEE ARTHROPLASTY.  Patient tolerated the procedure well and was later transferred to the recovery room and then to the orthopaedic floor for postoperative care.  They were given PO and IV analgesics for pain control following their surgery.  They were given 24 hours of postoperative antibiotics of  Anti-infectives    Start     Dose/Rate Route Frequency Ordered Stop   11/14/16 1400  ceFAZolin (ANCEF) IVPB 2g/100 mL premix     2 g 200 mL/hr over 30 Minutes Intravenous Every 6 hours 11/14/16 1127 11/14/16 2100   11/14/16 0610  ceFAZolin (ANCEF) 2-4 GM/100ML-% IVPB    Comments:  Mardelle Matte   : cabinet override      11/14/16 0610 11/14/16 0830   11/14/16 0552  ceFAZolin (ANCEF) IVPB 2g/100 mL premix     2 g 200 mL/hr over 30 Minutes Intravenous On call to O.R. 11/14/16 9628 11/14/16 0830     and started on DVT prophylaxis in the form of Xarelto.   PT and OT were ordered for total joint protocol.  Discharge planning  consulted to help with postop disposition and equipment needs.  Patient had a tough night night on the evening of surgery.  They started to get up OOB with therapy on day one. Hemovac drain was pulled without difficulty.  Continued to work with therapy into day two.  Dressing was changed on day two and the incision was healing well. Patient was seen in rounds and was ready to go home.   Diet - Cardiac diet Follow up - in 2 weeks Activity - WBAT Disposition - Home Condition Upon Discharge - Stable D/C Meds - See DC Summary DVT Prophylaxis - Xarelto   Discharge Instructions    Call MD / Call 911    Complete by:  As directed    If you experience chest pain or shortness of breath, CALL 911 and be transported to the hospital emergency room.  If you develope a fever above 101 F, pus (white drainage) or increased drainage or redness at the wound, or calf pain, call your surgeon's office.   Change dressing    Complete by:  As directed    Change dressing daily with sterile 4 x 4 inch gauze dressing and apply TED hose. Do not submerge the incision under water.   Constipation Prevention    Complete by:  As directed    Drink plenty of fluids.  Prune juice may be helpful.  You may use a stool softener, such as Colace (over the counter) 100 mg twice a day.  Use MiraLax (over the counter) for constipation as needed.  Diet - low sodium heart healthy    Complete by:  As directed    Diet Carb Modified    Complete by:  As directed    Discharge instructions    Complete by:  As directed    Take Xarelto for two and a half more weeks, then discontinue Xarelto. Once the patient has completed the Xarelto, they may resume the 81 mg Aspirin.   Pick up stool softner and laxative for home use following surgery while on pain medications. Do not submerge incision under water. Please use good hand washing techniques while changing dressing each day. May shower starting three days after surgery. Please use a  clean towel to pat the incision dry following showers. Continue to use ice for pain and swelling after surgery. Do not use any lotions or creams on the incision until instructed by your surgeon.  Wear both TED hose on both legs during the day every day for three weeks, but may remove the TED hose at night at home.  Postoperative Constipation Protocol  Constipation - defined medically as fewer than three stools per week and severe constipation as less than one stool per week.  One of the most common issues patients have following surgery is constipation.  Even if you have a regular bowel pattern at home, your normal regimen is likely to be disrupted due to multiple reasons following surgery.  Combination of anesthesia, postoperative narcotics, change in appetite and fluid intake all can affect your bowels.  In order to avoid complications following surgery, here are some recommendations in order to help you during your recovery period.  Colace (docusate) - Pick up an over-the-counter form of Colace or another stool softener and take twice a day as long as you are requiring postoperative pain medications.  Take with a full glass of water daily.  If you experience loose stools or diarrhea, hold the colace until you stool forms back up.  If your symptoms do not get better within 1 week or if they get worse, check with your doctor.  Dulcolax (bisacodyl) - Pick up over-the-counter and take as directed by the product packaging as needed to assist with the movement of your bowels.  Take with a full glass of water.  Use this product as needed if not relieved by Colace only.   MiraLax (polyethylene glycol) - Pick up over-the-counter to have on hand.  MiraLax is a solution that will increase the amount of water in your bowels to assist with bowel movements.  Take as directed and can mix with a glass of water, juice, soda, coffee, or tea.  Take if you go more than two days without a movement. Do not use MiraLax  more than once per day. Call your doctor if you are still constipated or irregular after using this medication for 7 days in a row.  If you continue to have problems with postoperative constipation, please contact the office for further assistance and recommendations.  If you experience "the worst abdominal pain ever" or develop nausea or vomiting, please contact the office immediatly for further recommendations for treatment.   Do not put a pillow under the knee. Place it under the heel.    Complete by:  As directed    Do not sit on low chairs, stoools or toilet seats, as it may be difficult to get up from low surfaces    Complete by:  As directed    Driving restrictions    Complete by:  As directed  No driving until released by the physician.   Increase activity slowly as tolerated    Complete by:  As directed    Lifting restrictions    Complete by:  As directed    No lifting until released by the physician.   Patient may shower    Complete by:  As directed    You may shower without a dressing once there is no drainage.  Do not wash over the wound.  If drainage remains, do not shower until drainage stops.   TED hose    Complete by:  As directed    Use stockings (TED hose) for 3 weeks on both leg(s).  You may remove them at night for sleeping.   Weight bearing as tolerated    Complete by:  As directed    Laterality:  left   Extremity:  Lower     Allergies as of 11/16/2016      Reactions   Sulfa Antibiotics Nausea And Vomiting      Medication List    STOP taking these medications   alendronate 70 MG tablet Commonly known as:  FOSAMAX   aspirin EC 81 MG tablet   CALCIUM PO   celecoxib 200 MG capsule Commonly known as:  CELEBREX   diclofenac sodium 1 % Gel Commonly known as:  VOLTAREN   VITAMIN D PO     TAKE these medications   atorvastatin 10 MG tablet Commonly known as:  LIPITOR Take 10 mg by mouth at bedtime.   buPROPion 300 MG 24 hr tablet Commonly known  as:  WELLBUTRIN XL Take 300 mg by mouth daily after breakfast.   cyclobenzaprine 10 MG tablet Commonly known as:  FLEXERIL Take 10 mg by mouth at bedtime.   DULoxetine 30 MG capsule Commonly known as:  CYMBALTA Take 90 mg by mouth daily after breakfast.   loratadine 10 MG tablet Commonly known as:  CLARITIN Take 10 mg by mouth daily at 6 (six) AM.   Lurasidone HCl 60 MG Tabs Take 60 mg by mouth daily at 6 PM.   Melatonin 5 MG Tabs Take 5 mg by mouth at bedtime.   methocarbamol 500 MG tablet Commonly known as:  ROBAXIN Take 1 tablet (500 mg total) by mouth every 6 (six) hours as needed for muscle spasms.   metoprolol succinate 25 MG 24 hr tablet Commonly known as:  TOPROL-XL Take 1 tablet (25 mg total) by mouth at bedtime and may repeat dose one time if needed.   omeprazole 20 MG capsule Commonly known as:  PRILOSEC Take 20 mg by mouth daily at 6 (six) AM.   oxyCODONE-acetaminophen 5-325 MG tablet Commonly known as:  ROXICET Take 1-2 tablets by mouth every 4 (four) hours as needed for severe pain.   rivaroxaban 10 MG Tabs tablet Commonly known as:  XARELTO Take 1 tablet (10 mg total) by mouth daily with breakfast. Take Xarelto for two and a half more weeks following discharge from the hospital, then discontinue Xarelto. Once the patient has completed the Xarelto, they may resume the 81 mg Aspirin.   traMADol 50 MG tablet Commonly known as:  ULTRAM Take 1-2 tablets (50-100 mg total) by mouth every 6 (six) hours as needed for moderate pain. What changed:  how much to take  when to take this            Discharge Care Instructions        Start     Ordered   11/15/16 0000  Weight bearing as tolerated    Question Answer Comment  Laterality left   Extremity Lower      11/15/16 2226   11/15/16 0000  Change dressing    Comments:  Change dressing daily with sterile 4 x 4 inch gauze dressing and apply TED hose. Do not submerge the incision under water.    11/15/16 2226     Follow-up Information    [facility] Follow up.   Specialty:  [place] Why:  For home health physical therapy Contact information: [place] [address] [phone]        Gaynelle Arabian, MD. Schedule an appointment as soon as possible for a visit on 11/29/2016.   Specialty:  Orthopedic Surgery Contact information: [address] [place] [address] [phone]           Signed: Arlee Muslim, PA-C Orthopaedic Surgery 11/16/2016, 7:22 AM

## 2016-11-15 NOTE — Progress Notes (Signed)
   Subjective: 1 Day Post-Op Procedure(s) (LRB): LEFT TOTAL KNEE ARTHROPLASTY (Left) Patient reports pain as mild to moderate.  Tough night following surgery.  Patient seen in rounds for Dr. Lequita Halt. Patient is well, but has had some minor complaints of pain in the knee, requiring pain medications We will start therapy today.  Plan is to go Home after hospital stay.  Objective: Vital signs in last 24 hours: Temp:  [98 F (36.7 C)-98.5 F (36.9 C)] 98 F (36.7 C) (10/02 0520) Pulse Rate:  [88-102] 95 (10/02 0520) Resp:  [15-18] 15 (10/02 0520) BP: (102-136)/(61-80) 103/69 (10/02 0520) SpO2:  [95 %-100 %] 98 % (10/02 0520)  Intake/Output from previous day:  Intake/Output Summary (Last 24 hours) at 11/15/16 1131 Last data filed at 11/15/16 0830  Gross per 24 hour  Intake             2560 ml  Output             3780 ml  Net            -1220 ml    Intake/Output this shift: Total I/O In: 120 [P.O.:120] Out: 450 [Urine:450]  Labs:  Recent Labs  11/15/16 0538  HGB 10.6*    Recent Labs  11/15/16 0538  WBC 9.6  RBC 3.65*  HCT 33.4*  PLT 233    Recent Labs  11/15/16 0538  NA 139  K 4.4  CL 107  CO2 27  BUN 21*  CREATININE 0.88  GLUCOSE 106*  CALCIUM 8.3*   No results for input(s): LABPT, INR in the last 72 hours.  EXAM General - Patient is Alert, Appropriate and Oriented Extremity - Neurovascular intact Sensation intact distally Intact pulses distally Dorsiflexion/Plantar flexion intact Dressing - dressing C/D/I Motor Function - intact, moving foot and toes well on exam.  Hemovac pulled without difficulty.  Past Medical History:  Diagnosis Date  . Anemia   . Anginal pain ([facility]) 01/2015  . Aortic stenosis    Echo 01/2016: mild LVH, vigorous LVF, no RWMA, Gr 1 DD, mild to moderate AS (mean 16 mmHg), no AI, mild LAE, PASP 42 mmHg  . Arthritis    Knees, neck, back  . Chronic lower back pain    with siatica  . Colon polyp 1999  . Depression   .  Dysrhythmia   . GERD (gastroesophageal reflux disease)   . PONV (postoperative nausea and vomiting)   . SVT (supraventricular tachycardia) ([facility])   . TR (tricuspid regurgitation) 09/19/2016    Assessment/Plan: 1 Day Post-Op Procedure(s) (LRB): LEFT TOTAL KNEE ARTHROPLASTY (Left) Active Problems:   OA (osteoarthritis) of knee  Estimated body mass index is 31.28 kg/m as calculated from the following:   Height as of this encounter:  (1.651 m).   Weight as of this encounter: 85.3 kg (188 lb). Advance diet Up with therapy Plan for discharge tomorrow Discharge home with home health  DVT Prophylaxis - Xarelto Weight-Bearing as tolerated to left leg D/C O2 and Pulse OX and try on Room Air  Avel Peace, PA-C Orthopaedic Surgery 11/15/2016, 11:31 AM

## 2016-11-15 NOTE — Progress Notes (Signed)
Physical Therapy Treatment Patient Details Name: Nicole Graves MRN: [id number] DOB: April 05, 1945 Today's Date: 11/15/2016    History of Present Illness L TKA, RTKA 1 year ago    PT Comments    POD # 1 pm session Assisted with amb a second time in hallway, practiced stairs then returned to room to complete TKR TE's followed by ICE.   Follow Up Recommendations  Home health PT     Equipment Recommendations  None recommended by PT    Recommendations for Other Services       Precautions / Restrictions Precautions Precautions: Fall Precaution Comments: instructed on KI use for amb and stairs Required Braces or Orthoses: Knee Immobilizer - Left Knee Immobilizer - Left: Discontinue once straight leg raise with < 10 degree lag Restrictions Weight Bearing Restrictions: No Other Position/Activity Restrictions: WBAT    Mobility  Bed Mobility               General bed mobility comments: OOB in recliner  Transfers Overall transfer level: Needs assistance Equipment used: Rolling walker (2 wheeled) Transfers: Sit to/from Stand Sit to Stand: Min guard         General transfer comment: cues for sequence and posture, hand placement  Ambulation/Gait Ambulation/Gait assistance: Min guard Ambulation Distance (Feet): 55 Feet Assistive device: Rolling walker (2 wheeled) Gait Pattern/deviations: Step-to pattern;Antalgic Gait velocity: decreased   General Gait Details: 25% cues for sequence and posture   Stairs Stairs: Yes   Stair Management: Two rails;Step to pattern;Forwards Number of Stairs: 2 General stair comments: 25% VC's on proper sequencing and safety  Wheelchair Mobility    Modified Rankin (Stroke Patients Only)       Balance                                            Cognition Arousal/Alertness: Awake/alert Behavior During Therapy: WFL for tasks assessed/performed Overall Cognitive Status: Within Functional Limits for tasks  assessed                                        Exercises   Total Knee Replacement TE's 10 reps B LE ankle pumps 10 reps towel squeezes 10 reps knee presses 10 reps heel slides  10 reps SLR's 10 reps ABD Followed by ICE     General Comments        Pertinent Vitals/Pain Pain Assessment: 0-10 Pain Score: 4  Pain Location: left knee Pain Descriptors / Indicators: Aching;Burning;Sore;Operative site guarding Pain Intervention(s): Monitored during session;Repositioned;Ice applied    Home Living                      Prior Function            PT Goals (current goals can now be found in the care plan section) Progress towards PT goals: Progressing toward goals    Frequency    7X/week      PT Plan Current plan remains appropriate    Co-evaluation              AM-PAC PT "6 Clicks" Daily Activity  Outcome Measure  Difficulty turning over in bed (including adjusting bedclothes, sheets and blankets)?: A Little Difficulty moving from lying on back to sitting on the side of the bed? :  A Little Difficulty sitting down on and standing up from a chair with arms (e.g., wheelchair, bedside commode, etc,.)?: Unable Help needed moving to and from a bed to chair (including a wheelchair)?: Total Help needed walking in hospital room?: Total Help needed climbing 3-5 steps with a railing? : Total 6 Click Score: 10    End of Session Equipment Utilized During Treatment: Gait belt Activity Tolerance: Patient tolerated treatment well Patient left: in chair;with call bell/phone within reach Nurse Communication: Mobility status PT Visit Diagnosis: Difficulty in walking, not elsewhere classified (R26.2);Pain Pain - Right/Left: Left     Time: 8295-6213 PT Time Calculation (min) (ACUTE ONLY): 28 min  Charges:  $Gait Training: 8-22 mins $Therapeutic Activity: 8-22 mins                    G Codes:       Felecia Shelling  PTA [facility]  Acute  Rehab Pager       [phone]

## 2016-11-15 NOTE — Progress Notes (Signed)
OT Cancellation Note  Patient Details Name: Nicole Graves MRN: [id number] DOB: [birth year]   Cancelled Treatment:    Reason Eval/Treat Not Completed: OT screened, no needs identified, will sign off  Claudine Stallings, [place] 11/15/2016, 1:16 PM

## 2016-11-15 NOTE — Progress Notes (Signed)
Physical Therapy Treatment Patient Details Name: Nicole Graves MRN: [id number] DOB: 05/16/1945 Today's Date: 11/15/2016    History of Present Illness L TKA, RTKA 1 year ago    PT Comments    POD # 1 am session Applied KI and instructed on use for amb and esp for stairs.  Assisted with amb a greater distance in hallway then returned to room to performed TKR TE's followed by ICE.   Follow Up Recommendations  Home health PT     Equipment Recommendations  None recommended by PT    Recommendations for Other Services       Precautions / Restrictions Precautions Precautions: Fall Precaution Comments: instructed on KI use for amb and stairs Required Braces or Orthoses: Knee Immobilizer - Left Knee Immobilizer - Left: Discontinue once straight leg raise with < 10 degree lag Restrictions Weight Bearing Restrictions: No Other Position/Activity Restrictions: WBAT    Mobility  Bed Mobility               General bed mobility comments: OOB in recliner  Transfers Overall transfer level: Needs assistance Equipment used: Rolling walker (2 wheeled) Transfers: Sit to/from Stand Sit to Stand: Min guard         General transfer comment: cues for sequence and posture, hand placement  Ambulation/Gait Ambulation/Gait assistance: Min guard Ambulation Distance (Feet): 55 Feet Assistive device: Rolling walker (2 wheeled) Gait Pattern/deviations: Step-to pattern;Antalgic Gait velocity: decreased   General Gait Details: 25% cues for sequence and posture   Stairs            Wheelchair Mobility    Modified Rankin (Stroke Patients Only)       Balance                                            Cognition Arousal/Alertness: Awake/alert Behavior During Therapy: WFL for tasks assessed/performed Overall Cognitive Status: Within Functional Limits for tasks assessed                                        Exercises   Total Knee  Replacement TE's 10 reps B LE ankle pumps 10 reps towel squeezes 10 reps knee presses 10 reps heel slides  10 reps SLR's 10 reps ABD Followed by ICE     General Comments        Pertinent Vitals/Pain Pain Assessment: 0-10 Pain Score: 4  Pain Location: left knee Pain Descriptors / Indicators: Aching;Burning;Sore;Operative site guarding Pain Intervention(s): Monitored during session;Repositioned;Ice applied    Home Living                      Prior Function            PT Goals (current goals can now be found in the care plan section) Progress towards PT goals: Progressing toward goals    Frequency    7X/week      PT Plan Current plan remains appropriate    Co-evaluation              AM-PAC PT "6 Clicks" Daily Activity  Outcome Measure  Difficulty turning over in bed (including adjusting bedclothes, sheets and blankets)?: A Little Difficulty moving from lying on back to sitting on the side of the bed? : A Little Difficulty  sitting down on and standing up from a chair with arms (e.g., wheelchair, bedside commode, etc,.)?: Unable Help needed moving to and from a bed to chair (including a wheelchair)?: Total Help needed walking in hospital room?: Total Help needed climbing 3-5 steps with a railing? : Total 6 Click Score: 10    End of Session Equipment Utilized During Treatment: Gait belt Activity Tolerance: Patient tolerated treatment well Patient left: in chair;with call bell/phone within reach Nurse Communication: Mobility status PT Visit Diagnosis: Difficulty in walking, not elsewhere classified (R26.2);Pain Pain - Right/Left: Left     Time: 4098-1191 PT Time Calculation (min) (ACUTE ONLY): 24 min  Charges:  $Gait Training: 8-22 mins $Therapeutic Exercise: 8-22 mins                    G Codes:       Felecia Shelling  PTA [facility]  Acute  Rehab Pager      [phone]

## 2016-11-15 NOTE — Progress Notes (Signed)
Discharge plan:  Pt for HHPT and choice offered for home health services. [facility] chosen and rep aware of referral. Pt has RW and 3in1 at home.  Sandford Craze RN,BSN,NCM [phone]

## 2016-11-16 LAB — BASIC METABOLIC PANEL
Anion gap: 8 (ref 5–15)
BUN: 17 mg/dL (ref 6–20)
CO2: 26 mmol/L (ref 22–32)
Calcium: 8.5 mg/dL — ABNORMAL LOW (ref 8.9–10.3)
Chloride: 103 mmol/L (ref 101–111)
Creatinine, Ser: 0.74 mg/dL (ref 0.44–1.00)
GFR calc Af Amer: >60 mL/min (ref >60)
GLUCOSE: 94 mg/dL (ref 65–99)
Potassium: 3.9 mmol/L (ref 3.5–5.1)
Sodium: 137 mmol/L (ref 135–145)

## 2016-11-16 LAB — CBC
HCT: 32 % — ABNORMAL LOW (ref 36.0–46.0)
Hemoglobin: 10.5 g/dL — ABNORMAL LOW (ref 12.0–15.0)
MCH: 29.7 pg (ref 26.0–34.0)
MCHC: 32.8 g/dL (ref 30.0–36.0)
MCV: 90.4 fL (ref 78.0–100.0)
PLATELETS: 249 10*3/uL (ref 150–400)
RBC: 3.54 MIL/uL — AB (ref 3.87–5.11)
RDW: 13.3 % (ref 11.5–15.5)
WBC: 9.2 10*3/uL (ref 4.0–10.5)

## 2016-11-16 MED ORDER — OXYCODONE-ACETAMINOPHEN 5-325 MG PO TABS: 1.0000 | ORAL_TABLET | ORAL | 0 refills | Status: DC | PRN

## 2016-11-16 MED ORDER — RIVAROXABAN 10 MG PO TABS: 10.0000 mg | ORAL_TABLET | Freq: Every day | ORAL | 0 refills | Status: DC

## 2016-11-16 MED ORDER — TRAMADOL HCL 50 MG PO TABS: 50.0000 mg | ORAL_TABLET | Freq: Four times a day (QID) | ORAL | 0 refills | Status: DC | PRN

## 2016-11-16 MED ORDER — METHOCARBAMOL 500 MG PO TABS: 500.0000 mg | ORAL_TABLET | Freq: Four times a day (QID) | ORAL | 0 refills | Status: DC | PRN

## 2016-11-16 NOTE — Progress Notes (Signed)
Physical Therapy Treatment Patient Details Name: Nicole Graves MRN: [id number] DOB: Nov 04, 1945 Today's Date: 11/16/2016    History of Present Illness L TKA, RTKA 1 year ago    PT Comments    POD # 2 am session Assisted with amb in hallway, practiced stairs and performed all supine TKR TE's following HEP handout followed by ICE.  Daughter present and "hands on" assisted with stairs.  This is pt's second TKR and is very knowledgeable.    Follow Up Recommendations  Home health PT     Equipment Recommendations  None recommended by PT    Recommendations for Other Services       Precautions / Restrictions Precautions Precautions: Fall Restrictions Weight Bearing Restrictions: No Other Position/Activity Restrictions: WBAT    Mobility  Bed Mobility               General bed mobility comments: OOB in recliner  Transfers Overall transfer level: Needs assistance Equipment used: Rolling walker (2 wheeled) Transfers: Sit to/from Stand Sit to Stand: Min guard         General transfer comment: cues for sequence and posture, hand placement  Ambulation/Gait Ambulation/Gait assistance: Min guard;Supervision Ambulation Distance (Feet): 85 Feet Assistive device: Rolling walker (2 wheeled) Gait Pattern/deviations: Step-to pattern;Antalgic Gait velocity: decreased   General Gait Details: <25% cues for sequence and posture   Stairs Stairs: Yes   Stair Management: Two rails;Step to pattern;Forwards Number of Stairs: 2 General stair comments: 25% VC's on proper sequencing and safety   Daughter present   Wheelchair Mobility    Modified Rankin (Stroke Patients Only)       Balance                                            Cognition Arousal/Alertness: Awake/alert Behavior During Therapy: WFL for tasks assessed/performed Overall Cognitive Status: Within Functional Limits for tasks assessed                                         Exercises   Total Knee Replacement TE's 10 reps B LE ankle pumps 10 reps towel squeezes 10 reps knee presses 10 reps heel slides  10 reps SAQ's 10 reps SLR's 10 reps ABD Followed by ICE     General Comments        Pertinent Vitals/Pain Pain Assessment: 0-10 Pain Score: 3  Pain Location: left knee Pain Descriptors / Indicators: Aching;Burning;Sore;Operative site guarding Pain Intervention(s): Monitored during session;Repositioned;Ice applied    Home Living                      Prior Function            PT Goals (current goals can now be found in the care plan section) Progress towards PT goals: Progressing toward goals    Frequency    7X/week      PT Plan Current plan remains appropriate    Co-evaluation              AM-PAC PT "6 Clicks" Daily Activity  Outcome Measure  Difficulty turning over in bed (including adjusting bedclothes, sheets and blankets)?: A Little Difficulty moving from lying on back to sitting on the side of the bed? : A Little Difficulty sitting down on  and standing up from a chair with arms (e.g., wheelchair, bedside commode, etc,.)?: Unable Help needed moving to and from a bed to chair (including a wheelchair)?: Total Help needed walking in hospital room?: Total Help needed climbing 3-5 steps with a railing? : Total 6 Click Score: 10    End of Session Equipment Utilized During Treatment: Gait belt Activity Tolerance: Patient tolerated treatment well Patient left: in chair;with call bell/phone within reach Nurse Communication:  (pt ready for D/C to home) PT Visit Diagnosis: Difficulty in walking, not elsewhere classified (R26.2);Pain Pain - Right/Left: Left Pain - part of body: Knee     Time: 1610-9604 PT Time Calculation (min) (ACUTE ONLY): 25 min  Charges:  $Gait Training: 8-22 mins $Therapeutic Exercise: 8-22 mins                    G Codes:       Felecia Shelling  PTA [facility]  Acute  Rehab Pager       [phone]

## 2016-11-16 NOTE — Progress Notes (Signed)
   Subjective: 2 Days Post-Op Procedure(s) (LRB): LEFT TOTAL KNEE ARTHROPLASTY (Left) Patient reports pain as mild and moderate.   Patient seen in rounds with Dr. Lequita Halt.  Change pain med to Percocet Patient is well, but has had some minor complaints of pain in the knee, requiring pain medications Patient is ready to go home  Objective: Vital signs in last 24 hours: Temp:  [98.2 F (36.8 C)-98.6 F (37 C)] 98.4 F (36.9 C) (10/03 0531) Pulse Rate:  [86-109] 86 (10/03 0531) Resp:  [15-16] 16 (10/03 0531) BP: (130-145)/(76-86) 130/76 (10/03 0531) SpO2:  [94 %-96 %] 96 % (10/03 0531)  Intake/Output from previous day:  Intake/Output Summary (Last 24 hours) at 11/16/16 0720 Last data filed at 11/16/16 0600  Gross per 24 hour  Intake             1140 ml  Output             2151 ml  Net            -1011 ml    Intake/Output this shift: No intake/output data recorded.  Labs:  Recent Labs  11/15/16 0538 11/16/16 0541  HGB 10.6* 10.5*    Recent Labs  11/15/16 0538 11/16/16 0541  WBC 9.6 9.2  RBC 3.65* 3.54*  HCT 33.4* 32.0*  PLT 233 249    Recent Labs  11/15/16 0538 11/16/16 0541  NA 139 137  K 4.4 3.9  CL 107 103  CO2 27 26  BUN 21* 17  CREATININE 0.88 0.74  GLUCOSE 106* 94  CALCIUM 8.3* 8.5*   No results for input(s): LABPT, INR in the last 72 hours.  EXAM: General - Patient is Alert, Appropriate and Oriented Extremity - Neurovascular intact Sensation intact distally Incision - clean, dry, no drainage Motor Function - intact, moving foot and toes well on exam.   Assessment/Plan: 2 Days Post-Op Procedure(s) (LRB): LEFT TOTAL KNEE ARTHROPLASTY (Left) Procedure(s) (LRB): LEFT TOTAL KNEE ARTHROPLASTY (Left) Past Medical History:  Diagnosis Date  . Anemia   . Anginal pain (HCC) 01/2015  . Aortic stenosis    Echo 01/2016: mild LVH, vigorous LVF, no RWMA, Gr 1 DD, mild to moderate AS (mean 16 mmHg), no AI, mild LAE, PASP 42 mmHg  . Arthritis    Knees, neck, back  . Chronic lower back pain    with siatica  . Colon polyp 1999  . Depression   . Dysrhythmia   . GERD (gastroesophageal reflux disease)   . PONV (postoperative nausea and vomiting)   . SVT (supraventricular tachycardia) (HCC)   . TR (tricuspid regurgitation) 09/19/2016   Active Problems:   OA (osteoarthritis) of knee  Estimated body mass index is 31.28 kg/m as calculated from the following:   Height as of this encounter:  (1.651 m).   Weight as of this encounter: 85.3 kg (188 lb). Up with therapy Diet - Cardiac diet Follow up - in 2 weeks Activity - WBAT Disposition - Home Condition Upon Discharge - Stable D/C Meds - See DC Summary DVT Prophylaxis - Xarelto  Avel Peace, PA-C Orthopaedic Surgery 11/16/2016, 7:20 AM

## 2016-11-25 NOTE — Addendum Note (Signed)
Addendum  created 11/25/16 1324 by Kipp Brood, MD   Anesthesia Intra Blocks edited, Sign clinical note

## 2017-01-24 ENCOUNTER — Other Ambulatory Visit: Payer: Self-pay | Admitting: Cardiology

## 2017-01-24 DIAGNOSIS — I471 Supraventricular tachycardia: Secondary | ICD-10-CM

## 2017-03-23 ENCOUNTER — Other Ambulatory Visit (HOSPITAL_COMMUNITY): Payer: Medicare Other

## 2017-08-21 ENCOUNTER — Encounter: Payer: Self-pay | Admitting: Cardiology

## 2017-09-25 ENCOUNTER — Ambulatory Visit (HOSPITAL_COMMUNITY): Payer: Medicare Other | Attending: Cardiology

## 2017-09-25 ENCOUNTER — Other Ambulatory Visit: Payer: Self-pay

## 2017-09-25 ENCOUNTER — Encounter (INDEPENDENT_AMBULATORY_CARE_PROVIDER_SITE_OTHER): Payer: Self-pay

## 2017-09-25 DIAGNOSIS — I350 Nonrheumatic aortic (valve) stenosis: Secondary | ICD-10-CM | POA: Diagnosis not present

## 2017-09-25 DIAGNOSIS — I351 Nonrheumatic aortic (valve) insufficiency: Secondary | ICD-10-CM | POA: Diagnosis not present

## 2017-09-25 DIAGNOSIS — I071 Rheumatic tricuspid insufficiency: Secondary | ICD-10-CM

## 2017-09-25 DIAGNOSIS — I471 Supraventricular tachycardia: Secondary | ICD-10-CM | POA: Diagnosis not present

## 2017-09-25 DIAGNOSIS — I951 Orthostatic hypotension: Secondary | ICD-10-CM | POA: Diagnosis not present

## 2017-10-05 ENCOUNTER — Ambulatory Visit: Payer: Medicare Other | Admitting: Cardiology

## 2017-10-25 ENCOUNTER — Other Ambulatory Visit: Payer: Self-pay | Admitting: Cardiology

## 2017-10-25 DIAGNOSIS — I471 Supraventricular tachycardia: Secondary | ICD-10-CM

## 2017-11-26 NOTE — Progress Notes (Signed)
Cardiology Office Note:    Date:  11/27/2017   ID:  Nicole Graves, DOB 14-Nov-1945, MRN [id number]  PCP:  [name], MD  Cardiologist:  No primary care provider on file.    Referring MD: [name], MD   Chief Complaint  Patient presents with  . Aortic Stenosis    History of Present Illness:    Nicole Graves is a 73 y.o. female with a hx of mild AS, AI and SVT.  She is here today for followup and is doing well.  She denies any chest pain or pressure, SOB, DOE, PND, orthopnea, LE edema, dizziness, palpitations or syncope. She is compliant with her meds and is tolerating meds with no SE.    Past Medical History:  Diagnosis Date  . Anemia   . Anginal pain (HCC) 01/2015  . Aortic stenosis    Echo 01/2016: mild LVH, vigorous LVF, no RWMA, Gr 1 DD, mild to moderate AS (mean 16 mmHg), no AI, mild LAE, PASP 42 mmHg  . Arthritis    Knees, neck, back  . Chronic lower back pain    with siatica  . Colon polyp 1999  . Depression   . Dysrhythmia   . GERD (gastroesophageal reflux disease)   . PONV (postoperative nausea and vomiting)   . SVT (supraventricular tachycardia) (HCC)   . TR (tricuspid regurgitation) 09/19/2016    Past Surgical History:  Procedure Laterality Date  . CESAREAN SECTION  1964   X2  . CHOLECYSTECTOMY    . COSMETIC SURGERY  1980   removal extra fat inner thighs  . EYE SURGERY Bilateral     Cataract extraction with IOL  . TOTAL KNEE ARTHROPLASTY Right 11/30/2015   Procedure: RIGHT TOTAL KNEE ARTHROPLASTY;  Surgeon: Ollen Gross, MD;  Location: [facility];  Service: Orthopedics;  Laterality: Right;  . TOTAL KNEE ARTHROPLASTY Left 11/14/2016   Procedure: LEFT TOTAL KNEE ARTHROPLASTY;  Surgeon: Ollen Gross, MD;  Location: [facility];  Service: Orthopedics;  Laterality: Left;    Current Medications: Current Meds  Medication Sig  . atorvastatin (LIPITOR) 10 MG tablet Take 10 mg by mouth at bedtime.   Marland Kitchen buPROPion (WELLBUTRIN XL) 300 MG 24 hr tablet Take 300 mg by  mouth daily after breakfast.   . cyclobenzaprine (FLEXERIL) 10 MG tablet Take 10 mg by mouth at bedtime.  . DULoxetine (CYMBALTA) 30 MG capsule Take 90 mg by mouth daily after breakfast.   . Lurasidone HCl 60 MG TABS Take 60 mg by mouth daily at 6 PM.   . metFORMIN (GLUCOPHAGE) 500 MG tablet Take 500 mg by mouth 2 (two) times daily.  . metoprolol succinate (TOPROL-XL) 25 MG 24 hr tablet Take 1 tablet (25 mg total) by mouth at bedtime. Please keep upcoming appt in October with Dr. [name] for future refills. Thank you  . omeprazole (PRILOSEC) 20 MG capsule Take 20 mg by mouth daily at 6 (six) AM.   . traMADol (ULTRAM) 50 MG tablet Take 1-2 tablets (50-100 mg total) by mouth every 6 (six) hours as needed for moderate pain.     Allergies:   Sulfa antibiotics   Social History   Socioeconomic History  . Marital status: Divorced    Spouse name: Not on file  . Number of children: Not on file  . Years of education: Not on file  . Highest education level: Not on file  Occupational History  . Not on file  Social Needs  . Financial resource strain: Not on  file  . Food insecurity:    Worry: Not on file    Inability: Not on file  . Transportation needs:    Medical: Not on file    Non-medical: Not on file  Tobacco Use  . Smoking status: Never Smoker  . Smokeless tobacco: Never Used  Substance and Sexual Activity  . Alcohol use: No  . Drug use: No  . Sexual activity: Not on file  Lifestyle  . Physical activity:    Days per week: Not on file    Minutes per session: Not on file  . Stress: Not on file  Relationships  . Social connections:    Talks on phone: Not on file    Gets together: Not on file    Attends religious service: Not on file    Active member of club or organization: Not on file    Attends meetings of clubs or organizations: Not on file    Relationship status: Not on file  Other Topics Concern  . Not on file  Social History Narrative  . Not on file     Family  History: The patient's family history includes Colon cancer in her mother; Heart attack in her brother; Hypertension in her brother. There is no history of Stroke.  ROS:   Please see the history of present illness.    ROS  All other systems reviewed and negative.   EKGs/Labs/Other Studies Reviewed:    The following studies were reviewed today: none  EKG:  EKG is  ordered today.  The ekg ordered today demonstrates   Recent Labs: No results found for requested labs within last 8760 hours.   Recent Lipid Panel No results found for: CHOL, TRIG, HDL, CHOLHDL, VLDL, LDLCALC, LDLDIRECT  Physical Exam:    VS:  BP 128/78   Pulse 84   Ht 5\' 5"  (1.651 m)   Wt 187 lb (84.8 kg)   BMI 31.12 kg/m     Wt Readings from Last 3 Encounters:  11/27/17 187 lb (84.8 kg)  11/14/16 188 lb (85.3 kg)  11/07/16 188 lb 6 oz (85.4 kg)     GEN:  Well nourished, well developed in no acute distress HEENT: Normal NECK: No JVD; No carotid bruits LYMPHATICS: No lymphadenopathy CARDIAC: RRR, no murmurs, rubs, gallops RESPIRATORY:  Clear to auscultation without rales, wheezing or rhonchi  ABDOMEN: Soft, non-tender, non-distended MUSCULOSKELETAL:  No edema; No deformity  SKIN: Warm and dry NEUROLOGIC:  Alert and oriented x 3 PSYCHIATRIC:  Normal affect   ASSESSMENT:    1. Paroxysmal atrial tachycardia (HCC)   2. Orthostatic hypotension   3. Nonrheumatic aortic valve stenosis    PLAN:    In order of problems listed above:  1.  Paroxysmal atrial tachycardia- she has not had any reoccurrence and denies any palpitations.  She will continue on Toprol XL 25mg  daily.    2.  Aortic stenosis - mild by echo 09/2017  3.  Orthostatic hypotension - she has not had any significant dizziness or syncope since I saw her last.    Medication Adjustments/Labs and Tests Ordered: Current medicines are reviewed at length with the patient today.  Concerns regarding medicines are outlined above.  No orders of the  defined types were placed in this encounter.  No orders of the defined types were placed in this encounter.   Signed, [name], MD  11/27/2017 8:39 AM    [facility]

## 2017-11-27 ENCOUNTER — Ambulatory Visit: Payer: Medicare Other | Admitting: Cardiology

## 2017-11-27 ENCOUNTER — Encounter: Payer: Self-pay | Admitting: Cardiology

## 2017-11-27 VITALS — BP 128/78 | HR 84 | Ht 65.0 in | Wt 187.0 lb

## 2017-11-27 DIAGNOSIS — I471 Supraventricular tachycardia: Secondary | ICD-10-CM | POA: Diagnosis not present

## 2017-11-27 DIAGNOSIS — I350 Nonrheumatic aortic (valve) stenosis: Secondary | ICD-10-CM

## 2017-11-27 DIAGNOSIS — I951 Orthostatic hypotension: Secondary | ICD-10-CM

## 2017-11-27 MED ORDER — METOPROLOL SUCCINATE ER 25 MG PO TB24: 25.0000 mg | ORAL_TABLET | Freq: Every day | ORAL | 3 refills | Status: DC

## 2017-11-27 NOTE — Patient Instructions (Signed)
Medication Instructions:  Refilled Toprol   If you need a refill on your cardiac medications before your next appointment, please call your pharmacy.   Lab work:  If you have labs (blood work) drawn today and your tests are completely normal, you will receive your results only by: Marland Kitchen MyChart Message (if you have MyChart) OR . A paper copy in the mail If you have any lab test that is abnormal or we need to change your treatment, we will call you to review the results.  Follow-Up: At [facility], you and your health needs are our priority.  As part of our continuing mission to provide you with exceptional heart care, we have created designated Provider Care Teams.  These Care Teams include your primary Cardiologist (physician) and Advanced Practice Providers (APPs -  Physician Assistants and Nurse Practitioners) who all work together to provide you with the care you need, when you need it. You will need a follow up appointment in 1 years.  Please call our office 2 months in advance to schedule this appointment.  You may see Dr. [name] or one of the following Advanced Practice Providers on your designated Care Team:   Clifton Forge, PA-C Ronie Spies, PA-C . Jacolyn Reedy, PA-C

## 2018-07-25 IMAGING — NM NM MISC PROCEDURE
6 series · 36 of 36 positions shown · non-contrast
Comparison: none

[Series 1: stress · 6.51mm/px · 6 of 64 frames shown (1 of 2)]
[frame 6/64]
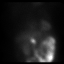
[frame 16/64]
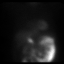
[frame 27/64]
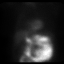
[frame 38/64]
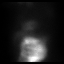
[frame 48/64]
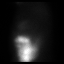
[frame 59/64]
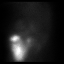

[Series 1: stress · 6.51mm/px · 6 of 512 frames shown (2 of 2)]
[frame 43/512]
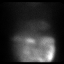
[frame 128/512]
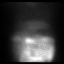
[frame 214/512]
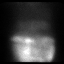
[frame 299/512]
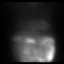
[frame 384/512]
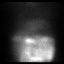
[frame 470/512]
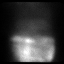

[Series 1: wbr_s-proj_st stress · 6.51mm/px · 6 of 512 frames shown (1 of 2)]
[frame 43/512]
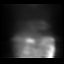
[frame 128/512]
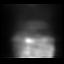
[frame 214/512]
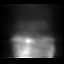
[frame 299/512]
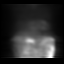
[frame 384/512]
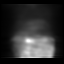
[frame 470/512]
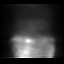

[Series 1: wbr_s-proj_st stress · 6.51mm/px · 6 of 64 frames shown (2 of 2)]
[frame 6/64]
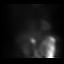
[frame 16/64]
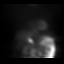
[frame 27/64]
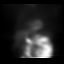
[frame 38/64]
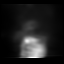
[frame 48/64]
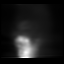
[frame 59/64]
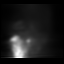

[Series 2: wbr_r-proj_st rest · 6.51mm/px · 6 of 64 frames shown]
[frame 6/64]
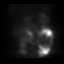
[frame 16/64]
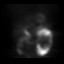
[frame 27/64]
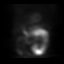
[frame 38/64]
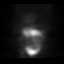
[frame 48/64]
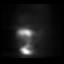
[frame 59/64]
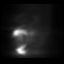

[Series 2: rest · 6.51mm/px · 6 of 64 frames shown]
[frame 6/64]
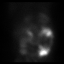
[frame 16/64]
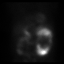
[frame 27/64]
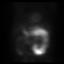
[frame 38/64]
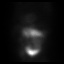
[frame 48/64]
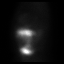
[frame 59/64]
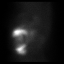

[36 of 36 positions shown; findings below may reference images not displayed]

Canned report from images found in remote index.

Refer to host system for actual result text.

## 2018-10-25 ENCOUNTER — Other Ambulatory Visit: Payer: Self-pay | Admitting: Cardiology

## 2018-10-25 DIAGNOSIS — I471 Supraventricular tachycardia: Secondary | ICD-10-CM

## 2018-10-25 MED ORDER — METOPROLOL SUCCINATE ER 25 MG PO TB24: 25.0000 mg | ORAL_TABLET | Freq: Every day | ORAL | 0 refills | Status: DC

## 2018-11-07 ENCOUNTER — Telehealth: Payer: Self-pay | Admitting: Cardiology

## 2018-11-07 NOTE — Telephone Encounter (Signed)
Will send a message to Dr. Radford [name] to see if she wants an echo before patient's virtual visit in November. Patient's last echo was 09/2017.

## 2018-11-07 NOTE — Telephone Encounter (Signed)
New Message:  Patient wanted to know if she needed an Echo before her upcoming annual visit. She will not be able to do the Echo until after 10/20 when her Grandchildren return to school. Please advise

## 2018-11-07 NOTE — Telephone Encounter (Signed)
Followup echo in 3-5 years since it was only mild

## 2018-11-07 NOTE — Telephone Encounter (Signed)
I spoke with pt and gave her information from Dr Turner.  

## 2018-12-17 NOTE — Progress Notes (Signed)
Virtual Visit via Telephone Note   This visit type was conducted due to national recommendations for restrictions regarding the COVID-19 Pandemic (e.g. social distancing) in an effort to limit this patient's exposure and mitigate transmission in our community.  Due to her co-morbid illnesses, this patient is at least at moderate risk for complications without adequate follow up.  This format is felt to be most appropriate for this patient at this time.  All issues noted in this document were discussed and addressed.  A limited physical exam was performed with this format.  Please refer to the patient's chart for her consent to telehealth for [facility].   Evaluation Performed:  Follow-up visit  This visit type was conducted due to national recommendations for restrictions regarding the COVID-19 Pandemic (e.g. social distancing).  This format is felt to be most appropriate for this patient at this time.  All issues noted in this document were discussed and addressed.  No physical exam was performed (except for noted visual exam findings with Video Visits).  Please refer to the patient's chart (MyChart message for video visits and phone note for telephone visits) for the patient's consent to telehealth for [facility].  Date:  12/18/2018   ID:  Nicole Graves, Nicole Graves [birth year], MRN [id number]  Patient Location:  Home  Provider location:   [place]  PCP:  Kathyrn Lass, MD  Cardiologist: Fransico Him, MD Electrophysiologist:  None   Chief Complaint:  Aortic stenosis  History of Present Illness:    Nicole Graves is a 73 y.o. female who presents via audio/video conferencing for a telehealth visit today.    Nicole Graves is a 73 y.o. female with a hx of mild AS, AI and SVT.   She is here today for followup and is doing well.  She has had 2 episodes of palpitations around bedtime that are short lived and infrequent and resolve with extra dose of BB.  She denies any chest pain or pressure,  SOB (except with palpitations), DOE, PND, orthopnea, LE edema, dizziness(except with palpitations) or syncope. She is compliant with her meds and is tolerating meds with no SE.    The patient does not have symptoms concerning for COVID-19 infection (fever, chills, cough, or new shortness of breath).    Prior CV studies:   The following studies were reviewed today:  none  Past Medical History:  Diagnosis Date  . Anemia   . Anginal pain ([place]) 01/2015  . Aortic stenosis    Echo 01/2016: mild LVH, vigorous LVF, no RWMA, Gr 1 DD, mild to moderate AS (mean 16 mmHg), no AI, mild LAE, PASP 42 mmHg  . Arthritis    Knees, neck, back  . Chronic lower back pain    with siatica  . Colon polyp 1999  . Depression   . Dysrhythmia   . GERD (gastroesophageal reflux disease)   . PONV (postoperative nausea and vomiting)   . SVT (supraventricular tachycardia) ([place])   . TR (tricuspid regurgitation) 09/19/2016   Past Surgical History:  Procedure Laterality Date  . [place]   X2  . CHOLECYSTECTOMY    . COSMETIC SURGERY  1980   removal extra fat inner thighs  . EYE SURGERY Bilateral     Cataract extraction with IOL  . TOTAL KNEE ARTHROPLASTY Right 11/30/2015   Procedure: RIGHT TOTAL KNEE ARTHROPLASTY;  Surgeon: Gaynelle Arabian, MD;  Location: [facility];  Service: Orthopedics;  Laterality: Right;  . TOTAL KNEE ARTHROPLASTY Left  11/14/2016   Procedure: LEFT TOTAL KNEE ARTHROPLASTY;  Surgeon: Ollen GrossAluisio, Frank, MD;  Location: [facility];  Service: Orthopedics;  Laterality: Left;     Current Meds  Medication Sig  . aspirin EC 81 MG tablet Take 81 mg by mouth daily.  Marland Kitchen. atorvastatin (LIPITOR) 10 MG tablet Take 10 mg by mouth at bedtime.   Marland Kitchen. buPROPion (WELLBUTRIN XL) 300 MG 24 hr tablet Take 900 mg by mouth daily after breakfast.   . calcium carbonate (CALCIUM 600) 600 MG TABS tablet Take 600 mg by mouth as directed.  . celecoxib (CELEBREX) 200 MG capsule Take 200 mg by mouth daily.  .  cyclobenzaprine (FLEXERIL) 10 MG tablet Take 10 mg by mouth at bedtime.  . DULoxetine (CYMBALTA) 30 MG capsule Take 30 mg by mouth daily after breakfast.   . Lurasidone HCl 60 MG TABS Take 60 mg by mouth daily at 6 PM.   . metFORMIN (GLUCOPHAGE) 500 MG tablet Take 500 mg by mouth 2 (two) times daily.  . metoprolol succinate (TOPROL-XL) 25 MG 24 hr tablet Take 1 tablet (25 mg total) by mouth at bedtime. Please make yearly appt with Dr. Mayford Knifeurner for October before anymore refills. 1st attempt  . omeprazole (PRILOSEC) 20 MG capsule Take 20 mg by mouth daily at 6 (six) AM.   . traMADol (ULTRAM) 50 MG tablet Take 1-2 tablets (50-100 mg total) by mouth every 6 (six) hours as needed for moderate pain.  Marland Kitchen. VITAMIN D PO Take 1 capsule by mouth daily.     Allergies:   Sulfa antibiotics   Social History   Tobacco Use  . Smoking status: Never Smoker  . Smokeless tobacco: Never Used  Substance Use Topics  . Alcohol use: No  . Drug use: No     Family Hx: The patient's family history includes Colon cancer in her mother; Heart attack in her brother; Hypertension in her brother. There is no history of Stroke.  ROS:   Please see the history of present illness.     All other systems reviewed and are negative.   Labs/Other Tests and Data Reviewed:    Recent Labs: No results found for requested labs within last 8760 hours.   Recent Lipid Panel No results found for: CHOL, TRIG, HDL, CHOLHDL, LDLCALC, LDLDIRECT  Wt Readings from Last 3 Encounters:  12/18/18 186 lb (84.4 kg)  11/27/17 187 lb (84.8 kg)  11/14/16 188 lb (85.3 kg)     Objective:    Vital Signs:  Ht 5\' 5"  (1.651 m)   Wt 186 lb (84.4 kg)   BMI 30.95 kg/m    ASSESSMENT & PLAN:    1.  Paroxysmal atrial tachycardia -she has not had any palpitations -continue Toprol XL 25mg  daily  2.  Aortic stenosis -2D echo 09/2017 showed normal LVF with mild AS and mean AVG 14mmHg.   -repeat echo in 1 year  3.  Orthostatic hypotension  -she has not had any dizzy spells since I saw her last.  COVID-19 Education: The signs and symptoms of COVID-19 were discussed with the patient and how to seek care for testing (follow up with PCP or arrange E-visit).  The importance of social distancing was discussed today.  Patient Risk:   After full review of this patient's clinical status, I feel that they are at least moderate risk at this time.  Time:   Today, I have spent 20 minutes directly with the patient on telemedicine discussing medical problems including PAT, AS, orthostatic hypotension.  We also reviewed the symptoms of COVID 19 and the ways to protect against contracting the virus with telehealth technology.  I spent an additional 5 minutes reviewing patient's chart including 2D echo.  Medication Adjustments/Labs and Tests Ordered: Current medicines are reviewed at length with the patient today.  Concerns regarding medicines are outlined above.  Tests Ordered: No orders of the defined types were placed in this encounter.  Medication Changes: No orders of the defined types were placed in this encounter.   Disposition:  Follow up in 1 year(s)  Signed, [name], MD  12/18/2018 10:39 AM    [facility]

## 2018-12-18 ENCOUNTER — Telehealth (INDEPENDENT_AMBULATORY_CARE_PROVIDER_SITE_OTHER): Payer: Medicare Other | Admitting: Cardiology

## 2018-12-18 ENCOUNTER — Other Ambulatory Visit: Payer: Self-pay

## 2018-12-18 VITALS — Ht 65.0 in | Wt 186.0 lb

## 2018-12-18 DIAGNOSIS — I471 Supraventricular tachycardia: Secondary | ICD-10-CM | POA: Diagnosis not present

## 2018-12-18 DIAGNOSIS — I350 Nonrheumatic aortic (valve) stenosis: Secondary | ICD-10-CM

## 2018-12-18 DIAGNOSIS — I951 Orthostatic hypotension: Secondary | ICD-10-CM | POA: Diagnosis not present

## 2018-12-18 NOTE — Patient Instructions (Signed)
Medication Instructions:   Your physician recommends that you continue on your current medications as directed. Please refer to the Current Medication list given to you today.  *If you need a refill on your cardiac medications before your next appointment, please call your pharmacy*    Follow-Up: At [facility], you and your health needs are our priority.  As part of our continuing mission to provide you with exceptional heart care, we have created designated Provider Care Teams.  These Care Teams include your primary Cardiologist (physician) and Advanced Practice Providers (APPs -  Physician Assistants and Nurse Practitioners) who all work together to provide you with the care you need, when you need it.  Your next appointment:   12 months  The format for your next appointment:   Either In Person or Virtual  Provider:   Traci Turner, MD    

## 2019-04-16 ENCOUNTER — Other Ambulatory Visit: Payer: Self-pay | Admitting: Cardiology

## 2019-04-16 DIAGNOSIS — I471 Supraventricular tachycardia: Secondary | ICD-10-CM

## 2019-11-25 DIAGNOSIS — M5030 Other cervical disc degeneration, unspecified cervical region: Secondary | ICD-10-CM | POA: Diagnosis not present

## 2019-12-16 DIAGNOSIS — M5412 Radiculopathy, cervical region: Secondary | ICD-10-CM | POA: Diagnosis not present

## 2019-12-17 NOTE — Progress Notes (Signed)
Date:  12/18/2019   ID:  Nicole Graves, DOB [birth year], MRN [id number]   PCP:  [name], MD  Cardiologist: [name], MD Electrophysiologist:  None   Chief Complaint:  Aortic stenosis  History of Present Illness:    Nicole Graves is a 74 y.o. female with a hx of mild AS, AI and SVT.  She is here today for followup and is doing well.  She denies any chest pain or pressure, PND, orthopnea, LE edema, dizziness (except is she moves too fast or has palpitations),  or syncope. She occasionally has some palpitations but they are short lived.  She has some mild SOB with the palpitations and mild DOE going up stairs which she attributes to being out of shape.  She takes her Toprol at night.  She is compliant with her meds and is tolerating meds with no SE.    Prior CV studies:   The following studies were reviewed today:  2D ECHO  Past Medical History:  Diagnosis Date  . Anemia   . Anginal pain ([facility]) 01/2015  . Aortic stenosis    Echo 01/2016: mild LVH, vigorous LVF, no RWMA, Gr 1 DD, mild to moderate AS (mean 16 mmHg), no AI, mild LAE, PASP 42 mmHg  . Arthritis    Knees, neck, back  . Chronic lower back pain    with siatica  . Colon polyp 1999  . Depression   . Dysrhythmia   . GERD (gastroesophageal reflux disease)   . PONV (postoperative nausea and vomiting)   . SVT (supraventricular tachycardia) ([facility])   . TR (tricuspid regurgitation) 09/19/2016   Past Surgical History:  Procedure Laterality Date  . CESAREAN SECTION  1964   X2  . CHOLECYSTECTOMY    . COSMETIC SURGERY  1980   removal extra fat inner thighs  . EYE SURGERY Bilateral     Cataract extraction with IOL  . TOTAL KNEE ARTHROPLASTY Right 11/30/2015   Procedure: RIGHT TOTAL KNEE ARTHROPLASTY;  Surgeon: Ollen Gross, MD;  Location: [facility];  Service: Orthopedics;  Laterality: Right;  . TOTAL KNEE ARTHROPLASTY Left 11/14/2016   Procedure: LEFT TOTAL KNEE ARTHROPLASTY;  Surgeon: Ollen Gross, MD;  Location: [facility];   Service: Orthopedics;  Laterality: Left;     Current Meds  Medication Sig  . aspirin EC 81 MG tablet Take 81 mg by mouth daily.  Marland Kitchen atorvastatin (LIPITOR) 10 MG tablet Take 10 mg by mouth at bedtime.   Marland Kitchen buPROPion (WELLBUTRIN XL) 300 MG 24 hr tablet Take 900 mg by mouth daily after breakfast.   . calcium carbonate (CALCIUM 600) 600 MG TABS tablet Take 600 mg by mouth as directed.  . cyclobenzaprine (FLEXERIL) 10 MG tablet Take 10 mg by mouth at bedtime.  . DULoxetine (CYMBALTA) 30 MG capsule Take 30 mg by mouth daily after breakfast.   . Lurasidone HCl 60 MG TABS Take 60 mg by mouth daily at 6 PM.   . metFORMIN (GLUCOPHAGE) 500 MG tablet Take 500 mg by mouth 2 (two) times daily.  . metoprolol succinate (TOPROL-XL) 25 MG 24 hr tablet TAKE 1 TABLET BY MOUTH AT BEDTIME  . traMADol (ULTRAM) 50 MG tablet Take 1-2 tablets (50-100 mg total) by mouth every 6 (six) hours as needed for moderate pain. (Patient taking differently: Take 50-100 mg by mouth every 6 (six) hours as needed for moderate pain. )  . VITAMIN D PO Take 1 capsule by mouth daily.     Allergies:  Sulfa antibiotics   Social History   Tobacco Use  . Smoking status: Never Smoker  . Smokeless tobacco: Never Used  Substance Use Topics  . Alcohol use: No  . Drug use: No     Family Hx: The patient's family history includes Colon cancer in her mother; Heart attack in her brother; Hypertension in her brother. There is no history of Stroke.  ROS:   Please see the history of present illness.     All other systems reviewed and are negative.   Labs/Other Tests and Data Reviewed:    Recent Labs: No results found for requested labs within last 8760 hours.   Recent Lipid Panel No results found for: CHOL, TRIG, HDL, CHOLHDL, LDLCALC, LDLDIRECT  Wt Readings from Last 3 Encounters:  12/18/19 185 lb (83.9 kg)  12/18/18 186 lb (84.4 kg)  11/27/17 187 lb (84.8 kg)     Objective:    Vital Signs:  BP 112/88   Pulse (!) 104    Ht 5\' 3"  (1.6 m)   Wt 185 lb (83.9 kg)   BMI 32.77 kg/m    GEN: Well nourished, well developed in no acute distress HEENT: Normal NECK: No JVD; No carotid bruits LYMPHATICS: No lymphadenopathy CARDIAC:RRR, no  rubs, gallops.  2/6 SM at RUSB to LLSB RESPIRATORY:  Clear to auscultation without rales, wheezing or rhonchi  ABDOMEN: Soft, non-tender, non-distended MUSCULOSKELETAL:  No edema; No deformity  SKIN: Warm and dry NEUROLOGIC:  Alert and oriented x 3 PSYCHIATRIC:  Normal affect    ASSESSMENT & PLAN:    1.  Paroxysmal atrial tachycardia -She occasionally notices palpitations and her HR is elevated mildly today and she took her BB last night -continue Toprol XL 25mg  qpm and add Toprol XL 25mg  qam as well to try to decrease baseline tachycardia noted on EKG today and also help with palpitations -check TSH due to resting tachycardia today  2.  Aortic stenosis -2D echo 09/2017 showed normal LVF with mild AS and mean AVG .   -repeat echo to make sure this is stable  3.  Orthostatic hypotension -she has not had any dizzy spells since I saw her last except when she has palpitations -denies any syncope   Medication Adjustments/Labs and Tests Ordered: Current medicines are reviewed at length with the patient today.  Concerns regarding medicines are outlined above.  Tests Ordered: Orders Placed This Encounter  Procedures  . EKG 12-Lead   Medication Changes: No orders of the defined types were placed in this encounter.   Disposition:  Follow up in 1 year(s)  Signed, , MD  12/18/2019 10:43 AM    [facility]

## 2019-12-18 ENCOUNTER — Ambulatory Visit (INDEPENDENT_AMBULATORY_CARE_PROVIDER_SITE_OTHER): Payer: Medicare PPO | Admitting: Cardiology

## 2019-12-18 ENCOUNTER — Encounter: Payer: Self-pay | Admitting: Cardiology

## 2019-12-18 ENCOUNTER — Other Ambulatory Visit: Payer: Self-pay

## 2019-12-18 VITALS — BP 112/88 | HR 104 | Ht 63.0 in | Wt 185.0 lb

## 2019-12-18 DIAGNOSIS — I951 Orthostatic hypotension: Secondary | ICD-10-CM

## 2019-12-18 DIAGNOSIS — I350 Nonrheumatic aortic (valve) stenosis: Secondary | ICD-10-CM | POA: Diagnosis not present

## 2019-12-18 DIAGNOSIS — I471 Supraventricular tachycardia: Secondary | ICD-10-CM

## 2019-12-18 LAB — TSH: TSH: 1.98 u[IU]/mL (ref 0.450–4.500)

## 2019-12-18 MED ORDER — METOPROLOL SUCCINATE ER 25 MG PO TB24: 25.0000 mg | ORAL_TABLET | Freq: Every day | ORAL | 3 refills | Status: DC

## 2019-12-18 MED ORDER — METOPROLOL SUCCINATE ER 25 MG PO TB24: 25.0000 mg | ORAL_TABLET | Freq: Two times a day (BID) | ORAL | 3 refills | Status: DC

## 2019-12-18 NOTE — Addendum Note (Signed)
Addended by: Theresia Majors on: 12/18/2019 11:01 AM   Modules accepted: Orders

## 2019-12-18 NOTE — Addendum Note (Signed)
Addended by: Theresia Majors on: 12/18/2019 10:59 AM   Modules accepted: Orders

## 2019-12-18 NOTE — Patient Instructions (Signed)
Medication Instructions:  Your physician has recommended you make the following change in your medication:  1) INCREASE Toprol XL to 25 mg twice daily  *If you need a refill on your cardiac medications before your next appointment, please call your pharmacy*   Lab Work: TODAY: TSH If you have labs (blood work) drawn today and your tests are completely normal, you will receive your results only by: Marland Kitchen MyChart Message (if you have MyChart) OR . A paper copy in the mail If you have any lab test that is abnormal or we need to change your treatment, we will call you to review the results.   Testing/Procedures: Your physician has requested that you have an echocardiogram. Echocardiography is a painless test that uses sound waves to create images of your heart. It provides your doctor with information about the size and shape of your heart and how well your heart's chambers and valves are working. This procedure takes approximately one hour. There are no restrictions for this procedure.  Follow-Up: At [facility], you and your health needs are our priority.  As part of our continuing mission to provide you with exceptional heart care, we have created designated Provider Care Teams.  These Care Teams include your primary Cardiologist (physician) and Advanced Practice Providers (APPs -  Physician Assistants and Nurse Practitioners) who all work together to provide you with the care you need, when you need it.  We recommend signing up for the patient portal called "MyChart".  Sign up information is provided on this After Visit Summary.  MyChart is used to connect with patients for Virtual Visits (Telemedicine).  Patients are able to view lab/test results, encounter notes, upcoming appointments, etc.  Non-urgent messages can be sent to your provider as well.   To learn more about what you can do with MyChart, go to [email or web address].    Your next appointment:   4 week(s)  The format for your  next appointment:   In Person  Provider:   You will see one of the following Advanced Practice Providers on your designated Care Team:    Ronie Spies, PA-C  Jacolyn Reedy, PA-C  Then, [name], MD will plan to see you again in 1 year(s).

## 2020-01-14 ENCOUNTER — Ambulatory Visit (HOSPITAL_COMMUNITY): Payer: Medicare PPO | Attending: Cardiology

## 2020-01-14 ENCOUNTER — Other Ambulatory Visit: Payer: Self-pay

## 2020-01-14 DIAGNOSIS — I350 Nonrheumatic aortic (valve) stenosis: Secondary | ICD-10-CM | POA: Insufficient documentation

## 2020-01-14 DIAGNOSIS — I471 Supraventricular tachycardia: Secondary | ICD-10-CM | POA: Insufficient documentation

## 2020-01-14 LAB — ECHOCARDIOGRAM COMPLETE
AR max vel: 1.09 cm2
AV Area VTI: 1.29 cm2
AV Area mean vel: 1.04 cm2
AV Mean grad: 26 mmHg
AV Peak grad: 31.1 mmHg
Ao pk vel: 2.79 m/s
Area-P 1/2: 4.8 cm2
P 1/2 time: 696 msec
S' Lateral: 2 cm

## 2020-01-15 ENCOUNTER — Encounter: Payer: Self-pay | Admitting: Cardiology

## 2020-01-16 ENCOUNTER — Encounter: Payer: Self-pay | Admitting: Physician Assistant

## 2020-01-16 NOTE — Progress Notes (Addendum)
Cardiology Office Note    Date:  01/17/2020   ID:  Nicole, Graves [birth year], MRN [id number]  PCP:  [name], MD  Cardiologist:  [name], MD  Electrophysiologist:  None   Chief Complaint: f/u DOE and HR  History of Present Illness:   Nicole Graves is a 74 y.o. female with history of moderate AS/TR, mild MR/AI, SVT/PAT, anemia, arthritis, sciatica, depression, GERD, orthostatic hypotension who is here today for follow-up.  She remotely established with Dr. [name] for palpitations and occasional chest pain. Stress test in 2013 was normal and LVEF has been normal. She also has had DOE for years. Valvular disease has been noted on more recent echoes. She also carries history of orthostatic hypotension in notes. She was seen by Dr. [name] last month noting short lived occasional palpitations as well as mild DOE which she attributed to being out of shape. At that OV her resting HR was noted to be higher so Toprol was increased. 2D echo 01/14/20 EF 65-70%, moderate LVH, grade 1 DD, normal RV, mild MR, moderate TR, mild AI, moderate AS.  She is seen back for follow-up today overall doing well.  She denies any major changes.  She has chronic dyspnea with higher levels of exertion but is able to stay busy with her family, vacuuming, and cleaning without any functional limitation.  She does not currently see her dyspnea is a problem.  She used to engage with Silver sneakers but stopped around the time the pandemic started.  She plans to try to get back into some degree of physical activity.  Her heart rate is better on the metoprolol and she is tolerating this just fine.  Labwork independently reviewed: 12/2019 TSH wnl 09/2019 LDL 80, trig 61, Cr 1.010, K 4.6, ALT 5 11/2016 K 3.9, Cr 0.74 Hgb 10.5 (checked after knee surgery)   Past Medical History:  Diagnosis Date  . Anemia   . Aortic stenosis    moderate AS with mean AVG by echo 01/2020  . Arthritis    Knees, neck, back  .  Chronic lower back pain    with siatica  . Colon polyp 1999  . Depression   . GERD (gastroesophageal reflux disease)   . Mild aortic insufficiency   . Mild mitral regurgitation   . Orthostatic hypotension   . PAT (paroxysmal atrial tachycardia) (HCC)   . PONV (postoperative nausea and vomiting)   . SVT (supraventricular tachycardia) (HCC)   . TR (tricuspid regurgitation) 09/19/2016   moderate by echo 01/2020    Past Surgical History:  Procedure Laterality Date  . CESAREAN SECTION  1964   X2  . CHOLECYSTECTOMY    . COSMETIC SURGERY  1980   removal extra fat inner thighs  . EYE SURGERY Bilateral     Cataract extraction with IOL  . TOTAL KNEE ARTHROPLASTY Right 11/30/2015   Procedure: RIGHT TOTAL KNEE ARTHROPLASTY;  Surgeon: Ollen Gross, MD;  Location: [facility];  Service: Orthopedics;  Laterality: Right;  . TOTAL KNEE ARTHROPLASTY Left 11/14/2016   Procedure: LEFT TOTAL KNEE ARTHROPLASTY;  Surgeon: Ollen Gross, MD;  Location: [facility];  Service: Orthopedics;  Laterality: Left;    Current Medications: Current Meds  Medication Sig  . aspirin EC 81 MG tablet Take 81 mg by mouth daily.  Marland Kitchen atorvastatin (LIPITOR) 10 MG tablet Take 10 mg by mouth at bedtime.   Marland Kitchen buPROPion (WELLBUTRIN XL) 300 MG 24 hr tablet Take 900 mg by mouth daily after  breakfast.   . calcium carbonate (CALCIUM 600) 600 MG TABS tablet Take 600 mg by mouth as directed.  . cyclobenzaprine (FLEXERIL) 10 MG tablet Take 10 mg by mouth at bedtime.  . DULoxetine (CYMBALTA) 30 MG capsule Take 30 mg by mouth daily after breakfast.   . gabapentin (NEURONTIN) 300 MG capsule Take 300 mg by mouth daily.   . Lurasidone HCl 60 MG TABS Take 60 mg by mouth daily at 6 PM.   . metFORMIN (GLUCOPHAGE) 500 MG tablet Take 500 mg by mouth 2 (two) times daily.  . metoprolol succinate (TOPROL XL) 25 MG 24 hr tablet Take 1 tablet (25 mg total) by mouth in the morning and at bedtime.  . traMADol (ULTRAM) 50 MG tablet Take 50 mg by mouth  every 6 (six) hours as needed for moderate pain.  Marland Kitchen. VITAMIN D PO Take 1 capsule by mouth daily.     Allergies:   Sulfa antibiotics   Social History   Socioeconomic History  . Marital status: Divorced    Spouse name: Not on file  . Number of children: Not on file  . Years of education: Not on file  . Highest education level: Not on file  Occupational History  . Not on file  Tobacco Use  . Smoking status: Never Smoker  . Smokeless tobacco: Never Used  Substance and Sexual Activity  . Alcohol use: No  . Drug use: No  . Sexual activity: Not on file  Other Topics Concern  . Not on file  Social History Narrative  . Not on file   Social Determinants of Health   Financial Resource Strain:   . Difficulty of Paying Living Expenses: Not on file  Food Insecurity:   . Worried About Programme researcher, broadcasting/film/videounning Out of Food in the Last Year: Not on file  . Ran Out of Food in the Last Year: Not on file  Transportation Needs:   . Lack of Transportation (Medical): Not on file  . Lack of Transportation (Non-Medical): Not on file  Physical Activity:   . Days of Exercise per Week: Not on file  . Minutes of Exercise per Session: Not on file  Stress:   . Feeling of Stress : Not on file  Social Connections:   . Frequency of Communication with Friends and Family: Not on file  . Frequency of Social Gatherings with Friends and Family: Not on file  . Attends Religious Services: Not on file  . Active Member of Clubs or Organizations: Not on file  . Attends BankerClub or Organization Meetings: Not on file  . Marital Status: Not on file     Family History:  The patient's family history includes Colon cancer in her mother; Heart attack in her brother; Hypertension in her brother. There is no history of Stroke.  ROS:   Please see the history of present illness.   All other systems are reviewed and otherwise negative.    EKGs/Labs/Other Studies Reviewed:    Studies reviewed are outlined and summarized above.  Reports included below if pertinent.  2D echo 01/14/20 2D echo 01/14/20 1. Since the last study on 09/25/2017 aortic stenosis progressed from mild  to moderate with mean transaortic gradient 26 mmHg.  2. Left ventricular ejection fraction, by estimation, is 65 to 70%. The  left ventricle has normal function. The left ventricle has no regional  wall motion abnormalities. There is moderate concentric left ventricular  hypertrophy. Left ventricular  diastolic parameters are consistent with Grade I diastolic dysfunction  (  impaired relaxation). Elevated left atrial pressure.  3. Right ventricular systolic function is normal. The right ventricular  size is normal. There is normal pulmonary artery systolic pressure. The  estimated right ventricular systolic pressure is 32.4 mmHg.  4. The mitral valve is normal in structure. Mild mitral valve  regurgitation. No evidence of mitral stenosis.  5. Tricuspid valve regurgitation is moderate.  6. The aortic valve is normal in structure. There is severe calcifcation  of the aortic valve. There is severe thickening of the aortic valve.  Aortic valve regurgitation is mild. Moderate aortic valve stenosis. Aortic  valve mean gradient measures 26.0  mmHg.  7. The inferior vena cava is normal in size with greater than 50%  respiratory variability, suggesting right atrial pressure of 3 mmHg.     EKG:  EKG is not ordered today  Recent Labs: 12/18/2019: TSH 1.980  Recent Lipid Panel No results found for: CHOL, TRIG, HDL, CHOLHDL, VLDL, LDLCALC, LDLDIRECT  PHYSICAL EXAM:    VS:  BP 120/82   Pulse 84   Ht 5\' 3"  (1.6 m)   Wt 187 lb 3.2 oz (84.9 kg)   SpO2 95%   BMI 33.16 kg/m   BMI: Body mass index is 33.16 kg/m.  GEN: Well nourished, well developed WF, in no acute distress HEENT: normocephalic, atraumatic Neck: no JVD, carotid bruits, or masses Cardiac: RRR; 2/6 SEM RUSB, also LLSB, no rubs or gallops, no edema  Respiratory:  clear to  auscultation bilaterally, normal work of breathing GI: soft, nontender, nondistended, + BS MS: no deformity or atrophy Skin: warm and dry, no rash Neuro:  Alert and Oriented x 3, Strength and sensation are intact, follows commands Psych: euthymic mood, full affect  Wt Readings from Last 3 Encounters:  01/17/20 187 lb 3.2 oz (84.9 kg)  12/18/19 185 lb (83.9 kg)  12/18/18 186 lb (84.4 kg)     ASSESSMENT & PLAN:   1. DOE - she feels this is proportional to her level of regular physical activity and does not see this as a problem currently.  Her echo showed normal LV function and valvular heart disease as above which we will continue to monitor.  She is not having any symptoms consistent with angina.  She will gradually begin to increase her activity in a regular physical exercise program.  We did discuss the importance of monitoring for any progression of dyspnea or lack of improvement with regular exercise.  She will let 13/03/20 know if she sees any changes.  She states she feels exactly the same way she has for years.  2. Recent sinus tachycardia - improved with increase in metoprolol.  Recent TSH was normal.  I will get a follow-up CBC and BMET today to ensure no other metabolic abnormalities were contributing to this. Prior anemia noted on labs in 2018 around time of knee surgery but do not see f/u value on KPN or Epic since then.  3. PAT - quiescent on metoprolol.  4. Moderate aortic stenosis - we discussed these findings in detail today. We will plan to obtain a follow-up echocardiogram in approximately 1 year prior to office visit with Dr. 2019.  We discussed warning symptoms of progression of aortic valvular disease. Lipids are managed by primary care. Also with additional valvular heart disease including mild MR/AI and moderate TR - anticipate these findings will be monitored on serial echocardiogram as above.  Disposition: F/u with Dr. [name] in 1 year.   Medication Adjustments/Labs and  Tests Ordered:  Current medicines are reviewed at length with the patient today.  Concerns regarding medicines are outlined above. Medication changes, Labs and Tests ordered today are summarized above and listed in the Patient Instructions accessible in Encounters.   Signed, Laurann Montana, PA-C  01/17/2020 11:28 AM    [facility] [address] [place], [place], [place]  [address] Phone: [phone]; Fax: [phone]

## 2020-01-17 ENCOUNTER — Other Ambulatory Visit: Payer: Self-pay

## 2020-01-17 ENCOUNTER — Ambulatory Visit: Payer: Medicare PPO | Admitting: Physician Assistant

## 2020-01-17 ENCOUNTER — Encounter: Payer: Self-pay | Admitting: Physician Assistant

## 2020-01-17 VITALS — BP 120/82 | HR 84 | Ht 63.0 in | Wt 187.2 lb

## 2020-01-17 DIAGNOSIS — I471 Supraventricular tachycardia: Secondary | ICD-10-CM

## 2020-01-17 DIAGNOSIS — R0609 Other forms of dyspnea: Secondary | ICD-10-CM

## 2020-01-17 DIAGNOSIS — I38 Endocarditis, valve unspecified: Secondary | ICD-10-CM | POA: Diagnosis not present

## 2020-01-17 DIAGNOSIS — R000 Tachycardia, unspecified: Secondary | ICD-10-CM | POA: Diagnosis not present

## 2020-01-17 DIAGNOSIS — R0600 Dyspnea, unspecified: Secondary | ICD-10-CM

## 2020-01-17 DIAGNOSIS — I350 Nonrheumatic aortic (valve) stenosis: Secondary | ICD-10-CM | POA: Diagnosis not present

## 2020-01-17 LAB — CBC
Hematocrit: 36.2 % (ref 34.0–46.6)
Hemoglobin: 11.7 g/dL (ref 11.1–15.9)
MCH: 29.3 pg (ref 26.6–33.0)
MCHC: 32.3 g/dL (ref 31.5–35.7)
MCV: 91 fL (ref 79–97)
Platelets: 398 10*3/uL (ref 150–450)
RBC: 3.99 x10E6/uL (ref 3.77–5.28)
RDW: 12.1 % (ref 11.7–15.4)
WBC: 7.7 10*3/uL (ref 3.4–10.8)

## 2020-01-17 LAB — BASIC METABOLIC PANEL
BUN/Creatinine Ratio: 14 (ref 12–28)
BUN: 17 mg/dL (ref 8–27)
CO2: 25 mmol/L (ref 20–29)
Calcium: 10.3 mg/dL (ref 8.7–10.3)
Chloride: 102 mmol/L (ref 96–106)
Creatinine, Ser: 1.22 mg/dL — ABNORMAL HIGH (ref 0.57–1.00)
GFR calc Af Amer: 50 mL/min/{1.73_m2} — ABNORMAL LOW (ref >59)
GFR calc non Af Amer: 43 mL/min/{1.73_m2} — ABNORMAL LOW (ref >59)
Glucose: 103 mg/dL — ABNORMAL HIGH (ref 65–99)
Potassium: 4.8 mmol/L (ref 3.5–5.2)
Sodium: 139 mmol/L (ref 134–144)

## 2020-01-17 MED ORDER — METOPROLOL SUCCINATE ER 25 MG PO TB24: 25.0000 mg | ORAL_TABLET | Freq: Two times a day (BID) | ORAL | 3 refills | Status: DC

## 2020-01-17 NOTE — Addendum Note (Signed)
Addended by: Laurann Montana on: 01/17/2020 12:16 PM   Modules accepted: Level of Service

## 2020-01-17 NOTE — Addendum Note (Signed)
Addended by: Burnetta Sabin on: 01/17/2020 12:21 PM   Modules accepted: Orders

## 2020-01-17 NOTE — Patient Instructions (Addendum)
Medication Instructions:  Your physician recommends that you continue on your current medications as directed. Please refer to the Current Medication list given to you today.  *If you need a refill on your cardiac medications before your next appointment, please call your pharmacy*   Lab Work: TODAY:  BMET & CBC  If you have labs (blood work) drawn today and your tests are completely normal, you will receive your results only by: Marland Kitchen MyChart Message (if you have MyChart) OR . A paper copy in the mail If you have any lab test that is abnormal or we need to change your treatment, we will call you to review the results.   Testing/Procedures: Your physician has requested that you have an echocardiogram in 1 year before seeing Dr. [name].   Echocardiography is a painless test that uses sound waves to create images of your heart. It provides your doctor with information about the size and shape of your heart and how well your heart's chambers and valves are working. This procedure takes approximately one hour. There are no restrictions for this procedure.     Follow-Up: At [facility], you and your health needs are our priority.  As part of our continuing mission to provide you with exceptional heart care, we have created designated Provider Care Teams.  These Care Teams include your primary Cardiologist (physician) and Advanced Practice Providers (APPs -  Physician Assistants and Nurse Practitioners) who all work together to provide you with the care you need, when you need it.  We recommend signing up for the patient portal called "MyChart".  Sign up information is provided on this After Visit Summary.  MyChart is used to connect with patients for Virtual Visits (Telemedicine).  Patients are able to view lab/test results, encounter notes, upcoming appointments, etc.  Non-urgent messages can be sent to your provider as well.   To learn more about what you can do with MyChart, go to  [email or web address].    Your next appointment:   12 month(s)  The format for your next appointment:   In Person  Provider:   You may see [name], MD or one of the following Advanced Practice Providers on your designated Care Team:    Ronie Spies, PA-C  Jacolyn Reedy, PA-C    Other Instructions

## 2020-01-18 DIAGNOSIS — M5030 Other cervical disc degeneration, unspecified cervical region: Secondary | ICD-10-CM | POA: Diagnosis not present

## 2020-02-21 DIAGNOSIS — M5412 Radiculopathy, cervical region: Secondary | ICD-10-CM | POA: Diagnosis not present

## 2020-02-28 DIAGNOSIS — N183 Chronic kidney disease, stage 3 unspecified: Secondary | ICD-10-CM | POA: Diagnosis not present

## 2020-03-03 ENCOUNTER — Other Ambulatory Visit: Payer: Self-pay | Admitting: Family Medicine

## 2020-03-03 DIAGNOSIS — N183 Chronic kidney disease, stage 3 unspecified: Secondary | ICD-10-CM

## 2020-03-24 ENCOUNTER — Ambulatory Visit
Admission: RE | Admit: 2020-03-24 | Discharge: 2020-03-24 | Disposition: A | Discharge status: Home / Self Care | Payer: Medicare PPO | Source: Ambulatory Visit | Attending: Family Medicine | Admitting: Family Medicine

## 2020-03-24 DIAGNOSIS — N183 Chronic kidney disease, stage 3 unspecified: Secondary | ICD-10-CM

## 2020-03-24 DIAGNOSIS — N189 Chronic kidney disease, unspecified: Secondary | ICD-10-CM | POA: Diagnosis not present

## 2020-03-29 ENCOUNTER — Other Ambulatory Visit: Payer: Self-pay | Admitting: Cardiology

## 2020-04-01 DIAGNOSIS — E6609 Other obesity due to excess calories: Secondary | ICD-10-CM | POA: Diagnosis not present

## 2020-04-01 DIAGNOSIS — F325 Major depressive disorder, single episode, in full remission: Secondary | ICD-10-CM | POA: Diagnosis not present

## 2020-04-01 DIAGNOSIS — N183 Chronic kidney disease, stage 3 unspecified: Secondary | ICD-10-CM | POA: Diagnosis not present

## 2020-04-01 DIAGNOSIS — K219 Gastro-esophageal reflux disease without esophagitis: Secondary | ICD-10-CM | POA: Diagnosis not present

## 2020-04-01 DIAGNOSIS — I350 Nonrheumatic aortic (valve) stenosis: Secondary | ICD-10-CM | POA: Diagnosis not present

## 2020-04-01 DIAGNOSIS — I471 Supraventricular tachycardia: Secondary | ICD-10-CM | POA: Diagnosis not present

## 2020-04-01 DIAGNOSIS — Z6832 Body mass index (BMI) 32.0-32.9, adult: Secondary | ICD-10-CM | POA: Diagnosis not present

## 2020-04-15 DIAGNOSIS — M5412 Radiculopathy, cervical region: Secondary | ICD-10-CM | POA: Diagnosis not present

## 2020-04-22 DIAGNOSIS — F332 Major depressive disorder, recurrent severe without psychotic features: Secondary | ICD-10-CM | POA: Diagnosis not present

## 2020-05-23 DIAGNOSIS — M5412 Radiculopathy, cervical region: Secondary | ICD-10-CM | POA: Diagnosis not present

## 2020-06-09 DIAGNOSIS — M542 Cervicalgia: Secondary | ICD-10-CM | POA: Diagnosis not present

## 2020-07-14 DIAGNOSIS — Z1231 Encounter for screening mammogram for malignant neoplasm of breast: Secondary | ICD-10-CM | POA: Diagnosis not present

## 2020-07-28 DIAGNOSIS — M5412 Radiculopathy, cervical region: Secondary | ICD-10-CM | POA: Diagnosis not present

## 2020-08-24 DIAGNOSIS — M5412 Radiculopathy, cervical region: Secondary | ICD-10-CM | POA: Diagnosis not present

## 2020-10-21 DIAGNOSIS — F332 Major depressive disorder, recurrent severe without psychotic features: Secondary | ICD-10-CM | POA: Diagnosis not present

## 2020-11-25 DIAGNOSIS — M810 Age-related osteoporosis without current pathological fracture: Secondary | ICD-10-CM | POA: Diagnosis not present

## 2020-11-25 DIAGNOSIS — Z23 Encounter for immunization: Secondary | ICD-10-CM | POA: Diagnosis not present

## 2020-11-25 DIAGNOSIS — E7800 Pure hypercholesterolemia, unspecified: Secondary | ICD-10-CM | POA: Diagnosis not present

## 2020-11-25 DIAGNOSIS — M791 Myalgia, unspecified site: Secondary | ICD-10-CM | POA: Diagnosis not present

## 2020-11-25 DIAGNOSIS — I471 Supraventricular tachycardia: Secondary | ICD-10-CM | POA: Diagnosis not present

## 2020-11-25 DIAGNOSIS — N183 Chronic kidney disease, stage 3 unspecified: Secondary | ICD-10-CM | POA: Diagnosis not present

## 2020-11-25 DIAGNOSIS — E538 Deficiency of other specified B group vitamins: Secondary | ICD-10-CM | POA: Diagnosis not present

## 2020-11-25 DIAGNOSIS — F325 Major depressive disorder, single episode, in full remission: Secondary | ICD-10-CM | POA: Diagnosis not present

## 2020-11-25 DIAGNOSIS — R109 Unspecified abdominal pain: Secondary | ICD-10-CM | POA: Diagnosis not present

## 2020-11-25 DIAGNOSIS — D649 Anemia, unspecified: Secondary | ICD-10-CM | POA: Diagnosis not present

## 2020-11-25 DIAGNOSIS — I350 Nonrheumatic aortic (valve) stenosis: Secondary | ICD-10-CM | POA: Diagnosis not present

## 2020-11-25 DIAGNOSIS — E6609 Other obesity due to excess calories: Secondary | ICD-10-CM | POA: Diagnosis not present

## 2020-12-03 ENCOUNTER — Other Ambulatory Visit: Payer: Self-pay

## 2020-12-03 ENCOUNTER — Ambulatory Visit (HOSPITAL_COMMUNITY): Payer: Medicare PPO | Attending: Internal Medicine

## 2020-12-03 DIAGNOSIS — I471 Supraventricular tachycardia: Secondary | ICD-10-CM | POA: Diagnosis not present

## 2020-12-03 DIAGNOSIS — R0609 Other forms of dyspnea: Secondary | ICD-10-CM | POA: Insufficient documentation

## 2020-12-03 DIAGNOSIS — I350 Nonrheumatic aortic (valve) stenosis: Secondary | ICD-10-CM | POA: Diagnosis not present

## 2020-12-03 DIAGNOSIS — R000 Tachycardia, unspecified: Secondary | ICD-10-CM

## 2020-12-03 DIAGNOSIS — I38 Endocarditis, valve unspecified: Secondary | ICD-10-CM | POA: Diagnosis not present

## 2020-12-03 LAB — ECHOCARDIOGRAM COMPLETE
AR max vel: 0.78 cm2
AV Area VTI: 0.71 cm2
AV Area mean vel: 0.71 cm2
AV Mean grad: 25 mmHg
AV Peak grad: 39.4 mmHg
Ao pk vel: 3.14 m/s
Area-P 1/2: 3.65 cm2
P 1/2 time: 439 msec
S' Lateral: 2.3 cm

## 2020-12-04 DIAGNOSIS — Z1389 Encounter for screening for other disorder: Secondary | ICD-10-CM | POA: Diagnosis not present

## 2020-12-04 DIAGNOSIS — Z0000 Encounter for general adult medical examination without abnormal findings: Secondary | ICD-10-CM | POA: Diagnosis not present

## 2020-12-07 DIAGNOSIS — D649 Anemia, unspecified: Secondary | ICD-10-CM | POA: Diagnosis not present

## 2021-01-13 ENCOUNTER — Ambulatory Visit: Payer: Medicare PPO | Admitting: Cardiology

## 2021-01-13 ENCOUNTER — Encounter: Payer: Self-pay | Admitting: Cardiology

## 2021-01-13 ENCOUNTER — Other Ambulatory Visit: Payer: Self-pay

## 2021-01-13 VITALS — BP 130/74 | HR 79 | Ht 63.0 in | Wt 181.4 lb

## 2021-01-13 DIAGNOSIS — I350 Nonrheumatic aortic (valve) stenosis: Secondary | ICD-10-CM | POA: Diagnosis not present

## 2021-01-13 DIAGNOSIS — I471 Supraventricular tachycardia: Secondary | ICD-10-CM | POA: Diagnosis not present

## 2021-01-13 DIAGNOSIS — I951 Orthostatic hypotension: Secondary | ICD-10-CM | POA: Diagnosis not present

## 2021-01-13 NOTE — Progress Notes (Signed)
Date:  01/13/2021   ID:  Nicole Graves, DOB July 24, 1945, MRN [id number]   PCP:  Sigmund HazelMiller, Lisa, MD  Cardiologist: Armanda Magicraci Suhayb Anzalone, MD Electrophysiologist:  None   Chief Complaint:  Aortic stenosis  History of Present Illness:    Nicole Graves is a 75 y.o. female with a hx of AS, AI and SVT.  2D echo 12/03/2020 showed normal LV function with moderate LVH, mild MR and moderate to severe aortic valve stenosis with mean aortic valve gradient 25 mmHg, DI 0.25 and stroke-volume index reduced to 28 consistent with low-flow low gradient moderate to severe aortic stenosis in the setting of normal LV function.  She is here today for followup and is doing well.  She denies any chest pain or pressure, PND, orthopnea, LE edema, dizziness or syncope. Occasionally she will feel her heart beat race for a few minutes and resolves.  She has had chronic DOE that she thinks is stable.  She has tripped several times but not due to dizziness or lightheadedness.  She is compliant with her meds and is tolerating meds with no SE.     Prior CV studies:   The following studies were reviewed today:  2D ECHO  Past Medical History:  Diagnosis Date   Anemia    Aortic stenosis    moderate AS with mean AVG 26mmHg by echo 01/2020   Arthritis    Knees, neck, back   Chronic lower back pain    with siatica   Colon polyp 1999   Depression    GERD (gastroesophageal reflux disease)    Mild aortic insufficiency    Mild mitral regurgitation    Orthostatic hypotension    PAT (paroxysmal atrial tachycardia) (HCC)    PONV (postoperative nausea and vomiting)    SVT (supraventricular tachycardia) (HCC)    TR (tricuspid regurgitation) 09/19/2016   moderate by echo 01/2020   Past Surgical History:  Procedure Laterality Date   CESAREAN SECTION  1964   X2   CHOLECYSTECTOMY     COSMETIC SURGERY  1980   removal extra fat inner thighs   EYE SURGERY Bilateral     Cataract extraction with IOL   TOTAL KNEE ARTHROPLASTY Right  11/30/2015   Procedure: RIGHT TOTAL KNEE ARTHROPLASTY;  Surgeon: Ollen GrossFrank Aluisio, MD;  Location: [facility];  Service: Orthopedics;  Laterality: Right;   TOTAL KNEE ARTHROPLASTY Left 11/14/2016   Procedure: LEFT TOTAL KNEE ARTHROPLASTY;  Surgeon: Ollen GrossAluisio, Frank, MD;  Location: [facility];  Service: Orthopedics;  Laterality: Left;     Current Meds  Medication Sig   acetaminophen (TYLENOL) 650 MG CR tablet Take 2 tablets by mouth every 8 (eight) hours as needed.   alendronate (FOSAMAX) 70 MG tablet Take 1 tablet by mouth once a week.   aspirin EC 81 MG tablet Take 81 mg by mouth daily.   atorvastatin (LIPITOR) 10 MG tablet Take 10 mg by mouth at bedtime.    benztropine (COGENTIN) 0.5 MG tablet Take 1 tablet by mouth 2 (two) times daily.   buPROPion (WELLBUTRIN XL) 300 MG 24 hr tablet Take 900 mg by mouth daily after breakfast.    calcium carbonate (OS-CAL) 600 MG TABS tablet Take 600 mg by mouth as directed.   Cholecalciferol (VITAMIN D3) 50 MCG (2000 UT) CAPS Take 2 capsules by mouth daily.   cyclobenzaprine (FLEXERIL) 10 MG tablet Take 10 mg by mouth at bedtime.   DULoxetine (CYMBALTA) 30 MG capsule Take 30 mg by mouth daily after breakfast.  gabapentin (NEURONTIN) 300 MG capsule Take 300 mg by mouth daily.    Iron, Ferrous Sulfate, 325 (65 Fe) MG TABS Take 1 tablet by mouth daily.   Lurasidone HCl 60 MG TABS Take 60 mg by mouth daily at 6 PM.    metFORMIN (GLUCOPHAGE) 500 MG tablet Take 500 mg by mouth 2 (two) times daily.   metoprolol succinate (TOPROL XL) 25 MG 24 hr tablet Take 1 tablet (25 mg total) by mouth in the morning and at bedtime.   traMADol (ULTRAM) 50 MG tablet Take 50 mg by mouth every 6 (six) hours as needed for moderate pain.   VITAMIN D PO Take 1 capsule by mouth daily.     Allergies:   Sulfa antibiotics and Tizanidine hcl   Social History   Tobacco Use   Smoking status: Never   Smokeless tobacco: Never  Substance Use Topics   Alcohol use: No   Drug use: No     Family  Hx: The patient's family history includes Colon cancer in her mother; Heart attack in her brother; Hypertension in her brother. There is no history of Stroke.  ROS:   Please see the history of present illness.     All other systems reviewed and are negative.   Labs/Other Tests and Data Reviewed:    Recent Labs: 01/17/2020: BUN 17; Creatinine, Ser 1.22; Hemoglobin 11.7; Platelets 398; Potassium 4.8; Sodium 139   Recent Lipid Panel No results found for: CHOL, TRIG, HDL, CHOLHDL, LDLCALC, LDLDIRECT  Wt Readings from Last 3 Encounters:  01/13/21 181 lb 6.4 oz (82.3 kg)  01/17/20 187 lb 3.2 oz (84.9 kg)  12/18/19 185 lb (83.9 kg)     Objective:    Vital Signs:  BP 130/74 (BP Location: Left Arm, Patient Position: Sitting, Cuff Size: Normal)   Pulse 79   Ht 5\' 3"  (1.6 m)   Wt 181 lb 6.4 oz (82.3 kg)   SpO2 99%   BMI 32.13 kg/m    GEN: Well nourished, well developed in no acute distress HEENT: Normal NECK: No JVD; No carotid bruits LYMPHATICS: No lymphadenopathy CARDIAC:RRR, no rubs, gallops.  2/6 SM at RUSB RESPIRATORY:  Clear to auscultation without rales, wheezing or rhonchi  ABDOMEN: Soft, non-tender, non-distended MUSCULOSKELETAL:  No edema; No deformity  SKIN: Warm and dry NEUROLOGIC:  Alert and oriented x 3 PSYCHIATRIC:  Normal affect   ASSESSMENT & PLAN:    1.  Paroxysmal atrial tachycardia -She is maintaining normal sinus rhythm on exam and has not had any prolonged episodes of tachycardia.  Occasionally her heart will race for a few minutes to seconds -Continue prescription drug management with Toprol-XL 25 mg twice daily  2.  Aortic stenosis -2D echo 09/2017 showed normal LVF with mild AS and mean AVG 10/2017.   -2D echo 12/03/2020 showed normal LV function with moderate LVH, mild MR and moderate to severe aortic valve stenosis with mean aortic valve gradient 25 mmHg, DI 0.25 and stroke-volume index reduced to 28 consistent with low-flow low gradient moderate to  severe aortic stenosis in the setting of normal LV function -She is completely asymptomatic -I am going to go ahead and refer her to structural heart clinic.  She is not likely yet at the point of needing TAVR but will need close following.  3.  Orthostatic hypotension -She has not had any further dizzy spells or syncope since I saw her last    Medication Adjustments/Labs and Tests Ordered: Current medicines are reviewed at length with  the patient today.  Concerns regarding medicines are outlined above.  Tests Ordered: Orders Placed This Encounter  Procedures   EKG 12-Lead    Medication Changes: No orders of the defined types were placed in this encounter.   Disposition:  Follow up in 1 year(s)  Signed, Fransico Him, MD  01/13/2021 11:02 AM    [place]

## 2021-01-13 NOTE — Addendum Note (Signed)
Addended by: Theresia Majors on: 01/13/2021 11:11 AM   Modules accepted: Orders

## 2021-01-13 NOTE — Patient Instructions (Signed)
Medication Instructions:  Your physician recommends that you continue on your current medications as directed. Please refer to the Current Medication list given to you today.  *If you need a refill on your cardiac medications before your next appointment, please call your pharmacy*   Follow-Up: At [facility], you and your health needs are our priority.  As part of our continuing mission to provide you with exceptional heart care, we have created designated Provider Care Teams.  These Care Teams include your primary Cardiologist (physician) and Advanced Practice Providers (APPs -  Physician Assistants and Nurse Practitioners) who all work together to provide you with the care you need, when you need it.  You have been referred to the Structural Heart Team  Your next appointment:   6 month(s)  The format for your next appointment:   In Person  Provider:   [name], MD

## 2021-01-14 DIAGNOSIS — M5412 Radiculopathy, cervical region: Secondary | ICD-10-CM | POA: Diagnosis not present

## 2021-01-18 ENCOUNTER — Other Ambulatory Visit: Payer: Self-pay

## 2021-01-18 ENCOUNTER — Encounter: Payer: Self-pay | Admitting: Internal Medicine

## 2021-01-18 ENCOUNTER — Ambulatory Visit: Payer: Medicare PPO | Admitting: Internal Medicine

## 2021-01-18 ENCOUNTER — Other Ambulatory Visit: Payer: Self-pay | Admitting: Internal Medicine

## 2021-01-18 VITALS — BP 114/82 | HR 69 | Ht 63.0 in | Wt 180.0 lb

## 2021-01-18 DIAGNOSIS — I350 Nonrheumatic aortic (valve) stenosis: Secondary | ICD-10-CM | POA: Diagnosis not present

## 2021-01-18 MED ORDER — SODIUM CHLORIDE 0.9% FLUSH: 3.0000 mL | Freq: Two times a day (BID) | INTRAVENOUS | Status: DC

## 2021-01-18 NOTE — Progress Notes (Signed)
Pre Surgical Assessment: 5 M Walk Test  96M=16.47ft  5 Meter Walk Test- trial 1: 7.35 seconds 5 Meter Walk Test- trial 2: 6.96 seconds 5 Meter Walk Test- trial 3: 7.50 seconds 5 Meter Walk Test Average: 7.27 seconds

## 2021-01-18 NOTE — Patient Instructions (Addendum)
Medication Instructions:  Your physician recommends that you continue on your current medications as directed. Please refer to the Current Medication list given to you today.  *If you need a refill on your cardiac medications before your next appointment, please call your pharmacy*   Lab Work: None ordered today   If you have labs (blood work) drawn today and your tests are completely normal, you will receive your results only by: MyChart Message (if you have MyChart) OR A paper copy in the mail If you have any lab test that is abnormal or we need to change your treatment, we will call you to review the results.   Testing/Procedures: Your physician has requested that you have a cardiac catheterization. Cardiac catheterization is used to diagnose and/or treat various heart conditions. Doctors may recommend this procedure for a number of different reasons. The most common reason is to evaluate chest pain. Chest pain can be a symptom of coronary artery disease (CAD), and cardiac catheterization can show whether plaque is narrowing or blocking your heart's arteries. This procedure is also used to evaluate the valves, as well as measure the blood flow and oxygen levels in different parts of your heart. For further information please visit [email or web address] Please follow instruction sheet, as given.    Follow-Up: Follow up as scheduled    Other Instructions  [facility] CARDIOVASCULAR DIVISION [facility] [address] [place], [address] [place] [address] Dept: [phone] Loc: [phone]  DEONNA KRUMMEL  01/18/2021  You are scheduled for a Cardiac Catheterization on Wednesday, January 4 with Dr. Alverda Skeans.  1. Please arrive at the [facility] (Main Entrance A) at [facility]: [address] [place], [place] [address] at 6:30 AM (This time is two hours before your procedure to ensure your preparation). Free valet parking  service is available.   Special note: Every effort is made to have your procedure done on time. Please understand that emergencies sometimes delay scheduled procedures.  2. Diet: Do not eat solid foods after midnight.  The patient may have clear liquids until 5am upon the day of the procedure.  3. Labs: You will need to have blood drawn on Friday, December 30 at [facility]. [address], [place]  Open: 7:30am - 5pm    Phone: [phone]. You do not need to be fasting.  4. Medication instructions in preparation for your procedure:   Do not take Diabetes Med Glucophage (Metformin) on the day of the procedure and HOLD 48 HOURS AFTER THE PROCEDURE.  On the morning of your procedure, take your Aspirin and any morning medicines NOT listed above.  You may use sips of water.  5. Plan for one night stay--bring personal belongings. 6. Bring a current list of your medications and current insurance cards. 7. You MUST have a responsible person to drive you home. 8. Someone MUST be with you the first 24 hours after you arrive home or your discharge will be delayed. 9. Please wear clothes that are easy to get on and off and wear slip-on shoes.  Thank you for allowing [place] to care for you!   -- [facility]

## 2021-01-18 NOTE — Progress Notes (Signed)
Patient ID: Nicole Graves MRN: [id number] DOB/AGE: Nov 10, 1945 75 y.o.  Primary Care Physician:Miller, Lattie Haw, MD Primary Cardiologist: Fransico Him, MD   PROBLEM LIST: 1.  Paradoxical low-flow low gradient aortic stenosis with an aortic valve area of 0.71 cm and a mean gradient of 25 mmHg with normal ejection fraction; no conduction abnormalities 2.  Paroxysmal atrial tachycardia   HISTORY OF PRESENT ILLNESS: The patient is a 75 y.o. year old female with the indicated medical issues referred by Dr. Radford [name] for recommendations regarding the patient's aortic valvular disease.  The patient had been seen recently and there was noted to be progression of her aortic valvular disease on a recent echocardiogram.  The patient tells me that she has had murmur for several years.  She believes she was told she had a murmur in her 30s.  She tells me that she has been short of breath chronically for some time but has been worse in the last 6 months.  She does have vertigo but has had some dizziness when going from sitting to standing and is not associated with vertigo.  Is having maybe twice in the last few months.  She has not hit her head.  She denies any exertional angina, signs or symptoms of stroke, paroxysmal nocturnal dyspnea or orthopnea.  She has had no severe bleeding or bruising.  She has required no emergency room visits or hospitalizations.  Her dental health is good and she sees a Pharmacist, community every 6 months.  She tells me that she and her family went on a cruise last year.  She was not able to do any of the excursions due to shortness of breath.  Over the last several years she has noticed that her activity level has been diminishing.  Showering or bathing makes her short of breath.  She was short of breath walking from her car into the clinic today.  Past Medical History:  Diagnosis Date   Anemia    Aortic stenosis    moderate AS with mean AVG 74mmHg by echo 01/2020   Arthritis    Knees,  neck, back   Chronic lower back pain    with siatica   Colon polyp 1999   Depression    GERD (gastroesophageal reflux disease)    Mild aortic insufficiency    Mild mitral regurgitation    Orthostatic hypotension    PAT (paroxysmal atrial tachycardia) (HCC)    PONV (postoperative nausea and vomiting)    SVT (supraventricular tachycardia) (HCC)    TR (tricuspid regurgitation) 09/19/2016   moderate by echo 01/2020    Past Surgical History:  Procedure Laterality Date   CESAREAN SECTION  1964   [place]   removal extra fat inner thighs   EYE SURGERY Bilateral     Cataract extraction with IOL   TOTAL KNEE ARTHROPLASTY Right 11/30/2015   Procedure: RIGHT TOTAL KNEE ARTHROPLASTY;  Surgeon: Gaynelle Arabian, MD;  Location: [facility];  Service: Orthopedics;  Laterality: Right;   TOTAL KNEE ARTHROPLASTY Left 11/14/2016   Procedure: LEFT TOTAL KNEE ARTHROPLASTY;  Surgeon: Gaynelle Arabian, MD;  Location: [facility];  Service: Orthopedics;  Laterality: Left;    Family History  Problem Relation Age of Onset   Colon cancer Mother    Heart attack Brother    Hypertension Brother    Stroke Neg Hx     Social History   Socioeconomic History   Marital status: Divorced  Spouse name: Not on file   Number of children: Not on file   Years of education: Not on file   Highest education level: Not on file  Occupational History   Not on file  Tobacco Use   Smoking status: Never   Smokeless tobacco: Never  Substance and Sexual Activity   Alcohol use: No   Drug use: No   Sexual activity: Not on file  Other Topics Concern   Not on file  Social History Narrative   Not on file   Social Determinants of Health   Financial Resource Strain: Not on file  Food Insecurity: Not on file  Transportation Needs: Not on file  Physical Activity: Not on file  Stress: Not on file  Social Connections: Not on file  Intimate Partner Violence: Not on file     Prior to  Admission medications   Medication Sig Start Date End Date Taking? Authorizing Provider  acetaminophen (TYLENOL) 650 MG CR tablet Take 2 tablets by mouth every 8 (eight) hours as needed.    [provider]  alendronate (FOSAMAX) 70 MG tablet Take 1 tablet by mouth once a week.    [provider]  aspirin EC 81 MG tablet Take 81 mg by mouth daily.    [provider]  atorvastatin (LIPITOR) 10 MG tablet Take 10 mg by mouth at bedtime.  02/11/15   [provider]  benztropine (COGENTIN) 0.5 MG tablet Take 1 tablet by mouth 2 (two) times daily.    [provider]  buPROPion (WELLBUTRIN XL) 300 MG 24 hr tablet Take 900 mg by mouth daily after breakfast.     [provider]  calcium carbonate (OS-CAL) 600 MG TABS tablet Take 600 mg by mouth as directed.    [provider]  Cholecalciferol (VITAMIN D3) 50 MCG (2000 UT) CAPS Take 2 capsules by mouth daily. 02/09/16   [provider]  cyclobenzaprine (FLEXERIL) 10 MG tablet Take 10 mg by mouth at bedtime.    [provider]  DULoxetine (CYMBALTA) 30 MG capsule Take 30 mg by mouth daily after breakfast.     [provider]  gabapentin (NEURONTIN) 300 MG capsule Take 300 mg by mouth daily.  12/20/19   [provider]  Iron, Ferrous Sulfate, 325 (65 Fe) MG TABS Take 1 tablet by mouth daily. 12/16/20   [provider]  Lurasidone HCl 60 MG TABS Take 60 mg by mouth daily at 6 PM.     [provider]  metFORMIN (GLUCOPHAGE) 500 MG tablet Take 500 mg by mouth 2 (two) times daily. 11/04/17   [provider]  metoprolol succinate (TOPROL XL) 25 MG 24 hr tablet Take 1 tablet (25 mg total) by mouth in the morning and at bedtime. 01/17/20   Dunn, Nedra Hai, PA-C  traMADol (ULTRAM) 50 MG tablet Take 50 mg by mouth every 6 (six) hours as needed for moderate pain.    [provider]  VITAMIN D PO Take 1 capsule by mouth daily.    [provider]    Allergies  Allergen Reactions   Sulfa Antibiotics Nausea And Vomiting and Other (See Comments)   Tizanidine Hcl     Other reaction(s): Unknown    REVIEW OF SYSTEMS:  General: no fevers/chills/night sweats Eyes: no blurry vision, diplopia, or amaurosis ENT: no sore throat or hearing loss Resp: no cough, wheezing, or hemoptysis CV: no edema or palpitations GI: no abdominal pain, nausea, vomiting, diarrhea, or constipation  GU: no dysuria, frequency, or hematuria Skin: no rash Neuro: no headache, numbness, tingling, or weakness of extremities Musculoskeletal: no joint pain or swelling Heme: no bleeding, DVT, or easy bruising Endo: no polydipsia or polyuria  BP 114/82   Pulse 69   Ht 5\' 3"  (1.6 m)   Wt 180 lb (81.6 kg)   SpO2 99%   BMI 31.89 kg/m   PHYSICAL EXAM: GEN:  AO x 3 in no acute distress HEENT: normal Dentition: Normal Neck: JVP normal. +1 carotid upstrokes without bruits. No thyromegaly. Lungs: equal expansion, clear bilaterally CV: Apex is discrete and nondisplaced, RRR with 3/6 systolic murmur Abd: soft, non-tender, non-distended; no bruit; positive bowel sounds Ext: no edema, ecchymoses, or cyanosis Vascular: 2+ femoral pulses, 2+ radial pulses       Skin: warm and dry without rash Neuro: CN II-XII grossly intact; motor and sensory grossly intact    DATA AND STUDIES:  EKG:  Sinus rhythm without conduction issues  2D ECHO:  10/22  1. Left ventricular ejection fraction, by estimation, is 65 to 70%. The  left ventricle has normal function. The left ventricle has no regional  wall motion abnormalities. There is moderate left ventricular hypertrophy.  Left ventricular diastolic  parameters are consistent with Grade I diastolic dysfunction (impaired  relaxation).   2. Right ventricular systolic function is normal. The right ventricular  size is normal.   3. Left atrial size was mildly dilated.   4. The mitral valve is abnormal. Mild  mitral valve regurgitation.   5. The aortic valve is tricuspid. Aortic valve regurgitation is trivial.  Moderate to severe aortic valve stenosis. Aortic regurgitation PHT  measures 439 msec. Aortic valve area, by VTI measures 0.71 cm. Aortic  valve mean gradient measures 25.0 mmHg.  Aortic valve Vmax measures 3.14 m/s. DI is 0.25.  CARDIAC CATH: n/a  STS RISK CALCULATOR:  Risk of Mortality:  2.181%  Renal Failure:  2.608%  Permanent Stroke:  2.057%  Prolonged Ventilation:  5.481%  DSW Infection:  0.092%  Reoperation:  3.071%  Morbidity or Mortality:  9.339%  Short Length of Stay:  33.726%  Long Length of Stay:  5.007%   NHYA CLASS: 2-3    ASSESSMENT AND PLAN:   Nonrheumatic aortic valve stenosis - Plan: EKG 12-Lead, Basic metabolic panel, CBC The patient has developed moderate to severe paradoxical low-flow low gradient aortic stenosis and is symptomatic.  I reviewed her echocardiogram which demonstrates a highly calcified valve with restricted motion.  We had a long conversation about possible evaluation and treatment.  She is interested in pursuing this.  I will refer the patient for a CTA, cardiac catheterization, and cardiothoracic surgical consult.  Further recommendations will follow the results of this testing.  I have personally reviewed the patients imaging data as summarized above.  I have reviewed the natural history of aortic stenosis with the patient and family members who are present today. We have discussed the limitations of medical therapy and the poor prognosis associated with symptomatic aortic stenosis. We have also reviewed potential treatment options, including palliative medical therapy, conventional surgical aortic valve replacement, and transcatheter aortic valve replacement. We discussed treatment options in the context of this patient's specific comorbid medical conditions.   All of the patient's questions were answered today. Will make further  recommendations based on the results of studies outlined above.    11/22, MD  01/18/2021 11:33 AM    [facility] [address] [place],  [place], [place]  [address] Phone: [phone]; Fax: [phone]

## 2021-01-18 NOTE — H&P (View-Only) (Signed)
Patient ID: Nicole Graves MRN: [id number] DOB/AGE: [birth year] 75 y.o.  Primary Care Physician:Miller, Lattie Haw, MD Primary Cardiologist: Fransico Him, MD   PROBLEM LIST: 1.  Paradoxical low-flow low gradient aortic stenosis with an aortic valve area of 0.71 cm and a mean gradient of 25 mmHg with normal ejection fraction; no conduction abnormalities 2.  Paroxysmal atrial tachycardia   HISTORY OF PRESENT ILLNESS: The patient is a 76 y.o. year old female with the indicated medical issues referred by Dr. Radford [name] for recommendations regarding the patient's aortic valvular disease.  The patient had been seen recently and there was noted to be progression of her aortic valvular disease on a recent echocardiogram.  The patient tells me that she has had murmur for several years.  She believes she was told she had a murmur in her 30s.  She tells me that she has been short of breath chronically for some time but has been worse in the last 6 months.  She does have vertigo but has had some dizziness when going from sitting to standing and is not associated with vertigo.  Is having maybe twice in the last few months.  She has not hit her head.  She denies any exertional angina, signs or symptoms of stroke, paroxysmal nocturnal dyspnea or orthopnea.  She has had no severe bleeding or bruising.  She has required no emergency room visits or hospitalizations.  Her dental health is good and she sees a Pharmacist, community every 6 months.  She tells me that she and her family went on a cruise last year.  She was not able to do any of the excursions due to shortness of breath.  Over the last several years she has noticed that her activity level has been diminishing.  Showering or bathing makes her short of breath.  She was short of breath walking from her car into the clinic today.  Past Medical History:  Diagnosis Date   Anemia    Aortic stenosis    moderate AS with mean AVG 77mmHg by echo 01/2020   Arthritis    Knees,  neck, back   Chronic lower back pain    with siatica   Colon polyp 1999   Depression    GERD (gastroesophageal reflux disease)    Mild aortic insufficiency    Mild mitral regurgitation    Orthostatic hypotension    PAT (paroxysmal atrial tachycardia) (HCC)    PONV (postoperative nausea and vomiting)    SVT (supraventricular tachycardia) (HCC)    TR (tricuspid regurgitation) 09/19/2016   moderate by echo 01/2020    Past Surgical History:  Procedure Laterality Date   CESAREAN SECTION  1964   [place]   removal extra fat inner thighs   EYE SURGERY Bilateral     Cataract extraction with IOL   TOTAL KNEE ARTHROPLASTY Right 11/30/2015   Procedure: RIGHT TOTAL KNEE ARTHROPLASTY;  Surgeon: Gaynelle Arabian, MD;  Location: [facility];  Service: Orthopedics;  Laterality: Right;   TOTAL KNEE ARTHROPLASTY Left 11/14/2016   Procedure: LEFT TOTAL KNEE ARTHROPLASTY;  Surgeon: Gaynelle Arabian, MD;  Location: [facility];  Service: Orthopedics;  Laterality: Left;    Family History  Problem Relation Age of Onset   Colon cancer Mother    Heart attack Brother    Hypertension Brother    Stroke Neg Hx     Social History   Socioeconomic History   Marital status: Divorced  Spouse name: Not on file   Number of children: Not on file   Years of education: Not on file   Highest education level: Not on file  Occupational History   Not on file  Tobacco Use   Smoking status: Never   Smokeless tobacco: Never  Substance and Sexual Activity   Alcohol use: No   Drug use: No   Sexual activity: Not on file  Other Topics Concern   Not on file  Social History Narrative   Not on file   Social Determinants of Health   Financial Resource Strain: Not on file  Food Insecurity: Not on file  Transportation Needs: Not on file  Physical Activity: Not on file  Stress: Not on file  Social Connections: Not on file  Intimate Partner Violence: Not on file     Prior to  Admission medications   Medication Sig Start Date End Date Taking? Authorizing Provider  acetaminophen (TYLENOL) 650 MG CR tablet Take 2 tablets by mouth every 8 (eight) hours as needed.    [provider]  alendronate (FOSAMAX) 70 MG tablet Take 1 tablet by mouth once a week.    [provider]  aspirin EC 81 MG tablet Take 81 mg by mouth daily.    [provider]  atorvastatin (LIPITOR) 10 MG tablet Take 10 mg by mouth at bedtime.  02/11/15   [provider]  benztropine (COGENTIN) 0.5 MG tablet Take 1 tablet by mouth 2 (two) times daily.    [provider]  buPROPion (WELLBUTRIN XL) 300 MG 24 hr tablet Take 900 mg by mouth daily after breakfast.     [provider]  calcium carbonate (OS-CAL) 600 MG TABS tablet Take 600 mg by mouth as directed.    [provider]  Cholecalciferol (VITAMIN D3) 50 MCG (2000 UT) CAPS Take 2 capsules by mouth daily. 02/09/16   [provider]  cyclobenzaprine (FLEXERIL) 10 MG tablet Take 10 mg by mouth at bedtime.    [provider]  DULoxetine (CYMBALTA) 30 MG capsule Take 30 mg by mouth daily after breakfast.     [provider]  gabapentin (NEURONTIN) 300 MG capsule Take 300 mg by mouth daily.  12/20/19   [provider]  Iron, Ferrous Sulfate, 325 (65 Fe) MG TABS Take 1 tablet by mouth daily. 12/16/20   [provider]  Lurasidone HCl 60 MG TABS Take 60 mg by mouth daily at 6 PM.     [provider]  metFORMIN (GLUCOPHAGE) 500 MG tablet Take 500 mg by mouth 2 (two) times daily. 11/04/17   [provider]  metoprolol succinate (TOPROL XL) 25 MG 24 hr tablet Take 1 tablet (25 mg total) by mouth in the morning and at bedtime. 01/17/20   Dunn, Nedra Hai, PA-C  traMADol (ULTRAM) 50 MG tablet Take 50 mg by mouth every 6 (six) hours as needed for moderate pain.    [provider]  VITAMIN D PO Take 1 capsule by mouth daily.    [provider]    Allergies  Allergen Reactions   Sulfa Antibiotics Nausea And Vomiting and Other (See Comments)   Tizanidine Hcl     Other reaction(s): Unknown    REVIEW OF SYSTEMS:  General: no fevers/chills/night sweats Eyes: no blurry vision, diplopia, or amaurosis ENT: no sore throat or hearing loss Resp: no cough, wheezing, or hemoptysis CV: no edema or palpitations GI: no abdominal pain, nausea, vomiting, diarrhea, or constipation  GU: no dysuria, frequency, or hematuria Skin: no rash Neuro: no headache, numbness, tingling, or weakness of extremities Musculoskeletal: no joint pain or swelling Heme: no bleeding, DVT, or easy bruising Endo: no polydipsia or polyuria  BP 114/82    Pulse 69    Ht 5\' 3"  (1.6 m)    Wt 180 lb (81.6 kg)    SpO2 99%    BMI 31.89 kg/m   PHYSICAL EXAM: GEN:  AO x 3 in no acute distress HEENT: normal Dentition: Normal Neck: JVP normal. +1 carotid upstrokes without bruits. No thyromegaly. Lungs: equal expansion, clear bilaterally CV: Apex is discrete and nondisplaced, RRR with 3/6 systolic murmur Abd: soft, non-tender, non-distended; no bruit; positive bowel sounds Ext: no edema, ecchymoses, or cyanosis Vascular: 2+ femoral pulses, 2+ radial pulses       Skin: warm and dry without rash Neuro: CN II-XII grossly intact; motor and sensory grossly intact    DATA AND STUDIES:  EKG:  Sinus rhythm without conduction issues  2D ECHO:  10/22  1. Left ventricular ejection fraction, by estimation, is 65 to 70%. The  left ventricle has normal function. The left ventricle has no regional  wall motion abnormalities. There is moderate left ventricular hypertrophy.  Left ventricular diastolic  parameters are consistent with Grade I diastolic dysfunction (impaired  relaxation).   2. Right ventricular systolic function is normal. The right ventricular  size is normal.   3. Left atrial size was mildly dilated.   4. The mitral valve is abnormal. Mild  mitral valve regurgitation.   5. The aortic valve is tricuspid. Aortic valve regurgitation is trivial.  Moderate to severe aortic valve stenosis. Aortic regurgitation PHT  measures 439 msec. Aortic valve area, by VTI measures 0.71 cm. Aortic  valve mean gradient measures 25.0 mmHg.  Aortic valve Vmax measures 3.14 m/s. DI is 0.25.  CARDIAC CATH: n/a  STS RISK CALCULATOR:  Risk of Mortality:  2.181%  Renal Failure:  2.608%  Permanent Stroke:  2.057%  Prolonged Ventilation:  5.481%  DSW Infection:  0.092%  Reoperation:  3.071%  Morbidity or Mortality:  9.339%  Short Length of Stay:  33.726%  Long Length of Stay:  5.007%   NHYA CLASS: 2-3    ASSESSMENT AND PLAN:   Nonrheumatic aortic valve stenosis - Plan: EKG [id number], Basic metabolic panel, CBC The patient has developed moderate to severe paradoxical low-flow low gradient aortic stenosis and is symptomatic.  I reviewed her echocardiogram which demonstrates a highly calcified valve with restricted motion.  We had a long conversation about possible evaluation and treatment.  She is interested in pursuing this.  I will refer the patient for a CTA, cardiac catheterization, and cardiothoracic surgical consult.  Further recommendations will follow the results of this testing.  I have personally reviewed the patients imaging data as summarized above.  I have reviewed the natural history of aortic stenosis with the patient and family members who are present today. We have discussed the limitations of medical therapy and the poor prognosis associated with symptomatic aortic stenosis. We have also reviewed potential treatment options, including palliative medical therapy, conventional surgical aortic valve replacement, and transcatheter aortic valve replacement. We discussed treatment options in the context of this patient's specific comorbid medical conditions.   All of the patient's questions were answered today. Will make further  recommendations based on the results of studies outlined above.    Early Osmond, MD  01/18/2021 11:33 AM    [facility]  [place], [place], [place]  [address] Phone: [phone]; Fax: [phone]

## 2021-01-26 ENCOUNTER — Other Ambulatory Visit: Payer: Self-pay | Admitting: Physician Assistant

## 2021-01-28 DIAGNOSIS — M5412 Radiculopathy, cervical region: Secondary | ICD-10-CM | POA: Diagnosis not present

## 2021-02-12 ENCOUNTER — Other Ambulatory Visit: Payer: Medicare PPO | Admitting: *Deleted

## 2021-02-12 ENCOUNTER — Telehealth: Payer: Self-pay | Admitting: Internal Medicine

## 2021-02-12 ENCOUNTER — Other Ambulatory Visit: Payer: Self-pay

## 2021-02-12 DIAGNOSIS — I350 Nonrheumatic aortic (valve) stenosis: Secondary | ICD-10-CM

## 2021-02-12 LAB — CBC
Hematocrit: 35.6 % (ref 34.0–46.6)
Hemoglobin: 11.6 g/dL (ref 11.1–15.9)
MCH: 28.5 pg (ref 26.6–33.0)
MCHC: 32.6 g/dL (ref 31.5–35.7)
MCV: 88 fL (ref 79–97)
Platelets: 308 10*3/uL (ref 150–450)
RBC: 4.07 x10E6/uL (ref 3.77–5.28)
RDW: 13.5 % (ref 11.7–15.4)
WBC: 6.7 10*3/uL (ref 3.4–10.8)

## 2021-02-12 LAB — BASIC METABOLIC PANEL
BUN/Creatinine Ratio: 17 (ref 12–28)
BUN: 21 mg/dL (ref 8–27)
CO2: 25 mmol/L (ref 20–29)
Calcium: 10 mg/dL (ref 8.7–10.3)
Chloride: 103 mmol/L (ref 96–106)
Creatinine, Ser: 1.25 mg/dL — ABNORMAL HIGH (ref 0.57–1.00)
Glucose: 78 mg/dL (ref 70–99)
Potassium: 4.1 mmol/L (ref 3.5–5.2)
Sodium: 141 mmol/L (ref 134–144)
eGFR: 44 mL/min/{1.73_m2} — ABNORMAL LOW (ref >59)

## 2021-02-12 NOTE — Telephone Encounter (Signed)
Attempted to contact pt.  Phone rang several times with no answer and no VM. 

## 2021-02-12 NOTE — Telephone Encounter (Signed)
Spoke with pt and she said that Dr. Lynnette Caffey had mentioned needing CTs but nothing was scheduled.  She also inquired about the TAVR not being scheduled.  Explained to the pt that the first step is the heart cath and then the structural team will contact her with next steps (I.e. CTs, surgeon's appt, etc) and then she will be scheduled for TAVR if that is the appropriate procedure.  Pt very appreciative for call.

## 2021-02-12 NOTE — Telephone Encounter (Signed)
Patient had questions regarding her upcoming procedure 02/17/21. She would like to talk to Dr. Trula Ore Nurse

## 2021-02-12 NOTE — Telephone Encounter (Signed)
Follow Up:    Patient is calling back from today.

## 2021-02-16 ENCOUNTER — Telehealth: Payer: Self-pay | Admitting: *Deleted

## 2021-02-16 NOTE — Telephone Encounter (Addendum)
Cardiac catheterization scheduled at [facility] for: Wednesday February 17, 2021 10 AM [facility] Main Entrance A [facility]) at: 6:30 AM-pre-procedure hydration per protocol GFR 44   Diet-no solid food after midnight prior to cath, clear liquids until 5 AM day of procedure.  Medication instructions for procedure: -Hold:  Metformin-day of procedure and 48 hours post procedure -Except hold medications usual morning medications can be taken pre-cath with sips of water including aspirin 81 mg.    Confirmed patient has responsible adult to drive home post procedure and be with patient first 24 hours after arriving home.  [facility] does allow one visitor to accompany you and wait in the hospital waiting room while you are there for your procedure. You and your visitor will be asked to wear a mask once you enter the hospital.   Patient reports does not currently have any new symptoms concerning for COVID-19 and no household members with COVID-19 like illness.                 Reviewed procedure/mask/visitor instructions with patient.

## 2021-02-17 ENCOUNTER — Encounter (HOSPITAL_COMMUNITY): Payer: Self-pay | Admitting: Internal Medicine

## 2021-02-17 ENCOUNTER — Ambulatory Visit (HOSPITAL_COMMUNITY)
Admission: RE | Admit: 2021-02-17 | Discharge: 2021-02-17 | Disposition: A | Discharge status: Home / Self Care | Payer: Medicare PPO | Attending: Internal Medicine | Admitting: Internal Medicine

## 2021-02-17 ENCOUNTER — Encounter (HOSPITAL_COMMUNITY)
Admission: RE | Disposition: A | Discharge status: Home / Self Care | Payer: Self-pay | Source: Home / Self Care | Attending: Internal Medicine

## 2021-02-17 ENCOUNTER — Other Ambulatory Visit: Payer: Self-pay

## 2021-02-17 DIAGNOSIS — I350 Nonrheumatic aortic (valve) stenosis: Secondary | ICD-10-CM | POA: Diagnosis not present

## 2021-02-17 DIAGNOSIS — I471 Supraventricular tachycardia: Secondary | ICD-10-CM | POA: Insufficient documentation

## 2021-02-17 HISTORY — PX: RIGHT HEART CATH AND CORONARY ANGIOGRAPHY: CATH118264

## 2021-02-17 LAB — POCT I-STAT EG7
Acid-Base Excess: 0 mmol/L (ref 0.0–2.0)
Acid-Base Excess: 0 mmol/L (ref 0.0–2.0)
Bicarbonate: 25.4 mmol/L (ref 20.0–28.0)
Bicarbonate: 26.3 mmol/L (ref 20.0–28.0)
Calcium, Ion: 1.17 mmol/L (ref 1.15–1.40)
Calcium, Ion: 1.33 mmol/L (ref 1.15–1.40)
HCT: 31 % — ABNORMAL LOW (ref 36.0–46.0)
HCT: 33 % — ABNORMAL LOW (ref 36.0–46.0)
Hemoglobin: 10.5 g/dL — ABNORMAL LOW (ref 12.0–15.0)
Hemoglobin: 11.2 g/dL — ABNORMAL LOW (ref 12.0–15.0)
O2 Saturation: 70 %
O2 Saturation: 72 %
Potassium: 3.8 mmol/L (ref 3.5–5.1)
Potassium: 4.3 mmol/L (ref 3.5–5.1)
Sodium: 139 mmol/L (ref 135–145)
Sodium: 142 mmol/L (ref 135–145)
TCO2: 27 mmol/L (ref 22–32)
TCO2: 28 mmol/L (ref 22–32)
pCO2, Ven: 45.8 mmHg (ref 44.0–60.0)
pCO2, Ven: 48.1 mmHg (ref 44.0–60.0)
pH, Ven: 7.346 (ref 7.250–7.430)
pH, Ven: 7.353 (ref 7.250–7.430)
pO2, Ven: 38 mmHg (ref 32.0–45.0)
pO2, Ven: 41 mmHg (ref 32.0–45.0)

## 2021-02-17 LAB — POCT I-STAT 7, (LYTES, BLD GAS, ICA,H+H)
Acid-Base Excess: 0 mmol/L (ref 0.0–2.0)
Bicarbonate: 25.6 mmol/L (ref 20.0–28.0)
Calcium, Ion: 1.26 mmol/L (ref 1.15–1.40)
HCT: 32 % — ABNORMAL LOW (ref 36.0–46.0)
Hemoglobin: 10.9 g/dL — ABNORMAL LOW (ref 12.0–15.0)
O2 Saturation: 93 %
Potassium: 4.1 mmol/L (ref 3.5–5.1)
Sodium: 140 mmol/L (ref 135–145)
TCO2: 27 mmol/L (ref 22–32)
pCO2 arterial: 44.8 mmHg (ref 32.0–48.0)
pH, Arterial: 7.365 (ref 7.350–7.450)
pO2, Arterial: 68 mmHg — ABNORMAL LOW (ref 83.0–108.0)

## 2021-02-17 SURGERY — RIGHT HEART CATH AND CORONARY ANGIOGRAPHY
Anesthesia: LOCAL

## 2021-02-17 MED ORDER — HEPARIN SODIUM (PORCINE) 1000 UNIT/ML IJ SOLN
INTRAMUSCULAR | Status: DC | PRN
  Administered 2021-02-17: 5000 [IU] via INTRAVENOUS

## 2021-02-17 MED ORDER — ASPIRIN 81 MG PO CHEW: 81.0000 mg | CHEWABLE_TABLET | ORAL | Status: DC

## 2021-02-17 MED ORDER — IOHEXOL 350 MG/ML SOLN
INTRAVENOUS | Status: DC | PRN
  Administered 2021-02-17: 45 mL

## 2021-02-17 MED ORDER — SODIUM CHLORIDE 0.9 % IV SOLN: 250.0000 mL | INTRAVENOUS | Status: DC | PRN

## 2021-02-17 MED ORDER — FENTANYL CITRATE (PF) 100 MCG/2ML IJ SOLN
INTRAMUSCULAR | Status: DC | PRN
  Administered 2021-02-17: 25 ug via INTRAVENOUS

## 2021-02-17 MED ORDER — MIDAZOLAM HCL 2 MG/2ML IJ SOLN
INTRAMUSCULAR | Status: AC
  Filled 2021-02-17: qty 2

## 2021-02-17 MED ORDER — MIDAZOLAM HCL 2 MG/2ML IJ SOLN
INTRAMUSCULAR | Status: DC | PRN
  Administered 2021-02-17: 1 mg via INTRAVENOUS

## 2021-02-17 MED ORDER — LIDOCAINE HCL (PF) 1 % IJ SOLN
INTRAMUSCULAR | Status: DC | PRN
  Administered 2021-02-17 (×2): 2 mL

## 2021-02-17 MED ORDER — VERAPAMIL HCL 2.5 MG/ML IV SOLN
INTRAVENOUS | Status: DC | PRN
  Administered 2021-02-17: 5 mg via INTRA_ARTERIAL

## 2021-02-17 MED ORDER — VERAPAMIL HCL 2.5 MG/ML IV SOLN
INTRAVENOUS | Status: AC
  Filled 2021-02-17: qty 2

## 2021-02-17 MED ORDER — SODIUM CHLORIDE 0.9 % WEIGHT BASED INFUSION: 1.0000 mL/kg/h | INTRAVENOUS | Status: DC

## 2021-02-17 MED ORDER — FENTANYL CITRATE (PF) 100 MCG/2ML IJ SOLN
INTRAMUSCULAR | Status: AC
  Filled 2021-02-17: qty 2

## 2021-02-17 MED ORDER — HEPARIN (PORCINE) IN NACL 1000-0.9 UT/500ML-% IV SOLN
INTRAVENOUS | Status: DC | PRN
  Administered 2021-02-17 (×2): 500 mL

## 2021-02-17 MED ORDER — HEPARIN (PORCINE) IN NACL 1000-0.9 UT/500ML-% IV SOLN
INTRAVENOUS | Status: AC
  Filled 2021-02-17: qty 1000

## 2021-02-17 MED ORDER — LIDOCAINE HCL (PF) 1 % IJ SOLN
INTRAMUSCULAR | Status: AC
  Filled 2021-02-17: qty 30

## 2021-02-17 MED ORDER — HEPARIN SODIUM (PORCINE) 1000 UNIT/ML IJ SOLN
INTRAMUSCULAR | Status: AC
  Filled 2021-02-17: qty 10

## 2021-02-17 MED ORDER — SODIUM CHLORIDE 0.9% FLUSH: 3.0000 mL | INTRAVENOUS | Status: DC | PRN

## 2021-02-17 MED ORDER — SODIUM CHLORIDE 0.9 % WEIGHT BASED INFUSION
3.0000 mL/kg/h | INTRAVENOUS | Status: AC
  Administered 2021-02-17: 3 mL/kg/h via INTRAVENOUS

## 2021-02-17 SURGICAL SUPPLY — 12 items
CATH BALLN WEDGE 5F 110CM (CATHETERS) ×1 IMPLANT
CATH INFINITI 6F ANG MULTIPACK (CATHETERS) ×1 IMPLANT
CATH INFINITI 6F FL3.5 (CATHETERS) ×1 IMPLANT
DEVICE RAD COMP TR BAND LRG (VASCULAR PRODUCTS) ×1 IMPLANT
GLIDESHEATH SLEND SS 6F .021 (SHEATH) ×1 IMPLANT
GUIDEWIRE TIGER .035X300 (WIRE) ×1 IMPLANT
KIT HEART LEFT (KITS) ×2 IMPLANT
PACK CARDIAC CATHETERIZATION (CUSTOM PROCEDURE TRAY) ×2 IMPLANT
SHEATH GLIDE SLENDER 4/5FR (SHEATH) ×1 IMPLANT
TRANSDUCER W/STOPCOCK (MISCELLANEOUS) ×2 IMPLANT
TUBING CIL FLEX 10 FLL-RA (TUBING) ×2 IMPLANT
WIRE EMERALD 3MM-J .035X260CM (WIRE) ×1 IMPLANT

## 2021-02-17 NOTE — Interval H&P Note (Signed)
History and Physical Interval Note:  02/17/2021 8:09 AM  Nicole Graves  has presented today for surgery, with the diagnosis of tavr evaluation.  The various methods of treatment have been discussed with the patient and family. After consideration of risks, benefits and other options for treatment, the patient has consented to  Procedure(s): RIGHT/LEFT HEART CATH AND CORONARY ANGIOGRAPHY (N/A) as a surgical intervention.  The patient's history has been reviewed, patient examined, no change in status, stable for surgery.  I have reviewed the patient's chart and labs.  Questions were answered to the patient's satisfaction.    Cath Lab Visit (complete for each Cath Lab visit)  Clinical Evaluation Leading to the Procedure:   ACS: No.  Non-ACS:    Anginal Classification: CCS I  Anti-ischemic medical therapy: Minimal Therapy (1 class of medications)  Non-Invasive Test Results: No non-invasive testing performed  Prior CABG: No previous CABG        Orbie Pyo

## 2021-02-20 ENCOUNTER — Telehealth: Payer: Self-pay | Admitting: Physician Assistant

## 2021-02-20 NOTE — Telephone Encounter (Signed)
Pt daughter called today stating she is more dizzy than normal. She has not passed out, but is describing possibly near-syncope. Pt did take metoprolol and metformin today, but has not really had any substantial PO intake (states she has not eaten anything today). Cath access through radial artery, daughter states the site looks good, no hematoma. She denies active bleeding. Normal cors on cath, normal EF on echo. They do not have a BP cuff, no HR or BG known at this time.   They describe dizziness is worse than normal. I think this may be due to BB and metformin without PO intake. I have advised hydration with electrolytes and solid food as tolerated. If this doesn't help, she will go to the ER for evaluation.   Daughter expressed understanding of the plan.

## 2021-02-22 ENCOUNTER — Other Ambulatory Visit: Payer: Self-pay

## 2021-02-22 DIAGNOSIS — I350 Nonrheumatic aortic (valve) stenosis: Secondary | ICD-10-CM

## 2021-02-22 NOTE — Telephone Encounter (Signed)
Spoke with the patient who reports that she is feeling much better. Denies any dizziness or lightheadedness. She states that she has been drinking a lot of fluids including water and Gatorade. Advised to reach out to PCP if symptoms come back. Patient verbalized understanding.

## 2021-02-24 ENCOUNTER — Other Ambulatory Visit: Payer: Medicare PPO | Admitting: *Deleted

## 2021-02-24 ENCOUNTER — Other Ambulatory Visit: Payer: Self-pay

## 2021-02-24 ENCOUNTER — Ambulatory Visit (HOSPITAL_COMMUNITY): Payer: Medicare PPO

## 2021-02-24 DIAGNOSIS — I350 Nonrheumatic aortic (valve) stenosis: Secondary | ICD-10-CM | POA: Diagnosis not present

## 2021-02-24 LAB — BASIC METABOLIC PANEL
BUN/Creatinine Ratio: 14 (ref 12–28)
BUN: 18 mg/dL (ref 8–27)
CO2: 24 mmol/L (ref 20–29)
Calcium: 10.3 mg/dL (ref 8.7–10.3)
Chloride: 97 mmol/L (ref 96–106)
Creatinine, Ser: 1.27 mg/dL — ABNORMAL HIGH (ref 0.57–1.00)
Glucose: 109 mg/dL — ABNORMAL HIGH (ref 70–99)
Potassium: 4.2 mmol/L (ref 3.5–5.2)
Sodium: 135 mmol/L (ref 134–144)
eGFR: 44 mL/min/{1.73_m2} — ABNORMAL LOW (ref >59)

## 2021-03-02 ENCOUNTER — Other Ambulatory Visit: Payer: Self-pay

## 2021-03-02 ENCOUNTER — Ambulatory Visit (HOSPITAL_COMMUNITY)
Admission: RE | Admit: 2021-03-02 | Discharge: 2021-03-02 | Disposition: A | Discharge status: Home / Self Care | Payer: Medicare PPO | Source: Ambulatory Visit | Attending: Internal Medicine | Admitting: Internal Medicine

## 2021-03-02 ENCOUNTER — Encounter (HOSPITAL_COMMUNITY): Payer: Self-pay

## 2021-03-02 DIAGNOSIS — K5730 Diverticulosis of large intestine without perforation or abscess without bleeding: Secondary | ICD-10-CM | POA: Diagnosis not present

## 2021-03-02 DIAGNOSIS — Z9049 Acquired absence of other specified parts of digestive tract: Secondary | ICD-10-CM | POA: Diagnosis not present

## 2021-03-02 DIAGNOSIS — I350 Nonrheumatic aortic (valve) stenosis: Secondary | ICD-10-CM | POA: Diagnosis not present

## 2021-03-02 DIAGNOSIS — R911 Solitary pulmonary nodule: Secondary | ICD-10-CM | POA: Diagnosis not present

## 2021-03-02 DIAGNOSIS — K449 Diaphragmatic hernia without obstruction or gangrene: Secondary | ICD-10-CM | POA: Diagnosis not present

## 2021-03-02 DIAGNOSIS — J9811 Atelectasis: Secondary | ICD-10-CM | POA: Diagnosis not present

## 2021-03-02 MED ORDER — IOHEXOL 350 MG/ML SOLN
95.0000 mL | Freq: Once | INTRAVENOUS | Status: AC | PRN
Start: 2021-03-02 — End: 2021-03-02
  Administered 2021-03-02: 95 mL via INTRAVENOUS

## 2021-03-08 ENCOUNTER — Encounter: Payer: Self-pay | Admitting: Surgery

## 2021-03-08 ENCOUNTER — Other Ambulatory Visit: Payer: Self-pay

## 2021-03-08 ENCOUNTER — Institutional Professional Consult (permissible substitution): Payer: Medicare PPO | Admitting: Surgery

## 2021-03-08 VITALS — BP 131/85 | HR 74 | Resp 20 | Ht 63.0 in | Wt 180.0 lb

## 2021-03-08 DIAGNOSIS — I350 Nonrheumatic aortic (valve) stenosis: Secondary | ICD-10-CM

## 2021-03-08 NOTE — Progress Notes (Signed)
Patient ID: Nicole Graves, female   DOB: [birth year], 76 y.o.   MRN: [id number]  [facility]IC        [address]       [place] [address]             [phone]          CARDIOTHORACIC SURGERY CONSULTATION REPORT  PCP is Nicole Lass, MD Referring Provider is Nicole Sciara, MD Primary Cardiologist is Nicole Him, MD  Reason for consultation: Severe aortic stenosis  HPI:  The patient is an 76 year old woman with a history of paroxysmal atrial tachycardia, degenerative arthritis status post bilateral total knee replacement, and aortic stenosis that has been followed by periodic echocardiograms.  She is here today with her daughter.  She reports at least a 1 year history of tiredness and exertional fatigue and probably 6 months of progressive exertional shortness of breath.  She said this typically happens with doing any heavy work or walking up stairs or hills.  She has also had some episodes of dizziness particularly with bending over and says she has had a couple falls over the past few months.  She denies any orthopnea.  She has had no peripheral edema.  She denies any chest pain or pressure.  Her most recent echo on 12/03/2020 showed a calcified aortic valve with restricted mobility.  The mean gradient was measured at 25 mmHg with a peak gradient of 39 mmHg.  Aortic valve area was 0.71 cm with a dimensionless index of 0.25.  Stroke-volume index was 28.  Left ventricular ejection fraction was 65 to 70% with grade 1 diastolic dysfunction.  There is trivial aortic insufficiency.  Cardiac catheterization on 02/17/2021 showed normal right dominant coronary circulation.  Filling pressures were normal with a cardiac index of 4.0.  She is divorced and lives with her daughter.  She is a lifelong non-smoker.  She sees her dentist regularly.  Past Medical History:  Diagnosis Date   Anemia    Aortic stenosis    moderate AS with  mean AVG 7mmHg by echo 01/2020   Arthritis    Knees, neck, back   Chronic lower back pain    with siatica   Colon polyp 1999   Depression    GERD (gastroesophageal reflux disease)    Mild aortic insufficiency    Mild mitral regurgitation    Orthostatic hypotension    PAT (paroxysmal atrial tachycardia) (HCC)    PONV (postoperative nausea and vomiting)    SVT (supraventricular tachycardia) (HCC)    TR (tricuspid regurgitation) 09/19/2016   moderate by echo 01/2020    Past Surgical History:  Procedure Laterality Date   CESAREAN SECTION  1964   [place]   removal extra fat inner thighs   EYE SURGERY Bilateral     Cataract extraction with IOL   RIGHT HEART CATH AND CORONARY ANGIOGRAPHY N/A 02/17/2021   Procedure: RIGHT HEART CATH AND CORONARY ANGIOGRAPHY;  Surgeon: Early Osmond, MD;  Location: [facility];  Service: Cardiovascular;  Laterality: N/A;   TOTAL KNEE ARTHROPLASTY Right 11/30/2015   Procedure: RIGHT TOTAL KNEE ARTHROPLASTY;  Surgeon: Gaynelle Arabian, MD;  Location: [facility];  Service: Orthopedics;  Laterality: Right;   TOTAL KNEE ARTHROPLASTY Left 11/14/2016   Procedure: LEFT TOTAL KNEE ARTHROPLASTY;  Surgeon: Gaynelle Arabian, MD;  Location: [facility];  Service: Orthopedics;  Laterality: Left;  Family History  Problem Relation Age of Onset   Colon cancer Mother    Heart attack Brother    Hypertension Brother    Stroke Neg Hx     Social History   Socioeconomic History   Marital status: Divorced    Spouse name: Not on file   Number of children: Not on file   Years of education: Not on file   Highest education level: Not on file  Occupational History   Not on file  Tobacco Use   Smoking status: Never   Smokeless tobacco: Never  Substance and Sexual Activity   Alcohol use: No   Drug use: No   Sexual activity: Not on file  Other Topics Concern   Not on file  Social History Narrative   Not on file   Social  Determinants of Health   Financial Resource Strain: Not on file  Food Insecurity: Not on file  Transportation Needs: Not on file  Physical Activity: Not on file  Stress: Not on file  Social Connections: Not on file  Intimate Partner Violence: Not on file    Prior to Admission medications   Medication Sig Start Date End Date Taking? Authorizing Provider  acetaminophen (TYLENOL) 650 MG CR tablet Take 1,300 mg by mouth daily.   Yes [provider]  alendronate (FOSAMAX) 70 MG tablet Take 1 tablet by mouth once a week.   Yes [provider]  atorvastatin (LIPITOR) 10 MG tablet Take 10 mg by mouth at bedtime.  02/11/15  Yes [provider]  buPROPion (WELLBUTRIN XL) 300 MG 24 hr tablet Take 300 mg by mouth daily after breakfast.   Yes [provider]  calcium carbonate (OS-CAL) 600 MG TABS tablet Take 600 mg by mouth 2 (two) times daily with a meal.   Yes [provider]  Cholecalciferol (VITAMIN D3) 50 MCG (2000 UT) CAPS Take 4,000 capsules by mouth daily. 02/09/16  Yes [provider]  cyclobenzaprine (FLEXERIL) 10 MG tablet Take 10 mg by mouth at bedtime.   Yes [provider]  DULoxetine (CYMBALTA) 30 MG capsule Take 90 mg by mouth daily after breakfast.   Yes [provider]  gabapentin (NEURONTIN) 300 MG capsule Take 300 mg by mouth 2 (two) times daily. 12/20/19  Yes [provider]  Iron, Ferrous Sulfate, 325 (65 Fe) MG TABS Take 325 mg by mouth daily. 12/16/20  Yes [provider]  Lurasidone HCl 60 MG TABS Take 60 mg by mouth daily with supper. [name]   Yes [provider]  metFORMIN (GLUCOPHAGE) 500 MG tablet Take 500 mg by mouth 2 (two) times daily. 11/04/17  Yes [provider]  metoprolol succinate (TOPROL-XL) 25 MG 24 hr tablet TAKE 1 TABLET(25 MG) BY MOUTH IN THE MORNING AND AT BEDTIME 01/26/21  Yes Dunn, Dayna N, PA-C  traMADol (ULTRAM) 50 MG tablet Take 50 mg by mouth at  bedtime.   Yes [provider]  vitamin B-12 (CYANOCOBALAMIN) 1000 MCG tablet Take 1,000 mcg by mouth daily.   Yes [provider]    Current Outpatient Medications  Medication Sig Dispense Refill   acetaminophen (TYLENOL) 650 MG CR tablet Take 1,300 mg by mouth daily.     alendronate (FOSAMAX) 70 MG tablet Take 1 tablet by mouth once a week.     atorvastatin (LIPITOR) 10 MG tablet Take 10 mg by mouth at bedtime.      buPROPion (WELLBUTRIN XL) 300 MG 24 hr tablet Take 300 mg  by mouth daily after breakfast.     calcium carbonate (OS-CAL) 600 MG TABS tablet Take 600 mg by mouth 2 (two) times daily with a meal.     Cholecalciferol (VITAMIN D3) 50 MCG (2000 UT) CAPS Take 4,000 capsules by mouth daily.     cyclobenzaprine (FLEXERIL) 10 MG tablet Take 10 mg by mouth at bedtime.     DULoxetine (CYMBALTA) 30 MG capsule Take 90 mg by mouth daily after breakfast.     gabapentin (NEURONTIN) 300 MG capsule Take 300 mg by mouth 2 (two) times daily.     Iron, Ferrous Sulfate, 325 (65 Fe) MG TABS Take 325 mg by mouth daily.     Lurasidone HCl 60 MG TABS Take 60 mg by mouth daily with supper. [name]     metFORMIN (GLUCOPHAGE) 500 MG tablet Take 500 mg by mouth 2 (two) times daily.  3   metoprolol succinate (TOPROL-XL) 25 MG 24 hr tablet TAKE 1 TABLET(25 MG) BY MOUTH IN THE MORNING AND AT BEDTIME 180 tablet 1   traMADol (ULTRAM) 50 MG tablet Take 50 mg by mouth at bedtime.     vitamin B-12 (CYANOCOBALAMIN) 1000 MCG tablet Take 1,000 mcg by mouth daily.     Current Facility-Administered Medications  Medication Dose Route Frequency Provider Last Rate Last Admin   sodium chloride flush (NS) 0.9 % injection 3 mL  3 mL Intravenous Q12H Early Osmond, MD        Allergies  Allergen Reactions   Sulfa Antibiotics Nausea And Vomiting and Other (See Comments)   Tizanidine Hcl Other (See Comments)    Other reaction(s): Unknown      Review of Systems:   General:  normal appetite, +  decreased energy, no weight gain, no weight loss, no fever  Cardiac:  no chest pain with exertion, no chest pain at rest, +SOB with moderate exertion, no resting SOB, no PND, no orthopnea, + palpitations, + arrhythmia, + atrial fibrillation, no LE edema, + dizzy spells, no syncope  Respiratory:  + exertional shortness of breath, no home oxygen, no productive cough, + dry cough, no bronchitis, no wheezing, no hemoptysis, no asthma, no pain with inspiration or cough, no sleep apnea, no CPAP at night  GI:   no difficulty swallowing, + reflux, no frequent heartburn, + hiatal hernia, no abdominal pain, + constipation, no diarrhea, no hematochezia, no hematemesis, no melena  GU:   no dysuria,  no frequency, no urinary tract infection, no hematuria, no kidney stones, + kidney disease  Vascular:  no pain suggestive of claudication, no pain in feet, no leg cramps, no varicose veins, no DVT, no non-healing foot ulcer  Neuro:   no stroke, no TIA's, no seizures, no headaches, no temporary blindness one eye,  no slurred speech, no peripheral neuropathy, no chronic pain, no instability of gait, no memory/cognitive dysfunction  Musculoskeletal: + arthritis, no joint swelling, no myalgias, no difficulty walking, normal mobility   Skin:   no rash, no itching, no skin infections, no pressure sores or ulcerations  Psych:   no anxiety, + depression, no nervousness, no unusual recent stress  Eyes:   no blurry vision, no floaters, no recent vision changes, no glasses or contacts  ENT:   no hearing loss, no loose or painful teeth, no dentures, last saw dentist 2023  Hematologic:  + easy bruising, no abnormal bleeding, no clotting disorder, no frequent epistaxis  Endocrine:  no diabetes, does not check CBG's at home  Physical Exam:   BP 131/85 (BP Location: Left Arm, Patient Position: Sitting, Cuff Size: Normal)    Pulse 74    Resp 20    Ht 5\' 3"  (1.6 m)    Wt 180 lb (81.6 kg)    SpO2 94% Comment: RA   BMI 31.89 kg/m    General:  well-appearing  HEENT:  Unremarkable, NCAT, PERLA, EOMI  Neck:   no JVD, no bruits, no adenopathy   Chest:   clear to auscultation, symmetrical breath sounds, no wheezes, no rhonchi   CV:   RRR, 3/6 systolic murmur RSB  Abdomen:  soft, non-tender, no masses   Extremities:  warm, well-perfused, pulses palpable at ankle, no lower extremity edema  Rectal/GU  Deferred  Neuro:   Grossly non-focal and symmetrical throughout  Skin:   Clean and dry, no rashes, no breakdown  Diagnostic Tests:  ECHOCARDIOGRAM REPORT         Patient Name:   GIANNAMARIE CIPRIAN [facility]  Date of Exam: 12/03/2020  Medical Rec #:  [id number]     Height:       63.0 in  Accession #:    [id number]    Weight:       187.2 lb  Date of Birth:  [birth year]     BSA:          1.880 m  Patient Age:    76 years      BP:           120/82 mmHg  Patient Gender: F             HR:           82 bpm.  Exam Location:  [address]   Procedure: 2D Echo, Cardiac Doppler and Color Doppler   Indications:    I35.0 Aortic Stenosis     History:        Patient has prior history of Echocardiogram examinations,  most                  recent 01/14/2020. SVT.     Sonographer:    Wilford Sports Rodgers-Jones RDCS  Referring Phys: [address] [place]     1. Left ventricular ejection fraction, by estimation, is 65 to 70%. The  left ventricle has normal function. The left ventricle has no regional  wall motion abnormalities. There is moderate left ventricular hypertrophy.  Left ventricular diastolic  parameters are consistent with Grade I diastolic dysfunction (impaired  relaxation).   2. Right ventricular systolic function is normal. The right ventricular  size is normal.   3. Left atrial size was mildly dilated.   4. The mitral valve is abnormal. Mild mitral valve regurgitation.   5. The aortic valve is tricuspid. Aortic valve regurgitation is trivial.  Moderate to severe aortic valve stenosis. Aortic regurgitation PHT   measures 439 msec. Aortic valve area, by VTI measures 0.71 cm. Aortic  valve mean gradient measures 25.0 mmHg.  Aortic valve Vmax measures 3.14 m/s. DI is 0.25.   Comparison(s): Changes from prior study are noted. 01/14/2020: LVEF  65-70%, mild to moderate AS - mean gradient 26 mmHg.   FINDINGS   Left Ventricle: Left ventricular ejection fraction, by estimation, is 65  to 70%. The left ventricle has normal function. The left ventricle has no  regional wall motion abnormalities. The left ventricular internal cavity  size was normal in size. There is   moderate left ventricular hypertrophy. Left ventricular diastolic  parameters  are consistent with Grade I diastolic dysfunction (impaired  relaxation). Indeterminate filling pressures.   Right Ventricle: The right ventricular size is normal. No increase in  right ventricular wall thickness. Right ventricular systolic function is  normal.   Left Atrium: Left atrial size was mildly dilated.   Right Atrium: Right atrial size was normal in size.   Pericardium: There is no evidence of pericardial effusion.   Mitral Valve: The mitral valve is abnormal. There is mild calcification of  the mitral valve leaflet(s). Mild mitral valve regurgitation.   Tricuspid Valve: The tricuspid valve is grossly normal. Tricuspid valve  regurgitation is mild.   Aortic Valve: The aortic valve is tricuspid. Aortic valve regurgitation is  trivial. Aortic regurgitation PHT measures 439 msec. Moderate to severe  aortic stenosis is present. Aortic valve mean gradient measures 25.0 mmHg.  Aortic valve peak gradient  measures 39.4 mmHg. Aortic valve area, by VTI measures 0.71 cm.   Pulmonic Valve: The pulmonic valve was grossly normal. Pulmonic valve  regurgitation is mild.   Aorta: The aortic root and ascending aorta are structurally normal, with  no evidence of dilitation.   IAS/Shunts: No atrial level shunt detected by color flow Doppler.      LEFT  VENTRICLE  PLAX 2D  LVIDd:         3.20 cm   Diastology  LVIDs:         2.30 cm   LV e' medial:    5.22 cm/s  LV PW:         1.30 cm   LV E/e' medial:  12.4  LV IVS:        1.30 cm   LV e' lateral:   7.07 cm/s  LVOT diam:     1.90 cm   LV E/e' lateral: 9.1  LV SV:         52  LV SV Index:   28  LVOT Area:     2.84 cm      RIGHT VENTRICLE  RV Basal diam:  4.20 cm  RV S prime:     11.80 cm/s   LEFT ATRIUM             Index        RIGHT ATRIUM           Index  LA diam:        3.30 cm 1.76 cm/m   RA Area:     10.80 cm  LA Vol (A2C):   25.7 ml 13.67 ml/m  RA Volume:   24.80 ml  13.19 ml/m  LA Vol (A4C):   55.8 ml 29.68 ml/m  LA Biplane Vol: 41.3 ml 21.97 ml/m   AORTIC VALVE  AV Area (Vmax):    0.78 cm  AV Area (Vmean):   0.71 cm  AV Area (VTI):     0.71 cm  AV Vmax:           314.00 cm/s  AV Vmean:          236.000 cm/s  AV VTI:            0.730 m  AV Peak Grad:      39.4 mmHg  AV Mean Grad:      25.0 mmHg  LVOT Vmax:         86.50 cm/s  LVOT Vmean:        58.700 cm/s  LVOT VTI:          0.183 m  LVOT/AV VTI ratio: 0.25  AI PHT:            439 msec     AORTA  Ao Root diam: 3.30 cm  Ao Asc diam:  3.70 cm   MITRAL VALVE               TRICUSPID VALVE  MV Area (PHT): 3.65 cm    TR Peak grad:   25.0 mmHg  MV Decel Time: 208 msec    TR Vmax:        250.00 cm/s  MV E velocity: 64.50 cm/s  MV A velocity: 84.90 cm/s  SHUNTS  MV E/A ratio:  0.76        Systemic VTI:  0.18 m                             Systemic Diam: 1.90 cm   Lyman Bishop MD  Electronically signed by Lyman Bishop MD  Signature Date/Time: 12/03/2020/11:37:20 AM         Final     Physicians  Panel Physicians Referring Physician Case Authorizing Physician  Early Osmond, MD (Primary)     Procedures  RIGHT HEART CATH AND CORONARY ANGIOGRAPHY   Conclusion  1.  Normal right dominant coronary artery circulation. 2.  Mean RA of 2 mmHg and mean wedge of 6 mmHg with cardiac output of 7.4  L/min and cardiac index of 4.0 L/min/m 3.  Right upper extremity tortuosity.   Indications  Nonrheumatic aortic valve stenosis [I35.0 (ICD-10-CM)]   Clinical Presentation  CHF/Shock Congestive heart failure not present. No shock present.   Procedural Details  Technical Details The patient is a 76 year old female with a history of paradoxical low-flow low gradient symptomatic aortic stenosis and paroxysmal atrial tachycardia was evaluated in outpatient setting and referred for preprocedural right heart catheterization and coronary angiography study.  After obtaining consent the patient was brought to the cardiac catheterization laboratory prepped draped sterile fashion ultrasound was used to gain access to the right radial artery and a 6 French glide sheath placed there.  5mg  of verapamil and 5000 units of heparin were administered.  A previously placed antecubital IV was exchanged for a 5 French femoral glide sheath.  Right heart catheterization and coronary angiography study was then performed.  At the conclusion of the procedure manual pressure was applied to the antecubital site and a TR band was placed on the radial site.   Estimated blood loss <50 mL.   During this procedure medications were administered to achieve and maintain moderate conscious sedation while the patient's heart rate, blood pressure, and oxygen saturation were continuously monitored and I was present face-to-face 100% of this time.   Medications (Filter: Administrations occurring from [id number] to 1052 on 02/17/21) Heparin (Porcine) in NaCl 1000-0.9 UT/500ML-% SOLN (mL) Total volume:  1,000 mL Date/Time Rate/Dose/Volume Action   02/17/21 0958 500 mL Given   0958 500 mL Given    fentaNYL (SUBLIMAZE) injection (mcg) Total dose:  25 mcg Date/Time Rate/Dose/Volume Action   02/17/21 0958 25 mcg Given    midazolam (VERSED) injection (mg) Total dose:  1 mg Date/Time Rate/Dose/Volume Action   02/17/21 0959 1 mg Given     lidocaine (PF) (XYLOCAINE) 1 % injection (mL) Total volume:  4 mL Date/Time Rate/Dose/Volume Action   02/17/21 1005 2 mL Given   1007 2 mL Given    heparin sodium (porcine) injection (Units) Total dose:  5,000 Units Date/Time  Rate/Dose/Volume Action   02/17/21 1008 5,000 Units Given    verapamil (ISOPTIN) injection (mg) Total dose:  5 mg Date/Time Rate/Dose/Volume Action   02/17/21 1008 5 mg Given    iohexol (OMNIPAQUE) 350 MG/ML injection (mL) Total volume:  45 mL Date/Time Rate/Dose/Volume Action   02/17/21 1032 45 mL Given    Sedation Time  Sedation Time Physician-1: 31 minutes 51 seconds Contrast  Medication Name Total Dose  iohexol (OMNIPAQUE) 350 MG/ML injection 45 mL   Radiation/Fluoro  Fluoro time: 7.3 (min) DAP: 9853 (mGycm2) Cumulative Air Kerma: 0000000 (mGy) Complications  Complications documented before study signed (02/17/2021 10:52 AM)   RIGHT HEART CATH AND CORONARY ANGIOGRAPHY  None Documented by Early Osmond, MD 02/17/2021 10:38 AM  Date Found: 02/17/2021  Time Range: Intraprocedure       Coronary Findings  Diagnostic Dominance: Right No diagnostic findings have been documented. Intervention  No interventions have been documented. Coronary Diagrams  Diagnostic Dominance: Right Intervention  Implants     No implant documentation for this case.   Syngo Images   Show images for CARDIAC CATHETERIZATION Images on Long Term Storage   Show images for Giahna, Dina to Procedure Log  Procedure Log    Hemo Data  Flowsheet Row Most Recent Value  Fick Cardiac Output 7.44 L/min  Fick Cardiac Output Index 4.01 (L/min)/BSA  RA A Wave 10 mmHg  RA V Wave 3 mmHg  RA Mean 2 mmHg  RV Systolic Pressure 30 mmHg  RV Diastolic Pressure 1 mmHg  RV EDP 4 mmHg  PA Systolic Pressure 33 mmHg  PA Diastolic Pressure 10 mmHg  PA Mean 20 mmHg  PW A Wave 7 mmHg  PW V Wave 11 mmHg  PW Mean 6 mmHg  QP/QS 0.91  TPVR Index 4.98 HRUI     ADDENDUM REPORT: 03/02/2021 17:46   CLINICAL DATA:  Severe Aortic Stenosis.   EXAM: Cardiac TAVR CT   TECHNIQUE: A non-contrast, gated CT scan was obtained with axial slices of 3 mm through the heart for aortic valve calcium scoring. A 120 kV retrospective, gated, contrast cardiac scan was obtained. Gantry rotation speed was 250 msecs and collimation was 0.6 mm. Nitroglycerin was not given. The 3D data set was reconstructed in 5% intervals of the 0-95% of the R-R cycle. Systolic and diastolic phases were analyzed on a dedicated workstation using MPR, MIP, and VRT modes. The patient received 100 cc of contrast.   FINDINGS: Image quality: Excellent.   Noise artifact is: Limited.   Valve Morphology: The aortic valve is tricuspid with bulky calcification of the NCC. The leaflets demonstrate severely restricted movement in systole.   Aortic Valve Calcium score: 1243   Aortic annular dimension:   Phase assessed: 35%   Annular area: 435 mm2   Annular perimeter: 25.9 mm   Max diameter: 25.9 mm   Min diameter: 22.1 mm   Annular and subannular calcification: Trivial calcification under the Home.   Membranous septum length: 7.4 mm   Optimal coplanar projection: LAO 15 CRA 5   Coronary Artery Height above Annulus:   Left Main: 12.2 mm   Right Coronary:   Sinus of Valsalva Measurements:   Non-coronary: 29.1 mm   Right-coronary: 27.4 mm   Left-coronary: 31.3 mm   Sinus of Valsalva Height:   Non-coronary: 22.8 mm   Right-coronary: 17.5 mm   Left-coronary: 19.9 mm   Sinotubular Junction: 31 mm   Ascending Thoracic Aorta: 35 mm   Coronary Arteries: Normal  coronary origin. Right dominance. The study was performed without use of NTG and is insufficient for plaque evaluation. Please refer to recent cardiac catheterization for coronary assessment.   Cardiac Morphology:   Right Atrium: Right atrial size is within normal limits.   Right Ventricle: The  right ventricular cavity is within normal limits.   Left Atrium: Left atrial size is normal in size with no left atrial appendage filling defect.   Left Ventricle: The ventricular cavity size is within normal limits. There are no stigmata of prior infarction. There is no abnormal filling defect. Normal left ventricular function, LVEF=68%. No regional wall motion abnormalities.   Pulmonary arteries: Normal in size without proximal filling defect.   Pulmonary veins: Normal pulmonary venous drainage.   Pericardium: Normal thickness with no significant effusion or calcium present.   Mitral Valve: The mitral valve is normal structure with mild annular calcium.   Extra-cardiac findings: Large hiatal hernia. See attached radiology report for non-cardiac structures.   IMPRESSION: 1. Tricuspid aortic valve with bulky calcification of the NCC.   2. Annular measurements in between 23/26 mm S3 TAVR (435 mm2). Measurements do support a 29 mm Evolut Pro. Would recommend structural heart team discussion for valve size.   3. No significant annular or subannular calcifications.   4. Sufficient coronary to annulus distance.   5. Optimal Fluoroscopic Angle for Delivery: LAO 15 CRA 5   6. Large hiatal hernia.   [place] T. Audie Box, MD     Electronically Signed   By: Eleonore Chiquito M.D.   On: 03/02/2021 17:46    Addended by Geralynn Rile, MD on 03/02/2021  5:48 PM   Study Result  Narrative & Impression  EXAM: OVER-READ INTERPRETATION  CT CHEST   The following report is an over-read performed by radiologist Dr. Yetta Glassman of [facility], [place] on 03/02/2021. This over-read does not include interpretation of cardiac or coronary anatomy or pathology. The coronary calcium score/coronary CTA interpretation by the cardiologist is attached.   COMPARISON:  None.   FINDINGS: Extracardiac findings will be described separately under dictation for contemporaneously  obtained CTA chest, abdomen and pelvis.   IMPRESSION: Please see separate dictation for contemporaneously obtained CTA chest, abdomen and pelvis dated 03/02/2021 for full description of relevant extracardiac findings.   Electronically Signed: By: Yetta Glassman M.D. On: 03/02/2021 13:27      Narrative & Impression  CLINICAL DATA:  Preop evaluation for transcatheter aortic valve replacement   EXAM: CTA ABDOMEN AND PELVIS WITHOUT AND WITH CONTRAST   TECHNIQUE: Multidetector CT imaging of the abdomen and pelvis was performed using the standard protocol during bolus administration of intravenous contrast. Multiplanar reconstructed images and MIPs were obtained and reviewed to evaluate the vascular anatomy.   RADIATION DOSE REDUCTION: This exam was performed according to the departmental dose-optimization program which includes automated exposure control, adjustment of the mA and/or kV according to patient size and/or use of iterative reconstruction technique.   CONTRAST:  3mL OMNIPAQUE IOHEXOL 350 MG/ML SOLN   COMPARISON:  None.   FINDINGS: CTA CHEST FINDINGS   Cardiovascular: Normal heart size. No significant pericardial effusion/thickening. Mild coronary artery calcifications of the LAD. Great vessels are normal in course and caliber. No central pulmonary emboli.   Mediastinum/Nodes: No discrete thyroid nodules. Large hiatal hernia. No pathologically enlarged axillary, mediastinal or hilar lymph nodes.   Lungs/Pleura: No pneumothorax. No pleural effusion. Small solid pulmonary nodule the left upper lobe measuring 4 mm on series 5, image 55. Mild  bibasilar atelectasis adjacent to large hiatal hernia.   Musculoskeletal: No aggressive appearing focal osseous lesions. Mild compression deformity of T11.   CTA ABDOMEN AND PELVIS FINDINGS   Hepatobiliary: Normal liver with no liver mass. Surgically absent gallbladder dilated common bile duct, measuring up to 1.1  cm. No biliary ductal dilatation.   Pancreas: No ductal dilation. Cystic lesion of the head/uncinate process measuring up to 1.3 cm on series 4 image 113.   Spleen: Normal size. No mass. Peripheral wedge-shaped areas of hypoattenuation.   Adrenals/Urinary Tract: Normal adrenals. Kidneys enhance symmetrically with no evidence of hydronephrosis or nephrolithiasis. Normal bladder.   Stomach/Bowel: Normal non-distended stomach. Normal caliber small bowel with no small bowel wall thickening. Mild sigmoid diverticulosis. No large bowel wall thickening or pericolonic fat stranding.   Vascular/Lymphatic: Moderate atherosclerotic disease the abdominal aorta consisting of calcified and noncalcified plaque. Major aortic branch vessels are patent. Mild narrowing at the origin of the celiac artery due to diaphragmatic compression. No pathologically enlarged lymph nodes in the abdomen or pelvis.   Reproductive: Uterus is present.  No adnexal masses.   Other: No pneumoperitoneum, ascites or focal fluid collection.   Musculoskeletal: No aggressive appearing focal osseous lesions.   VASCULAR MEASUREMENTS PERTINENT TO TAVR:   AORTA:   Minimal Aortic Diameter-13.7 mm   Severity of Aortic Calcification-mild   RIGHT PELVIS:   Right Common Iliac Artery -   Minimal Diameter-8.8 mm   Tortuosity-none   Calcification-mild   Right External Iliac Artery -   Minimal Diameter-6.6 mm   Tortuosity-mild   Calcification-mild   Right Common Femoral Artery -   Minimal Diameter-6.9 mm   Tortuosity-none   Calcification-mild   LEFT PELVIS:   Left Common Iliac Artery -   Minimal Diameter-9.2 mm   Tortuosity-none   Calcification-mild   Left External Iliac Artery -   Minimal Diameter-8.9 mm   Tortuosity-mild   Calcification-mild   Left Common Femoral Artery -   Minimal Diameter-6.8 mm   Tortuosity-none   Calcification-mild   Review of the MIP images confirms the above  findings.   IMPRESSION: Vascular:   1. Vascular findings and measurements pertinent to potential TAVR procedure, as detailed above. 2. Severe thickening calcification of the aortic valve, compatible with reported clinical history of severe aortic stenosis. 3. Moderate aortoiliac atherosclerosis. LAD coronary artery disease.   Nonvascular:   1. 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 2. Small solid pulmonary nodule the left upper lobe measuring 4 mm. No follow-up needed if patient is low-risk. Non-contrast chest CT can be considered in 12 months if patient is high-risk. This recommendation follows the consensus statement: Guidelines for Management of Incidental Pulmonary Nodules Detected on CT Images: From the Fleischner Society 2017; Radiology 2017; 284:228-243. 3. Dilated common bile duct which measures up to 1.1 cm, slightly greater than expected status post cholecystectomy. Recommend correlation with liver function tests. If LFTs are abnormal, consider MRCP. 4. Cystic lesion of the pancreatic head/uncinate process measuring up to 1.3 cm. Recommend follow up pre and post contrast MRI/MRCP or pancreatic protocol CT in 2 years. This recommendation follows ACR consensus guidelines: Management of Incidental Pancreatic Cysts: A White Paper of the ACR Incidental Findings Committee. [employer] [id number].     Electronically Signed   By: Yetta Glassman M.D.   On: 03/02/2021 14:14      STS RISK CALCULATOR:  Risk of Mortality:  2.181%  Renal Failure:  2.608%  Permanent Stroke:  2.057%  Prolonged Ventilation:  5.481%  DSW Infection:  0.092%  Reoperation:  3.071%  Morbidity or Mortality:  9.339%  Short Length of Stay:  33.726%  Long Length of Stay:  5.007%    Impression:  This 76 year old woman has stage D, severe, symptomatic low-flow/low gradient aortic stenosis with New York Heart Association  class II-lll symptoms of exertional fatigue and shortness of breath consistent with chronic diastolic congestive heart failure.  She has also had recurrent episodes of dizziness with a couple falls over the last few months.  I have personally reviewed her 2D echocardiogram, cardiac catheterization, and CTA studies.  Her echo in October 2022 showed a severely calcified aortic valve with thickened leaflets and poor mobility.  The mean gradient was measured at 25 mmHg with an aortic valve area of 0.71 cm and dimensionless index of 0.25.  Her valve looks severely stenotic.  Stroke-volume index was low at 28.  Left ventricular systolic function was normal.  Cardiac catheterization showed no coronary disease.  I agree that aortic valve replacement is indicated in this patient for relief of her symptoms and to prevent progressive left ventricular deterioration.  Her gated cardiac CTA shows anatomy suitable for TAVR using a SAPIEN 3 or Medtronic valve.  Her gated cardiac CTA shows anatomy suitable for transfemoral insertion.  The patient and her daughter were counseled at length regarding treatment alternatives for management of severe symptomatic aortic stenosis. The risks and benefits of surgical intervention has been discussed in detail. Long-term prognosis with medical therapy was discussed. Alternative approaches such as conventional surgical aortic valve replacement, transcatheter aortic valve replacement, and palliative medical therapy were compared and contrasted at length. This discussion was placed in the context of the patient's own specific clinical presentation and past medical history. All of their questions have been addressed.   Following the decision to proceed with transcatheter aortic valve replacement, a discussion was held regarding what types of management strategies would be attempted intraoperatively in the event of life-threatening complications, including whether or not the patient would be  considered a candidate for the use of cardiopulmonary bypass and/or conversion to open sternotomy for attempted surgical intervention.  I think she would be a candidate for emergent sternotomy to manage any intraoperative complications. The patient is aware of the fact that transient use of cardiopulmonary bypass may be necessary. The patient has been advised of a variety of complications that might develop including but not limited to risks of death, stroke, paravalvular leak, aortic dissection or other major vascular complications, aortic annulus rupture, device embolization, cardiac rupture or perforation, mitral regurgitation, acute myocardial infarction, arrhythmia, heart block or bradycardia requiring permanent pacemaker placement, congestive heart failure, respiratory failure, renal failure, pneumonia, infection, other late complications related to structural valve deterioration or migration, or other complications that might ultimately cause a temporary or permanent loss of functional independence or other long term morbidity. The patient provides full informed consent for the procedure as described and all questions were answered.    Plan:  Her cardiac anatomy will be reviewed at our multidisciplinary heart valve meeting and then we will decide whether to use a SAPIEN 3 or Medtronic valve.  I spent 60 minutes performing this consultation and > 50% of this time was spent face to face counseling and coordinating the care of this patient's severe symptomatic aortic stenosis.   Gaye Pollack, MD 03/08/2021 4:15 PM

## 2021-03-09 ENCOUNTER — Ambulatory Visit: Payer: Medicare PPO | Admitting: Internal Medicine

## 2021-03-09 ENCOUNTER — Other Ambulatory Visit: Payer: Self-pay

## 2021-03-09 DIAGNOSIS — I350 Nonrheumatic aortic (valve) stenosis: Secondary | ICD-10-CM

## 2021-03-11 NOTE — Progress Notes (Signed)
Surgical Instructions   Your procedure is scheduled on Tuesday 03/16/2021.  Report to [facility] Main Entrance "A" at 05:30 A.M., then check in with the Admitting office.  Call [phone] if you have problems or questions between now and the morning of surgery:   Remember: Do not eat or drink after midnight the night before your surgery  STOP now taking any Aspirin (unless otherwise instructed by your surgeon), Aleve, Naproxen, Ibuprofen, Motrin, Advil, Goody's, BC's, all herbal medications, fish oil, and all non-prescription vitamins.   Stop taking Metformin on 1/29 (Sunday). You will take your last dose on 1/28 (Saturday).   Continue taking all other medications without change through the day before surgery.  On the morning of surgery do not take any medications    After your pre-procedure COVID test  You are not required to quarantine however you are required to wear a well-fitting mask when you are out and around people not in your household.  If your mask becomes wet or soiled, replace with a new one.  Wash your hands often with soap and water for 20 seconds or clean your hands with an alcohol-based hand sanitizer that contains at least 60% alcohol.  Do not share personal items.  Notify your provider: if you are in close contact with someone who has COVID  or if you develop a fever of 100.4 or greater, sneezing, cough, sore throat, shortness of breath or body aches.          Do not wear jewelry or makeup  Do not wear lotions, powders, perfumes/colognes, or deodorant.  Do not shave 48 hours prior to surgery.  Men may shave face and neck.  Do not wear nail polish, gel polish, artificial nails, or any other type of covering on natural nails including fingernails and toenails. If patients have artificial nails, gel coating, etc. that need to be removed by a nail salon please have this removed prior to surgery or surgery may need to be canceled/delayed if the surgeon/  anesthesia feels like the patient is unable to be adequately monitored.  Do not bring valuables to the hospital - [facility] is not responsible for any belongings or valuables.  Do NOT Smoke (Tobacco/Vaping) or drink Alcohol 24 hours prior to your procedure  If you use a CPAP at night, please bring your mask for your overnight stay.   Contacts, glasses, hearing aids, dentures or partials may not be worn into surgery, please bring cases for these belongings   For patients admitted to the hospital, discharge time will be determined by your treatment team.   Patients discharged the day of surgery will not be allowed to drive home, and someone needs to stay with them for 24 hours.  NO VISITORS WILL BE ALLOWED IN PRE-OP WHERE PATIENTS ARE PREPPED FOR SURGERY.  ONLY 1 SUPPORT PERSON MAY BE PRESENT IN THE WAITING ROOM WHILE YOU ARE IN SURGERY.  IF YOU ARE TO BE ADMITTED, ONCE YOU ARE IN YOUR ROOM YOU WILL BE ALLOWED TWO (2) VISITORS. 1 (ONE) VISITOR MAY STAY OVERNIGHT BUT MUST ARRIVE TO THE ROOM BY 8pm.  Minor children may have two parents present. Special consideration for safety and communication needs will be reviewed on a case by case basis.  Special instructions:    Oral Hygiene is also important to reduce your risk of infection.  Remember - BRUSH YOUR TEETH THE MORNING OF SURGERY WITH YOUR REGULAR TOOTHPASTE   [place]- Preparing For Surgery  Before surgery, you can  play an important role. Because skin is not sterile, your skin needs to be as free of germs as possible. You can reduce the number of germs on your skin by washing with CHG (chlorahexidine gluconate) Soap before surgery.  CHG is an antiseptic cleaner which kills germs and bonds with the skin to continue killing germs even after washing.     Please do not use if you have an allergy to CHG or antibacterial soaps. If your skin becomes reddened/irritated stop using the CHG.  Do not shave (including legs and underarms) for at  least 48 hours prior to first CHG shower. It is OK to shave your face.  Please follow these instructions carefully.     Shower the NIGHT BEFORE SURGERY and the MORNING OF SURGERY with CHG Soap.   If you chose to wash your hair, wash your hair first as usual with your normal shampoo. After you shampoo, rinse your hair and body thoroughly to remove the shampoo.    Then [employer] and genitals (private parts) with your normal soap and rinse thoroughly to remove soap.  Next use the CHG Soap as you would any other liquid soap. You can apply CHG directly to the skin and wash gently with a clean washcloth.   Apply the CHG Soap to your body ONLY FROM THE NECK DOWN.  Do not use on open wounds or open sores. Avoid contact with your eyes, ears, mouth and genitals (private parts). Wash Face and genitals (private parts)  with your normal soap.   Wash thoroughly, paying special attention to the area where your surgery will be performed.  Thoroughly rinse your body with warm water from the neck down.  DO NOT shower/wash with your normal soap after using and rinsing off the CHG Soap.  Pat yourself dry with a CLEAN TOWEL.  Wear CLEAN PAJAMAS to bed the night before surgery  Place CLEAN SHEETS on your bed the night before your surgery  DO NOT SLEEP WITH PETS.   Day of Surgery:  Take a shower with CHG soap. Wear Clean/Comfortable clothing the morning of surgery Do not apply any deodorants/lotions.   Remember to brush your teeth WITH YOUR REGULAR TOOTHPASTE.   Please read over the fact sheets that you were given.

## 2021-03-12 ENCOUNTER — Encounter (HOSPITAL_COMMUNITY)
Admission: RE | Admit: 2021-03-12 | Discharge: 2021-03-12 | Disposition: A | Discharge status: Home / Self Care | Payer: Medicare PPO | Source: Ambulatory Visit | Attending: Internal Medicine | Admitting: Internal Medicine

## 2021-03-12 ENCOUNTER — Ambulatory Visit (HOSPITAL_COMMUNITY)
Admission: RE | Admit: 2021-03-12 | Discharge: 2021-03-12 | Disposition: A | Discharge status: Home / Self Care | Payer: Medicare PPO | Source: Ambulatory Visit | Attending: Internal Medicine | Admitting: Internal Medicine

## 2021-03-12 ENCOUNTER — Other Ambulatory Visit: Payer: Self-pay

## 2021-03-12 ENCOUNTER — Encounter (HOSPITAL_COMMUNITY): Payer: Self-pay

## 2021-03-12 VITALS — BP 149/91 | HR 68 | Temp 97.4°F | Resp 18 | Ht 64.0 in | Wt 183.7 lb

## 2021-03-12 DIAGNOSIS — Z01818 Encounter for other preprocedural examination: Secondary | ICD-10-CM | POA: Insufficient documentation

## 2021-03-12 DIAGNOSIS — Z20822 Contact with and (suspected) exposure to covid-19: Secondary | ICD-10-CM | POA: Insufficient documentation

## 2021-03-12 DIAGNOSIS — I350 Nonrheumatic aortic (valve) stenosis: Secondary | ICD-10-CM

## 2021-03-12 HISTORY — DX: Pneumonia, unspecified organism: J18.9

## 2021-03-12 LAB — BRAIN NATRIURETIC PEPTIDE: B Natriuretic Peptide: 155.7 pg/mL — ABNORMAL HIGH (ref 0.0–100.0)

## 2021-03-12 LAB — URINALYSIS, ROUTINE W REFLEX MICROSCOPIC
Bacteria, UA: NONE SEEN
Bilirubin Urine: NEGATIVE
Glucose, UA: NEGATIVE mg/dL
Hgb urine dipstick: NEGATIVE
Ketones, ur: NEGATIVE mg/dL
Leukocytes,Ua: NEGATIVE
Nitrite: NEGATIVE
Protein, ur: NEGATIVE mg/dL
Specific Gravity, Urine: 1.004 — ABNORMAL LOW (ref 1.005–1.030)
pH: 6 (ref 5.0–8.0)

## 2021-03-12 LAB — CBC
HCT: 39.2 % (ref 36.0–46.0)
Hemoglobin: 12.4 g/dL (ref 12.0–15.0)
MCH: 29.7 pg (ref 26.0–34.0)
MCHC: 31.6 g/dL (ref 30.0–36.0)
MCV: 93.8 fL (ref 80.0–100.0)
Platelets: 340 10*3/uL (ref 150–400)
RBC: 4.18 MIL/uL (ref 3.87–5.11)
RDW: 13.4 % (ref 11.5–15.5)
WBC: 6.2 10*3/uL (ref 4.0–10.5)
nRBC: 0 % (ref 0.0–0.2)

## 2021-03-12 LAB — COMPREHENSIVE METABOLIC PANEL
ALT: 9 U/L (ref 0–44)
AST: 13 U/L — ABNORMAL LOW (ref 15–41)
Albumin: 3.6 g/dL (ref 3.5–5.0)
Alkaline Phosphatase: 56 U/L (ref 38–126)
Anion gap: 8 (ref 5–15)
BUN: 16 mg/dL (ref 8–23)
CO2: 24 mmol/L (ref 22–32)
Calcium: 10.1 mg/dL (ref 8.9–10.3)
Chloride: 104 mmol/L (ref 98–111)
Creatinine, Ser: 1.27 mg/dL — ABNORMAL HIGH (ref 0.44–1.00)
GFR, Estimated: 44 mL/min — ABNORMAL LOW (ref >60)
Glucose, Bld: 92 mg/dL (ref 70–99)
Potassium: 4.5 mmol/L (ref 3.5–5.1)
Sodium: 136 mmol/L (ref 135–145)
Total Bilirubin: 0.6 mg/dL (ref 0.3–1.2)
Total Protein: 6.6 g/dL (ref 6.5–8.1)

## 2021-03-12 LAB — BLOOD GAS, ARTERIAL
Acid-Base Excess: 1 mmol/L (ref 0.0–2.0)
Bicarbonate: 25.3 mmol/L (ref 20.0–28.0)
FIO2: 21
O2 Saturation: 97.6 %
Patient temperature: 37
pCO2 arterial: 41.5 mmHg (ref 32.0–48.0)
pH, Arterial: 7.402 (ref 7.350–7.450)
pO2, Arterial: 98 mmHg (ref 83.0–108.0)

## 2021-03-12 LAB — SURGICAL PCR SCREEN
MRSA, PCR: NEGATIVE
Staphylococcus aureus: NEGATIVE

## 2021-03-12 LAB — TYPE AND SCREEN
ABO/RH(D): O POS
Antibody Screen: NEGATIVE

## 2021-03-12 LAB — SARS CORONAVIRUS 2 (TAT 6-24 HRS): SARS Coronavirus 2: NEGATIVE

## 2021-03-12 LAB — PROTIME-INR
INR: 1.1 (ref 0.8–1.2)
Prothrombin Time: 13.8 seconds (ref 11.4–15.2)

## 2021-03-12 NOTE — Progress Notes (Signed)
PCP - Kathyrn Lass Cardiologist - Fransico Him  PPM/ICD - n/a Device Orders -  Rep Notified -   Chest x-ray - 03/12/21 EKG - 03/12/21 Stress Test - 10/29/15 ECHO - 12/03/20 Cardiac Cath - 02/17/21  Sleep Study - n/a CPAP -   Fasting Blood Sugar - n/a, patient takes metformin for weight loss Checks Blood Sugar _____ times a day  Blood Thinner Instructions: n/a Aspirin Instructions:continue and not take DOS  ERAS Protcol - n/a, NPO after midnight per order PRE-SURGERY Ensure or G2-   COVID TEST- at PAT appointment   Anesthesia review: yes, heart history  Patient denies shortness of breath, fever, cough and chest pain at PAT appointment   All instructions explained to the patient, with a verbal understanding of the material. Patient agrees to go over the instructions while at home for a better understanding. Patient also instructed to self quarantine after being tested for COVID-19. The opportunity to ask questions was provided.

## 2021-03-15 MED ORDER — HEPARIN 30,000 UNITS/1000 ML (OHS) CELLSAVER SOLUTION
Status: DC
  Filled 2021-03-15: qty 1000

## 2021-03-15 MED ORDER — CEFAZOLIN SODIUM-DEXTROSE 2-4 GM/100ML-% IV SOLN
2.0000 g | INTRAVENOUS | Status: AC
  Administered 2021-03-16: 2 g via INTRAVENOUS
  Filled 2021-03-15: qty 100

## 2021-03-15 MED ORDER — MAGNESIUM SULFATE 50 % IJ SOLN
40.0000 meq | INTRAMUSCULAR | Status: DC
  Filled 2021-03-15: qty 9.85

## 2021-03-15 MED ORDER — NOREPINEPHRINE 4 MG/250ML-% IV SOLN
0.0000 ug/min | INTRAVENOUS | Status: AC
  Administered 2021-03-16: 1 ug/min via INTRAVENOUS
  Filled 2021-03-15: qty 250

## 2021-03-15 MED ORDER — POTASSIUM CHLORIDE 2 MEQ/ML IV SOLN
80.0000 meq | INTRAVENOUS | Status: DC
  Filled 2021-03-15: qty 40

## 2021-03-15 MED ORDER — DEXMEDETOMIDINE HCL IN NACL 400 MCG/100ML IV SOLN
0.1000 ug/kg/h | INTRAVENOUS | Status: AC
  Administered 2021-03-16: 1 ug/kg/h via INTRAVENOUS
  Filled 2021-03-15: qty 100

## 2021-03-15 NOTE — H&P (Addendum)
[address]       [place] [address]             [phone]      Cardiothoracic Surgery Admission History and Physical  PCP is [name], MD  Referring Provider is Alverda Skeans, MD  Primary Cardiologist is [name], MD   Reason for admission: Severe aortic stenosis   HPI:  The patient is an 76 year old woman with a history of paroxysmal atrial tachycardia, degenerative arthritis status post bilateral total knee replacement, and aortic stenosis that has been followed by periodic echocardiograms. She is here today with her daughter. She reports at least a 1 year history of tiredness and exertional fatigue and probably 6 months of progressive exertional shortness of breath. She said this typically happens with doing any heavy work or walking up stairs or hills. She has also had some episodes of dizziness particularly with bending over and says she has had a couple falls over the past few months. She denies any orthopnea. She has had no peripheral edema. She denies any chest pain or pressure. Her most recent echo on 12/03/2020 showed a calcified aortic valve with restricted mobility. The mean gradient was measured at 25 mmHg with a peak gradient of 39 mmHg. Aortic valve area was 0.71 cm with a dimensionless index of 0.25. Stroke-volume index was 28. Left ventricular ejection fraction was 65 to 70% with grade 1 diastolic dysfunction. There is trivial aortic insufficiency. Cardiac catheterization on 02/17/2021 showed normal right dominant coronary circulation. Filling pressures were normal with a cardiac index of 4.0.  She is divorced and lives with her daughter. She is a lifelong non-smoker. She sees her dentist regularly.      Past Medical History:  Diagnosis Date   Anemia    Aortic stenosis    moderate AS with mean AVG by echo 01/2020   Arthritis    Knees, neck, back   Chronic lower back pain    with siatica   Colon polyp 1999   Depression    GERD  (gastroesophageal reflux disease)    Mild aortic insufficiency    Mild mitral regurgitation    Orthostatic hypotension    PAT (paroxysmal atrial tachycardia) (HCC)    PONV (postoperative nausea and vomiting)    SVT (supraventricular tachycardia) (HCC)    TR (tricuspid regurgitation) 09/19/2016   moderate by echo 01/2020        Past Surgical History:  Procedure Laterality Date   CESAREAN SECTION  1964   X2   CHOLECYSTECTOMY     COSMETIC SURGERY  1980   removal extra fat inner thighs   EYE SURGERY Bilateral    Cataract extraction with IOL   RIGHT HEART CATH AND CORONARY ANGIOGRAPHY N/A 02/17/2021   Procedure: RIGHT HEART CATH AND CORONARY ANGIOGRAPHY; Surgeon: Orbie Pyo, MD; Location: [facility]; Service: Cardiovascular; Laterality: N/A;   TOTAL KNEE ARTHROPLASTY Right 11/30/2015   Procedure: RIGHT TOTAL KNEE ARTHROPLASTY; Surgeon: Ollen Gross, MD; Location: [facility]; Service: Orthopedics; Laterality: Right;   TOTAL KNEE ARTHROPLASTY Left 11/14/2016   Procedure: LEFT TOTAL KNEE ARTHROPLASTY; Surgeon: Ollen Gross, MD; Location: [facility]; Service: Orthopedics; Laterality: Left;        Family History  Problem Relation Age of Onset   Colon cancer Mother    Heart attack Brother    Hypertension Brother    Stroke Neg Hx    Social History        Socioeconomic  History   Marital status: Divorced    Spouse name: Not on file   Number of children: Not on file   Years of education: Not on file   Highest education level: Not on file  Occupational History   Not on file  Tobacco Use   Smoking status: Never   Smokeless tobacco: Never  Substance and Sexual Activity   Alcohol use: No   Drug use: No   Sexual activity: Not on file  Other Topics Concern   Not on file  Social History Narrative   Not on file   Social Determinants of Health   Financial Resource Strain: Not on file  Food Insecurity: Not on file  Transportation Needs: Not on file  Physical Activity: Not  on file  Stress: Not on file  Social Connections: Not on file  Intimate Partner Violence: Not on file          Prior to Admission medications   Medication Sig Start Date End Date Taking? Authorizing Provider  acetaminophen (TYLENOL) 650 MG CR tablet Take 1,300 mg by mouth daily.   Yes [provider]  alendronate (FOSAMAX) 70 MG tablet Take 1 tablet by mouth once a week.   Yes [provider]  atorvastatin (LIPITOR) 10 MG tablet Take 10 mg by mouth at bedtime.  02/11/15  Yes [provider]  buPROPion (WELLBUTRIN XL) 300 MG 24 hr tablet Take 300 mg by mouth daily after breakfast.   Yes [provider]  calcium carbonate (OS-CAL) 600 MG TABS tablet Take 600 mg by mouth 2 (two) times daily with a meal.   Yes [provider]  Cholecalciferol (VITAMIN D3) 50 MCG (2000 UT) CAPS Take 4,000 capsules by mouth daily. 02/09/16  Yes [provider]  cyclobenzaprine (FLEXERIL) 10 MG tablet Take 10 mg by mouth at bedtime.   Yes [provider]  DULoxetine (CYMBALTA) 30 MG capsule Take 90 mg by mouth daily after breakfast.   Yes [provider]  gabapentin (NEURONTIN) 300 MG capsule Take 300 mg by mouth 2 (two) times daily. 12/20/19  Yes [provider]  Iron, Ferrous Sulfate, 325 (65 Fe) MG TABS Take 325 mg by mouth daily. 12/16/20  Yes [provider]  Lurasidone HCl 60 MG TABS Take 60 mg by mouth daily with supper. [name]   Yes [provider]  metFORMIN (GLUCOPHAGE) 500 MG tablet Take 500 mg by mouth 2 (two) times daily. 11/04/17  Yes [provider]  metoprolol succinate (TOPROL-XL) 25 MG 24 hr tablet TAKE 1 TABLET(25 MG) BY MOUTH IN THE MORNING AND AT BEDTIME 01/26/21  Yes Dunn, Dayna N, PA-C  traMADol (ULTRAM) 50 MG tablet Take 50 mg by mouth at bedtime.   Yes [provider]  vitamin B-12 (CYANOCOBALAMIN) 1000 MCG tablet Take 1,000 mcg by mouth daily.   Yes [provider]          Current Outpatient Medications  Medication Sig Dispense Refill   acetaminophen (TYLENOL) 650 MG CR tablet Take 1,300 mg by mouth daily.     alendronate (FOSAMAX) 70 MG tablet Take 1 tablet by mouth once a week.     atorvastatin (LIPITOR) 10 MG tablet Take 10 mg by mouth at bedtime.      buPROPion (WELLBUTRIN XL) 300 MG 24 hr tablet Take 300 mg by mouth daily after breakfast.     calcium carbonate (OS-CAL) 600 MG TABS tablet Take 600 mg by mouth 2 (two) times daily with a meal.  Cholecalciferol (VITAMIN D3) 50 MCG (2000 UT) CAPS Take 4,000 capsules by mouth daily.     cyclobenzaprine (FLEXERIL) 10 MG tablet Take 10 mg by mouth at bedtime.     DULoxetine (CYMBALTA) 30 MG capsule Take 90 mg by mouth daily after breakfast.     gabapentin (NEURONTIN) 300 MG capsule Take 300 mg by mouth 2 (two) times daily.     Iron, Ferrous Sulfate, 325 (65 Fe) MG TABS Take 325 mg by mouth daily.     Lurasidone HCl 60 MG TABS Take 60 mg by mouth daily with supper. [name]     metFORMIN (GLUCOPHAGE) 500 MG tablet Take 500 mg by mouth 2 (two) times daily.  3   metoprolol succinate (TOPROL-XL) 25 MG 24 hr tablet TAKE 1 TABLET(25 MG) BY MOUTH IN THE MORNING AND AT BEDTIME 180 tablet 1   traMADol (ULTRAM) 50 MG tablet Take 50 mg by mouth at bedtime.     vitamin B-12 (CYANOCOBALAMIN) 1000 MCG tablet Take 1,000 mcg by mouth daily.              Current Facility-Administered Medications  Medication Dose Route Frequency Provider Last Rate Last Admin   sodium chloride flush (NS) 0.9 % injection 3 mL 3 mL Intravenous Q12H Early Osmond, MD          Allergies  Allergen Reactions   Sulfa Antibiotics Nausea And Vomiting and Other (See Comments)   Tizanidine Hcl Other (See Comments)    Other reaction(s): Unknown   Review of Systems:  General: normal appetite, + decreased energy, no weight gain, no weight loss, no fever  Cardiac: no chest pain with exertion, no chest pain at rest, +SOB with moderate exertion, no  resting SOB, no PND, no orthopnea, + palpitations, + arrhythmia, + atrial fibrillation, no LE edema, + dizzy spells, no syncope  Respiratory: + exertional shortness of breath, no home oxygen, no productive cough, + dry cough, no bronchitis, no wheezing, no hemoptysis, no asthma, no pain with inspiration or cough, no sleep apnea, no CPAP at night  GI: no difficulty swallowing, + reflux, no frequent heartburn, + hiatal hernia, no abdominal pain, + constipation, no diarrhea, no hematochezia, no hematemesis, no melena  GU: no dysuria, no frequency, no urinary tract infection, no hematuria, no kidney stones, + kidney disease  Vascular: no pain suggestive of claudication, no pain in feet, no leg cramps, no varicose veins, no DVT, no non-healing foot ulcer  Neuro: no stroke, no TIA's, no seizures, no headaches, no temporary blindness one eye, no slurred speech, no peripheral neuropathy, no chronic pain, no instability of gait, no memory/cognitive dysfunction  Musculoskeletal: + arthritis, no joint swelling, no myalgias, no difficulty walking, normal mobility  Skin: no rash, no itching, no skin infections, no pressure sores or ulcerations  Psych: no anxiety, + depression, no nervousness, no unusual recent stress  Eyes: no blurry vision, no floaters, no recent vision changes, no glasses or contacts  ENT: no hearing loss, no loose or painful teeth, no dentures, last saw dentist 2023  Hematologic: + easy bruising, no abnormal bleeding, no clotting disorder, no frequent epistaxis  Endocrine: no diabetes, does not check CBG's at home  Physical Exam:  BP 131/85 (BP Location: Left Arm, Patient Position: Sitting, Cuff Size: Normal)   Pulse 74   Resp 20   Ht 5\' 3"  (1.6 m)   Wt 180 lb (81.6 kg)   SpO2 94% Comment: RA   BMI 31.89 kg/m  General: well-appearing  HEENT: Unremarkable, NCAT, PERLA, EOMI  Neck: no JVD, no bruits, no adenopathy  Chest: clear to auscultation, symmetrical breath sounds, no wheezes, no  rhonchi  CV: RRR, 3/6 systolic murmur RSB  Abdomen: soft, non-tender, no masses  Extremities: warm, well-perfused, pulses palpable at ankle, no lower extremity edema  Rectal/GU Deferred  Neuro: Grossly non-focal and symmetrical throughout  Skin: Clean and dry, no rashes, no breakdown  Diagnostic Tests:  ECHOCARDIOGRAM REPORT     Patient Name: NICOLETT VORHIES [facility] Date of Exam: 12/03/2020  Medical Rec #: [id number] Height: 63.0 in  Accession #: [id number] Weight: 187.2 lb  Date of Birth: 1946/02/14 BSA: 1.880 m  Patient Age: 79 years BP: 120/82 mmHg  Patient Gender: F HR: 82 bpm.  Exam Location: [address]   Procedure: 2D Echo, Cardiac Doppler and Color Doppler   Indications: I35.0 Aortic Stenosis   History: Patient has prior history of Echocardiogram examinations,  most  recent 01/14/2020. SVT.   Sonographer: Wilford Sports Rodgers-Jones RDCS  Referring Phys: 48 [address]    1. Left ventricular ejection fraction, by estimation, is 65 to 70%. The  left ventricle has normal function. The left ventricle has no regional  wall motion abnormalities. There is moderate left ventricular hypertrophy.  Left ventricular diastolic  parameters are consistent with Grade I diastolic dysfunction (impaired  relaxation).  2. Right ventricular systolic function is normal. The right ventricular  size is normal.  3. Left atrial size was mildly dilated.  4. The mitral valve is abnormal. Mild mitral valve regurgitation.  5. The aortic valve is tricuspid. Aortic valve regurgitation is trivial.  Moderate to severe aortic valve stenosis. Aortic regurgitation PHT  measures 439 msec. Aortic valve area, by VTI measures 0.71 cm. Aortic  valve mean gradient measures 25.0 mmHg.  Aortic valve Vmax measures 3.14 m/s. DI is 0.25.   Comparison(s): Changes from prior study are noted. 01/14/2020: LVEF  65-70%, mild to moderate AS - mean gradient 26 mmHg.   FINDINGS  Left Ventricle: Left  ventricular ejection fraction, by estimation, is 65  to 70%. The left ventricle has normal function. The left ventricle has no  regional wall motion abnormalities. The left ventricular internal cavity  size was normal in size. There is  moderate left ventricular hypertrophy. Left ventricular diastolic  parameters are consistent with Grade I diastolic dysfunction (impaired  relaxation). Indeterminate filling pressures.   Right Ventricle: The right ventricular size is normal. No increase in  right ventricular wall thickness. Right ventricular systolic function is  normal.   Left Atrium: Left atrial size was mildly dilated.   Right Atrium: Right atrial size was normal in size.   Pericardium: There is no evidence of pericardial effusion.   Mitral Valve: The mitral valve is abnormal. There is mild calcification of  the mitral valve leaflet(s). Mild mitral valve regurgitation.   Tricuspid Valve: The tricuspid valve is grossly normal. Tricuspid valve  regurgitation is mild.   Aortic Valve: The aortic valve is tricuspid. Aortic valve regurgitation is  trivial. Aortic regurgitation PHT measures 439 msec. Moderate to severe  aortic stenosis is present. Aortic valve mean gradient measures 25.0 mmHg.  Aortic valve peak gradient  measures 39.4 mmHg. Aortic valve area, by VTI measures 0.71 cm.   Pulmonic Valve: The pulmonic valve was grossly normal. Pulmonic valve  regurgitation is mild.   Aorta: The aortic root and ascending aorta are structurally normal, with  no evidence of dilitation.   IAS/Shunts: No atrial level shunt  detected by color flow Doppler.    LEFT VENTRICLE  PLAX 2D  LVIDd: 3.20 cm Diastology  LVIDs: 2.30 cm LV e' medial: 5.22 cm/s  LV PW: 1.30 cm LV E/e' medial: 12.4  LV IVS: 1.30 cm LV e' lateral: 7.07 cm/s  LVOT diam: 1.90 cm LV E/e' lateral: 9.1  LV SV: 52  LV SV Index: 28  LVOT Area: 2.84 cm    RIGHT VENTRICLE  RV Basal diam: 4.20 cm  RV S prime: 11.80  cm/s   LEFT ATRIUM Index RIGHT ATRIUM Index  LA diam: 3.30 cm 1.76 cm/m RA Area: 10.80 cm  LA Vol (A2C): 25.7 ml 13.67 ml/m RA Volume: 24.80 ml 13.19 ml/m  LA Vol (A4C): 55.8 ml 29.68 ml/m  LA Biplane Vol: 41.3 ml 21.97 ml/m  AORTIC VALVE  AV Area (Vmax): 0.78 cm  AV Area (Vmean): 0.71 cm  AV Area (VTI): 0.71 cm  AV Vmax: 314.00 cm/s  AV Vmean: 236.000 cm/s  AV VTI: 0.730 m  AV Peak Grad: 39.4 mmHg  AV Mean Grad: 25.0 mmHg  LVOT Vmax: 86.50 cm/s  LVOT Vmean: 58.700 cm/s  LVOT VTI: 0.183 m  LVOT/AV VTI ratio: 0.25  AI PHT: 439 msec   AORTA  Ao Root diam: 3.30 cm  Ao Asc diam: 3.70 cm   MITRAL VALVE TRICUSPID VALVE  MV Area (PHT): 3.65 cm TR Peak grad: 25.0 mmHg  MV Decel Time: 208 msec TR Vmax: 250.00 cm/s  MV E velocity: 64.50 cm/s  MV A velocity: 84.90 cm/s SHUNTS  MV E/A ratio: 0.76 Systemic VTI: 0.18 m  Systemic Diam: 1.90 cm   Lyman Bishop MD  Electronically signed by Lyman Bishop MD  Signature Date/Time: 12/03/2020/11:37:20 AM     Final  Physicians  Panel Physicians Referring Physician Case Authorizing Physician  Early Osmond, MD (Primary)    Procedures  RIGHT HEART CATH AND CORONARY ANGIOGRAPHY  Conclusion  1. Normal right dominant coronary artery circulation. 2. Mean RA of 2 mmHg and mean wedge of 6 mmHg with cardiac output of 7.4 L/min and cardiac index of 4.0 L/min/m  3. Right upper extremity tortuosity.  Indications  Nonrheumatic aortic valve stenosis [I35.0 (ICD-10-CM)]  Clinical Presentation  CHF/Shock Congestive heart failure not present. No shock present.  Procedural Details  Technical Details The patient is a 76 year old female with a history of paradoxical low-flow low gradient symptomatic aortic stenosis and paroxysmal atrial tachycardia was evaluated in outpatient setting and referred for preprocedural right heart catheterization and coronary angiography study.  After obtaining consent the patient was brought to the cardiac  catheterization laboratory prepped draped sterile fashion ultrasound was used to gain access to the right radial artery and a 6 French glide sheath placed there. 5mg  of verapamil and 5000 units of heparin were administered. A previously placed antecubital IV was exchanged for a 5 French femoral glide sheath. Right heart catheterization and coronary angiography study was then performed. At the conclusion of the procedure manual pressure was applied to the antecubital site and a TR band was placed on the radial site.   Estimated blood loss <50 mL.   During this procedure medications were administered to achieve and maintain moderate conscious sedation while the patient's heart rate, blood pressure, and oxygen saturation were continuously monitored and I was present face-to-face 100% of this time.  Medications  (Filter: Administrations occurring from [phone] to 1052 on 02/17/21)  Heparin (Porcine) in NaCl 1000-0.9 UT/500ML-% SOLN (mL)  Total volume: 1,000 mL  Date/Time Rate/Dose/Volume  Action   02/17/21 0958 500 mL Given   0958 500 mL Given   fentaNYL (SUBLIMAZE) injection (mcg)  Total dose: 25 mcg  Date/Time Rate/Dose/Volume Action   02/17/21 0958 25 mcg Given   midazolam (VERSED) injection (mg)  Total dose: 1 mg  Date/Time Rate/Dose/Volume Action   02/17/21 0959 1 mg Given   lidocaine (PF) (XYLOCAINE) 1 % injection (mL)  Total volume: 4 mL  Date/Time Rate/Dose/Volume Action   02/17/21 1005 2 mL Given   1007 2 mL Given   heparin sodium (porcine) injection (Units)  Total dose: 5,000 Units  Date/Time Rate/Dose/Volume Action   02/17/21 1008 5,000 Units Given   verapamil (ISOPTIN) injection (mg)  Total dose: 5 mg  Date/Time Rate/Dose/Volume Action   02/17/21 1008 5 mg Given   iohexol (OMNIPAQUE) 350 MG/ML injection (mL)  Total volume: 45 mL  Date/Time Rate/Dose/Volume Action   02/17/21 1032 45 mL Given   Sedation Time  Sedation Time Physician-1: 31 minutes 51 seconds  Contrast   Medication Name Total Dose  iohexol (OMNIPAQUE) 350 MG/ML injection 45 mL  Radiation/Fluoro  Fluoro time: 7.3 (min)  DAP: 9853 (mGycm2)  Cumulative Air Kerma: 0000000 (mGy)  Complications     Complications documented before study signed (02/17/2021 10:52 AM)    RIGHT HEART CATH AND CORONARY ANGIOGRAPHY   None Documented by Early Osmond, MD 02/17/2021 10:38 AM  Date Found: 02/17/2021  Time Range: Intraprocedure     Coronary Findings  Diagnostic  Dominance: Right  No diagnostic findings have been documented.  Intervention  No interventions have been documented.  Coronary Diagrams  Diagnostic  Dominance: Right  Intervention  Implants  No implant documentation for this case.   Syngo Images  Show images for CARDIAC CATHETERIZATION  Images on Long Term Storage  Show images for Hindy, [place] to Procedure Log    Procedure Log  Hemo Data  Flowsheet Row Most Recent Value  Fick Cardiac Output 7.44 L/min  Fick Cardiac Output Index 4.01 (L/min)/BSA  RA A Wave 10 mmHg  RA V Wave 3 mmHg  RA Mean 2 mmHg  RV Systolic Pressure 30 mmHg  RV Diastolic Pressure 1 mmHg  RV EDP 4 mmHg  PA Systolic Pressure 33 mmHg  PA Diastolic Pressure 10 mmHg  PA Mean 20 mmHg  PW A Wave 7 mmHg  PW V Wave 11 mmHg  PW Mean 6 mmHg  QP/QS 0.91  TPVR Index 4.98 HRUI   ADDENDUM REPORT: 03/02/2021 17:46  CLINICAL DATA: Severe Aortic Stenosis.  EXAM:  Cardiac TAVR CT  TECHNIQUE:  A non-contrast, gated CT scan was obtained with axial slices of 3 mm  through the heart for aortic valve calcium scoring. A 120 kV  retrospective, gated, contrast cardiac scan was obtained. Gantry  rotation speed was 250 msecs and collimation was 0.6 mm.  Nitroglycerin was not given. The 3D data set was reconstructed in 5%  intervals of the 0-95% of the R-R cycle. Systolic and diastolic  phases were analyzed on a dedicated workstation using MPR, MIP, and  VRT modes. The patient received 100 cc of contrast.  FINDINGS:   Image quality: Excellent.  Noise artifact is: Limited.  Valve Morphology: The aortic valve is tricuspid with bulky  calcification of the NCC. The leaflets demonstrate severely  restricted movement in systole.  Aortic Valve Calcium score: 1243  Aortic annular dimension:  Phase assessed: 35%  Annular area: 435 mm2  Annular perimeter: 25.9 mm  Max diameter: 25.9 mm  Min diameter: 22.1 mm  Annular and subannular calcification: Trivial calcification under  the [place].  Membranous septum length: 7.4 mm  Optimal coplanar projection: LAO 15 CRA 5  Coronary Artery Height above Annulus:  Left Main: 12.2 mm  Right Coronary:  Sinus of Valsalva Measurements:  Non-coronary: 29.1 mm  Right-coronary: 27.4 mm  Left-coronary: 31.3 mm  Sinus of Valsalva Height:  Non-coronary: 22.8 mm  Right-coronary: 17.5 mm  Left-coronary: 19.9 mm  Sinotubular Junction: 31 mm  Ascending Thoracic Aorta: 35 mm  Coronary Arteries: Normal coronary origin. Right dominance. The  study was performed without use of NTG and is insufficient for  plaque evaluation. Please refer to recent cardiac catheterization  for coronary assessment.  Cardiac Morphology:  Right Atrium: Right atrial size is within normal limits.  Right Ventricle: The right ventricular cavity is within normal  limits.  Left Atrium: Left atrial size is normal in size with no left atrial  appendage filling defect.  Left Ventricle: The ventricular cavity size is within normal limits.  There are no stigmata of prior infarction. There is no abnormal  filling defect. Normal left ventricular function, LVEF=68%. No  regional wall motion abnormalities.  Pulmonary arteries: Normal in size without proximal filling defect.  Pulmonary veins: Normal pulmonary venous drainage.  Pericardium: Normal thickness with no significant effusion or  calcium present.  Mitral Valve: The mitral valve is normal structure with mild annular  calcium.  Extra-cardiac findings:  Large hiatal hernia. See attached radiology  report for non-cardiac structures.  IMPRESSION:  1. Tricuspid aortic valve with bulky calcification of the NCC.  2. Annular measurements in between 23/26 mm S3 TAVR (435 mm2).  Measurements do support a 29 mm Evolut Pro. Would recommend  structural heart team discussion for valve size.  3. No significant annular or subannular calcifications.  4. Sufficient coronary to annulus distance.  5. Optimal Fluoroscopic Angle for Delivery: LAO 15 CRA 5  6. Large hiatal hernia.  [place] Bells T. Audie Box, MD  Electronically Signed  By: Eleonore Chiquito M.D.  On: 03/02/2021 17:46   Addended by Geralynn Rile, MD on 03/02/2021 5:48 PM  Study Result  Narrative & Impression  EXAM:  OVER-READ INTERPRETATION CT CHEST  The following report is an over-read performed by radiologist Dr.  Yetta Glassman of [facility], [place] on 03/02/2021. This  over-read does not include interpretation of cardiac or coronary  anatomy or pathology. The coronary calcium score/coronary CTA  interpretation by the cardiologist is attached.  COMPARISON: None.  FINDINGS:  Extracardiac findings will be described separately under dictation  for contemporaneously obtained CTA chest, abdomen and pelvis.  IMPRESSION:  Please see separate dictation for contemporaneously obtained CTA  chest, abdomen and pelvis dated 03/02/2021 for full description of  relevant extracardiac findings.  Electronically Signed:  By: Yetta Glassman M.D.  On: 03/02/2021 13:27    Narrative & Impression  CLINICAL DATA: Preop evaluation for transcatheter aortic valve  replacement  EXAM:  CTA ABDOMEN AND PELVIS WITHOUT AND WITH CONTRAST  TECHNIQUE:  Multidetector CT imaging of the abdomen and pelvis was performed  using the standard protocol during bolus administration of  intravenous contrast. Multiplanar reconstructed images and MIPs were  obtained and reviewed to evaluate the vascular anatomy.   RADIATION DOSE REDUCTION: This exam was performed according to the  departmental dose-optimization program which includes automated  exposure control, adjustment of the mA and/or kV according to  patient size and/or use of iterative reconstruction technique.  CONTRAST: 44mL OMNIPAQUE IOHEXOL 350 MG/ML  SOLN  COMPARISON: None.  FINDINGS:  CTA CHEST FINDINGS  Cardiovascular: Normal heart size. No significant pericardial  effusion/thickening. Mild coronary artery calcifications of the LAD.  Great vessels are normal in course and caliber. No central pulmonary  emboli.  Mediastinum/Nodes: No discrete thyroid nodules. Large hiatal hernia.  No pathologically enlarged axillary, mediastinal or hilar lymph  nodes.  Lungs/Pleura: No pneumothorax. No pleural effusion. Small solid  pulmonary nodule the left upper lobe measuring 4 mm on series 5,  image 55. Mild bibasilar atelectasis adjacent to large hiatal  hernia.  Musculoskeletal: No aggressive appearing focal osseous lesions. Mild  compression deformity of T11.  CTA ABDOMEN AND PELVIS FINDINGS  Hepatobiliary: Normal liver with no liver mass. Surgically absent  gallbladder dilated common bile duct, measuring up to 1.1 cm. No  biliary ductal dilatation.  Pancreas: No ductal dilation. Cystic lesion of the head/uncinate  process measuring up to 1.3 cm on series 4 image 113.  Spleen: Normal size. No mass. Peripheral wedge-shaped areas of  hypoattenuation.  Adrenals/Urinary Tract: Normal adrenals. Kidneys enhance  symmetrically with no evidence of hydronephrosis or nephrolithiasis.  Normal bladder.  Stomach/Bowel: Normal non-distended stomach. Normal caliber small  bowel with no small bowel wall thickening. Mild sigmoid  diverticulosis. No large bowel wall thickening or pericolonic fat  stranding.  Vascular/Lymphatic: Moderate atherosclerotic disease the abdominal  aorta consisting of calcified and noncalcified plaque. Major aortic  branch  vessels are patent. 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 No pathologically  enlarged lymph nodes in the abdomen or pelvis.  Reproductive: Uterus is present. No adnexal masses.  Other: No pneumoperitoneum, ascites or focal fluid collection.  Musculoskeletal: No aggressive appearing focal osseous lesions.  VASCULAR MEASUREMENTS PERTINENT TO TAVR:  AORTA:  Minimal Aortic Diameter-13.7 mm  Severity of Aortic Calcification-mild  RIGHT PELVIS:  Right Common Iliac Artery -  Minimal Diameter-8.8 mm  Tortuosity-none  Calcification-mild  Right External Iliac Artery -  Minimal Diameter-6.6 mm  Tortuosity-mild  Calcification-mild  Right Common Femoral Artery -  Minimal Diameter-6.9 mm  Tortuosity-none  Calcification-mild  LEFT PELVIS:  Left Common Iliac Artery -  Minimal Diameter-9.2 mm  Tortuosity-none  Calcification-mild  Left External Iliac Artery -  Minimal Diameter-8.9 mm  Tortuosity-mild  Calcification-mild  Left Common Femoral Artery -  Minimal Diameter-6.8 mm  Tortuosity-none  Calcification-mild  Review of the MIP images confirms the above findings.  IMPRESSION:  Vascular:  1. Vascular findings and measurements pertinent to potential TAVR  procedure, as detailed above.  2. Severe thickening calcification of the aortic valve, compatible  with reported clinical history of severe aortic stenosis.  3. Moderate aortoiliac atherosclerosis. LAD coronary artery disease.  Nonvascular:  1. 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  2. Small solid pulmonary nodule the left upper lobe measuring 4 mm.  No follow-up needed if patient is low-risk. Non-contrast chest CT  can be considered in 12 months if patient is high-risk. This  recommendation follows the consensus statement: Guidelines for  Management of Incidental Pulmonary Nodules Detected on CT Images:  From the Fleischner  Society 2017; Radiology 2017; 284:228-243.  3. Dilated common bile duct which measures up to 1.1 cm, slightly  greater than expected status post cholecystectomy. Recommend  correlation with liver function tests. If LFTs are abnormal,  consider MRCP.  4. Cystic lesion of the pancreatic head/uncinate process measuring  up to 1.3 cm. Recommend follow up pre and post contrast MRI/MRCP  or  pancreatic protocol CT in 2 years. This recommendation follows ACR  consensus guidelines: Management of Incidental Pancreatic Cysts: A  White Paper of the ACR Incidental Findings Committee. [place] [id number].  Electronically Signed  By: Yetta Glassman M.D.  On: 03/02/2021 14:14    STS RISK CALCULATOR:   Risk of Mortality:  2.181%  Renal Failure:  2.608%  Permanent Stroke:  2.057%  Prolonged Ventilation:  5.481%  DSW Infection:  0.092%  Reoperation:  3.071%  Morbidity or Mortality:  9.339%  Short Length of Stay:  33.726%  Long Length of Stay:  5.007%   Impression:   This 76 year old woman has stage D, severe, symptomatic low-flow/low gradient aortic stenosis with [place] Heart Association class II-lll symptoms of exertional fatigue and shortness of breath consistent with chronic diastolic congestive heart failure. She has also had recurrent episodes of dizziness with a couple falls over the last few months. I have personally reviewed her 2D echocardiogram, cardiac catheterization, and CTA studies. Her echo in October 2022 showed a severely calcified aortic valve with thickened leaflets and poor mobility. The mean gradient was measured at 25 mmHg with an aortic valve area of 0.71 cm and dimensionless index of 0.25. Her valve looks severely stenotic. Stroke-volume index was low at 28. Left ventricular systolic function was normal. Cardiac catheterization showed no coronary disease. I agree that aortic valve replacement is indicated in this patient for relief of her symptoms and to  prevent progressive left ventricular deterioration. Her gated cardiac CTA shows anatomy suitable for TAVR using a SAPIEN 3 or Medtronic valve. Her gated cardiac CTA shows anatomy suitable for transfemoral insertion.  The patient and her daughter were counseled at length regarding treatment alternatives for management of severe symptomatic aortic stenosis. The risks and benefits of surgical intervention has been discussed in detail. Long-term prognosis with medical therapy was discussed. Alternative approaches such as conventional surgical aortic valve replacement, transcatheter aortic valve replacement, and palliative medical therapy were compared and contrasted at length. This discussion was placed in the context of the patient's own specific clinical presentation and past medical history. All of their questions have been addressed.  Following the decision to proceed with transcatheter aortic valve replacement, a discussion was held regarding what types of management strategies would be attempted intraoperatively in the event of life-threatening complications, including whether or not the patient would be considered a candidate for the use of cardiopulmonary bypass and/or conversion to open sternotomy for attempted surgical intervention. I think she would be a candidate for emergent sternotomy to manage any intraoperative complications. The patient is aware of the fact that transient use of cardiopulmonary bypass may be necessary. The patient has been advised of a variety of complications that might develop including but not limited to risks of death, stroke, paravalvular leak, aortic dissection or other major vascular complications, aortic annulus rupture, device embolization, cardiac rupture or perforation, mitral regurgitation, acute myocardial infarction, arrhythmia, heart block or bradycardia requiring permanent pacemaker placement, congestive heart failure, respiratory failure, renal failure, pneumonia,  infection, other late complications related to structural valve deterioration or migration, or other complications that might ultimately cause a temporary or permanent loss of functional independence or other long term morbidity. The patient provides full informed consent for the procedure as described and all questions were answered.   Plan:   Transfemoral TAVR   Gaye Pollack, MD

## 2021-03-16 ENCOUNTER — Inpatient Hospital Stay (HOSPITAL_COMMUNITY)
Admission: RE | Admit: 2021-03-16 | Discharge: 2021-03-17 | DRG: 266 | Disposition: A | Discharge status: Home / Self Care | Payer: Medicare PPO | Attending: Internal Medicine | Admitting: Internal Medicine

## 2021-03-16 ENCOUNTER — Inpatient Hospital Stay (HOSPITAL_COMMUNITY): Payer: Medicare PPO

## 2021-03-16 ENCOUNTER — Encounter (HOSPITAL_COMMUNITY): Payer: Self-pay | Admitting: Internal Medicine

## 2021-03-16 ENCOUNTER — Inpatient Hospital Stay (HOSPITAL_COMMUNITY): Payer: Medicare PPO | Admitting: Anesthesiology

## 2021-03-16 ENCOUNTER — Inpatient Hospital Stay (HOSPITAL_COMMUNITY): Payer: Medicare PPO | Admitting: Vascular Surgery

## 2021-03-16 ENCOUNTER — Other Ambulatory Visit: Payer: Self-pay

## 2021-03-16 ENCOUNTER — Other Ambulatory Visit: Payer: Self-pay | Admitting: Physician Assistant

## 2021-03-16 ENCOUNTER — Encounter (HOSPITAL_COMMUNITY)
Admission: RE | Disposition: A | Discharge status: Home / Self Care | Payer: Self-pay | Source: Home / Self Care | Attending: Internal Medicine

## 2021-03-16 DIAGNOSIS — I352 Nonrheumatic aortic (valve) stenosis with insufficiency: Secondary | ICD-10-CM | POA: Diagnosis not present

## 2021-03-16 DIAGNOSIS — Z888 Allergy status to other drugs, medicaments and biological substances status: Secondary | ICD-10-CM | POA: Diagnosis not present

## 2021-03-16 DIAGNOSIS — I350 Nonrheumatic aortic (valve) stenosis: Secondary | ICD-10-CM | POA: Diagnosis not present

## 2021-03-16 DIAGNOSIS — I330 Acute and subacute infective endocarditis: Secondary | ICD-10-CM | POA: Diagnosis present

## 2021-03-16 DIAGNOSIS — Z8249 Family history of ischemic heart disease and other diseases of the circulatory system: Secondary | ICD-10-CM

## 2021-03-16 DIAGNOSIS — Z79899 Other long term (current) drug therapy: Secondary | ICD-10-CM

## 2021-03-16 DIAGNOSIS — K219 Gastro-esophageal reflux disease without esophagitis: Secondary | ICD-10-CM | POA: Diagnosis not present

## 2021-03-16 DIAGNOSIS — M1990 Unspecified osteoarthritis, unspecified site: Secondary | ICD-10-CM | POA: Diagnosis present

## 2021-03-16 DIAGNOSIS — Z7983 Long term (current) use of bisphosphonates: Secondary | ICD-10-CM | POA: Diagnosis not present

## 2021-03-16 DIAGNOSIS — Z952 Presence of prosthetic heart valve: Secondary | ICD-10-CM | POA: Diagnosis not present

## 2021-03-16 DIAGNOSIS — E119 Type 2 diabetes mellitus without complications: Secondary | ICD-10-CM

## 2021-03-16 DIAGNOSIS — N1832 Type 2 diabetes mellitus with diabetic chronic kidney disease: Secondary | ICD-10-CM

## 2021-03-16 DIAGNOSIS — E1122 Type 2 diabetes mellitus with diabetic chronic kidney disease: Secondary | ICD-10-CM | POA: Diagnosis not present

## 2021-03-16 DIAGNOSIS — Z96653 Presence of artificial knee joint, bilateral: Secondary | ICD-10-CM | POA: Diagnosis present

## 2021-03-16 DIAGNOSIS — Z882 Allergy status to sulfonamides status: Secondary | ICD-10-CM

## 2021-03-16 DIAGNOSIS — Z006 Encounter for examination for normal comparison and control in clinical research program: Secondary | ICD-10-CM

## 2021-03-16 DIAGNOSIS — N183 Chronic kidney disease, stage 3 unspecified: Secondary | ICD-10-CM

## 2021-03-16 DIAGNOSIS — I2510 Atherosclerotic heart disease of native coronary artery without angina pectoris: Secondary | ICD-10-CM | POA: Diagnosis present

## 2021-03-16 DIAGNOSIS — Z7984 Long term (current) use of oral hypoglycemic drugs: Secondary | ICD-10-CM

## 2021-03-16 DIAGNOSIS — I471 Supraventricular tachycardia: Secondary | ICD-10-CM | POA: Diagnosis present

## 2021-03-16 DIAGNOSIS — D649 Anemia, unspecified: Secondary | ICD-10-CM | POA: Diagnosis present

## 2021-03-16 DIAGNOSIS — D638 Anemia in other chronic diseases classified elsewhere: Secondary | ICD-10-CM

## 2021-03-16 HISTORY — PX: INTRAOPERATIVE TRANSTHORACIC ECHOCARDIOGRAM: SHX6523

## 2021-03-16 HISTORY — DX: Nonrheumatic aortic (valve) stenosis: I35.0

## 2021-03-16 HISTORY — DX: Presence of prosthetic heart valve: Z95.2

## 2021-03-16 HISTORY — PX: TRANSCATHETER AORTIC VALVE REPLACEMENT, TRANSFEMORAL: SHX6400

## 2021-03-16 LAB — POCT I-STAT, CHEM 8
BUN: 18 mg/dL (ref 8–23)
BUN: 18 mg/dL (ref 8–23)
Calcium, Ion: 1.3 mmol/L (ref 1.15–1.40)
Calcium, Ion: 1.28 mmol/L (ref 1.15–1.40)
Chloride: 104 mmol/L (ref 98–111)
Chloride: 106 mmol/L (ref 98–111)
Creatinine, Ser: 1.1 mg/dL — ABNORMAL HIGH (ref 0.44–1.00)
Creatinine, Ser: 1.1 mg/dL — ABNORMAL HIGH (ref 0.44–1.00)
Glucose, Bld: 139 mg/dL — ABNORMAL HIGH (ref 70–99)
Glucose, Bld: 116 mg/dL — ABNORMAL HIGH (ref 70–99)
HCT: 32 % — ABNORMAL LOW (ref 36.0–46.0)
HCT: 33 % — ABNORMAL LOW (ref 36.0–46.0)
Hemoglobin: 10.9 g/dL — ABNORMAL LOW (ref 12.0–15.0)
Hemoglobin: 11.2 g/dL — ABNORMAL LOW (ref 12.0–15.0)
Potassium: 4.6 mmol/L (ref 3.5–5.1)
Potassium: 4.4 mmol/L (ref 3.5–5.1)
Sodium: 138 mmol/L (ref 135–145)
Sodium: 141 mmol/L (ref 135–145)
TCO2: 26 mmol/L (ref 22–32)
TCO2: 26 mmol/L (ref 22–32)

## 2021-03-16 LAB — GLUCOSE, CAPILLARY
Glucose-Capillary: 75 mg/dL (ref 70–99)
Glucose-Capillary: 128 mg/dL — ABNORMAL HIGH (ref 70–99)
Glucose-Capillary: 101 mg/dL — ABNORMAL HIGH (ref 70–99)

## 2021-03-16 LAB — ECHOCARDIOGRAM LIMITED
AR max vel: 3.58 cm2
AV Area VTI: 2.98 cm2
AV Area mean vel: 3.4 cm2
AV Mean grad: 2 mmHg
AV Peak grad: 4.6 mmHg
Ao pk vel: 1.07 m/s

## 2021-03-16 SURGERY — IMPLANTATION, AORTIC VALVE, TRANSCATHETER, FEMORAL APPROACH
Anesthesia: Monitor Anesthesia Care | Site: Groin | Laterality: Right

## 2021-03-16 MED ORDER — PROPOFOL 500 MG/50ML IV EMUL
INTRAVENOUS | Status: DC | PRN
  Administered 2021-03-16: 20 ug/kg/min via INTRAVENOUS

## 2021-03-16 MED ORDER — ORAL CARE MOUTH RINSE: 15.0000 mL | Freq: Once | OROMUCOSAL | Status: AC

## 2021-03-16 MED ORDER — PANTOPRAZOLE SODIUM 40 MG PO TBEC: 40.0000 mg | DELAYED_RELEASE_TABLET | Freq: Every day | ORAL | Status: DC

## 2021-03-16 MED ORDER — CHLORHEXIDINE GLUCONATE 0.12 % MT SOLN
15.0000 mL | Freq: Once | OROMUCOSAL | Status: AC
  Administered 2021-03-16: 15 mL via OROMUCOSAL
  Filled 2021-03-16: qty 15

## 2021-03-16 MED ORDER — ONDANSETRON HCL 4 MG/2ML IJ SOLN: 4.0000 mg | Freq: Four times a day (QID) | INTRAMUSCULAR | Status: DC | PRN

## 2021-03-16 MED ORDER — ONDANSETRON HCL 4 MG/2ML IJ SOLN
INTRAMUSCULAR | Status: DC | PRN
Start: 2021-03-16 — End: 2021-03-16
  Administered 2021-03-16: 4 mg via INTRAVENOUS

## 2021-03-16 MED ORDER — CHLORHEXIDINE GLUCONATE 4 % EX LIQD: 60.0000 mL | Freq: Once | CUTANEOUS | Status: DC

## 2021-03-16 MED ORDER — HEPARIN SODIUM (PORCINE) 1000 UNIT/ML IJ SOLN
INTRAMUSCULAR | Status: AC
  Filled 2021-03-16: qty 20

## 2021-03-16 MED ORDER — GABAPENTIN 300 MG PO CAPS
300.0000 mg | ORAL_CAPSULE | Freq: Two times a day (BID) | ORAL | Status: DC
  Administered 2021-03-16 – 2021-03-17 (×3): 300 mg via ORAL
  Filled 2021-03-16 (×3): qty 1

## 2021-03-16 MED ORDER — BUPROPION HCL ER (XL) 300 MG PO TB24: 300.0000 mg | ORAL_TABLET | Freq: Every day | ORAL | Status: DC

## 2021-03-16 MED ORDER — SODIUM CHLORIDE 0.9 % IV SOLN: INTRAVENOUS | Status: AC

## 2021-03-16 MED ORDER — CHLORHEXIDINE GLUCONATE 0.12 % MT SOLN: 15.0000 mL | Freq: Once | OROMUCOSAL | Status: DC

## 2021-03-16 MED ORDER — PROTAMINE SULFATE 10 MG/ML IV SOLN
INTRAVENOUS | Status: AC
  Filled 2021-03-16: qty 25

## 2021-03-16 MED ORDER — ATORVASTATIN CALCIUM 10 MG PO TABS
10.0000 mg | ORAL_TABLET | Freq: Every day | ORAL | Status: DC
  Administered 2021-03-16: 10 mg via ORAL
  Filled 2021-03-16: qty 1

## 2021-03-16 MED ORDER — NITROGLYCERIN IN D5W 200-5 MCG/ML-% IV SOLN: 0.0000 ug/min | INTRAVENOUS | Status: DC

## 2021-03-16 MED ORDER — PROPOFOL 10 MG/ML IV BOLUS
INTRAVENOUS | Status: DC | PRN
  Administered 2021-03-16: 10 mg via INTRAVENOUS

## 2021-03-16 MED ORDER — AMISULPRIDE (ANTIEMETIC) 5 MG/2ML IV SOLN: 10.0000 mg | Freq: Once | INTRAVENOUS | Status: DC | PRN

## 2021-03-16 MED ORDER — 0.9 % SODIUM CHLORIDE (POUR BTL) OPTIME
TOPICAL | Status: DC | PRN
  Administered 2021-03-16: 1000 mL

## 2021-03-16 MED ORDER — ACETAMINOPHEN 10 MG/ML IV SOLN: 1000.0000 mg | Freq: Once | INTRAVENOUS | Status: DC | PRN

## 2021-03-16 MED ORDER — LACTATED RINGERS IV SOLN: INTRAVENOUS | Status: DC | PRN

## 2021-03-16 MED ORDER — HEPARIN 6000 UNIT IRRIGATION SOLUTION
Status: AC
  Filled 2021-03-16: qty 1500

## 2021-03-16 MED ORDER — DULOXETINE HCL 60 MG PO CPEP: 90.0000 mg | ORAL_CAPSULE | Freq: Every day | ORAL | Status: DC

## 2021-03-16 MED ORDER — ACETAMINOPHEN 325 MG PO TABS: 650.0000 mg | ORAL_TABLET | Freq: Four times a day (QID) | ORAL | Status: DC | PRN

## 2021-03-16 MED ORDER — SODIUM CHLORIDE 0.9 % IV SOLN: INTRAVENOUS | Status: DC

## 2021-03-16 MED ORDER — HEPARIN SODIUM (PORCINE) 1000 UNIT/ML IJ SOLN
INTRAMUSCULAR | Status: DC | PRN
  Administered 2021-03-16: 13000 [IU] via INTRAVENOUS

## 2021-03-16 MED ORDER — CYCLOBENZAPRINE HCL 10 MG PO TABS
10.0000 mg | ORAL_TABLET | Freq: Every day | ORAL | Status: DC
  Administered 2021-03-16: 10 mg via ORAL
  Filled 2021-03-16: qty 1

## 2021-03-16 MED ORDER — ONDANSETRON HCL 4 MG/2ML IJ SOLN: 4.0000 mg | Freq: Once | INTRAMUSCULAR | Status: DC | PRN

## 2021-03-16 MED ORDER — TRAMADOL HCL 50 MG PO TABS
50.0000 mg | ORAL_TABLET | ORAL | Status: DC | PRN
  Administered 2021-03-16: 50 mg via ORAL
  Filled 2021-03-16: qty 1

## 2021-03-16 MED ORDER — SODIUM CHLORIDE (PF) 0.9 % IJ SOLN
INTRAMUSCULAR | Status: DC | PRN
  Administered 2021-03-16: 2000 mL

## 2021-03-16 MED ORDER — DULOXETINE HCL 60 MG PO CPEP
90.0000 mg | ORAL_CAPSULE | Freq: Every day | ORAL | Status: DC
  Administered 2021-03-16 – 2021-03-17 (×2): 90 mg via ORAL
  Filled 2021-03-16 (×2): qty 1

## 2021-03-16 MED ORDER — SODIUM CHLORIDE 0.9% FLUSH: 3.0000 mL | INTRAVENOUS | Status: DC | PRN

## 2021-03-16 MED ORDER — LIDOCAINE HCL (PF) 1 % IJ SOLN
INTRAMUSCULAR | Status: AC
  Filled 2021-03-16: qty 30

## 2021-03-16 MED ORDER — SODIUM CHLORIDE 0.9% FLUSH
3.0000 mL | Freq: Two times a day (BID) | INTRAVENOUS | Status: DC
  Administered 2021-03-16: 3 mL via INTRAVENOUS

## 2021-03-16 MED ORDER — CEFAZOLIN SODIUM-DEXTROSE 2-4 GM/100ML-% IV SOLN
2.0000 g | Freq: Three times a day (TID) | INTRAVENOUS | Status: AC
  Administered 2021-03-16 (×2): 2 g via INTRAVENOUS
  Filled 2021-03-16 (×2): qty 100

## 2021-03-16 MED ORDER — LIDOCAINE HCL 1 % IJ SOLN
INTRAMUSCULAR | Status: DC | PRN
  Administered 2021-03-16: 15 mL

## 2021-03-16 MED ORDER — HEPARIN 6000 UNIT IRRIGATION SOLUTION
Status: DC | PRN
Start: 2021-03-16 — End: 2021-03-16
  Administered 2021-03-16: 2

## 2021-03-16 MED ORDER — LURASIDONE HCL 20 MG PO TABS
60.0000 mg | ORAL_TABLET | Freq: Every day | ORAL | Status: DC
  Administered 2021-03-16: 60 mg via ORAL
  Filled 2021-03-16: qty 1
  Filled 2021-03-16 (×2): qty 3

## 2021-03-16 MED ORDER — PROTAMINE SULFATE 10 MG/ML IV SOLN
INTRAVENOUS | Status: DC | PRN
  Administered 2021-03-16: 130 mg via INTRAVENOUS

## 2021-03-16 MED ORDER — ASPIRIN 81 MG PO CHEW
81.0000 mg | CHEWABLE_TABLET | Freq: Every day | ORAL | Status: DC
  Administered 2021-03-17: 81 mg via ORAL
  Filled 2021-03-16: qty 1

## 2021-03-16 MED ORDER — SODIUM CHLORIDE 0.9 % IV SOLN: 250.0000 mL | INTRAVENOUS | Status: DC

## 2021-03-16 MED ORDER — BUPROPION HCL ER (XL) 300 MG PO TB24
300.0000 mg | ORAL_TABLET | Freq: Every day | ORAL | Status: DC
  Administered 2021-03-16 – 2021-03-17 (×2): 300 mg via ORAL
  Filled 2021-03-16 (×2): qty 1

## 2021-03-16 MED ORDER — CHLORHEXIDINE GLUCONATE 4 % EX LIQD: 30.0000 mL | CUTANEOUS | Status: DC

## 2021-03-16 MED ORDER — ACETAMINOPHEN 650 MG RE SUPP: 650.0000 mg | Freq: Four times a day (QID) | RECTAL | Status: DC | PRN

## 2021-03-16 MED ORDER — MORPHINE SULFATE (PF) 2 MG/ML IV SOLN: 1.0000 mg | INTRAVENOUS | Status: DC | PRN

## 2021-03-16 MED ORDER — IODIXANOL 320 MG/ML IV SOLN
INTRAVENOUS | Status: DC | PRN
Start: 2021-03-16 — End: 2021-03-16
  Administered 2021-03-16: 109 mL via INTRA_ARTERIAL

## 2021-03-16 MED ORDER — LACTATED RINGERS IV SOLN: INTRAVENOUS | Status: DC

## 2021-03-16 MED ORDER — FENTANYL CITRATE (PF) 100 MCG/2ML IJ SOLN: 25.0000 ug | INTRAMUSCULAR | Status: DC | PRN

## 2021-03-16 MED ORDER — OXYCODONE HCL 5 MG PO TABS: 5.0000 mg | ORAL_TABLET | ORAL | Status: DC | PRN

## 2021-03-16 MED ORDER — PHENYLEPHRINE 40 MCG/ML (10ML) SYRINGE FOR IV PUSH (FOR BLOOD PRESSURE SUPPORT)
PREFILLED_SYRINGE | INTRAVENOUS | Status: DC | PRN
  Administered 2021-03-16: 40 ug via INTRAVENOUS
  Administered 2021-03-16: 120 ug via INTRAVENOUS

## 2021-03-16 MED ORDER — SODIUM CHLORIDE 0.9 % IV SOLN: 250.0000 mL | INTRAVENOUS | Status: DC | PRN

## 2021-03-16 MED ORDER — MECLIZINE HCL 25 MG PO TABS
25.0000 mg | ORAL_TABLET | Freq: Two times a day (BID) | ORAL | Status: DC | PRN
  Filled 2021-03-16: qty 1

## 2021-03-16 MED ORDER — FAMOTIDINE 20 MG PO TABS
40.0000 mg | ORAL_TABLET | Freq: Every day | ORAL | Status: DC
  Administered 2021-03-16: 40 mg via ORAL
  Filled 2021-03-16: qty 2

## 2021-03-16 MED ORDER — FENTANYL CITRATE (PF) 250 MCG/5ML IJ SOLN
INTRAMUSCULAR | Status: AC
  Filled 2021-03-16: qty 5

## 2021-03-16 MED ORDER — INSULIN ASPART 100 UNIT/ML IJ SOLN: 0.0000 [IU] | INTRAMUSCULAR | Status: DC

## 2021-03-16 SURGICAL SUPPLY — 69 items
ADH SKN CLS APL DERMABOND .7 (GAUZE/BANDAGES/DRESSINGS) ×2
APL PRP STRL LF DISP 70% ISPRP (MISCELLANEOUS) ×2
BAG COUNTER SPONGE SURGICOUNT (BAG) ×3 IMPLANT
BAG DECANTER FOR FLEXI CONT (MISCELLANEOUS) IMPLANT
BAG SPNG CNTER NS LX DISP (BAG) ×2
BALLN TRUE 25X4.5 (BALLOONS) ×3
BALLOON TRUE 25X4.5 (BALLOONS) IMPLANT
BLADE CLIPPER SURG (BLADE) IMPLANT
BLADE STERNUM SYSTEM 6 (BLADE) IMPLANT
CABLE ADAPT CONN TEMP 6FT (ADAPTER) ×3 IMPLANT
CANISTER SUCT 3000ML PPV (MISCELLANEOUS) IMPLANT
CATH DIAG EXPO 6F AL1 (CATHETERS) IMPLANT
CATH DIAG EXPO 6F VENT PIG 145 (CATHETERS) ×6 IMPLANT
CATH INFINITI 6F AL2 (CATHETERS) IMPLANT
CATH S G BIP PACING (CATHETERS) ×3 IMPLANT
CHLORAPREP W/TINT 26 (MISCELLANEOUS) ×3 IMPLANT
CLOSURE MYNX CONTROL 6F/7F (Vascular Products) ×1 IMPLANT
CNTNR URN SCR LID CUP LEK RST (MISCELLANEOUS) ×4 IMPLANT
CONT SPEC 4OZ STRL OR WHT (MISCELLANEOUS) ×6
COVER BACK TABLE 80X110 HD (DRAPES) IMPLANT
DECANTER SPIKE VIAL GLASS SM (MISCELLANEOUS) ×3 IMPLANT
DERMABOND ADVANCED (GAUZE/BANDAGES/DRESSINGS) ×1
DERMABOND ADVANCED .7 DNX12 (GAUZE/BANDAGES/DRESSINGS) ×2 IMPLANT
DEVICE CLOSURE PERCLS PRGLD 6F (VASCULAR PRODUCTS) ×4 IMPLANT
DRSG TEGADERM 4X4.75 (GAUZE/BANDAGES/DRESSINGS) ×6 IMPLANT
DRYSEAL FLEXSHEATH 14FR 33CM (SHEATH) ×1
ELECT REM PT RETURN 9FT ADLT (ELECTROSURGICAL) ×3
ELECTRODE REM PT RTRN 9FT ADLT (ELECTROSURGICAL) ×2 IMPLANT
GAUZE 4X4 16PLY ~~LOC~~+RFID DBL (SPONGE) ×1 IMPLANT
GAUZE SPONGE 4X4 12PLY STRL (GAUZE/BANDAGES/DRESSINGS) ×3 IMPLANT
GLOVE SURG ENC MOIS LTX SZ7.5 (GLOVE) IMPLANT
GLOVE SURG ENC MOIS LTX SZ8 (GLOVE) IMPLANT
GLOVE SURG ORTHO LTX SZ7.5 (GLOVE) IMPLANT
GOWN STRL REUS W/ TWL LRG LVL3 (GOWN DISPOSABLE) IMPLANT
GOWN STRL REUS W/ TWL XL LVL3 (GOWN DISPOSABLE) ×2 IMPLANT
GOWN STRL REUS W/TWL LRG LVL3 (GOWN DISPOSABLE)
GOWN STRL REUS W/TWL XL LVL3 (GOWN DISPOSABLE) ×3
GUIDEWIRE SAF TJ AMPL .035X180 (WIRE) ×3 IMPLANT
GUIDEWIRE SAFE TJ AMPLATZ EXST (WIRE) ×3 IMPLANT
KIT BASIN OR (CUSTOM PROCEDURE TRAY) ×3 IMPLANT
KIT HEART LEFT (KITS) ×3 IMPLANT
KIT TURNOVER KIT B (KITS) ×3 IMPLANT
NS IRRIG 1000ML POUR BTL (IV SOLUTION) ×3 IMPLANT
PACK ENDO MINOR (CUSTOM PROCEDURE TRAY) ×3 IMPLANT
PAD ARMBOARD 7.5X6 YLW CONV (MISCELLANEOUS) ×6 IMPLANT
PAD ELECT DEFIB RADIOL ZOLL (MISCELLANEOUS) ×3 IMPLANT
PERCLOSE PROGLIDE 6F (VASCULAR PRODUCTS) ×6
POSITIONER HEAD DONUT 9IN (MISCELLANEOUS) ×3 IMPLANT
SET MICROPUNCTURE 5F STIFF (MISCELLANEOUS) ×3 IMPLANT
SHEATH BRITE TIP 7FR 35CM (SHEATH) ×3 IMPLANT
SHEATH DRYSEAL FLEX 14FR 33CM (SHEATH) IMPLANT
SHEATH PINNACLE 6F 10CM (SHEATH) ×3 IMPLANT
SHEATH PINNACLE 8F 10CM (SHEATH) ×3 IMPLANT
SLEEVE REPOSITIONING LENGTH 30 (MISCELLANEOUS) ×3 IMPLANT
SPONGE T-LAP 18X18 ~~LOC~~+RFID (SPONGE) ×1 IMPLANT
STOPCOCK MORSE 400PSI 3WAY (MISCELLANEOUS) ×6 IMPLANT
SUT PROLENE 6 0 C 1 30 (SUTURE) IMPLANT
SUT SILK  1 MH (SUTURE) ×3
SUT SILK 1 MH (SUTURE) ×2 IMPLANT
SYR 50ML LL SCALE MARK (SYRINGE) ×3 IMPLANT
SYR BULB IRRIG 60ML STRL (SYRINGE) IMPLANT
TOWEL GREEN STERILE (TOWEL DISPOSABLE) ×6 IMPLANT
TRANSDUCER W/STOPCOCK (MISCELLANEOUS) ×6 IMPLANT
TRAY FOLEY SLVR 14FR TEMP STAT (SET/KITS/TRAYS/PACK) IMPLANT
TUBE SUCT INTRACARD DLP 20F (MISCELLANEOUS) IMPLANT
VALVE EVOLUT FX 29 (Valve) ×1 IMPLANT
WIRE EMERALD 3MM-J .035X150CM (WIRE) ×3 IMPLANT
WIRE EMERALD 3MM-J .035X260CM (WIRE) ×3 IMPLANT
WIRE MICRO SET SILHO 5FR 7 (SHEATH) ×1 IMPLANT

## 2021-03-16 NOTE — Progress Notes (Signed)
°  [facility] VALVE TEAM  Patient doing well s/p TAVR. She is hemodynamically stable. Groin sites stable. ECG with NSR/SB and new LBBB however no high grade block. Plan to DC arterial line and transfer to 4E. Anticipate early ambulation after bedrest completed and hopeful discharge over the next 24-48 hours.   Kathyrn Drown NP-C Structural Heart Team  Pager: [phone] Phone: [phone]

## 2021-03-16 NOTE — Progress Notes (Signed)
Pt admitted to rm 4 from cath lab. CHG wipe given, initiated tele, oriented pt to the unit. VSS. Call bell within reach.   Lawson Radar, RN

## 2021-03-16 NOTE — Anesthesia Preprocedure Evaluation (Signed)
Anesthesia Evaluation  Patient identified by MRN, date of birth, ID band Patient awake    Reviewed: Allergy & Precautions, NPO status , Patient's Chart, lab work & pertinent test results  Airway Mallampati: III  TM Distance: >3 FB Neck ROM: Full    Dental  (+) Missing   Pulmonary neg pulmonary ROS,    Pulmonary exam normal breath sounds clear to auscultation       Cardiovascular + dysrhythmias + Valvular Problems/Murmurs AS  Rhythm:Regular Rate:Normal + Systolic murmurs ECG: SR, rate 69   Neuro/Psych PSYCHIATRIC DISORDERS Depression negative neurological ROS     GI/Hepatic negative GI ROS, Neg liver ROS,   Endo/Other  negative endocrine ROS  Renal/GU negative Renal ROS     Musculoskeletal  (+) Arthritis ,   Abdominal (+) + obese,   Peds  Hematology negative hematology ROS (+)   Anesthesia Other Findings Severe Aortic Stenosis  Reproductive/Obstetrics                             Anesthesia Physical Anesthesia Plan  ASA: 4  Anesthesia Plan: MAC   Post-op Pain Management:    Induction: Intravenous  PONV Risk Score and Plan: 2 and Ondansetron, Dexamethasone and Treatment may vary due to age or medical condition  Airway Management Planned: Simple Face Mask  Additional Equipment:   Intra-op Plan:   Post-operative Plan:   Informed Consent: I have reviewed the patients History and Physical, chart, labs and discussed the procedure including the risks, benefits and alternatives for the proposed anesthesia with the patient or authorized representative who has indicated his/her understanding and acceptance.     Dental advisory given  Plan Discussed with: CRNA  Anesthesia Plan Comments:         Anesthesia Quick Evaluation

## 2021-03-16 NOTE — Transfer of Care (Signed)
Immediate Anesthesia Transfer of Care Note  Patient: Nicole Graves  Procedure(s) Performed: TRANSCATHETER AORTIC VALVE REPLACEMENT, TRANSFEMORAL (Right: Groin) INTRAOPERATIVE TRANSTHORACIC ECHOCARDIOGRAM  Patient Location: Cath Lab  Anesthesia Type:MAC  Level of Consciousness: awake  Airway & Oxygen Therapy: Patient Spontanous Breathing  Post-op Assessment: Report given to RN and Post -op Vital signs reviewed and stable  Post vital signs: Reviewed and stable  Last Vitals:  Vitals Value Taken Time  BP 100/69   Temp    Pulse 66   Resp 13   SpO2 96     Last Pain:  Vitals:   03/16/21 0633  TempSrc:   PainSc: 0-No pain      Patients Stated Pain Goal: 0 (57/50/51 8335)  Complications: No notable events documented.

## 2021-03-16 NOTE — Progress Notes (Signed)
Mobility Specialist: Progress Note   03/16/21 1636  Mobility  Bed Position Chair  Activity Ambulated independently in hallway  Level of Assistance Independent  Assistive Device None  Distance Ambulated (ft) 240 ft  Activity Response Tolerated well  $Mobility charge 1 Mobility   Pre-Mobility: 89 HR Post-Mobility: 100 HR  Received pt in chair having no complaints and agreeable to mobility. Asymptomatic throughout ambulation, returned back to chair w/ call bell in reach and all needs met.  [facility] Zebedee Segundo Mobility Specialist Mobility Specialist 4 [place]: [phone] Mobility Specialist 2 [place] and [place]: [phone]

## 2021-03-16 NOTE — Op Note (Signed)
[facility]   MULTIDISCIPLINARY HEART VALVE TEAM   TAVR OPERATIVE NOTE   Date of Procedure:  03/16/2021   Preoperative Diagnosis: Severe Aortic Stenosis   Postoperative Diagnosis: Same   Procedure:   Transcatheter Aortic Valve Replacement - Percutaneous Transfemoral Approach  Medtronic Evolut Pro-Plus (size 29 mm, serial # [id number])   Co-Surgeons:  Gaye Pollack, MD and Lenna Sciara MD  Anesthesiologist:  Adele Barthel, MD  Echocardiographer:  Jenkins Rouge, MD  Pre-operative Echo Findings:  Severe aortic stenosis  Left ventricular systolic function  Post-operative Echo Findings: Trace paravalvular leak Normal left ventricular systolic function  BRIEF CLINICAL NOTE AND INDICATIONS FOR SURGERY  The patient is a 76 year old female with a history of paradoxical low-flow low gradient aortic stenosis and paroxysmal atrial tachycardia was evaluated in the multidisciplinary fashion due to lifestyle limiting dyspnea.  During the course of the patient's preoperative work up they have been evaluated comprehensively by a multidisciplinary team of specialists coordinated through the [facility] in the [place] [facility].  They have been demonstrated to suffer from symptomatic severe aortic stenosis as noted above. The patient has been counseled extensively as to the relative risks and benefits of all options for the treatment of severe aortic stenosis including long term medical therapy, conventional surgery for aortic valve replacement, and transcatheter aortic valve replacement.  The patient has been independently evaluated in formal cardiac surgical consultation by Dr Cyndia Bent, who deemed the patient appropriate for TAVR. Based upon review of all of the patient's preoperative diagnostic tests they are felt to be candidate for transcatheter aortic valve replacement using the transfemoral approach as an alternative to conventional  surgery.    Following the decision to proceed with transcatheter aortic valve replacement, a discussion has been held regarding what types of management strategies would be attempted intraoperatively in the event of life-threatening complications, including whether or not the patient would be considered a candidate for the use of cardiopulmonary bypass and/or conversion to open sternotomy for attempted surgical intervention.  The patient has been advised of a variety of complications that might develop peculiar to this approach including but not limited to risks of death, stroke, paravalvular leak, aortic dissection or other major vascular complications, aortic annulus rupture, device embolization, cardiac rupture or perforation, acute myocardial infarction, arrhythmia, heart block or bradycardia requiring permanent pacemaker placement, congestive heart failure, respiratory failure, renal failure, pneumonia, infection, other late complications related to structural valve deterioration or migration, or other complications that might ultimately cause a temporary or permanent loss of functional independence or other long term morbidity.  The patient provides full informed consent for the procedure as described and all questions were answered preoperatively.  DETAILS OF THE OPERATIVE PROCEDURE  PREPARATION:   The patient is brought to the operating room on the above mentioned date and central monitoring was established by the anesthesia team including placement of a radial arterial line. The patient is placed in the supine position on the operating table.  Intravenous antibiotics are administered. The patient is monitored closely throughout the procedure under conscious sedation.      Baseline transthoracic echocardiogram is performed. The patient's chest, abdomen, both groins, and both lower extremities are prepared and draped in a sterile manner. A time out procedure is performed.   PERIPHERAL ACCESS:    Using ultrasound guidance, femoral arterial and venous access is obtained with placement of 6 Fr sheaths on the left side.  [place] images are captured and digitally  stored in the patient's record. A pigtail diagnostic catheter was passed through the femoral arterial sheath under fluoroscopic guidance into the aortic root (non-coronary cusp).  A temporary transvenous pacemaker catheter was passed through the femoral venous sheath under fluoroscopic guidance into the right ventricle.  The pacemaker was tested to ensure stable lead placement and pacemaker capture.   TRANSFEMORAL ACCESS:  A micropuncture technique is used to access the right femoral artery under fluoroscopic and ultrasound guidance.  [place] images are captured and digitally stored in the patient's chart. 2 Perclose devices are deployed at 10' and 2' positions to 'PreClose' the femoral artery. An 8 French sheath is placed and then an Amplatz Superstiff wire is advanced through the sheath. An AL-1 catheter was used to direct a straight-tip exchange length wire across the native aortic valve into the left ventricle. This was exchanged out for a pigtail catheter and position was confirmed in the LV apex.  Simultaneous LV and Ao pressures were recorded.  The pigtail catheter was exchanged for a Safari wire in the LV apex.    TRANSCATHETER HEART VALVE DEPLOYMENT:  A Medtronic Evolut Pro-Plus transcatheter heart valve (size 29 mm) was prepared and crimped per manufacturer's guidelines, and the proper orientation of the valve is confirmed on the Medtronic delivery system. The valve was advanced through the introducer sheath and across the aortic arch over the [facility] wire until it is positioned at the base of the pigtail catheter in the aortic valve annulus. Using the fine tuning wheel, valve deployment begins until annular contact is made. Controlled ventricular pacing is performed. Once proper position is confirmed via aortic root angiograms in the cusp  overlap and LAO projections, the valve is deployed to 80% where it is fully functional. The patient's hemodynamic recovery following valve deployment is good.  Final position is confirmed and the valve is slowly deployed over 30 seconds until both paddles are released. The delivery catheter and Confida wire are removed and the valve is assessed with echocardiography. Echo demostrated acceptable post-procedural gradients, stable mitral valve function, and moderate paravalvular aortic insufficiency.   BALLOON AORTIC VALVULOPLASTY:  Based on the presence of moderate paravalvular leak post dilation of the valve with a 25 mm true balloon with rapid pacing was performed.  Following this echocardiographic images demonstrated much improved paravalvular leak, normal contractile function, normal valve function, and no pericardial effusion.  PROCEDURE COMPLETION:  The sheath was removed and femoral artery closure is performed using the 2 previously deployed Perclose devices.  Protamine is administered once femoral arterial repair was complete. The site is clear with no evidence of bleeding or hematoma after the sutures are tightened. The temporary pacemaker and pigtail catheters are removed. Mynx closure  is used for contralateral femoral arterial hemostasis for the 6 Fr sheath.  The patient tolerated the procedure well and is transported to the recovery area in stable condition. There were no immediate intraoperative complications. All sponge instrument and needle counts are verified correct at completion of the operation.   The patient received a total of 109 mL of intravenous contrast during the procedure.   Lenna Sciara, MD @td  @now

## 2021-03-16 NOTE — Anesthesia Postprocedure Evaluation (Signed)
Anesthesia Post Note  Patient: Nicole Graves  Procedure(s) Performed: TRANSCATHETER AORTIC VALVE REPLACEMENT, TRANSFEMORAL (Right: Groin) INTRAOPERATIVE TRANSTHORACIC ECHOCARDIOGRAM     Patient location during evaluation: Cath Lab Anesthesia Type: MAC Level of consciousness: awake Pain management: pain level controlled Vital Signs Assessment: post-procedure vital signs reviewed and stable Respiratory status: spontaneous breathing, nonlabored ventilation, respiratory function stable and patient connected to nasal cannula oxygen Cardiovascular status: stable and blood pressure returned to baseline Postop Assessment: no apparent nausea or vomiting Anesthetic complications: no   No notable events documented.  Last Vitals:  Vitals:   03/16/21 1731 03/16/21 1949  BP: 105/78 136/76  Pulse: 90 89  Resp: 17 14  Temp: 36.9 C 36.7 C  SpO2: 98% 99%    Last Pain:  Vitals:   03/16/21 1949  TempSrc: Oral  PainSc:                  Karyl Kinnier Jibran Crookshanks

## 2021-03-16 NOTE — Progress Notes (Signed)
°  Echocardiogram 2D Echocardiogram has been performed.  Leta Jungling M 03/16/2021, 9:28 AM

## 2021-03-16 NOTE — Anesthesia Procedure Notes (Signed)
Arterial Line Insertion Start/End1/31/2023 6:55 AM, 03/16/2021 7:00 AM Performed by: CRNA  Patient location: Pre-op. Preanesthetic checklist: patient identified, IV checked, site marked, risks and benefits discussed, surgical consent, monitors and equipment checked, pre-op evaluation, timeout performed and anesthesia consent Lidocaine 1% used for infiltration Right, radial was placed Catheter size: 20 G Hand hygiene performed  and maximum sterile barriers used   Attempts: 1 Procedure performed without using ultrasound guided technique. Following insertion, Biopatch and dressing applied. Post procedure assessment: normal  Patient tolerated the procedure well with no immediate complications.

## 2021-03-16 NOTE — Interval H&P Note (Signed)
History and Physical Interval Note:  03/16/2021 6:12 AM  Nicole Graves  has presented today for surgery, with the diagnosis of Severe Aortic Stenosis.  The various methods of treatment have been discussed with the patient and family. After consideration of risks, benefits and other options for treatment, the patient has consented to  Procedure(s): TRANSCATHETER AORTIC VALVE REPLACEMENT, TRANSFEMORAL (Right) INTRAOPERATIVE TRANSTHORACIC ECHOCARDIOGRAM (N/A) as a surgical intervention.  The patient's history has been reviewed, patient examined, no change in status, stable for surgery.  I have reviewed the patient's chart and labs.  Questions were answered to the patient's satisfaction.     Alleen Borne

## 2021-03-16 NOTE — Anesthesia Procedure Notes (Signed)
Procedure Name: MAC Date/Time: 03/16/2021 7:31 AM Performed by: Lorie Phenix, CRNA Pre-anesthesia Checklist: Patient identified, Emergency Drugs available, Suction available and Patient being monitored Oxygen Delivery Method: Simple face mask Placement Confirmation: positive ETCO2

## 2021-03-17 ENCOUNTER — Inpatient Hospital Stay (HOSPITAL_COMMUNITY): Payer: Medicare PPO

## 2021-03-17 ENCOUNTER — Encounter (HOSPITAL_COMMUNITY): Payer: Self-pay | Admitting: Internal Medicine

## 2021-03-17 DIAGNOSIS — Z952 Presence of prosthetic heart valve: Secondary | ICD-10-CM

## 2021-03-17 DIAGNOSIS — N1832 Chronic kidney disease, stage 3b: Secondary | ICD-10-CM

## 2021-03-17 DIAGNOSIS — N183 Chronic kidney disease, stage 3 unspecified: Secondary | ICD-10-CM

## 2021-03-17 LAB — CBC
HCT: 32.2 % — ABNORMAL LOW (ref 36.0–46.0)
Hemoglobin: 10.6 g/dL — ABNORMAL LOW (ref 12.0–15.0)
MCH: 30.3 pg (ref 26.0–34.0)
MCHC: 32.9 g/dL (ref 30.0–36.0)
MCV: 92 fL (ref 80.0–100.0)
Platelets: 219 10*3/uL (ref 150–400)
RBC: 3.5 MIL/uL — ABNORMAL LOW (ref 3.87–5.11)
RDW: 13.3 % (ref 11.5–15.5)
WBC: 7.2 10*3/uL (ref 4.0–10.5)
nRBC: 0 % (ref 0.0–0.2)

## 2021-03-17 LAB — ECHOCARDIOGRAM COMPLETE
AV Mean grad: 9 mmHg
AV Peak grad: 17.5 mmHg
Ao pk vel: 2.09 m/s
Area-P 1/2: 5.66 cm2
Height: 64 in
S' Lateral: 1.7 cm
Weight: 2927.71 oz

## 2021-03-17 LAB — BASIC METABOLIC PANEL
Anion gap: 9 (ref 5–15)
BUN: 15 mg/dL (ref 8–23)
CO2: 23 mmol/L (ref 22–32)
Calcium: 8.5 mg/dL — ABNORMAL LOW (ref 8.9–10.3)
Chloride: 103 mmol/L (ref 98–111)
Creatinine, Ser: 1.38 mg/dL — ABNORMAL HIGH (ref 0.44–1.00)
GFR, Estimated: 39 mL/min — ABNORMAL LOW (ref >60)
Glucose, Bld: 118 mg/dL — ABNORMAL HIGH (ref 70–99)
Potassium: 3.9 mmol/L (ref 3.5–5.1)
Sodium: 135 mmol/L (ref 135–145)

## 2021-03-17 LAB — GLUCOSE, CAPILLARY
Glucose-Capillary: 102 mg/dL — ABNORMAL HIGH (ref 70–99)
Glucose-Capillary: 120 mg/dL — ABNORMAL HIGH (ref 70–99)

## 2021-03-17 NOTE — Progress Notes (Signed)
Discussed restrictions, walking as tolerated and CRPII with pt and daughter. Receptive. Will refer to [place].  [id number] [place], ACSM 12:43 PM 03/17/2021

## 2021-03-17 NOTE — Progress Notes (Signed)
Dc progress note  Patient alert and oriented. Both patient and daughter Willette Cluster verbalized understanding of dc instructions.All belongings given to patient

## 2021-03-17 NOTE — Discharge Summary (Addendum)
[facility]  Discharge Summary    Patient ID: CEDRIKA VAYNSHTEYN MRN: [id number]; DOB: [birth year]  Admit date: 03/16/2021 Discharge date: 03/17/2021  Primary Care Provider: Kathyrn Lass, MD  Primary Cardiologist: Fransico Him, MD/ Dr. Ali Lowe, MD & Dr. Cyndia Bent, MD (TAVR)  Discharge Diagnoses    Principal Problem:   S/P TAVR (transcatheter aortic valve replacement) Active Problems:   SVT (supraventricular tachycardia) (HCC)   Severe aortic stenosis   Anemia   DM2 (diabetes mellitus, type 2) (HCC)   Stage III chronic kidney disease (HCC)  Allergies Allergies  Allergen Reactions   Sulfa Antibiotics Nausea And Vomiting and Other (See Comments)   Tizanidine Hcl Other (See Comments)    Pt does not remember     Diagnostic Studies/Procedures    TAVR OPERATIVE NOTE     Date of Procedure:                03/16/2021    Preoperative Diagnosis:      Severe Aortic Stenosis    Postoperative Diagnosis:    Same    Procedure:        Transcatheter Aortic Valve Replacement - Percutaneous Transfemoral Approach             Medtronic Evolut Pro-Plus (size 29 mm, serial # [id number])              Co-Surgeons:                        Gaye Pollack, MD and Lenna Sciara MD   Anesthesiologist:                  Adele Barthel, MD   Echocardiographer:              Jenkins Rouge, MD   Pre-operative Echo Findings:  Severe aortic stenosis  Left ventricular systolic function   Post-operative Echo Findings: Trace paravalvular leak Normal left ventricular systolic function  ____________   Echo 03/17/21:   1. Left ventricular ejection fraction, by estimation, is 55 to 60%. The  left ventricle has normal function. The left ventricle has no regional  wall motion abnormalities. Left ventricular diastolic parameters are  indeterminate.   2. Right ventricular systolic function is normal. The right ventricular  size is normal. There is mildly elevated  pulmonary artery systolic  pressure.   3. Left atrial size was moderately dilated.   4. The mitral valve is degenerative. Mild mitral valve regurgitation. No  evidence of mitral stenosis. Moderate mitral annular calcification.   5. Well seated bioprosthetic valve. The aortic valve has been  repaired/replaced. Aortic valve regurgitation Mild aortic regurgitation  with trivial paravalvular leak at the 1oclock position. No aortic stenosis  is present. There is a CoreValve-Evolut Pro  prosthetic (TAVR) valve present in the aortic position. Procedure Date:  03/16/2021. Aortic valve mean gradient measures 9.0 mmHg. Aortic valve Vmax  measures 2.09 m/s.   6. The inferior vena cava is normal in size with greater than 50%  respiratory variability, suggesting right atrial pressure of 3 mmHg.   History of Present Illness     ADDIELYNN KLEINBERG is a 76 y.o. female with a history of paroxysmal atrial tachycardia, CKD stage II, anemia, DMT2, arthritis with decreased mobility, and severe LFLG AS who presented to [facility] on 03/16/21 for planned TAVR.   She was initially referred to Dr. Ali Lowe by Dr. Radford [name] for recommendations regarding the patient's progressive  aortic stenosis with symptoms of SOB and dizziness with minimal exertion. Plan was to pursue further TAVR workup with LHC performed 02/17/21 that showed no CAD with right dominance.  She was evaluated by the multidisciplinary valve team, including Dr. Cyndia Bent and felt to have severe, symptomatic aortic stenosis and to be a suitable candidate for TAVR, which was set up for 03/16/21.     Hospital Course    Severe AS: s/p successful TAVR with a 29 mm Medtronic Evolut Pro-Plus via the TF approach on 03/16/21. Post operative echo 03/17/21 with trivial paravalvular leak at the 1oclock position. Groin sites are stable. ECG with NSR and no high grade heart block. Started on ASA 81mg  PO QD. She has ambulated without issues. SBE to be RX'ed at follow up next week. Post  procedure care instructions reviewed and included on AVS.   Paroxysmal atrial tachycardia: HRs stable during hospital course. Will restart PTA Toprol  CKD stage II: Creatinine, 1.38 today with a baseline in the 1.1-1.2 range. Plan to recheck at follow up next week.   DM2: Restart Metformin tomorrow. Follow with PCP  Incidental findings: Peripheral wedge-shaped areas of hypoenhancement seen in the spleen, likely splenic infarcts. Small solid pulmonary nodule the left upper lobe measuring 4 mm. No follow-up needed if patient is low-risk. Non-contrast chest CT can be considered in 12 months if patient is high-risk. Dilated common bile duct which measures up to 1.1 cm, slightly greater than expected status post cholecystectomy. Recommend correlation with liver function tests. If LFTs are abnormal, consider MRCP. Cystic lesion of the pancreatic head/uncinate process measuring up to 1.3 cm. Recommend follow up pre and post contrast MRI/MRCP or pancreatic protocol CT in 2 years.   Consultants: None    The patient was seen and examined by Dr. Ali Lowe who feels that she is stable and ready for discharged today, 03/17/21.  _____________  Discharge Vitals Blood pressure 119/62, pulse 96, temperature 98.9 F (37.2 C), temperature source Oral, resp. rate 15, height 5\' 4"  (1.626 m), weight 83 kg, SpO2 98 %.  Filed Weights   03/16/21 0554 03/17/21 0500  Weight: 83 kg 83 kg   General: Well developed, well nourished, NAD Neck: Negative for carotid bruits. No JVD Lungs:Clear to ausculation bilaterally. No wheezes, rales, or rhonchi. Breathing is unlabored. Cardiovascular: RRR with S1 S2. Soft flow murmur Abdomen: Soft, non-tender, non-distended. No obvious abdominal masses. Extremities: No edema. Neuro: Alert and oriented. No focal deficits. No facial asymmetry. MAE spontaneously. Psych: Responds to questions appropriately with normal affect.    Labs & Radiologic Studies    CBC Recent Labs     03/16/21 1041 03/17/21 0208  WBC  --  7.2  HGB 11.2* 10.6*  HCT 33.0* 32.2*  MCV  --  92.0  PLT  --  [id number]   Basic Metabolic Panel Recent Labs    03/16/21 1041 03/17/21 0208  NA 141 135  K 4.4 3.9  CL 106 103  CO2  --  23  GLUCOSE 116* 118*  BUN 18 15  CREATININE 1.10* 1.38*  CALCIUM  --  8.5*   Liver Function Tests No results for input(s): AST, ALT, ALKPHOS, BILITOT, PROT, ALBUMIN in the last 72 hours. No results for input(s): LIPASE, AMYLASE in the last 72 hours. Cardiac Enzymes No results for input(s): CKTOTAL, CKMB, CKMBINDEX, TROPONINI in the last 72 hours. BNP Invalid input(s): POCBNP D-Dimer No results for input(s): DDIMER in the last 72 hours. Hemoglobin A1C No results for input(s): HGBA1C in the  last 72 hours. Fasting Lipid Panel No results for input(s): CHOL, HDL, LDLCALC, TRIG, CHOLHDL, LDLDIRECT in the last 72 hours. Thyroid Function Tests No results for input(s): TSH, T4TOTAL, T3FREE, THYROIDAB in the last 72 hours.  Invalid input(s): FREET3 _____________  DG Chest 2 View  Result Date: 03/12/2021 CLINICAL DATA:  76 year old female with pre admission chest x-ray for TAVR EXAM: CHEST - 2 VIEW COMPARISON:  02/14/2015 FINDINGS: Cardiomediastinal silhouette unchanged in size and contour. No evidence of central vascular congestion. No interlobular septal thickening. Double density projects over the lower mediastinum, unchanged, compatible with hiatal hernia No pneumothorax or pleural effusion. Coarsened interstitial markings, with no confluent airspace disease. No acute displaced fracture. Degenerative changes of the spine. IMPRESSION: Negative for acute cardiopulmonary disease. Electronically Signed   By: Corrie Mckusick D.O.   On: 03/12/2021 11:58   CARDIAC CATHETERIZATION  Result Date: 02/17/2021 1.  Normal right dominant coronary artery circulation. 2.  Mean RA of 2 mmHg and mean wedge of 6 mmHg with cardiac output of 7.4 L/min and cardiac index of 4.0 L/min/m 3.   Right upper extremity tortuosity.   CT CORONARY MORPH W/CTA COR W/SCORE W/CA W/CM &/OR WO/CM  Addendum Date: 03/02/2021   ADDENDUM REPORT: 03/02/2021 17:46 CLINICAL DATA:  Severe Aortic Stenosis. EXAM: Cardiac TAVR CT TECHNIQUE: A non-contrast, gated CT scan was obtained with axial slices of 3 mm through the heart for aortic valve calcium scoring. A 120 kV retrospective, gated, contrast cardiac scan was obtained. Gantry rotation speed was 250 msecs and collimation was 0.6 mm. Nitroglycerin was not given. The 3D data set was reconstructed in 5% intervals of the 0-95% of the R-R cycle. Systolic and diastolic phases were analyzed on a dedicated workstation using MPR, MIP, and VRT modes. The patient received 100 cc of contrast. FINDINGS: Image quality: Excellent. Noise artifact is: Limited. Valve Morphology: The aortic valve is tricuspid with bulky calcification of the NCC. The leaflets demonstrate severely restricted movement in systole. Aortic Valve Calcium score: 1243 Aortic annular dimension: Phase assessed: 35% Annular area: 435 mm2 Annular perimeter: 25.9 mm Max diameter: 25.9 mm Min diameter: 22.1 mm Annular and subannular calcification: Trivial calcification under the Bevier. Membranous septum length: 7.4 mm Optimal coplanar projection: LAO 15 CRA 5 Coronary Artery Height above Annulus: Left Main: 12.2 mm Right Coronary: Sinus of Valsalva Measurements: Non-coronary: 29.1 mm Right-coronary: 27.4 mm Left-coronary: 31.3 mm Sinus of Valsalva Height: Non-coronary: 22.8 mm Right-coronary: 17.5 mm Left-coronary: 19.9 mm Sinotubular Junction: 31 mm Ascending Thoracic Aorta: 35 mm Coronary Arteries: Normal coronary origin. Right dominance. The study was performed without use of NTG and is insufficient for plaque evaluation. Please refer to recent cardiac catheterization for coronary assessment. Cardiac Morphology: Right Atrium: Right atrial size is within normal limits. Right Ventricle: The right ventricular cavity is  within normal limits. Left Atrium: Left atrial size is normal in size with no left atrial appendage filling defect. Left Ventricle: The ventricular cavity size is within normal limits. There are no stigmata of prior infarction. There is no abnormal filling defect. Normal left ventricular function, LVEF=68%. No regional wall motion abnormalities. Pulmonary arteries: Normal in size without proximal filling defect. Pulmonary veins: Normal pulmonary venous drainage. Pericardium: Normal thickness with no significant effusion or calcium present. Mitral Valve: The mitral valve is normal structure with mild annular calcium. Extra-cardiac findings: Large hiatal hernia. See attached radiology report for non-cardiac structures. IMPRESSION: 1. Tricuspid aortic valve with bulky calcification of the NCC. 2. Annular measurements in between 23/26 mm  S3 TAVR (435 mm2). Measurements do support a 29 mm Evolut Pro. Would recommend structural heart team discussion for valve size. 3. No significant annular or subannular calcifications. 4. Sufficient coronary to annulus distance. 5. Optimal Fluoroscopic Angle for Delivery: LAO 15 CRA 5 6. Large hiatal hernia. [place] T. Audie Box, MD Electronically Signed   By: Eleonore Chiquito M.D.   On: 03/02/2021 17:46   Result Date: 03/02/2021 EXAM: OVER-READ INTERPRETATION  CT CHEST The following report is an over-read performed by radiologist Dr. Yetta Glassman of [facility], [place] on 03/02/2021. This over-read does not include interpretation of cardiac or coronary anatomy or pathology. The coronary calcium score/coronary CTA interpretation by the cardiologist is attached. COMPARISON:  None. FINDINGS: Extracardiac findings will be described separately under dictation for contemporaneously obtained CTA chest, abdomen and pelvis. IMPRESSION: Please see separate dictation for contemporaneously obtained CTA chest, abdomen and pelvis dated 03/02/2021 for full description of relevant extracardiac  findings. Electronically Signed: By: Yetta Glassman M.D. On: 03/02/2021 13:27   CT ANGIO CHEST AORTA W/CM & OR WO/CM  Result Date: 03/02/2021 CLINICAL DATA:  Preop evaluation for transcatheter aortic valve replacement EXAM: CTA ABDOMEN AND PELVIS WITHOUT AND WITH CONTRAST TECHNIQUE: Multidetector CT imaging of the abdomen and pelvis was performed using the standard protocol during bolus administration of intravenous contrast. Multiplanar reconstructed images and MIPs were obtained and reviewed to evaluate the vascular anatomy. RADIATION DOSE REDUCTION: This exam was performed according to the departmental dose-optimization program which includes automated exposure control, adjustment of the mA and/or kV according to patient size and/or use of iterative reconstruction technique. CONTRAST:  37mL OMNIPAQUE IOHEXOL 350 MG/ML SOLN COMPARISON:  None. FINDINGS: CTA CHEST FINDINGS Cardiovascular: Normal heart size. No significant pericardial effusion/thickening. Mild coronary artery calcifications of the LAD. Great vessels are normal in course and caliber. No central pulmonary emboli. Mediastinum/Nodes: No discrete thyroid nodules. Large hiatal hernia. No pathologically enlarged axillary, mediastinal or hilar lymph nodes. Lungs/Pleura: No pneumothorax. No pleural effusion. Small solid pulmonary nodule the left upper lobe measuring 4 mm on series 5, image 55. Mild bibasilar atelectasis adjacent to large hiatal hernia. Musculoskeletal: No aggressive appearing focal osseous lesions. Mild compression deformity of T11. CTA ABDOMEN AND PELVIS FINDINGS Hepatobiliary: Normal liver with no liver mass. Surgically absent gallbladder dilated common bile duct, measuring up to 1.1 cm. No biliary ductal dilatation. Pancreas: No ductal dilation. Cystic lesion of the head/uncinate process measuring up to 1.3 cm on series 4 image 113. Spleen: Normal size. No mass. Peripheral wedge-shaped areas of hypoattenuation. Adrenals/Urinary  Tract: Normal adrenals. Kidneys enhance symmetrically with no evidence of hydronephrosis or nephrolithiasis. Normal bladder. Stomach/Bowel: Normal non-distended stomach. Normal caliber small bowel with no small bowel wall thickening. Mild sigmoid diverticulosis. No large bowel wall thickening or pericolonic fat stranding. Vascular/Lymphatic: Moderate atherosclerotic disease the abdominal aorta consisting of calcified and noncalcified plaque. Major aortic branch vessels are patent. Mild narrowing at the origin of the celiac artery due to diaphragmatic compression. No pathologically enlarged lymph nodes in the abdomen or pelvis. Reproductive: Uterus is present.  No adnexal masses. Other: No pneumoperitoneum, ascites or focal fluid collection. Musculoskeletal: No aggressive appearing focal osseous lesions. VASCULAR MEASUREMENTS PERTINENT TO TAVR: AORTA: Minimal Aortic Diameter-13.7 mm Severity of Aortic Calcification-mild RIGHT PELVIS: Right Common Iliac Artery - Minimal Diameter-8.8 mm Tortuosity-none Calcification-mild Right External Iliac Artery - Minimal Diameter-6.6 mm Tortuosity-mild Calcification-mild Right Common Femoral Artery - Minimal Diameter-6.9 mm Tortuosity-none Calcification-mild LEFT PELVIS: Left Common Iliac Artery - Minimal Diameter-9.2 mm Tortuosity-none Calcification-mild Left  External Iliac Artery - Minimal Diameter-8.9 mm Tortuosity-mild Calcification-mild Left Common Femoral Artery - Minimal Diameter-6.8 mm Tortuosity-none Calcification-mild Review of the MIP images confirms the above findings. IMPRESSION: Vascular: 1. Vascular findings and measurements pertinent to potential TAVR procedure, as detailed above. 2. Severe thickening calcification of the aortic valve, compatible with reported clinical history of severe aortic stenosis. 3. Moderate aortoiliac atherosclerosis. LAD coronary artery disease. Nonvascular: 1. Peripheral wedge-shaped areas of hypoenhancement seen in the spleen, likely  splenic infarcts. 2. Small solid pulmonary nodule the left upper lobe measuring 4 mm. No follow-up needed if patient is low-risk. Non-contrast chest CT can be considered in 12 months if patient is high-risk. This recommendation follows the consensus statement: Guidelines for Management of Incidental Pulmonary Nodules Detected on CT Images: From the Fleischner Society 2017; Radiology 2017; 284:228-243. 3. Dilated common bile duct which measures up to 1.1 cm, slightly greater than expected status post cholecystectomy. Recommend correlation with liver function tests. If LFTs are abnormal, consider MRCP. 4. Cystic lesion of the pancreatic head/uncinate process measuring up to 1.3 cm. Recommend follow up pre and post contrast MRI/MRCP or pancreatic protocol CT in 2 years. This recommendation follows ACR consensus guidelines: Management of Incidental Pancreatic Cysts: A White Paper of the ACR Incidental Findings Committee. [place] [id number]. Electronically Signed   By: Yetta Glassman M.D.   On: 03/02/2021 14:14   ECHOCARDIOGRAM COMPLETE  Result Date: 03/17/2021    ECHOCARDIOGRAM REPORT   Patient Name:   JESILYN WALTERMIRE Date of Exam: 03/17/2021 Medical Rec #:  [id number]  Height:       64.0 in Accession #:    [id number] Weight:       183.0 lb Date of Birth:  March 21, 1945  BSA:          1.884 m Patient Age:    13 years   BP:           119/62 mmHg Patient Gender: F          HR:           100 bpm. Exam Location:  Inpatient Procedure: 2D Echo Indications:    Post TAVR  History:        Patient has prior history of Echocardiogram examinations, most                 recent 03/16/2021. Risk Factors:Diabetes.                 Aortic Valve: CoreValve-Evolut Pro prosthetic, stented (TAVR)                 valve is present in the aortic position. Procedure Date:                 03/16/2021.  Sonographer:    Jefferey Pica Referring Phys: [phone] JILL D MCDANIEL IMPRESSIONS  1. Left ventricular ejection fraction, by estimation, is  55 to 60%. The left ventricle has normal function. The left ventricle has no regional wall motion abnormalities. Left ventricular diastolic parameters are indeterminate.  2. Right ventricular systolic function is normal. The right ventricular size is normal. There is mildly elevated pulmonary artery systolic pressure.  3. Left atrial size was moderately dilated.  4. The mitral valve is degenerative. Mild mitral valve regurgitation. No evidence of mitral stenosis. Moderate mitral annular calcification.  5. Well seated bioprosthetic valve. The aortic valve has been repaired/replaced. Aortic valve regurgitation Mild aortic regurgitation with trivial paravalvular leak at the 1oclock position. No aortic stenosis is  present. There is a CoreValve-Evolut Pro prosthetic (TAVR) valve present in the aortic position. Procedure Date: 03/16/2021. Aortic valve mean gradient measures 9.0 mmHg. Aortic valve Vmax measures 2.09 m/s.  6. The inferior vena cava is normal in size with greater than 50% respiratory variability, suggesting right atrial pressure of 3 mmHg. FINDINGS  Left Ventricle: Left ventricular ejection fraction, by estimation, is 55 to 60%. The left ventricle has normal function. The left ventricle has no regional wall motion abnormalities. The left ventricular internal cavity size was normal in size. There is  no left ventricular hypertrophy. Left ventricular diastolic function could not be evaluated due to mitral annular calcification (moderate or greater). Left ventricular diastolic parameters are indeterminate. Right Ventricle: The right ventricular size is normal. No increase in right ventricular wall thickness. Right ventricular systolic function is normal. There is mildly elevated pulmonary artery systolic pressure. The tricuspid regurgitant velocity is 2.90  m/s, and with an assumed right atrial pressure of 10 mmHg, the estimated right ventricular systolic pressure is [id number] mmHg. Left Atrium: Left atrial size was  moderately dilated. Right Atrium: Right atrial size was normal in size. Pericardium: There is no evidence of pericardial effusion. Presence of epicardial fat layer. Mitral Valve: The mitral valve is degenerative in appearance. Moderate mitral annular calcification. Mild mitral valve regurgitation. No evidence of mitral valve stenosis. Tricuspid Valve: The tricuspid valve is normal in structure. Tricuspid valve regurgitation is mild . No evidence of tricuspid stenosis. Aortic Valve: Well seated bioprosthetic valve. The aortic valve has been repaired/replaced. Aortic valve regurgitation Mild aortic regurgitation with trivial paravalvular leak at the 1oclock position. No aortic stenosis is present. Aortic valve mean gradient measures 9.0 mmHg. Aortic valve peak gradient measures 17.5 mmHg. There is a CoreValve-Evolut Pro prosthetic, stented (TAVR) valve present in the aortic position. Procedure Date: 03/16/2021. Pulmonic Valve: The pulmonic valve was normal in structure. Pulmonic valve regurgitation is not visualized. No evidence of pulmonic stenosis. Aorta: The aortic root is normal in size and structure. Venous: The inferior vena cava is normal in size with greater than 50% respiratory variability, suggesting right atrial pressure of 3 mmHg. IAS/Shunts: No atrial level shunt detected by color flow Doppler.  LEFT VENTRICLE PLAX 2D LVIDd:         3.20 cm Diastology LVIDs:         1.70 cm LV e' medial:    5.27 cm/s LV PW:         1.20 cm LV E/e' medial:  16.6 LV IVS:        1.40 cm LV e' lateral:   7.21 cm/s                        LV E/e' lateral: 12.2  RIGHT VENTRICLE             IVC RV Basal diam:  2.70 cm     IVC diam: 1.30 cm RV S prime:     20.10 cm/s TAPSE (M-mode): 2.5 cm LEFT ATRIUM             Index        RIGHT ATRIUM           Index LA Vol (A2C):   58.8 ml 31.21 ml/m  RA Area:     12.20 cm LA Vol (A4C):   96.1 ml 51.01 ml/m  RA Volume:   29.60 ml  15.71 ml/m LA Biplane Vol: 81.3 ml 43.16 ml/m  AORTIC  VALVE  PULMONIC VALVE AV Vmax:           209.00 cm/s  PV Vmax:       1.08 m/s AV Vmean:          135.000 cm/s PV Peak grad:  4.7 mmHg AV VTI:            0.338 m AV Peak Grad:      17.5 mmHg AV Mean Grad:      9.0 mmHg LVOT Vmax:         149.00 cm/s LVOT Vmean:        104.000 cm/s LVOT VTI:          0.284 m LVOT/AV VTI ratio: 0.84 MITRAL VALVE                TRICUSPID VALVE MV Area (PHT): 5.66 cm     TR Peak grad:   33.6 mmHg MV Decel Time: 134 msec     TR Vmax:        290.00 cm/s MV E velocity: 87.70 cm/s MV A velocity: 127.00 cm/s  SHUNTS MV E/A ratio:  0.69         Systemic VTI: 0.28 m Godfrey Pick Tobb DO Electronically signed by Berniece Salines DO Signature Date/Time: 03/17/2021/11:06:37 AM    Final    ECHOCARDIOGRAM LIMITED  Result Date: 03/16/2021    ECHOCARDIOGRAM LIMITED REPORT   Patient Name:   JERLDINE ROLISON [facility] Date of Exam: 03/16/2021 Medical Rec #:  [id number]    Height:       64.0 in Accession #:    [id number]   Weight:       183.0 lb Date of Birth:  1945-08-05    BSA:          1.884 m Patient Age:    65 years     BP:           157/73 mmHg Patient Gender: F            HR:           64 bpm. Exam Location:  Inpatient Procedure: Limited Echo, Cardiac Doppler and Color Doppler Indications:     Aortic stenosis I35.0  History:         Patient has prior history of Echocardiogram examinations, most                  recent 12/03/2020. Aortic Valve Disease. GERD.                  Aortic Valve: 29 mm CoreValve-Evolut Pro prosthetic, stented                  (TAVR) valve is present in the aortic position. Procedure Date:                  03/16/2021.  Sonographer:     Darlina Sicilian RDCS Referring Phys:  [id number] Early [place] Diagnosing Phys: Jenkins Rouge MD IMPRESSIONS  1. Left ventricular ejection fraction, by estimation, is 55 to 60%. The left ventricle has normal function. There is moderate left ventricular hypertrophy.  2. Right ventricular systolic function is normal. The right ventricular size is  normal.  3. The mitral valve is degenerative. Mild mitral valve regurgitation. Moderate mitral annular calcification.  4. Pre TAVR severely calcified tri leaflet AV mean gradient 27 peak 41 mmHg AVA 0.95 cm2 mild AR         Post TAVR well placed 29 mm Medtronic supra annular Evolut  Pro valve Initially moderate PVL Post dilated with 25 mm balloon Post balloon AVA 3.0 cm2 peak gradient 5 mean 2 mmHg Two areas of PVL overall mild and improved from pre balloon regurgitation. The aortic valve has been repaired/replaced. Aortic valve regurgitation is mild. There is a 29 mm CoreValve-Evolut Pro prosthetic (TAVR) valve present in the aortic position. Procedure Date: 03/16/2021.  5. Aortic dilatation noted. FINDINGS  Left Ventricle: Left ventricular ejection fraction, by estimation, is 55 to 60%. The left ventricle has normal function. The left ventricular internal cavity size was normal in size. There is moderate left ventricular hypertrophy. Right Ventricle: The right ventricular size is normal. No increase in right ventricular wall thickness. Right ventricular systolic function is normal. Pericardium: There is no evidence of pericardial effusion. Mitral Valve: The mitral valve is degenerative in appearance. There is mild thickening of the mitral valve leaflet(s). There is mild calcification of the mitral valve leaflet(s). Moderate mitral annular calcification. Mild mitral valve regurgitation. Aortic Valve: Pre TAVR severely calcified tri leaflet AV mean gradient 27 peak 41 mmHg AVA 0.95 cm2 mild AR Post TAVR well placed 29 mm Medtronic supra annular Evolut Pro valve Initially moderate PVL Post dilated with 25 mm balloon Post balloon AVA 3.0 cm2 peak gradient 5 mean 2 mmHg Two areas of PVL overall mild and improved from pre balloon regurgitation. The aortic valve has been repaired/replaced. Aortic valve regurgitation is mild. Aortic valve mean gradient measures 2.0 mmHg. Aortic valve peak gradient measures 4.6 mmHg. Aortic  valve area, by VTI measures 2.98 cm. There is a 29 mm CoreValve-Evolut Pro prosthetic, stented (TAVR) valve present in the aortic position. Procedure Date: 03/16/2021. Aorta: Aortic dilatation noted. LEFT VENTRICLE PLAX 2D LVOT diam:     2.40 cm LV SV:         84 LV SV Index:   44 LVOT Area:     4.52 cm  AORTIC VALVE AV Area (Vmax):    3.58 cm AV Area (Vmean):   3.40 cm AV Area (VTI):     2.98 cm AV Vmax:           107.00 cm/s AV Vmean:          70.700 cm/s AV VTI:            0.281 m AV Peak Grad:      4.6 mmHg AV Mean Grad:      2.0 mmHg LVOT Vmax:         84.60 cm/s LVOT Vmean:        53.200 cm/s LVOT VTI:          0.185 m LVOT/AV VTI ratio: 0.66  AORTA Ao Asc diam: 3.80 cm  SHUNTS Systemic VTI:  0.18 m Systemic Diam: 2.40 cm Jenkins Rouge MD Electronically signed by Jenkins Rouge MD Signature Date/Time: 03/16/2021/9:43:55 AM    Final    Structural Heart Procedure  Result Date: 03/16/2021 See surgical note for result.  CT ANGIO ABDOMEN PELVIS  W &/OR WO CONTRAST  Result Date: 03/02/2021 CLINICAL DATA:  Preop evaluation for transcatheter aortic valve replacement EXAM: CTA ABDOMEN AND PELVIS WITHOUT AND WITH CONTRAST TECHNIQUE: Multidetector CT imaging of the abdomen and pelvis was performed using the standard protocol during bolus administration of intravenous contrast. Multiplanar reconstructed images and MIPs were obtained and reviewed to evaluate the vascular anatomy. RADIATION DOSE REDUCTION: This exam was performed according to the departmental dose-optimization program which includes automated exposure control, adjustment of the mA and/or kV according to patient  size and/or use of iterative reconstruction technique. CONTRAST:  25mL OMNIPAQUE IOHEXOL 350 MG/ML SOLN COMPARISON:  None. FINDINGS: CTA CHEST FINDINGS Cardiovascular: Normal heart size. No significant pericardial effusion/thickening. Mild coronary artery calcifications of the LAD. Great vessels are normal in course and caliber. No central  pulmonary emboli. Mediastinum/Nodes: No discrete thyroid nodules. Large hiatal hernia. No pathologically enlarged axillary, mediastinal or hilar lymph nodes. Lungs/Pleura: No pneumothorax. No pleural effusion. Small solid pulmonary nodule the left upper lobe measuring 4 mm on series 5, image 55. Mild bibasilar atelectasis adjacent to large hiatal hernia. Musculoskeletal: No aggressive appearing focal osseous lesions. Mild compression deformity of T11. CTA ABDOMEN AND PELVIS FINDINGS Hepatobiliary: Normal liver with no liver mass. Surgically absent gallbladder dilated common bile duct, measuring up to 1.1 cm. No biliary ductal dilatation. Pancreas: No ductal dilation. Cystic lesion of the head/uncinate process measuring up to 1.3 cm on series 4 image 113. Spleen: Normal size. No mass. Peripheral wedge-shaped areas of hypoattenuation. Adrenals/Urinary Tract: Normal adrenals. Kidneys enhance symmetrically with no evidence of hydronephrosis or nephrolithiasis. Normal bladder. Stomach/Bowel: Normal non-distended stomach. Normal caliber small bowel with no small bowel wall thickening. Mild sigmoid diverticulosis. No large bowel wall thickening or pericolonic fat stranding. Vascular/Lymphatic: Moderate atherosclerotic disease the abdominal aorta consisting of calcified and noncalcified plaque. Major aortic branch vessels are patent. Mild narrowing at the origin of the celiac artery due to diaphragmatic compression. No pathologically enlarged lymph nodes in the abdomen or pelvis. Reproductive: Uterus is present.  No adnexal masses. Other: No pneumoperitoneum, ascites or focal fluid collection. Musculoskeletal: No aggressive appearing focal osseous lesions. VASCULAR MEASUREMENTS PERTINENT TO TAVR: AORTA: Minimal Aortic Diameter-13.7 mm Severity of Aortic Calcification-mild RIGHT PELVIS: Right Common Iliac Artery - Minimal Diameter-8.8 mm Tortuosity-none Calcification-mild Right External Iliac Artery - Minimal Diameter-6.6 mm  Tortuosity-mild Calcification-mild Right Common Femoral Artery - Minimal Diameter-6.9 mm Tortuosity-none Calcification-mild LEFT PELVIS: Left Common Iliac Artery - Minimal Diameter-9.2 mm Tortuosity-none Calcification-mild Left External Iliac Artery - Minimal Diameter-8.9 mm Tortuosity-mild Calcification-mild Left Common Femoral Artery - Minimal Diameter-6.8 mm Tortuosity-none Calcification-mild Review of the MIP images confirms the above findings. IMPRESSION: Vascular: 1. Vascular findings and measurements pertinent to potential TAVR procedure, as detailed above. 2. Severe thickening calcification of the aortic valve, compatible with reported clinical history of severe aortic stenosis. 3. Moderate aortoiliac atherosclerosis. LAD coronary artery disease. Nonvascular: 1. 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 2. Small solid pulmonary nodule the left upper lobe measuring 4 mm. No follow-up needed if patient is low-risk. Non-contrast chest CT can be considered in 12 months if patient is high-risk. This recommendation follows the consensus statement: Guidelines for Management of Incidental Pulmonary Nodules Detected on CT Images: From the Fleischner Society 2017; Radiology 2017; 284:228-243. 3. Dilated common bile duct which measures up to 1.1 cm, slightly greater than expected status post cholecystectomy. Recommend correlation with liver function tests. If LFTs are abnormal, consider MRCP. 4. Cystic lesion of the pancreatic head/uncinate process measuring up to 1.3 cm. Recommend follow up pre and post contrast MRI/MRCP or pancreatic protocol CT in 2 years. This recommendation follows ACR consensus guidelines: Management of Incidental Pancreatic Cysts: A White Paper of the ACR Incidental Findings Committee. [place] [id number]. Electronically Signed   By: Yetta Glassman M.D.   On: 03/02/2021 14:14   Disposition   Pt is being discharged home today  in good condition.  Follow-up Plans & Appointments    Follow-up Information     [place] Silos,  Woodfin Ganja, PA-C Follow up on 03/24/2021.   Specialties: Cardiology, Radiology Why: at 2:30pm. Please arrive at 2:15pm Contact information: [address] [place] [address] [phone]                Discharge Instructions     Call MD for:  difficulty breathing, headache or visual disturbances   Complete by: As directed    Call MD for:  extreme fatigue   Complete by: As directed    Call MD for:  hives   Complete by: As directed    Call MD for:  persistant dizziness or light-headedness   Complete by: As directed    Call MD for:  persistant nausea and vomiting   Complete by: As directed    Call MD for:  redness, tenderness, or signs of infection (pain, swelling, redness, odor or green/yellow discharge around incision site)   Complete by: As directed    Call MD for:  severe uncontrolled pain   Complete by: As directed    Call MD for:  temperature >100.4   Complete by: As directed    Diet - low sodium heart healthy   Complete by: As directed    Discharge instructions   Complete by: As directed    Please do not restart your Metformin until tomorrow, 03/18/21.   ACTIVITY AND EXERCISE  Daily activity and exercise are an important part of your recovery. People recover at different rates depending on their general health and type of valve procedure.  Most people recovering from TAVR feel better relatively quickly   No lifting, pushing, pulling more than 10 pounds (examples to avoid: groceries, vacuuming, gardening, golfing):             - For one week with a procedure through the groin.             - For six weeks for procedures through the chest wall or neck. NOTE: You will typically see one of our providers 7-14 days after your procedure to discuss [place] the above activities.      DRIVING  Do not drive until you are seen for follow up and cleared by a  provider. Generally, we ask patient to not drive for 1 week after their procedure.  If you have been told by your doctor in the past that you may not drive, you must talk with him/her before you begin driving again.   DRESSING  Groin site: you may leave the clear dressing over the site for up to one week or until it falls off.   HYGIENE  If you had a femoral (leg) procedure, you may take a shower when you return home. After the shower, pat the site dry. Do NOT use powder, oils or lotions in your groin area until the site has completely healed.  If you had a chest procedure, you may shower when you return home unless specifically instructed not to by your discharging practitioner.             - DO NOT scrub incision; pat dry with a towel.             - DO NOT apply any lotions, oils, powders to the incision.             - No tub baths / swimming for at least 2 weeks.  If you notice any fevers, chills, increased pain, swelling, bleeding or pus, please contact your doctor.   ADDITIONAL INFORMATION  If you are going to have an upcoming dental procedure, please contact our office as you will require antibiotics ahead of time to prevent infection on your heart valve.    If you have any questions or concerns you can call the structural heart phone during normal business hours 8am-4pm. If you have an urgent need after hours or weekends please call [phone] to talk to the on call provider for general cardiology. If you have an emergency that requires immediate attention, please call 911.    After TAVR Checklist  Check  Test Description  Follow up appointment in 1-2 weeks  You will see our structural heart advanced practice provider. Your incision sites will be checked and you will be cleared to drive and resume all normal activities if you are doing well.    1 month echo and follow up  You will have an echo to check on your new heart valve and be seen back in the office by a structural  heart advanced practice provider.  Follow up with your primary cardiologist You will need to be seen by your primary cardiologist in the following 3-6 months after your 1 month appointment in the valve clinic.   1 year echo and follow up You will have another echo to check on your heart valve after 1 year and be seen back in the office by a structural heart advanced practice provider. This your last structural heart visit.  Bacterial endocarditis prophylaxis  You will have to take antibiotics for the rest of your life before all dental procedures (even teeth cleanings) to protect your heart valve. Antibiotics are also required before some surgeries. Please check with your cardiologist before scheduling any surgeries. Also, please make sure to tell us if you have a penicillin allergy as you will require an alternative antibiotic.   If the dressing is still on your incision site when you go home, remove it on the third day after your surgery date. Remove dressing if it begins to fall off, or if it is dirty or damaged before the third day.   Complete by: As directed    Increase activity slowly   Complete by: As directed        Discharge Medications   Allergies as of 03/17/2021       Reactions   Sulfa Antibiotics Nausea And Vomiting, Other (See Comments)   Tizanidine Hcl Other (See Comments)   Pt does not remember         Medication List     TAKE these medications    acetaminophen 650 MG CR tablet Commonly known as: TYLENOL Take 1,300 mg by mouth every 8 (eight) hours.   alendronate 70 MG tablet Commonly known as: FOSAMAX Take 1 tablet by mouth every Thursday.   aspirin EC 81 MG tablet Take 81 mg by mouth daily. Swallow whole.   atorvastatin 10 MG tablet Commonly known as: LIPITOR Take 10 mg by mouth at bedtime.   buPROPion 300 MG 24 hr tablet Commonly known as: WELLBUTRIN XL Take 300 mg by mouth daily after breakfast.   calcium carbonate 600 MG Tabs tablet Commonly known  as: OS-CAL Take 600 mg by mouth 2 (two) times daily with a meal.   cyclobenzaprine 10 MG tablet Commonly known as: FLEXERIL Take 10 mg by mouth at bedtime.   DULoxetine 30 MG capsule Commonly known as: CYMBALTA Take 90 mg by mouth daily after breakfast.   gabapentin 300 MG capsule Commonly known as: NEURONTIN Take 300 mg  by mouth 2 (two) times daily.   Iron (Ferrous Sulfate) 325 (65 Fe) MG Tabs Take 325 mg by mouth daily.   Lurasidone HCl 60 MG Tabs Take 60 mg by mouth daily with supper. [name]   metFORMIN 500 MG tablet Commonly known as: GLUCOPHAGE Take 500 mg by mouth 2 (two) times daily.   metoprolol succinate 25 MG 24 hr tablet Commonly known as: TOPROL-XL TAKE 1 TABLET(25 MG) BY MOUTH IN THE MORNING AND AT BEDTIME   traMADol 50 MG tablet Commonly known as: ULTRAM Take 50 mg by mouth at bedtime.   vitamin B-12 1000 MCG tablet Commonly known as: CYANOCOBALAMIN Take 1,000 mcg by mouth daily.               Discharge Care Instructions  (From admission, onward)           Start     Ordered   03/17/21 0000  If the dressing is still on your incision site when you go home, remove it on the third day after your surgery date. Remove dressing if it begins to fall off, or if it is dirty or damaged before the third day.        03/17/21 1202             Outstanding Labs/Studies   BMET   Duration of Discharge Encounter   Greater than 30 minutes including physician time.  SignedKathyrn Drown, NP 03/17/2021, 12:03 PM [phone]   ATTENDING ATTESTATION:  After conducting a review of all available clinical information with the care team, interviewing the patient, and performing a physical exam, I agree with the findings and plan described in this note.   GEN: No acute distress.   Cardiac: RRR, no murmurs, rubs, or gallops.  Respiratory: Clear to auscultation bilaterally. GI: Soft, nontender, non-distended  MS: No edema; No deformity. Neuro:   Nonfocal  Vasc:  +2 radial pulses  Patient doing very well after uncomplicated TAVR.  Discharge today with follow-up in cardiac rehab.  Lenna Sciara, MD Pager [phone]

## 2021-03-17 NOTE — Op Note (Signed)
[facility]   MULTIDISCIPLINARY HEART VALVE TEAM     TAVR OPERATIVE NOTE    Nicole Graves [id number]  Date of Procedure:                 1/31//2023   Preoperative Diagnosis:      Severe Aortic Stenosis    Postoperative Diagnosis:    Same    Procedure:        Transcatheter Aortic Valve Replacement - Percutaneous Right Transfemoral Approach             Medtronic Evolut FX  (size 29 mm, model # EVOLUTFX-29  serial # [id number])              Co-Surgeons:            Gaye Pollack, MD and Lenna Sciara, MD     Anesthesiologist:                  Perfecto Kingdom, MD   Echocardiographer:              Edmonia James, MD   Pre-operative Echo Findings: Severe aortic stenosis and moderate AI  Normal left ventricular systolic function   Post-operative Echo Findings: Trace paravalvular leak Normal left ventricular systolic function      BRIEF CLINICAL NOTE AND INDICATIONS FOR SURGERY    This 76 year old woman has stage D, severe, symptomatic low-flow/low gradient aortic stenosis with New York Heart Association class II-lll symptoms of exertional fatigue and shortness of breath consistent with chronic diastolic congestive heart failure. She has also had recurrent episodes of dizziness with a couple falls over the last few months. I have personally reviewed her 2D echocardiogram, cardiac catheterization, and CTA studies. Her echo in October 2022 showed a severely calcified aortic valve with thickened leaflets and poor mobility. The mean gradient was measured at 25 mmHg with an aortic valve area of 0.71 cm and dimensionless index of 0.25. Her valve looks severely stenotic. Stroke-volume index was low at 28. Left ventricular systolic function was normal. Cardiac catheterization showed no coronary disease. I agree that aortic valve replacement is indicated in this patient for relief of her symptoms and to prevent progressive left ventricular deterioration. Her gated cardiac CTA shows  anatomy suitable for TAVR using a SAPIEN 3 or Medtronic valve. Her gated cardiac CTA shows anatomy suitable for transfemoral insertion.  The patient and her daughter were counseled at length regarding treatment alternatives for management of severe symptomatic aortic stenosis. The risks and benefits of surgical intervention has been discussed in detail. Long-term prognosis with medical therapy was discussed. Alternative approaches such as conventional surgical aortic valve replacement, transcatheter aortic valve replacement, and palliative medical therapy were compared and contrasted at length. This discussion was placed in the context of the patient's own specific clinical presentation and past medical history. All of their questions have been addressed.  Following the decision to proceed with transcatheter aortic valve replacement, a discussion was held regarding what types of management strategies would be attempted intraoperatively in the event of life-threatening complications, including whether or not the patient would be considered a candidate for the use of cardiopulmonary bypass and/or conversion to open sternotomy for attempted surgical intervention. I think she would be a candidate for emergent sternotomy to manage any intraoperative complications. The patient is aware of the fact that transient use of cardiopulmonary bypass may be necessary. The patient has been advised of a variety of complications that might develop including but  not limited to risks of death, stroke, paravalvular leak, aortic dissection or other major vascular complications, aortic annulus rupture, device embolization, cardiac rupture or perforation, mitral regurgitation, acute myocardial infarction, arrhythmia, heart block or bradycardia requiring permanent pacemaker placement, congestive heart failure, respiratory failure, renal failure, pneumonia, infection, other late complications related to structural valve deterioration or  migration, or other complications that might ultimately cause a temporary or permanent loss of functional independence or other long term morbidity. The patient provides full informed consent for the procedure as described and all questions were answered.        DETAILS OF THE OPERATIVE PROCEDURE   PREPARATION:     The patient is brought to the operating room on the above mentioned date and central monitoring was established by the anesthesia team including placement of a radial arterial line. The patient is placed in the supine position on the operating table.  Intravenous antibiotics are administered. The procedure was performed under conscious sedation monitored by the anesthesia team   Baseline transthoracic echocardiogram was performed. The patient's abdomen and both groins were prepared and draped in a sterile manner. A time out procedure is performed.     PERIPHERAL ACCESS:     Using ultrasound guidance and modified Seldinger technique, femoral arterial and venous access was obtained with placement of 6 Fr sheaths on the left side.  A pigtail diagnostic catheter was passed through the left arterial sheath under fluoroscopic guidance into the aortic root.  A temporary transvenous pacemaker catheter was passed through the left femoral venous sheath under fluoroscopic guidance into the right ventricle.  The pacemaker was tested to ensure stable lead placement and pacemaker capture.      TRANSFEMORAL ACCESS:    Percutaneous transfemoral access and sheath placement was performed using ultrasound guidance.  The right common femoral artery was cannulated using a micropuncture needle.  A pair of Abbott Perclose percutaneous closure devices were placed and a 6 French sheath replaced into the femoral artery.  The patient was heparinized systemically and ACT verified > 250 seconds.     An 8 F sheath was introduced into the right femoral artery An AL-1 catheter was used to direct a straight-tip  exchange length wire across the native aortic valve into the left ventricle. This was exchanged out for a pigtail catheter and position was confirmed in the LV apex. Simultaneous LV and Ao pressures were recorded.  The pigtail catheter was exchanged for a Safari wire in the LV apex.        TRANSCATHETER HEART VALVE DEPLOYMENT:    A Medtronic Evolut FX transcatheter heart valve (size 29 mm) was prepared and loaded into Medtronic delivery catheter system per manufacturer's guidelines and the proper orientation of the valve is confirmed under fluoroscopy. The 72F shealth was removed and the delivery system and inline sheath were inserted into the right common femoral artery over the [facility] wire and the inline sheath advanced into the abdominal aorta under fluoroscopic guidance. The delivery catheter was advanced around the aortic arch and the valve was carefully positioned across the aortic valve annulus. An aortic root injection was performed to confirm position and the valve deployed using the normal technique under fluoroscopic guidance. We used cusp overlap and LAO projections. Intermittent pacing was used during valve deployment. The delivery system and guidewire were retracted into the descending aorta and the nosecone re-sheathed. Valve function is assessed using echocardiography. There is felt to be moderate paravalvular leak and no central aortic insufficiency. The patient's hemodynamic  recovery following valve deployment is good.     BALLOON AORTIC VALVULOPLASTY:    We performed post dilation of the valve using a 25 mm True balloon during rapid pacing. Follow-up echo images showed a decrease in the paravalvular leak to trivial with normal valve function and normal LV function, no pericardial effusion.     PROCEDURE COMPLETION:    The delivery system and in-line sheath were removed and femoral artery closure performed using the Perclose devices.  Protamine was administered once femoral arterial  repair was complete. The temporary pacemaker, pigtail catheter and femoral sheaths were removed with manual pressure used for hemostasis. A Mynx closure device was used for the left femoral arterial sheath.    The patient tolerated the procedure well and is transported to the cath lab recovery area in stable condition. There were no immediate intraoperative complications. All sponge, instrument and needle counts are verified correct at completion of the operation.    No blood products were administered during the operation.   The patient received a total of 109 mL of intravenous contrast during the procedure.     Gaye Pollack, MD

## 2021-03-17 NOTE — Progress Notes (Signed)
Echocardiogram 2D Echocardiogram has been performed.  Nicole Graves 03/17/2021, 9:23 AM

## 2021-03-18 ENCOUNTER — Telehealth: Payer: Self-pay | Admitting: Physician Assistant

## 2021-03-18 ENCOUNTER — Other Ambulatory Visit (HOSPITAL_COMMUNITY): Payer: Medicare PPO

## 2021-03-18 NOTE — Telephone Encounter (Signed)
°  [facility]   MULTIDISCIPLINARY HEART VALVE TEAM   Patient contacted regarding discharge from [facility] on 2/1  Patient understands to follow up with a structural heart APP on 2/8 at [address] [facility].  Patient understands discharge instructions? yes Patient understands medications and regimen? yes Patient understands to bring all medications to this visit? yes  Cline Crock PA-C  [facility]

## 2021-03-18 NOTE — Progress Notes (Signed)
It's [employer]

## 2021-03-21 ENCOUNTER — Telehealth: Payer: Self-pay | Admitting: Student

## 2021-03-21 NOTE — Telephone Encounter (Signed)
° ° ° °  The patient's daughter called the after-hours line reporting that she did have slight oozing along her right groin site. Denies any swelling to the area or excessive blood loss. Says this occurred after she took a shower today. They have applied a bandage with improvement. We reviewed they should apply a pressure gauze to the area and she should limit her activity until this improves. I suspect this is superficial based off their description but we did review to monitor for excessive bleeding or swelling to the area as this would indicate she needs emergent evaluation. They voiced understanding of this and were appreciative of the return call.  I will route this note to the Structural Heart Team and triage so they can follow-up with the patient tomorrow as she may benefit from a nurse visit for assessment of the site if still having oozing.  Signed, Erma Heritage, PA-C 03/21/2021, 4:59 PM Pager: [phone]

## 2021-03-22 ENCOUNTER — Telehealth (HOSPITAL_COMMUNITY): Payer: Self-pay

## 2021-03-22 MED FILL — Heparin Sodium (Porcine) Inj 1000 Unit/ML: Qty: 1000 | Status: AC

## 2021-03-22 MED FILL — Potassium Chloride Inj 2 mEq/ML: INTRAVENOUS | Qty: 40 | Status: AC

## 2021-03-22 MED FILL — Magnesium Sulfate Inj 50%: INTRAMUSCULAR | Qty: 10 | Status: AC

## 2021-03-22 NOTE — Telephone Encounter (Signed)
Pt insurance is active and benefits verified through [employer]. Co-pay $20.00, DED $0.00/$0.00 met, out of pocket $3,300.00/$385.00 met, co-insurance 0%. No pre-authorization required. Passport, 03/22/21 @ 3:13PM, REF#[id number] °  °Will contact patient to see if she is interested in the Cardiac Rehab Program. If interested, patient will need to complete follow up appt. Once completed, patient will be contacted for scheduling upon review by the RN Navigator. °

## 2021-03-22 NOTE — Telephone Encounter (Signed)
Called and spoke with pt in regards to CR, pt stated she is not interested at this time.   Closed referral 

## 2021-03-22 NOTE — Telephone Encounter (Signed)
Check in on patient and oozing has improved with instructions by Randall An.

## 2021-03-24 ENCOUNTER — Other Ambulatory Visit: Payer: Self-pay

## 2021-03-24 ENCOUNTER — Ambulatory Visit: Payer: Medicare PPO | Admitting: Physician Assistant

## 2021-03-24 VITALS — BP 100/70 | HR 74 | Ht 64.0 in | Wt 182.0 lb

## 2021-03-24 DIAGNOSIS — K838 Other specified diseases of biliary tract: Secondary | ICD-10-CM | POA: Diagnosis not present

## 2021-03-24 DIAGNOSIS — I471 Supraventricular tachycardia: Secondary | ICD-10-CM

## 2021-03-24 DIAGNOSIS — N1831 Chronic kidney disease, stage 3a: Secondary | ICD-10-CM | POA: Diagnosis not present

## 2021-03-24 DIAGNOSIS — R911 Solitary pulmonary nodule: Secondary | ICD-10-CM

## 2021-03-24 DIAGNOSIS — Z952 Presence of prosthetic heart valve: Secondary | ICD-10-CM | POA: Diagnosis not present

## 2021-03-24 DIAGNOSIS — K869 Disease of pancreas, unspecified: Secondary | ICD-10-CM

## 2021-03-24 MED ORDER — AMOXICILLIN 500 MG PO TABS: ORAL_TABLET | ORAL | 6 refills | Status: DC

## 2021-03-24 NOTE — Patient Instructions (Addendum)
Medication Instructions:  Start Amoxicillin 500 mg, take 4 tablets by mouth 1 hour prior to dental procedures and cleanings   *If you need a refill on your cardiac medications before your next appointment, please call your pharmacy*   Lab Work: Bmp - today   If you have labs (blood work) drawn today and your tests are completely normal, you will receive your results only by: MyChart Message (if you have MyChart) OR A paper copy in the mail If you have any lab test that is abnormal or we need to change your treatment, we will call you to review the results.   Testing/Procedures: None ordered today    Follow-Up: Follow up as scheduled    Other Instructions None

## 2021-03-24 NOTE — Progress Notes (Signed)
[facility] [place]                                     Cardiology Office Note:    Date:  03/24/2021   ID:  Nicole Graves, DOB [birth year], MRN [id number]  PCP:  Kathyrn Lass, MD  [facility] Cardiologist:  Fransico Him, MD / Dr. Ali Lowe, MD & Dr. Cyndia Bent, MD (TAVR) [facility] Electrophysiologist:  None   Referring MD: Kathyrn Lass, MD   [facility] s/p TAVR  History of Present Illness:    Nicole Graves is a 76 y.o. 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  She has a history of aortic stenosis that recently progressed to the severe range. She complained of SOB and dizziness with minimal exertion. Pre TAVR [facility] 02/17/21 showed no CAD with right dominance.   She was evaluated by the multidisciplinary valve team and underwent successful TAVR with a 29 mm Medtronic Evolut Pro-Plus via the TF approach on 03/16/21. Post op echo showed EF 55%, normally functioning TAVR with a mean gradient of 9 mmHg and trivial paravalvular leak at the 1oclock position. She was discharged on aspirin monotherapy.   Today the patient presents to clinic for follow up. She is here with her daughter today. No CP or SOB. No LE edema, orthopnea or PND. No dizziness or syncope. No blood in stool or urine. No palpitations. She does feel more tired than expected and hopes that improves.   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 Ali Lowe and Dr. Cyndia Bent   Severe aortic stenosis    SVT (supraventricular tachycardia) (HCC)    TR (tricuspid regurgitation) 09/19/2016   moderate by echo 01/2020    Past Surgical History:  Procedure Laterality Date   CESAREAN SECTION  1964   [place]   removal extra fat inner thighs   EYE SURGERY Bilateral     Cataract extraction with IOL   INTRAOPERATIVE TRANSTHORACIC ECHOCARDIOGRAM N/A 03/16/2021   Procedure: INTRAOPERATIVE TRANSTHORACIC ECHOCARDIOGRAM;  Surgeon: Early Osmond, MD;  Location: [place];  Service: Open Heart Surgery;  Laterality: N/A;   RIGHT HEART CATH AND CORONARY ANGIOGRAPHY N/A 02/17/2021   Procedure: RIGHT HEART CATH AND CORONARY ANGIOGRAPHY;  Surgeon: Early Osmond, MD;  Location: [place] CV [facility];  Service: Cardiovascular;  Laterality: N/A;   TOTAL KNEE ARTHROPLASTY Right 11/30/2015   Procedure: RIGHT TOTAL KNEE ARTHROPLASTY;  Surgeon: Gaynelle Arabian, MD;  Location: [facility];  Service: Orthopedics;  Laterality: Right;   TOTAL KNEE ARTHROPLASTY Left 11/14/2016   Procedure: LEFT TOTAL KNEE ARTHROPLASTY;  Surgeon: Gaynelle Arabian, MD;  Location: [facility];  Service: Orthopedics;  Laterality: Left;   TRANSCATHETER AORTIC VALVE REPLACEMENT, TRANSFEMORAL Right 03/16/2021   Procedure: TRANSCATHETER AORTIC VALVE REPLACEMENT, TRANSFEMORAL;  Surgeon: Early Osmond, MD;  Location: [place];  Service: Open Heart Surgery;  Laterality: Right;    Current Medications: Current Meds  Medication Sig   acetaminophen (TYLENOL) 650 MG CR tablet Take 1,300 mg by mouth every 8 (eight) hours.   alendronate (FOSAMAX) 70 MG tablet Take 1 tablet by mouth every Thursday.   amoxicillin (AMOXIL) 500 MG tablet Take 4 tablets by mouth 1 hour prior to dental procedures and cleanings   aspirin EC 81 MG tablet Take 81 mg by mouth daily. Swallow whole.   atorvastatin (LIPITOR) 10 MG tablet Take 10  mg by mouth at bedtime.    buPROPion (WELLBUTRIN XL) 300 MG 24 hr tablet Take 300 mg by mouth daily after breakfast.   calcium carbonate (OS-CAL) 600 MG TABS tablet Take 600 mg by mouth 2 (two) times daily with a meal.   cyclobenzaprine (FLEXERIL) 10 MG tablet Take 10 mg by mouth at bedtime.   DULoxetine (CYMBALTA) 30 MG capsule Take 90 mg by mouth daily after breakfast.   gabapentin (NEURONTIN) 300 MG capsule Take 300 mg by mouth 2 (two) times daily.   Iron, Ferrous Sulfate, 325 (65 Fe) MG TABS Take 325 mg by mouth daily.   Lurasidone HCl 60 MG TABS Take 60 mg by mouth daily with supper. [name]   metFORMIN (GLUCOPHAGE) 500 MG tablet Take 500 mg by mouth 2 (two) times daily.   metoprolol succinate (TOPROL-XL) 25 MG 24 hr tablet TAKE 1 TABLET(25 MG) BY MOUTH IN THE MORNING AND AT BEDTIME   traMADol (ULTRAM) 50 MG tablet Take 50 mg by mouth at bedtime.   vitamin B-12 (CYANOCOBALAMIN) 1000 MCG tablet Take 1,000 mcg by mouth daily.     Allergies:   Sulfa antibiotics and Tizanidine hcl   Social History   Socioeconomic History   Marital status: Divorced    Spouse name: Not on file   Number of children: Not on file   Years of education: Not on file   Highest education level: Not on file  Occupational History   Not on file  Tobacco Use   Smoking status: Never   Smokeless tobacco: Never  Vaping Use   Vaping Use: Never used  Substance and Sexual Activity   Alcohol use: No   Drug use: No   Sexual activity: Not on file  Other Topics Concern   Not on file  Social History Narrative   Not on file   Social Determinants of Health   Financial Resource Strain: Not on file  Food Insecurity: Not on file  Transportation Needs: Not on file  Physical Activity: Not on file  Stress: Not on file  Social Connections: Not on file     Family History: The patient's family history includes Colon cancer in her mother; Heart attack in her brother; Hypertension in her brother. There is no history of  Stroke.  ROS:   Please see the history of present illness.    All other systems reviewed and are negative.  EKGs/Labs/Other Studies Reviewed:    The following studies were reviewed today:  TAVR OPERATIVE NOTE     Date of Procedure:                03/16/2021    Preoperative Diagnosis:      Severe Aortic Stenosis    Postoperative Diagnosis:  Same    Procedure:        Transcatheter Aortic Valve Replacement - Percutaneous Transfemoral Approach             Medtronic Evolut Pro-Plus (size 29 mm, serial # [id number])              Co-Surgeons:                        Gaye Pollack, MD and Lenna Sciara MD   Anesthesiologist:                  Adele Barthel, MD   Echocardiographer:              Jenkins Rouge, MD   Pre-operative Echo Findings:  Severe aortic stenosis  Left ventricular systolic function   Post-operative Echo Findings: Trace paravalvular leak Normal left ventricular systolic function   ____________    Echo 03/17/21:   1. Left ventricular ejection fraction, by estimation, is 55 to 60%. The  left ventricle has normal function. The left ventricle has no regional  wall motion abnormalities. Left ventricular diastolic parameters are  indeterminate.   2. Right ventricular systolic function is normal. The right ventricular  size is normal. There is mildly elevated pulmonary artery systolic  pressure.   3. Left atrial size was moderately dilated.   4. The mitral valve is degenerative. Mild mitral valve regurgitation. No  evidence of mitral stenosis. Moderate mitral annular calcification.   5. Well seated bioprosthetic valve. The aortic valve has been  repaired/replaced. Aortic valve regurgitation Mild aortic regurgitation  with trivial paravalvular leak at the 1oclock position. No aortic stenosis  is present. There is a CoreValve-Evolut Pro  prosthetic (TAVR) valve present in the aortic position. Procedure Date:  03/16/2021. Aortic valve mean gradient measures 9.0  mmHg. Aortic valve Vmax  measures 2.09 m/s.   6. The inferior vena cava is normal in size with greater than 50%  respiratory variability, suggesting right atrial pressure of 3 mmHg.    EKG:  EKG is ordered today.  The ekg ordered today demonstrates sinus HR 74  Recent Labs: 03/12/2021: ALT 9; B Natriuretic Peptide 155.7 03/17/2021: BUN 15; Creatinine, Ser 1.38; Hemoglobin 10.6; Platelets 219; Potassium 3.9; Sodium 135  Recent Lipid Panel No results found for: CHOL, TRIG, HDL, CHOLHDL, VLDL, LDLCALC, LDLDIRECT   Risk Assessment/Calculations:       Physical Exam:    VS:  BP 100/70 (BP Location: Left Arm, Patient Position: Sitting, Cuff Size: Normal)    Pulse 74    Ht 5\' 4"  (1.626 m)    Wt 182 lb (82.6 kg)    SpO2 95%    BMI 31.24 kg/m     Wt Readings from Last 3 Encounters:  03/24/21 182 lb (82.6 kg)  03/17/21 182 lb 15.7 oz (83 kg)  03/12/21 183 lb 11.2 oz (83.3 kg)     GEN:  Well nourished, well developed in no acute distress HEENT: Normal NECK: No JVD LYMPHATICS: No lymphadenopathy CARDIAC: RRR, no murmurs, rubs, gallops RESPIRATORY:  Clear to auscultation without rales, wheezing or rhonchi  ABDOMEN: Soft, non-tender, non-distended MUSCULOSKELETAL:  No edema; No deformity  SKIN: Warm and dry  Groin sites clear without hematoma or ecchymosis  NEUROLOGIC:  Alert and oriented x 3 PSYCHIATRIC:  Normal affect   ASSESSMENT:    1. S/P TAVR (transcatheter aortic valve replacement)   2. PAT (paroxysmal atrial tachycardia) (HCC)   3. Stage 3a chronic  kidney disease ([place])   4. Pulmonary nodule   5. Common bile duct dilatation   6. Lesion of pancreas    PLAN:    In order of problems listed above:  Severe AS s/p TAVR: doing well 1 week out from TAVR aside from some fatigue. Groin sites are healing well. ECG with NSR. SBE prophylaxis discussed; I have RX'd amoxicillin. I will see her back in March for 1 month follow up and echo. I have encouraged her to participate in cardiac  rehab, which she is cleared for.    Paroxysmal atrial tachycardia: continue BB   CKD stage III: creatinine, 1.38 at discharge with a baseline in the 1.1-1.2 range. Repeat BMET today   Pulmonary nodule: pre TAVR scans showed a small solid pulmonary nodule the left upper lobe measuring 4 mm. No follow-up needed if patient is low-risk. Non-contrast chest CT can be considered in 12 months if patient is high-risk. Pt is a lifetime non smoker and no follow up is recommended.   Dilated common bile duct: pre TAVR CT noted this which measures up to 1.1 cm, slightly greater than expected status post cholecystectomy. Recommend correlation with liver function tests. If LFTs are abnormal, consider MRCP. LFTs were normal on 03/12/21. No follow up needed.   Pancreatic lesion: pre TAVR CT showed a cystic lesion of the pancreatic head/uncinate process measuring up to 1.3 cm. Recommend follow up pre and post contrast MRI/MRCP or pancreatic protocol CT in 2 years. This will be deferred to her PCP. Will print out copy of report for her at next visit.      Medication Adjustments/Labs and Tests Ordered: Current medicines are reviewed at length with the patient today.  Concerns regarding medicines are outlined above.  Orders Placed This Encounter  Procedures   Basic metabolic panel   EKG [id number]   Meds ordered this encounter  Medications   amoxicillin (AMOXIL) 500 MG tablet    Sig: Take 4 tablets by mouth 1 hour prior to dental procedures and cleanings    Dispense:  12 tablet    Refill:  6    Patient Instructions  Medication Instructions:  Start Amoxicillin 500 mg, take 4 tablets by mouth 1 hour prior to dental procedures and cleanings   *If you need a refill on your cardiac medications before your next appointment, please call your pharmacy*   Lab Work: Bmp - today   If you have labs (blood work) drawn today and your tests are completely normal, you will receive your results only by: [place]  (if you have MyChart) OR A paper copy in the mail If you have any lab test that is abnormal or we need to change your treatment, we will call you to review the results.   Testing/Procedures: None ordered today    Follow-Up: Follow up as scheduled    Other Instructions None    Signed, Angelena Form, PA-C  03/24/2021 3:26 PM    [facility]

## 2021-03-25 LAB — BASIC METABOLIC PANEL
BUN/Creatinine Ratio: 20 (ref 12–28)
BUN: 23 mg/dL (ref 8–27)
CO2: 23 mmol/L (ref 20–29)
Calcium: 10 mg/dL (ref 8.7–10.3)
Chloride: 99 mmol/L (ref 96–106)
Creatinine, Ser: 1.16 mg/dL — ABNORMAL HIGH (ref 0.57–1.00)
Glucose: 80 mg/dL (ref 70–99)
Potassium: 4.8 mmol/L (ref 3.5–5.2)
Sodium: 139 mmol/L (ref 134–144)
eGFR: 49 mL/min/{1.73_m2} — ABNORMAL LOW (ref >59)

## 2021-04-05 DIAGNOSIS — K862 Cyst of pancreas: Secondary | ICD-10-CM | POA: Diagnosis not present

## 2021-04-05 DIAGNOSIS — Z953 Presence of xenogenic heart valve: Secondary | ICD-10-CM | POA: Diagnosis not present

## 2021-04-05 DIAGNOSIS — R052 Subacute cough: Secondary | ICD-10-CM | POA: Diagnosis not present

## 2021-04-13 NOTE — Progress Notes (Signed)
HEART AND [place]                                     Cardiology Office Note:    Date:  04/16/2021   ID:  Nicole Graves, DOB December 03, 1945, MRN [id number]  PCP:  Kathyrn Lass, MD  [facility] Cardiologist:  Fransico Him, MD/ Dr. Ali Lowe, MD & Dr. Cyndia Bent, MD (TAVR) [facility] Electrophysiologist:  None   Referring MD: Kathyrn Lass, MD   Chief Complaint  Patient presents with   Follow-up   History of Present Illness:    Nicole Graves is a 76 y.o. 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    She has a history of aortic stenosis that recently progressed to the severe range. She had complaints of SOB and dizziness with minimal exertion. Pre TAVR [facility] 02/17/21 showed no CAD with right dominance. She was then evaluated by the multidisciplinary valve team and underwent successful TAVR with a 29 mm Medtronic Evolut Pro-Plus via the TF approach on 03/16/21. Post op echocardiogram showed EF 55%, normally functioning TAVR with a mean gradient of 9 mmHg and trivial paravalvular leak at the 1oclock position. She was discharged on aspirin monotherapy.   She was seen back for TOC follow up with no specific complaints. She was doing well and felt surprising better than she anticipated.   She comes today alone. She states that she continues to have mild fatigue however has significant improvement in her SOB and dizziness. She has been able to do housework such as laundry without having to stop for breaks. I have encouraged her to participate in regular exercise and she suggests re-started Silver Sneakers which is a great idea. She denies chest pain, palpitations, LE edema, orthopnea, dizziness, or syncope.   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 Ali Lowe and Dr. Cyndia Bent   Severe aortic stenosis    SVT (supraventricular tachycardia) (HCC)    TR (tricuspid regurgitation) 09/19/2016   moderate by echo 01/2020    Past Surgical History:  Procedure Laterality Date   CESAREAN SECTION  1964   [place]   removal extra fat inner thighs   EYE SURGERY Bilateral     Cataract extraction with IOL   INTRAOPERATIVE TRANSTHORACIC ECHOCARDIOGRAM N/A 03/16/2021   Procedure: INTRAOPERATIVE TRANSTHORACIC ECHOCARDIOGRAM;  Surgeon: Early Osmond, MD;  Location: [place];  Service: Open Heart Surgery;  Laterality: N/A;   RIGHT HEART CATH AND CORONARY ANGIOGRAPHY N/A 02/17/2021   Procedure: RIGHT HEART CATH AND CORONARY ANGIOGRAPHY;  Surgeon: Early Osmond, MD;  Location: [place] [facility];  Service: Cardiovascular;  Laterality: N/A;   TOTAL KNEE ARTHROPLASTY Right 11/30/2015   Procedure: RIGHT TOTAL KNEE ARTHROPLASTY;  Surgeon: Gaynelle Arabian, MD;  Location: [facility];  Service: Orthopedics;  Laterality: Right;   TOTAL KNEE ARTHROPLASTY Left 11/14/2016   Procedure: LEFT TOTAL KNEE ARTHROPLASTY;  Surgeon: Gaynelle Arabian, MD;  Location: [facility];  Service: Orthopedics;  Laterality: Left;   TRANSCATHETER AORTIC VALVE REPLACEMENT, TRANSFEMORAL Right 03/16/2021   Procedure: TRANSCATHETER AORTIC VALVE REPLACEMENT, TRANSFEMORAL;  Surgeon: Early Osmond, MD;  Location: [place];  Service: Open Heart Surgery;  Laterality: Right;    Current Medications: Current Meds  Medication Sig   acetaminophen  (TYLENOL) 650 MG CR tablet Take 1,300 mg by mouth every 8 (eight) hours.   alendronate (FOSAMAX) 70 MG tablet Take 1 tablet by mouth every Thursday.   amoxicillin (AMOXIL) 500 MG tablet Take 4 tablets by mouth 1 hour prior to dental procedures and cleanings   aspirin EC 81 MG tablet Take 81 mg by mouth daily. Swallow whole.   atorvastatin (LIPITOR) 10 MG tablet Take 10 mg by mouth at bedtime.    buPROPion (WELLBUTRIN XL) 300 MG 24 hr tablet Take 300 mg by mouth daily after breakfast.   calcium carbonate (OS-CAL) 600 MG TABS tablet Take 600 mg by mouth 2 (two) times daily with a meal.   cyclobenzaprine (FLEXERIL) 10 MG tablet Take 10 mg by mouth at bedtime.   DULoxetine (CYMBALTA) 30 MG capsule Take 90 mg by mouth daily after breakfast.   gabapentin (NEURONTIN) 300 MG capsule Take 300 mg by mouth 2 (two) times daily.   Iron, Ferrous Sulfate, 325 (65 Fe) MG TABS Take 325 mg by mouth daily.   Lurasidone HCl 60 MG TABS Take 60 mg by mouth daily with supper. [name]   metFORMIN (GLUCOPHAGE) 500 MG tablet Take 500 mg by mouth 2 (two) times daily.   metoprolol succinate (TOPROL-XL) 25 MG 24 hr tablet TAKE 1 TABLET(25 MG) BY MOUTH IN THE MORNING AND AT BEDTIME   traMADol (ULTRAM) 50 MG tablet Take 50 mg by mouth at bedtime.   vitamin B-12 (CYANOCOBALAMIN) 1000 MCG tablet Take 1,000 mcg by mouth daily.     Allergies:   Sulfa antibiotics and Tizanidine hcl   Social History   Socioeconomic History   Marital status: Divorced    Spouse name: Not on file   Number of children: Not on file   Years of education: Not on file   Highest education level: Not on file  Occupational History   Not on file  Tobacco Use   Smoking status: Never   Smokeless tobacco: Never  Vaping Use   Vaping Use: Never used  Substance and Sexual Activity   Alcohol use: No   Drug use: No   Sexual activity: Not on file  Other Topics Concern   Not on file  Social History Narrative   Not on file   Social Determinants of  Health   Financial Resource Strain: Not on file  Food Insecurity: Not on file  Transportation Needs: Not on file  Physical Activity: Not on file  Stress: Not on file  Social Connections: Not on file     Family History: The patient's family history includes Colon cancer in her mother; Heart attack in her brother; Hypertension in her brother. There is no history of Stroke.  ROS:   Please see the history of present illness.    All other systems reviewed and are negative.  EKGs/Labs/Other Studies Reviewed:  The following studies were reviewed today:  TAVR OPERATIVE NOTE     Date of Procedure:                03/16/2021    Preoperative Diagnosis:      Severe Aortic Stenosis    Postoperative Diagnosis:    Same    Procedure:        Transcatheter Aortic Valve Replacement - Percutaneous Transfemoral Approach             Medtronic Evolut Pro-Plus (size 29 mm, serial # [id number])              Co-Surgeons:                        Gaye Pollack, MD and Lenna Sciara MD   Anesthesiologist:                  Adele Barthel, MD   Echocardiographer:              Jenkins Rouge, MD   Pre-operative Echo Findings:  Severe aortic stenosis  Left ventricular systolic function   Post-operative Echo Findings: Trace paravalvular leak Normal left ventricular systolic function  ____________   Echo 03/17/21:   1. Left ventricular ejection fraction, by estimation, is 55 to 60%. The  left ventricle has normal function. The left ventricle has no regional  wall motion abnormalities. Left ventricular diastolic parameters are  indeterminate.   2. Right ventricular systolic function is normal. The right ventricular  size is normal. There is mildly elevated pulmonary artery systolic  pressure.   3. Left atrial size was moderately dilated.   4. The mitral valve is degenerative. Mild mitral valve regurgitation. No  evidence of mitral stenosis. Moderate mitral annular calcification.   5. Well seated  bioprosthetic valve. The aortic valve has been  repaired/replaced. Aortic valve regurgitation Mild aortic regurgitation  with trivial paravalvular leak at the 1oclock position. No aortic stenosis  is present. There is a CoreValve-Evolut Pro  prosthetic (TAVR) valve present in the aortic position. Procedure Date:  03/16/2021. Aortic valve mean gradient measures 9.0 mmHg. Aortic valve Vmax  measures 2.09 m/s.   6. The inferior vena cava is normal in size with greater than 50%  respiratory variability, suggesting right atrial pressure of 3 mmHg.  EKG:  EKG is not ordered today.    Recent Labs: 03/12/2021: ALT 9; B Natriuretic Peptide 155.7 03/17/2021: Hemoglobin 10.6; Platelets 219 03/24/2021: BUN 23; Creatinine, Ser 1.16; Potassium 4.8; Sodium 139  Recent Lipid Panel No results found for: CHOL, TRIG, HDL, CHOLHDL, VLDL, LDLCALC, LDLDIRECT  Physical Exam:    VS:  BP 126/78    Pulse 62    Ht 5\' 4"  (1.626 m)    Wt 182 lb 9.6 oz (82.8 kg)    BMI 31.34 kg/m     Wt Readings from Last 3 Encounters:  04/16/21 182 lb 9.6 oz (82.8 kg)  03/24/21 182 lb (82.6 kg)  03/17/21 182 lb 15.7 oz (83 kg)    General: Well developed, well nourished, NAD Lungs:Clear to ausculation bilaterally. No wheezes, rales, or rhonchi. Breathing is unlabored. Cardiovascular: Irregularly irregular. Soft murmur Extremities: No edema.  Neuro: Alert and oriented. No focal deficits. No facial asymmetry. MAE spontaneously. Psych: Responds to questions appropriately with normal affect.    ASSESSMENT/PLAN:    Severe AS s/p TAVR: SOB and dizziness improved 1 month out from TAVR. Still has some fatigue however likely related to  deconditioning. NYHA II. Encouraged to increased regular exercise. Reiterated SBE with amoxicillin for dental procedures and cleanings.    Paroxysmal atrial tachycardia: Continue BB   CKD stage III: Creatinine, 1.38 at discharge>>repeat back to baseline at 1.16. Avoid nephrotoxic meds.    Pulmonary  nodule: pre TAVR scans showed a small solid pulmonary nodule the left upper lobe measuring 4 mm. No follow-up needed if patient is low-risk. Non-contrast chest CT can be considered in 12 months if patient is high-risk. Pt is a lifetime non smoker and no follow up is recommended.    Dilated common bile duct: pre TAVR CT noted this which measures up to 1.1 cm, slightly greater than expected status post cholecystectomy. Recommend correlation with liver function tests. If LFTs are abnormal, consider MRCP. LFTs were normal on 03/12/21. No follow up needed.    Pancreatic lesion: pre TAVR CT showed a cystic lesion of the pancreatic head/uncinate process measuring up to 1.3 cm. Recommend follow up pre and post contrast MRI/MRCP or pancreatic protocol CT in 2 years. This will be deferred to her PCP. Printed copy of report for her to take to PCP. Discussed today.    Medication Adjustments/Labs and Tests Ordered: Current medicines are reviewed at length with the patient today.  Concerns regarding medicines are outlined above.  Orders Placed This Encounter  Procedures   ECHOCARDIOGRAM COMPLETE   No orders of the defined types were placed in this encounter.   Patient Instructions  Medication Instructions:  Your physician recommends that you continue on your current medications as directed. Please refer to the Current Medication list given to you today.  *If you need a refill on your cardiac medications before your next appointment, please call your pharmacy*   Lab Work: None ordered  If you have labs (blood work) drawn today and your tests are completely normal, you will receive your results only by: [place] (if you have MyChart) OR A paper copy in the mail If you have any lab test that is abnormal or we need to change your treatment, we will call you to review the results.   Testing/Procedures: None ordered   Follow-Up: At [facility], you and your health needs are our priority.  As  part of our continuing mission to provide you with exceptional heart care, we have created designated Provider Care Teams.  These Care Teams include your primary Cardiologist (physician) and Advanced Practice Providers (APPs -  Physician Assistants and Nurse Practitioners) who all work together to provide you with the care you need, when you need it.  We recommend signing up for the patient portal called "MyChart".  Sign up information is provided on this After Visit Summary.  MyChart is used to connect with patients for Virtual Visits (Telemedicine).  Patients are able to view lab/test results, encounter notes, upcoming appointments, etc.  Non-urgent messages can be sent to your provider as well.   To learn more about what you can do with MyChart, go to [email or web address].    Your next appointment:   12 month(s)  The format for your next appointment:   In Person  Provider:   Kathyrn Drown, NP or Nell Range, PA-C    Other Instructions     Signed, Kathyrn Drown, NP  04/16/2021 11:00 AM    [place]

## 2021-04-16 ENCOUNTER — Other Ambulatory Visit: Payer: Self-pay

## 2021-04-16 ENCOUNTER — Ambulatory Visit (HOSPITAL_COMMUNITY): Payer: Medicare PPO | Attending: Cardiology

## 2021-04-16 ENCOUNTER — Ambulatory Visit: Payer: Medicare PPO | Admitting: Cardiology

## 2021-04-16 VITALS — BP 126/78 | HR 62 | Ht 64.0 in | Wt 182.6 lb

## 2021-04-16 DIAGNOSIS — Z952 Presence of prosthetic heart valve: Secondary | ICD-10-CM | POA: Diagnosis not present

## 2021-04-16 DIAGNOSIS — N1831 Chronic kidney disease, stage 3a: Secondary | ICD-10-CM | POA: Diagnosis not present

## 2021-04-16 DIAGNOSIS — I350 Nonrheumatic aortic (valve) stenosis: Secondary | ICD-10-CM

## 2021-04-16 DIAGNOSIS — K869 Disease of pancreas, unspecified: Secondary | ICD-10-CM | POA: Diagnosis not present

## 2021-04-16 DIAGNOSIS — R911 Solitary pulmonary nodule: Secondary | ICD-10-CM

## 2021-04-16 DIAGNOSIS — K838 Other specified diseases of biliary tract: Secondary | ICD-10-CM | POA: Diagnosis not present

## 2021-04-16 DIAGNOSIS — I471 Supraventricular tachycardia: Secondary | ICD-10-CM

## 2021-04-16 LAB — ECHOCARDIOGRAM COMPLETE
Area-P 1/2: 3.21 cm2
S' Lateral: 2.4 cm

## 2021-04-16 NOTE — Patient Instructions (Addendum)
Medication Instructions:  ?Your physician recommends that you continue on your current medications as directed. Please refer to the Current Medication list given to you today. ? ?*If you need a refill on your cardiac medications before your next appointment, please call your pharmacy* ? ? ?Lab Work: ?None ordered ? ?If you have labs (blood work) drawn today and your tests are completely normal, you will receive your results only by: ?MyChart Message (if you have MyChart) OR ?A paper copy in the mail ?If you have any lab test that is abnormal or we need to change your treatment, we will call you to review the results. ? ? ?Testing/Procedures: ?Your physician has requested that you have an echocardiogram. Echocardiography is a painless test that uses sound waves to create images of your heart. It provides your doctor with information about the size and shape of your heart and how well your heart?s chambers and valves are working. This procedure takes approximately one hour. There are no restrictions for this procedure. ? ? ? ?Follow-Up: ?At [facility], you and your health needs are our priority.  As part of our continuing mission to provide you with exceptional heart care, we have created designated Provider Care Teams.  These Care Teams include your primary Cardiologist (physician) and Advanced Practice Providers (APPs -  Physician Assistants and Nurse Practitioners) who all work together to provide you with the care you need, when you need it. ? ?We recommend signing up for the patient portal called "MyChart".  Sign up information is provided on this After Visit Summary.  MyChart is used to connect with patients for Virtual Visits (Telemedicine).  Patients are able to view lab/test results, encounter notes, upcoming appointments, etc.  Non-urgent messages can be sent to your provider as well.   ?To learn more about what you can do with MyChart, go to [email or web address].   ? ?Your next appointment:   ?12  month(s) ? ?The format for your next appointment:   ?In Person ? ?Provider:   ?Georgie Chard, NP or Carlean Jews, PA-C  ? ? ?Other Instructions ? ? ?

## 2021-05-04 ENCOUNTER — Other Ambulatory Visit: Payer: Self-pay | Admitting: Physician Assistant

## 2021-06-08 DIAGNOSIS — K862 Cyst of pancreas: Secondary | ICD-10-CM | POA: Diagnosis not present

## 2021-06-08 DIAGNOSIS — F325 Major depressive disorder, single episode, in full remission: Secondary | ICD-10-CM | POA: Diagnosis not present

## 2021-06-08 DIAGNOSIS — E7800 Pure hypercholesterolemia, unspecified: Secondary | ICD-10-CM | POA: Diagnosis not present

## 2021-06-08 DIAGNOSIS — E6609 Other obesity due to excess calories: Secondary | ICD-10-CM | POA: Diagnosis not present

## 2021-06-08 DIAGNOSIS — M4722 Other spondylosis with radiculopathy, cervical region: Secondary | ICD-10-CM | POA: Diagnosis not present

## 2021-06-08 DIAGNOSIS — M810 Age-related osteoporosis without current pathological fracture: Secondary | ICD-10-CM | POA: Diagnosis not present

## 2021-06-22 ENCOUNTER — Ambulatory Visit: Payer: Medicare PPO | Admitting: Cardiology

## 2021-06-22 ENCOUNTER — Encounter: Payer: Self-pay | Admitting: Cardiology

## 2021-06-22 ENCOUNTER — Other Ambulatory Visit: Payer: Self-pay | Admitting: Cardiology

## 2021-06-22 VITALS — BP 98/72 | HR 78 | Ht 64.0 in | Wt 181.8 lb

## 2021-06-22 DIAGNOSIS — I471 Supraventricular tachycardia: Secondary | ICD-10-CM

## 2021-06-22 DIAGNOSIS — I951 Orthostatic hypotension: Secondary | ICD-10-CM

## 2021-06-22 DIAGNOSIS — I350 Nonrheumatic aortic (valve) stenosis: Secondary | ICD-10-CM | POA: Diagnosis not present

## 2021-06-22 DIAGNOSIS — R42 Dizziness and giddiness: Secondary | ICD-10-CM

## 2021-06-22 MED ORDER — METOPROLOL SUCCINATE ER 25 MG PO TB24: 25.0000 mg | ORAL_TABLET | Freq: Every morning | ORAL | 3 refills | Status: DC

## 2021-06-22 NOTE — Progress Notes (Signed)
? ?Date:  06/22/2021  ? ?ID:  Nicole Graves, DOB 09/14/1945, MRN [id number] ? ? ?PCP:  Kathyrn Lass, MD  ?Cardiologist: Fransico Him, MD ?Electrophysiologist:  None  ? ?Chief Complaint:  Aortic stenosis ? ?History of Present Illness:   ? ?Nicole Graves is a 76 y.o. female with a hx of AS, AI and SVT.  2D echo 12/03/2020 showed normal LV function with moderate LVH, mild MR and moderate to severe aortic valve stenosis with mean aortic valve gradient 25 mmHg, DI 0.25 and stroke-volume index reduced to 28 consistent with low-flow low gradient moderate to severe aortic stenosis in the setting of normal LV function.  She ultimately underwent TAVR on 03/16/2021 with a 29 mm Medtronic Evolute Pro-Plus via the TF approach.  Postop echo showed normal LV function with EF 55% and a mean TAVR gradient of 9 mmHg with trivial paravalvular leak at 1 o'clock position. ? ?She is here today for followup and is doing well.  She denies any chest pain or pressure, SOB, DOE, PND, orthopnea, LE edema, palpitations or syncope. She has been having some problems with dizziness at times. She is compliant with her meds and is tolerating meds with no SE.    ? ?Prior CV studies:   ?The following studies were reviewed today: ? ?2D ECHO ? ?Past Medical History:  ?Diagnosis Date  ? Anemia   ? Arthritis   ? Knees, neck, back  ? Chronic lower back pain   ? with siatica  ? Colon polyp 1999  ? Depression   ? GERD (gastroesophageal reflux disease)   ? Mild aortic insufficiency   ? Mild mitral regurgitation   ? Orthostatic hypotension   ? PAT (paroxysmal atrial tachycardia) ([place])   ? PONV (postoperative nausea and vomiting)   ? 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 s/p TAVR with a 29 mm Medtronic Evolut FX via the TF approach by Dr. Ali Lowe and Dr. Cyndia Bent  ? Severe aortic stenosis   ? SVT (supraventricular tachycardia) ([place])   ? TR (tricuspid regurgitation) 09/19/2016  ? moderate by echo 01/2020  ? ?Past Surgical History:  ?Procedure  Laterality Date  ? [place]  ? X2  ? CHOLECYSTECTOMY    ? [place]  ? removal extra fat inner thighs  ? EYE SURGERY Bilateral   ?  Cataract extraction with IOL  ? INTRAOPERATIVE TRANSTHORACIC ECHOCARDIOGRAM N/A 03/16/2021  ? Procedure: INTRAOPERATIVE TRANSTHORACIC ECHOCARDIOGRAM;  Surgeon: Early Osmond, MD;  Location: [place];  Service: Open Heart Surgery;  Laterality: N/A;  ? RIGHT HEART CATH AND CORONARY ANGIOGRAPHY N/A 02/17/2021  ? Procedure: RIGHT HEART CATH AND CORONARY ANGIOGRAPHY;  Surgeon: Early Osmond, MD;  Location: [place] [facility];  Service: Cardiovascular;  Laterality: N/A;  ? TOTAL KNEE ARTHROPLASTY Right 11/30/2015  ? Procedure: RIGHT TOTAL KNEE ARTHROPLASTY;  Surgeon: Gaynelle Arabian, MD;  [facility];  Service: Orthopedics;  Laterality: Right;  ? TOTAL KNEE ARTHROPLASTY Left 11/14/2016  ? Procedure: LEFT TOTAL KNEE ARTHROPLASTY;  Surgeon: Gaynelle Arabian, MD;  [facility];  Service: Orthopedics;  Laterality: Left;  ? TRANSCATHETER AORTIC VALVE REPLACEMENT, TRANSFEMORAL Right 03/16/2021  ? Procedure: TRANSCATHETER AORTIC VALVE REPLACEMENT, TRANSFEMORAL;  Surgeon: Early Osmond, MD;  Location: [place];  Service: Open Heart Surgery;  Laterality: Right;  ?  ? ?Current Meds  ?Medication Sig  ? acetaminophen (TYLENOL) 650 MG CR tablet Take 1,300 mg by mouth every 8 (eight) hours.  ?  alendronate (FOSAMAX) 70 MG tablet Take 1 tablet by mouth every Thursday.  ? amoxicillin (AMOXIL) 500 MG tablet Take 4 tablets by mouth 1 hour prior to dental procedures and cleanings  ? aspirin EC 81 MG tablet Take 81 mg by mouth daily. Swallow whole.  ? atorvastatin (LIPITOR) 10 MG tablet Take 10 mg by mouth at bedtime.   ? buPROPion (WELLBUTRIN XL) 300 MG 24 hr tablet Take 300 mg by mouth daily after breakfast.  ? calcium carbonate (OS-CAL) 600 MG TABS tablet Take 600 mg by mouth 2 (two) times daily with a meal.  ? cyclobenzaprine (FLEXERIL) 10 MG tablet Take 10 mg by mouth at  bedtime.  ? DULoxetine (CYMBALTA) 30 MG capsule Take 90 mg by mouth daily after breakfast.  ? gabapentin (NEURONTIN) 300 MG capsule Take 300 mg by mouth 2 (two) times daily.  ? Iron, Ferrous Sulfate, 325 (65 Fe) MG TABS Take 325 mg by mouth daily.  ? Lurasidone HCl 60 MG TABS Take 60 mg by mouth daily with supper. [name]  ? metFORMIN (GLUCOPHAGE) 500 MG tablet Take 500 mg by mouth 2 (two) times daily.  ? traMADol (ULTRAM) 50 MG tablet Take 50 mg by mouth at bedtime.  ? vitamin B-12 (CYANOCOBALAMIN) 1000 MCG tablet Take 1,000 mcg by mouth daily.  ? [DISCONTINUED] metoprolol succinate (TOPROL-XL) 25 MG 24 hr tablet TAKE 1 TABLET(25 MG) BY MOUTH IN THE MORNING AND AT BEDTIME  ?  ? ?Allergies:   Sulfa antibiotics and Tizanidine hcl  ? ?Social History  ? ?Tobacco Use  ? Smoking status: Never  ? Smokeless tobacco: Never  ?Vaping Use  ? Vaping Use: Never used  ?Substance Use Topics  ? Alcohol use: No  ? Drug use: No  ?  ? ?Family Hx: ?The patient's family history includes Colon cancer in her mother; Heart attack in her brother; Hypertension in her brother. There is no history of Stroke. ? ?ROS:   ?Please see the history of present illness.    ? ?All other systems reviewed and are negative. ? ? ?Labs/Other Tests and Data Reviewed:   ? ?Recent Labs: ?03/12/2021: ALT 9; B Natriuretic Peptide 155.7 ?03/17/2021: Hemoglobin 10.6; Platelets 219 ?03/24/2021: BUN 23; Creatinine, Ser 1.16; Potassium 4.8; Sodium 139  ? ?Recent Lipid Panel ?No results found for: CHOL, TRIG, HDL, CHOLHDL, LDLCALC, LDLDIRECT ? ?Wt Readings from Last 3 Encounters:  ?06/22/21 181 lb 12.8 oz (82.5 kg)  ?04/16/21 182 lb 9.6 oz (82.8 kg)  ?03/24/21 182 lb (82.6 kg)  ?  ? ?Objective:   ? ?Vital Signs:  BP 98/72   Pulse 78   Ht 5\' 4"  (1.626 m)   Wt 181 lb 12.8 oz (82.5 kg)   SpO2 97%   BMI 31.21 kg/m?   ? ?GEN: Well nourished, well developed in no acute distress ?HEENT: Normal ?NECK: No JVD; No carotid bruits ?LYMPHATICS: No lymphadenopathy ?CARDIAC:RRR, no  rubs, gallops.  1/6 SM at RUSB ?RESPIRATORY:  Clear to auscultation without rales, wheezing or rhonchi  ?ABDOMEN: Soft, non-tender, non-distended ?MUSCULOSKELETAL:  No edema; No deformity  ?SKIN: Warm and dry ?NEUROLOGIC:  Alert and oriented x 3 ?PSYCHIATRIC:  Normal affect  PSYCHIATRIC:  Normal affect   ?ASSESSMENT & PLAN:   ? ?1.  Paroxysmal atrial tachycardia ?-She continues to maintain normal sinus rhythm and denies any significant palpitations except for a few skipped beats tingling a few seconds at a time ?-Decrease Toprol XL to 25mg  daily due to soft BP and dizziness ?-check 48 hour  BP monitor ? ?2.  Aortic stenosis ?-2D echo 09/2017 showed normal LVF with mild AS and mean AVG 2mmHg.   ?-2D echo 12/03/2020 showed normal LV function with moderate LVH, mild MR and moderate to severe aortic valve stenosis with mean aortic valve gradient 25 mmHg, DI 0.25 and stroke-volume index reduced to 28 consistent with low-flow low gradient moderate to severe aortic stenosis in the setting of normal LV function ?-Status post TAVR on 03/16/2021 with a 29 mm Medtronic Evolute Pro-Plus via the TF approach.   ?-Postop echo showed normal LV function with EF 55% and a mean TAVR gradient of 9 mmHg with trivial paravalvular leak at 1 o'clock position. ? ?3.  Orthostatic hypotension ?-She did denies any syncope but has been having some issues with dizziness ?-BP is low today ?-decrease Toprol Xl to 25mg  daily ? ? ? ?Medication Adjustments/Labs and Tests Ordered: ?Current medicines are reviewed at length with the patient today.  Concerns regarding medicines are outlined above.  ?Tests Ordered: ?Orders Placed This Encounter  ?Procedures  ? CAR 24HR BLOOD PRESSURE MONITOR  ? ? ?Medication Changes: ?No orders of the defined types were placed in this encounter. ? ? ? ?Disposition:  Follow up in 6 months ? ?Signed, ?Fransico Him, MD  ?06/22/2021 10:38 AM    ?[place] ?

## 2021-06-22 NOTE — Patient Instructions (Signed)
Medication Instructions:  ?Your physician has recommended you make the following change in your medication:  ?1) DECREASE Toprol XL (metoprolol succinate) to 25 mg every morning  ?*If you need a refill on your cardiac medications before your next appointment, please call your pharmacy* ? ?Testing/Procedures: ?Your provider would like for you to wear a 24 hour blood pressure cuff. ? ?Follow-Up: ?At [facility], you and your health needs are our priority.  As part of our continuing mission to provide you with exceptional heart care, we have created designated Provider Care Teams.  These Care Teams include your primary Cardiologist (physician) and Advanced Practice Providers (APPs -  Physician Assistants and Nurse Practitioners) who all work together to provide you with the care you need, when you need it. ? ?Your next appointment:   ?6 year(s) ? ?The format for your next appointment:   ?In Person ? ?Provider:   ?[name], MD ? ?If primary card or EP is not listed click here to update    :1}  ? ? ?Important Information About Sugar ? ? ? ? ?  ?

## 2021-06-23 ENCOUNTER — Telehealth: Payer: Self-pay | Admitting: *Deleted

## 2021-06-23 NOTE — Telephone Encounter (Signed)
LMVM- calling to schedule 24 Hour Ambulatory Blood pressure monitor requested by Dr. [name].  Please call Chimene Salo in monitors at [phone]. ?

## 2021-06-24 ENCOUNTER — Telehealth: Payer: Self-pay | Admitting: *Deleted

## 2021-06-24 ENCOUNTER — Other Ambulatory Visit: Payer: Self-pay | Admitting: *Deleted

## 2021-06-24 NOTE — Telephone Encounter (Signed)
Patient returned call for her 24hr blood pressure monitor. ?

## 2021-06-24 NOTE — Telephone Encounter (Signed)
24 hour ambulatory blood pressure monitor scheduled for Tuesday, 06/29/21, 9:00 AM. ?

## 2021-06-25 DIAGNOSIS — M542 Cervicalgia: Secondary | ICD-10-CM | POA: Diagnosis not present

## 2021-06-29 ENCOUNTER — Ambulatory Visit (INDEPENDENT_AMBULATORY_CARE_PROVIDER_SITE_OTHER): Payer: Medicare PPO

## 2021-06-29 DIAGNOSIS — I471 Supraventricular tachycardia: Secondary | ICD-10-CM

## 2021-06-29 DIAGNOSIS — I951 Orthostatic hypotension: Secondary | ICD-10-CM

## 2021-06-29 DIAGNOSIS — R42 Dizziness and giddiness: Secondary | ICD-10-CM | POA: Diagnosis not present

## 2021-06-29 DIAGNOSIS — I350 Nonrheumatic aortic (valve) stenosis: Secondary | ICD-10-CM | POA: Diagnosis not present

## 2021-06-29 DIAGNOSIS — M19012 Primary osteoarthritis, left shoulder: Secondary | ICD-10-CM | POA: Diagnosis not present

## 2021-06-29 DIAGNOSIS — M25512 Pain in left shoulder: Secondary | ICD-10-CM | POA: Diagnosis not present

## 2021-06-29 NOTE — Progress Notes (Unsigned)
24 Hour ambulatory blood pressure monitor applied to patients right arm using standard adult cuff. ?Note: patient is right handed but states she has pins and needle pain in left arm from pinched nerve. ?

## 2021-07-06 ENCOUNTER — Telehealth: Payer: Self-pay | Admitting: Cardiology

## 2021-07-06 NOTE — Telephone Encounter (Signed)
Patient dont have access to mychart calling to get results. Please advise

## 2021-07-06 NOTE — Telephone Encounter (Signed)
The patient has been notified of the result and verbalized understanding.  All questions (if any) were answered. Ethelda Chick, RN 07/06/2021 10:54 AM

## 2021-07-20 DIAGNOSIS — Z1231 Encounter for screening mammogram for malignant neoplasm of breast: Secondary | ICD-10-CM | POA: Diagnosis not present

## 2021-07-21 DIAGNOSIS — M25512 Pain in left shoulder: Secondary | ICD-10-CM | POA: Diagnosis not present

## 2021-08-05 ENCOUNTER — Telehealth: Payer: Self-pay | Admitting: Cardiology

## 2021-08-05 MED ORDER — AMOXICILLIN 500 MG PO TABS: ORAL_TABLET | ORAL | 6 refills | Status: DC

## 2021-08-05 NOTE — Telephone Encounter (Signed)
Patient states she is having a dental cleaning done on July 11. She states she was told she would have to take antibiotics prior to her dental cleaning. She is requesting a prescription for them.

## 2021-08-05 NOTE — Telephone Encounter (Signed)
Spoke with the patient and advised that amoxicillin has been sent into her pharmacy. Advised her to take 4 tablets 1 hour prior to dental procedure. Patient verbalized understanding.

## 2021-08-06 ENCOUNTER — Telehealth: Payer: Medicare PPO | Admitting: Physician Assistant

## 2021-08-06 DIAGNOSIS — U071 COVID-19: Secondary | ICD-10-CM | POA: Diagnosis not present

## 2021-08-06 MED ORDER — BENZONATATE 100 MG PO CAPS: 100.0000 mg | ORAL_CAPSULE | Freq: Three times a day (TID) | ORAL | 0 refills | Status: DC | PRN

## 2021-08-06 MED ORDER — MOLNUPIRAVIR EUA 200MG CAPSULE: 4.0000 | ORAL_CAPSULE | Freq: Two times a day (BID) | ORAL | 0 refills | Status: AC

## 2021-08-26 DIAGNOSIS — M19012 Primary osteoarthritis, left shoulder: Secondary | ICD-10-CM | POA: Diagnosis not present

## 2021-08-26 DIAGNOSIS — M25512 Pain in left shoulder: Secondary | ICD-10-CM | POA: Diagnosis not present

## 2021-09-22 ENCOUNTER — Other Ambulatory Visit: Payer: Self-pay

## 2021-09-22 ENCOUNTER — Emergency Department (HOSPITAL_COMMUNITY): Payer: Medicare PPO

## 2021-09-22 ENCOUNTER — Inpatient Hospital Stay (HOSPITAL_COMMUNITY)
Admission: EM | Admit: 2021-09-22 | Discharge: 2021-09-27 | DRG: 071 | Disposition: A | Discharge status: Home Health | Payer: Medicare PPO | Attending: Internal Medicine | Admitting: Internal Medicine

## 2021-09-22 DIAGNOSIS — D638 Anemia in other chronic diseases classified elsewhere: Secondary | ICD-10-CM | POA: Diagnosis not present

## 2021-09-22 DIAGNOSIS — I639 Cerebral infarction, unspecified: Secondary | ICD-10-CM | POA: Diagnosis not present

## 2021-09-22 DIAGNOSIS — N1832 Chronic kidney disease, stage 3b: Secondary | ICD-10-CM | POA: Diagnosis present

## 2021-09-22 DIAGNOSIS — F03918 Unspecified dementia, unspecified severity, with other behavioral disturbance: Secondary | ICD-10-CM | POA: Diagnosis not present

## 2021-09-22 DIAGNOSIS — Z953 Presence of xenogenic heart valve: Secondary | ICD-10-CM

## 2021-09-22 DIAGNOSIS — G9341 Metabolic encephalopathy: Secondary | ICD-10-CM | POA: Diagnosis not present

## 2021-09-22 DIAGNOSIS — F0392 Unspecified dementia, unspecified severity, with psychotic disturbance: Secondary | ICD-10-CM | POA: Diagnosis not present

## 2021-09-22 DIAGNOSIS — E11649 Type 2 diabetes mellitus with hypoglycemia without coma: Secondary | ICD-10-CM | POA: Diagnosis not present

## 2021-09-22 DIAGNOSIS — Z86010 Personal history of colonic polyps: Secondary | ICD-10-CM

## 2021-09-22 DIAGNOSIS — K449 Diaphragmatic hernia without obstruction or gangrene: Secondary | ICD-10-CM | POA: Diagnosis not present

## 2021-09-22 DIAGNOSIS — E876 Hypokalemia: Secondary | ICD-10-CM | POA: Diagnosis present

## 2021-09-22 DIAGNOSIS — R410 Disorientation, unspecified: Secondary | ICD-10-CM | POA: Diagnosis not present

## 2021-09-22 DIAGNOSIS — E1169 Type 2 diabetes mellitus with other specified complication: Secondary | ICD-10-CM | POA: Diagnosis not present

## 2021-09-22 DIAGNOSIS — Z8616 Personal history of COVID-19: Secondary | ICD-10-CM

## 2021-09-22 DIAGNOSIS — Z6831 Body mass index (BMI) 31.0-31.9, adult: Secondary | ICD-10-CM | POA: Diagnosis not present

## 2021-09-22 DIAGNOSIS — G2401 Drug induced subacute dyskinesia: Secondary | ICD-10-CM | POA: Diagnosis present

## 2021-09-22 DIAGNOSIS — M545 Low back pain, unspecified: Secondary | ICD-10-CM | POA: Diagnosis present

## 2021-09-22 DIAGNOSIS — Z800 Family history of malignant neoplasm of digestive organs: Secondary | ICD-10-CM

## 2021-09-22 DIAGNOSIS — R42 Dizziness and giddiness: Secondary | ICD-10-CM | POA: Diagnosis not present

## 2021-09-22 DIAGNOSIS — E782 Mixed hyperlipidemia: Secondary | ICD-10-CM | POA: Diagnosis not present

## 2021-09-22 DIAGNOSIS — R296 Repeated falls: Secondary | ICD-10-CM | POA: Diagnosis present

## 2021-09-22 DIAGNOSIS — I6389 Other cerebral infarction: Secondary | ICD-10-CM | POA: Diagnosis not present

## 2021-09-22 DIAGNOSIS — G319 Degenerative disease of nervous system, unspecified: Secondary | ICD-10-CM | POA: Diagnosis not present

## 2021-09-22 DIAGNOSIS — Q283 Other malformations of cerebral vessels: Secondary | ICD-10-CM | POA: Diagnosis not present

## 2021-09-22 DIAGNOSIS — F329 Major depressive disorder, single episode, unspecified: Secondary | ICD-10-CM | POA: Diagnosis present

## 2021-09-22 DIAGNOSIS — E669 Obesity, unspecified: Secondary | ICD-10-CM | POA: Diagnosis present

## 2021-09-22 DIAGNOSIS — F0393 Unspecified dementia, unspecified severity, with mood disturbance: Secondary | ICD-10-CM | POA: Diagnosis present

## 2021-09-22 DIAGNOSIS — F0394 Unspecified dementia, unspecified severity, with anxiety: Secondary | ICD-10-CM | POA: Diagnosis present

## 2021-09-22 DIAGNOSIS — Z96653 Presence of artificial knee joint, bilateral: Secondary | ICD-10-CM | POA: Diagnosis present

## 2021-09-22 DIAGNOSIS — G8929 Other chronic pain: Secondary | ICD-10-CM | POA: Diagnosis present

## 2021-09-22 DIAGNOSIS — R5381 Other malaise: Secondary | ICD-10-CM | POA: Diagnosis present

## 2021-09-22 DIAGNOSIS — I471 Supraventricular tachycardia: Secondary | ICD-10-CM | POA: Diagnosis not present

## 2021-09-22 DIAGNOSIS — K219 Gastro-esophageal reflux disease without esophagitis: Secondary | ICD-10-CM | POA: Diagnosis present

## 2021-09-22 DIAGNOSIS — E1122 Type 2 diabetes mellitus with diabetic chronic kidney disease: Secondary | ICD-10-CM | POA: Diagnosis not present

## 2021-09-22 DIAGNOSIS — Z8249 Family history of ischemic heart disease and other diseases of the circulatory system: Secondary | ICD-10-CM | POA: Diagnosis not present

## 2021-09-22 DIAGNOSIS — R4182 Altered mental status, unspecified: Secondary | ICD-10-CM | POA: Diagnosis not present

## 2021-09-22 DIAGNOSIS — I63233 Cerebral infarction due to unspecified occlusion or stenosis of bilateral carotid arteries: Secondary | ICD-10-CM | POA: Diagnosis not present

## 2021-09-22 DIAGNOSIS — R442 Other hallucinations: Secondary | ICD-10-CM | POA: Diagnosis not present

## 2021-09-22 DIAGNOSIS — M47812 Spondylosis without myelopathy or radiculopathy, cervical region: Secondary | ICD-10-CM | POA: Diagnosis not present

## 2021-09-22 HISTORY — DX: Anxiety disorder, unspecified: F41.9

## 2021-09-22 LAB — CBC WITH DIFFERENTIAL/PLATELET
Abs Immature Granulocytes: 0.01 10*3/uL (ref 0.00–0.07)
Basophils Absolute: 0 10*3/uL (ref 0.0–0.1)
Basophils Relative: 1 %
Eosinophils Absolute: 0.1 10*3/uL (ref 0.0–0.5)
Eosinophils Relative: 2 %
HCT: 33.9 % — ABNORMAL LOW (ref 36.0–46.0)
Hemoglobin: 10.9 g/dL — ABNORMAL LOW (ref 12.0–15.0)
Immature Granulocytes: 0 %
Lymphocytes Relative: 32 %
Lymphs Abs: 1.9 10*3/uL (ref 0.7–4.0)
MCH: 30.9 pg (ref 26.0–34.0)
MCHC: 32.2 g/dL (ref 30.0–36.0)
MCV: 96 fL (ref 80.0–100.0)
Monocytes Absolute: 0.5 10*3/uL (ref 0.1–1.0)
Monocytes Relative: 8 %
Neutro Abs: 3.4 10*3/uL (ref 1.7–7.7)
Neutrophils Relative %: 57 %
Platelets: 238 10*3/uL (ref 150–400)
RBC: 3.53 MIL/uL — ABNORMAL LOW (ref 3.87–5.11)
RDW: 13.5 % (ref 11.5–15.5)
WBC: 5.9 10*3/uL (ref 4.0–10.5)
nRBC: 0 % (ref 0.0–0.2)

## 2021-09-22 LAB — COMPREHENSIVE METABOLIC PANEL
ALT: 9 U/L (ref 0–44)
AST: 14 U/L — ABNORMAL LOW (ref 15–41)
Albumin: 3.6 g/dL (ref 3.5–5.0)
Alkaline Phosphatase: 48 U/L (ref 38–126)
Anion gap: 8 (ref 5–15)
BUN: 28 mg/dL — ABNORMAL HIGH (ref 8–23)
CO2: 26 mmol/L (ref 22–32)
Calcium: 10.3 mg/dL (ref 8.9–10.3)
Chloride: 105 mmol/L (ref 98–111)
Creatinine, Ser: 1.35 mg/dL — ABNORMAL HIGH (ref 0.44–1.00)
GFR, Estimated: 40 mL/min — ABNORMAL LOW (ref >60)
Glucose, Bld: 89 mg/dL (ref 70–99)
Potassium: 3.5 mmol/L (ref 3.5–5.1)
Sodium: 139 mmol/L (ref 135–145)
Total Bilirubin: 0.5 mg/dL (ref 0.3–1.2)
Total Protein: 6.4 g/dL — ABNORMAL LOW (ref 6.5–8.1)

## 2021-09-22 LAB — URINALYSIS, ROUTINE W REFLEX MICROSCOPIC
Bilirubin Urine: NEGATIVE
Glucose, UA: NEGATIVE mg/dL
Hgb urine dipstick: NEGATIVE
Ketones, ur: NEGATIVE mg/dL
Leukocytes,Ua: NEGATIVE
Nitrite: NEGATIVE
Protein, ur: NEGATIVE mg/dL
Specific Gravity, Urine: 1.008 (ref 1.005–1.030)
pH: 5 (ref 5.0–8.0)

## 2021-09-22 LAB — CBG MONITORING, ED
Glucose-Capillary: 68 mg/dL — ABNORMAL LOW (ref 70–99)
Glucose-Capillary: 59 mg/dL — ABNORMAL LOW (ref 70–99)
Glucose-Capillary: 83 mg/dL (ref 70–99)

## 2021-09-22 MED ORDER — HALOPERIDOL LACTATE 5 MG/ML IJ SOLN
2.0000 mg | Freq: Once | INTRAMUSCULAR | Status: AC
  Administered 2021-09-22: 2 mg via INTRAVENOUS
  Filled 2021-09-22: qty 1

## 2021-09-22 MED ORDER — ZIPRASIDONE MESYLATE 20 MG IM SOLR
10.0000 mg | Freq: Once | INTRAMUSCULAR | Status: AC
  Administered 2021-09-22: 10 mg via INTRAMUSCULAR
  Filled 2021-09-22: qty 20

## 2021-09-22 MED ORDER — LORAZEPAM 2 MG/ML IJ SOLN
1.0000 mg | Freq: Once | INTRAMUSCULAR | Status: AC
  Administered 2021-09-22: 1 mg via INTRAVENOUS
  Filled 2021-09-22: qty 1

## 2021-09-22 MED ORDER — DIPHENHYDRAMINE HCL 50 MG/ML IJ SOLN
12.5000 mg | Freq: Once | INTRAMUSCULAR | Status: AC
  Administered 2021-09-22: 12.5 mg via INTRAVENOUS
  Filled 2021-09-22: qty 1

## 2021-09-22 MED ORDER — SODIUM CHLORIDE 0.9 % IV BOLUS
1000.0000 mL | Freq: Once | INTRAVENOUS | Status: AC
  Administered 2021-09-22: 1000 mL via INTRAVENOUS

## 2021-09-22 NOTE — ED Notes (Signed)
Pt is at MRI will get vitals when she returns.

## 2021-09-22 NOTE — ED Notes (Signed)
Pt given orange juice.

## 2021-09-22 NOTE — ED Triage Notes (Signed)
Ems brings pt in from home for an evaluation. Family reports pt has had a decreased appetite, frequent falls, decreased interest in leaving the house. Family also reports hallucinations recently that are new for pt.
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
## 2021-09-23 ENCOUNTER — Encounter (HOSPITAL_COMMUNITY): Payer: Self-pay | Admitting: Internal Medicine

## 2021-09-23 ENCOUNTER — Inpatient Hospital Stay (HOSPITAL_COMMUNITY): Payer: Medicare PPO

## 2021-09-23 ENCOUNTER — Other Ambulatory Visit (HOSPITAL_COMMUNITY): Payer: Medicare PPO

## 2021-09-23 DIAGNOSIS — F329 Major depressive disorder, single episode, unspecified: Secondary | ICD-10-CM | POA: Diagnosis present

## 2021-09-23 DIAGNOSIS — D638 Anemia in other chronic diseases classified elsewhere: Secondary | ICD-10-CM

## 2021-09-23 DIAGNOSIS — R4182 Altered mental status, unspecified: Secondary | ICD-10-CM

## 2021-09-23 DIAGNOSIS — F03918 Unspecified dementia, unspecified severity, with other behavioral disturbance: Secondary | ICD-10-CM | POA: Diagnosis not present

## 2021-09-23 DIAGNOSIS — G9341 Metabolic encephalopathy: Secondary | ICD-10-CM | POA: Diagnosis not present

## 2021-09-23 DIAGNOSIS — I639 Cerebral infarction, unspecified: Secondary | ICD-10-CM

## 2021-09-23 DIAGNOSIS — N1832 Chronic kidney disease, stage 3b: Secondary | ICD-10-CM

## 2021-09-23 DIAGNOSIS — E1169 Type 2 diabetes mellitus with other specified complication: Secondary | ICD-10-CM | POA: Diagnosis present

## 2021-09-23 DIAGNOSIS — E782 Mixed hyperlipidemia: Secondary | ICD-10-CM

## 2021-09-23 DIAGNOSIS — E1122 Type 2 diabetes mellitus with diabetic chronic kidney disease: Secondary | ICD-10-CM

## 2021-09-23 DIAGNOSIS — I471 Supraventricular tachycardia: Secondary | ICD-10-CM

## 2021-09-23 LAB — CBC WITH DIFFERENTIAL/PLATELET
Abs Immature Granulocytes: 0.01 10*3/uL (ref 0.00–0.07)
Basophils Absolute: 0 10*3/uL (ref 0.0–0.1)
Basophils Relative: 0 %
Eosinophils Absolute: 0.1 10*3/uL (ref 0.0–0.5)
Eosinophils Relative: 2 %
HCT: 29.8 % — ABNORMAL LOW (ref 36.0–46.0)
Hemoglobin: 9.7 g/dL — ABNORMAL LOW (ref 12.0–15.0)
Immature Granulocytes: 0 %
Lymphocytes Relative: 40 %
Lymphs Abs: 2.1 10*3/uL (ref 0.7–4.0)
MCH: 30.7 pg (ref 26.0–34.0)
MCHC: 32.6 g/dL (ref 30.0–36.0)
MCV: 94.3 fL (ref 80.0–100.0)
Monocytes Absolute: 0.4 10*3/uL (ref 0.1–1.0)
Monocytes Relative: 8 %
Neutro Abs: 2.6 10*3/uL (ref 1.7–7.7)
Neutrophils Relative %: 50 %
Platelets: 203 10*3/uL (ref 150–400)
RBC: 3.16 MIL/uL — ABNORMAL LOW (ref 3.87–5.11)
RDW: 13.5 % (ref 11.5–15.5)
WBC: 5.3 10*3/uL (ref 4.0–10.5)
nRBC: 0 % (ref 0.0–0.2)

## 2021-09-23 LAB — CBG MONITORING, ED: Glucose-Capillary: 95 mg/dL (ref 70–99)

## 2021-09-23 LAB — IRON AND TIBC
Iron: 52 ug/dL (ref 28–170)
Saturation Ratios: 18 % (ref 10.4–31.8)
TIBC: 294 ug/dL (ref 250–450)
UIBC: 242 ug/dL

## 2021-09-23 LAB — COMPREHENSIVE METABOLIC PANEL
ALT: 8 U/L (ref 0–44)
AST: 12 U/L — ABNORMAL LOW (ref 15–41)
Albumin: 2.9 g/dL — ABNORMAL LOW (ref 3.5–5.0)
Alkaline Phosphatase: 40 U/L (ref 38–126)
Anion gap: 6 (ref 5–15)
BUN: 19 mg/dL (ref 8–23)
CO2: 25 mmol/L (ref 22–32)
Calcium: 9.1 mg/dL (ref 8.9–10.3)
Chloride: 108 mmol/L (ref 98–111)
Creatinine, Ser: 1.18 mg/dL — ABNORMAL HIGH (ref 0.44–1.00)
GFR, Estimated: 47 mL/min — ABNORMAL LOW (ref >60)
Glucose, Bld: 90 mg/dL (ref 70–99)
Potassium: 3.4 mmol/L — ABNORMAL LOW (ref 3.5–5.1)
Sodium: 139 mmol/L (ref 135–145)
Total Bilirubin: 0.5 mg/dL (ref 0.3–1.2)
Total Protein: 5.3 g/dL — ABNORMAL LOW (ref 6.5–8.1)

## 2021-09-23 LAB — LIPID PANEL
Cholesterol: 136 mg/dL (ref 0–200)
HDL: 59 mg/dL (ref >40)
LDL Cholesterol: 64 mg/dL (ref 0–99)
Total CHOL/HDL Ratio: 2.3 RATIO
Triglycerides: 64 mg/dL (ref <150)
VLDL: 13 mg/dL (ref 0–40)

## 2021-09-23 LAB — MAGNESIUM: Magnesium: 1.3 mg/dL — ABNORMAL LOW (ref 1.7–2.4)

## 2021-09-23 LAB — GLUCOSE, CAPILLARY
Glucose-Capillary: 132 mg/dL — ABNORMAL HIGH (ref 70–99)
Glucose-Capillary: 135 mg/dL — ABNORMAL HIGH (ref 70–99)
Glucose-Capillary: 136 mg/dL — ABNORMAL HIGH (ref 70–99)

## 2021-09-23 LAB — TSH
TSH: 2.338 u[IU]/mL (ref 0.350–4.500)
TSH: 2.183 u[IU]/mL (ref 0.350–4.500)

## 2021-09-23 LAB — HEMOGLOBIN A1C
Hgb A1c MFr Bld: 4.9 % (ref 4.8–5.6)
Mean Plasma Glucose: 93.93 mg/dL

## 2021-09-23 LAB — VITAMIN B12: Vitamin B-12: 400 pg/mL (ref 180–914)

## 2021-09-23 LAB — FOLATE: Folate: 10.3 ng/mL (ref >5.9)

## 2021-09-23 LAB — AMMONIA: Ammonia: 14 umol/L (ref 9–35)

## 2021-09-23 MED ORDER — BUPROPION HCL ER (XL) 150 MG PO TB24
300.0000 mg | ORAL_TABLET | Freq: Every day | ORAL | Status: DC
  Administered 2021-09-23 – 2021-09-27 (×5): 300 mg via ORAL
  Filled 2021-09-23 (×5): qty 2

## 2021-09-23 MED ORDER — INSULIN ASPART 100 UNIT/ML IJ SOLN
0.0000 [IU] | Freq: Three times a day (TID) | INTRAMUSCULAR | Status: DC
  Administered 2021-09-23: 2 [IU] via SUBCUTANEOUS
  Filled 2021-09-23: qty 0.15

## 2021-09-23 MED ORDER — ASPIRIN 325 MG PO TABS
325.0000 mg | ORAL_TABLET | Freq: Once | ORAL | Status: AC
  Administered 2021-09-23: 325 mg via ORAL
  Filled 2021-09-23: qty 1

## 2021-09-23 MED ORDER — ACETAMINOPHEN 650 MG RE SUPP: 650.0000 mg | Freq: Four times a day (QID) | RECTAL | Status: DC | PRN

## 2021-09-23 MED ORDER — ENOXAPARIN SODIUM 40 MG/0.4ML IJ SOSY
40.0000 mg | PREFILLED_SYRINGE | INTRAMUSCULAR | Status: DC
  Administered 2021-09-23 – 2021-09-26 (×4): 40 mg via SUBCUTANEOUS
  Filled 2021-09-23 (×5): qty 0.4

## 2021-09-23 MED ORDER — IOHEXOL 350 MG/ML SOLN
65.0000 mL | Freq: Once | INTRAVENOUS | Status: AC | PRN
  Administered 2021-09-23: 65 mL via INTRAVENOUS

## 2021-09-23 MED ORDER — LURASIDONE HCL 20 MG PO TABS
60.0000 mg | ORAL_TABLET | Freq: Every day | ORAL | Status: DC
Start: 2021-09-23 — End: 2021-09-25
  Administered 2021-09-23 – 2021-09-24 (×2): 60 mg via ORAL
  Filled 2021-09-23 (×4): qty 3

## 2021-09-23 MED ORDER — ASPIRIN 81 MG PO TBEC
81.0000 mg | DELAYED_RELEASE_TABLET | Freq: Every day | ORAL | Status: DC
  Administered 2021-09-24 – 2021-09-27 (×4): 81 mg via ORAL
  Filled 2021-09-23 (×5): qty 1

## 2021-09-23 MED ORDER — GABAPENTIN 300 MG PO CAPS
300.0000 mg | ORAL_CAPSULE | Freq: Three times a day (TID) | ORAL | Status: DC
  Administered 2021-09-23 – 2021-09-27 (×11): 300 mg via ORAL
  Filled 2021-09-23 (×11): qty 1

## 2021-09-23 MED ORDER — ATORVASTATIN CALCIUM 10 MG PO TABS
10.0000 mg | ORAL_TABLET | Freq: Every day | ORAL | Status: DC
  Administered 2021-09-23 – 2021-09-26 (×5): 10 mg via ORAL
  Filled 2021-09-23 (×5): qty 1

## 2021-09-23 MED ORDER — POTASSIUM CHLORIDE 20 MEQ PO PACK
20.0000 meq | PACK | Freq: Once | ORAL | Status: AC
  Administered 2021-09-23: 20 meq via ORAL
  Filled 2021-09-23: qty 1

## 2021-09-23 MED ORDER — SODIUM CHLORIDE 0.9% FLUSH
3.0000 mL | Freq: Two times a day (BID) | INTRAVENOUS | Status: DC
  Administered 2021-09-23 – 2021-09-27 (×8): 3 mL via INTRAVENOUS

## 2021-09-23 MED ORDER — ONDANSETRON HCL 4 MG/2ML IJ SOLN: 4.0000 mg | Freq: Four times a day (QID) | INTRAMUSCULAR | Status: DC | PRN

## 2021-09-23 MED ORDER — METOPROLOL SUCCINATE ER 25 MG PO TB24
25.0000 mg | ORAL_TABLET | Freq: Every day | ORAL | Status: DC
  Administered 2021-09-23 – 2021-09-27 (×5): 25 mg via ORAL
  Filled 2021-09-23 (×5): qty 1

## 2021-09-23 MED ORDER — DULOXETINE HCL 60 MG PO CPEP
90.0000 mg | ORAL_CAPSULE | Freq: Every day | ORAL | Status: DC
  Administered 2021-09-23 – 2021-09-27 (×5): 90 mg via ORAL
  Filled 2021-09-23 (×5): qty 1

## 2021-09-23 MED ORDER — SODIUM CHLORIDE 0.9 % IV SOLN: INTRAVENOUS | Status: DC

## 2021-09-23 MED ORDER — MAGNESIUM SULFATE 2 GM/50ML IV SOLN
2.0000 g | Freq: Once | INTRAVENOUS | Status: AC
  Administered 2021-09-23: 2 g via INTRAVENOUS
  Filled 2021-09-23: qty 50

## 2021-09-23 MED ORDER — SODIUM CHLORIDE (PF) 0.9 % IJ SOLN
INTRAMUSCULAR | Status: AC
  Filled 2021-09-23: qty 50

## 2021-09-23 MED ORDER — DEXTROSE IN LACTATED RINGERS 5 % IV SOLN: INTRAVENOUS | Status: DC

## 2021-09-23 MED ORDER — CLOPIDOGREL BISULFATE 75 MG PO TABS
75.0000 mg | ORAL_TABLET | Freq: Every day | ORAL | Status: DC
  Administered 2021-09-24 – 2021-09-27 (×4): 75 mg via ORAL
  Filled 2021-09-23 (×6): qty 1

## 2021-09-23 MED ORDER — LIDOCAINE 5 % EX PTCH
1.0000 | MEDICATED_PATCH | CUTANEOUS | Status: DC
Start: 2021-09-23 — End: 2021-09-27
  Administered 2021-09-23 – 2021-09-26 (×4): 1 via TRANSDERMAL
  Filled 2021-09-23 (×4): qty 1

## 2021-09-23 MED ORDER — ORAL CARE MOUTH RINSE: 15.0000 mL | OROMUCOSAL | Status: DC | PRN

## 2021-09-23 MED ORDER — ONDANSETRON HCL 4 MG PO TABS: 4.0000 mg | ORAL_TABLET | Freq: Four times a day (QID) | ORAL | Status: DC | PRN

## 2021-09-23 MED ORDER — CLOPIDOGREL BISULFATE 300 MG PO TABS
300.0000 mg | ORAL_TABLET | Freq: Once | ORAL | Status: AC
  Administered 2021-09-23: 300 mg via ORAL
  Filled 2021-09-23: qty 1

## 2021-09-23 MED ORDER — POLYETHYLENE GLYCOL 3350 17 G PO PACK
17.0000 g | PACK | Freq: Every day | ORAL | Status: DC | PRN
  Administered 2021-09-26: 17 g via ORAL
  Filled 2021-09-23: qty 1

## 2021-09-23 MED ORDER — POTASSIUM CHLORIDE 10 MEQ/100ML IV SOLN
10.0000 meq | INTRAVENOUS | Status: AC
  Administered 2021-09-23 (×2): 10 meq via INTRAVENOUS
  Filled 2021-09-23 (×2): qty 100

## 2021-09-23 MED ORDER — STROKE: EARLY STAGES OF RECOVERY BOOK
Freq: Once | Status: AC
  Filled 2021-09-23: qty 1

## 2021-09-23 MED ORDER — ACETAMINOPHEN 325 MG PO TABS: 650.0000 mg | ORAL_TABLET | Freq: Four times a day (QID) | ORAL | Status: DC | PRN

## 2021-09-23 NOTE — Assessment & Plan Note (Signed)
   Notable mild  anemia  No clinical evidence of bleeding  We will obtain iron panel, folate, vitamin B12  Monitoring hemoglobin and hematocrit with serial CBCs

## 2021-09-23 NOTE — ED Notes (Signed)
ED TO INPATIENT HANDOFF REPORT  ED Nurse Name and Phone #: Luz Brazen Name/Age/Gender Nicole Graves 76 y.o. female Room/Bed: WA18/WA18  Code Status   Code Status: Full Code  Home/SNF/Other Home Patient oriented to: self  Is this baseline? Yes   Triage Complete: Triage complete  Chief Complaint Acute stroke due to ischemia [facility]) [I63.9]  Triage Note Ems brings pt in from home for an evaluation. Family reports pt has had a decreased appetite, frequent falls, decreased interest in leaving the house. Family also reports hallucinations recently that are new for pt.   Allergies Allergies  Allergen Reactions   Sulfa Antibiotics Nausea And Vomiting and Other (See Comments)   Tizanidine Hcl Other (See Comments)    Pt does not remember     Level of Care/Admitting Diagnosis ED Disposition     ED Disposition  Admit   Condition  --   [place]: [place] [[id number]]  Level of Care: Telemetry Medical [104]  May admit patient to [facility] or [facility] if equivalent level of care is available:: No  Covid Evaluation: Asymptomatic - no recent exposure (last 10 days) testing not required  Diagnosis: Acute stroke due to ischemia [facility]) [id number]  Admitting Physician: Vernelle Emerald [id number]  Attending Physician: Vernelle Emerald [id number]  Certification:: I certify this patient will need inpatient services for at least 2 midnights  Estimated Length of Stay: 3          B Medical/Surgery History Past Medical History:  Diagnosis Date   Anemia    Arthritis    Knees, neck, back   Chronic lower back pain    with siatica   Colon polyp 1999   Depression    GERD (gastroesophageal reflux disease)    Mild aortic insufficiency    Mild mitral regurgitation    Orthostatic hypotension    PAT (paroxysmal atrial tachycardia) (HCC)    PONV (postoperative nausea and vomiting)    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 Ali Lowe and Dr. Cyndia Bent   Severe aortic stenosis    SVT (supraventricular tachycardia) ([place])    TR (tricuspid regurgitation) 09/19/2016   moderate by echo 01/2020   Past Surgical History:  Procedure Laterality Date   CESAREAN SECTION  1964   [place]   removal extra fat inner thighs   EYE SURGERY Bilateral     Cataract extraction with IOL   INTRAOPERATIVE TRANSTHORACIC ECHOCARDIOGRAM N/A 03/16/2021   Procedure: INTRAOPERATIVE TRANSTHORACIC ECHOCARDIOGRAM;  Surgeon: Early Osmond, MD;  Location: [place];  Service: Open Heart Surgery;  Laterality: N/A;   RIGHT HEART CATH AND CORONARY ANGIOGRAPHY N/A 02/17/2021   Procedure: RIGHT HEART CATH AND CORONARY ANGIOGRAPHY;  Surgeon: Early Osmond, MD;  Location: [place] [facility];  Service: Cardiovascular;  Laterality: N/A;   TOTAL KNEE ARTHROPLASTY Right 11/30/2015   Procedure: RIGHT TOTAL KNEE ARTHROPLASTY;  Surgeon: Gaynelle Arabian, MD;  Location: [facility];  Service: Orthopedics;  Laterality: Right;   TOTAL KNEE ARTHROPLASTY Left 11/14/2016   Procedure: LEFT TOTAL KNEE ARTHROPLASTY;  Surgeon: Gaynelle Arabian, MD;  Location: [facility];  Service: Orthopedics;  Laterality: Left;   TRANSCATHETER AORTIC VALVE REPLACEMENT, TRANSFEMORAL Right 03/16/2021   Procedure: TRANSCATHETER AORTIC VALVE REPLACEMENT, TRANSFEMORAL;  Surgeon: Early Osmond, MD;  Location: [place];  Service:  Open Heart Surgery;  Laterality: Right;     A IV Location/Drains/Wounds Patient Lines/Drains/Airways Status     Active Line/Drains/Airways     Name Placement date Placement time Site Days   Peripheral IV 09/23/21 20 G Posterior;Right Forearm 09/23/21  0258  Forearm  less than 1   Airway 03/16/21  0800  -- 191   Incision (Closed) 11/14/16 Knee Left 11/14/16  0917  -- 1774   Incision (Closed) 03/16/21 Groin Right 03/16/21  0811  -- 191            Intake/Output Last  24 hours No intake or output data in the 24 hours ending 09/23/21 0347  Labs/Imaging Results for orders placed or performed during the hospital encounter of 09/22/21 (from the past 48 hour(s))  CBC with Differential     Status: Abnormal   Collection Time: 09/22/21  3:09 PM  Result Value Ref Range   WBC 5.9 4.0 - 10.5 K/uL   RBC 3.53 (L) 3.87 - 5.11 MIL/uL   Hemoglobin 10.9 (L) 12.0 - 15.0 g/dL   HCT 38.7 (L) 56.4 - 33.2 %   MCV 96.0 80.0 - 100.0 fL   MCH 30.9 26.0 - 34.0 pg   MCHC 32.2 30.0 - 36.0 g/dL   RDW 95.1 88.4 - 16.6 %   Platelets 238 150 - 400 K/uL   nRBC 0.0 0.0 - 0.2 %   Neutrophils Relative % 57 %   Neutro Abs 3.4 1.7 - 7.7 K/uL   Lymphocytes Relative 32 %   Lymphs Abs 1.9 0.7 - 4.0 K/uL   Monocytes Relative 8 %   Monocytes Absolute 0.5 0.1 - 1.0 K/uL   Eosinophils Relative 2 %   Eosinophils Absolute 0.1 0.0 - 0.5 K/uL   Basophils Relative 1 %   Basophils Absolute 0.0 0.0 - 0.1 K/uL   Immature Granulocytes 0 %   Abs Immature Granulocytes 0.01 0.00 - 0.07 K/uL    Comment: Performed at [facility], [address], [place], [place] [address]  Comprehensive metabolic panel     Status: Abnormal   Collection Time: 09/22/21  3:09 PM  Result Value Ref Range   Sodium 139 135 - 145 mmol/L   Potassium 3.5 3.5 - 5.1 mmol/L   Chloride 105 98 - 111 mmol/L   CO2 26 22 - 32 mmol/L   Glucose, Bld 89 70 - 99 mg/dL    Comment: Glucose reference range applies only to samples taken after fasting for at least 8 hours.   BUN 28 (H) 8 - 23 mg/dL   Creatinine, Ser 6.01 (H) 0.44 - 1.00 mg/dL   Calcium 09.3 8.9 - 23.5 mg/dL   Total Protein 6.4 (L) 6.5 - 8.1 g/dL   Albumin 3.6 3.5 - 5.0 g/dL   AST 14 (L) 15 - 41 U/L   ALT 9 0 - 44 U/L   Alkaline Phosphatase 48 38 - 126 U/L   Total Bilirubin 0.5 0.3 - 1.2 mg/dL   GFR, Estimated 40 (L) >60 mL/min    Comment: (NOTE) Calculated using the CKD-EPI Creatinine Equation (2021)    Anion gap 8 5 - 15    Comment:  Performed at [facility], [address] [place], [place] [address]  CBG monitoring, ED     Status: Abnormal   Collection Time: 09/22/21  4:10 PM  Result Value Ref Range   Glucose-Capillary 68 (L) 70 - 99 mg/dL    Comment: Glucose reference range applies only to  samples taken after fasting for at least 8 hours.  Urinalysis, Routine w reflex microscopic Urine, Clean Catch     Status: None   Collection Time: 09/22/21  5:16 PM  Result Value Ref Range   Color, Urine YELLOW YELLOW   APPearance CLEAR CLEAR   Specific Gravity, Urine 1.008 1.005 - 1.030   pH 5.0 5.0 - 8.0   Glucose, UA NEGATIVE NEGATIVE mg/dL   Hgb urine dipstick NEGATIVE NEGATIVE   Bilirubin Urine NEGATIVE NEGATIVE   Ketones, ur NEGATIVE NEGATIVE mg/dL   Protein, ur NEGATIVE NEGATIVE mg/dL   Nitrite NEGATIVE NEGATIVE   Leukocytes,Ua NEGATIVE NEGATIVE    Comment: Performed at [facility], [address] [place], [place] [address]  CBG monitoring, ED     Status: Abnormal   Collection Time: 09/22/21  6:13 PM  Result Value Ref Range   Glucose-Capillary 59 (L) 70 - 99 mg/dL    Comment: Glucose reference range applies only to samples taken after fasting for at least 8 hours.  CBG monitoring, ED     Status: None   Collection Time: 09/22/21  6:33 PM  Result Value Ref Range   Glucose-Capillary 83 70 - 99 mg/dL    Comment: Glucose reference range applies only to samples taken after fasting for at least 8 hours.   MR BRAIN WO CONTRAST  Result Date: 09/22/2021 CLINICAL DATA:  Initial evaluation for neuro deficit, stroke suspected. EXAM: MRI HEAD WITHOUT CONTRAST TECHNIQUE: Multiplanar, multiecho pulse sequences of the brain and surrounding structures were obtained without intravenous contrast. COMPARISON:  Prior CT from earlier the same day. FINDINGS: Brain: Examination degraded by motion artifact. Mild age-related cerebral atrophy. Patchy T2/FLAIR hyperintensity involving the periventricular  deep white matter both cerebral hemispheres, consistent with chronic small vessel ischemic disease, mild for age. Punctate 4 mm focus of diffusion signal abnormality involving the deep white matter of the right frontal centrum semi ovale, consistent with a small acute to early subacute ischemic infarct (series 12, image 41). No other convincing evidence for acute or subacute ischemia. Gray-white matter differentiation otherwise maintained. No visible areas of chronic cortical infarction. No visible acute or chronic intracranial blood products. No mass lesion, midline shift or mass effect. No hydrocephalus or extra-axial fluid collection. Pituitary gland and suprasellar region within normal limits. Vascular: Major intracranial vascular flow voids are maintained. Skull and upper cervical spine: Craniocervical junction within normal limits. Bone marrow signal intensity grossly normal. No scalp soft tissue abnormality. Sinuses/Orbits: Prior bilateral ocular lens replacement. Paranasal sinuses are largely clear. No significant mastoid effusion. Other: None. IMPRESSION: 1. 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 No associated hemorrhage. 2. No other acute intracranial abnormality. 3. 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 Electronically Signed   By: Rise Mu M.D.   On: 09/22/2021 23:02   CT Head Wo Contrast  Result Date: 09/22/2021 CLINICAL DATA:  Mental status change unknown cause. EXAM: CT HEAD WITHOUT CONTRAST TECHNIQUE: Contiguous axial images were obtained from the base of the skull through the vertex without intravenous contrast. RADIATION DOSE REDUCTION: This exam was performed according to the departmental dose-optimization program which includes automated exposure control, adjustment of the mA and/or kV according to patient size  and/or use of iterative reconstruction technique. COMPARISON:  None Available. FINDINGS: Brain: Mild atrophy. Negative for hydrocephalus. Negative for acute infarct, hemorrhage, mass. Mild white  matter hypodensity consistent with small vessel ischemia Vascular: Negative for hyperdense vessel Skull: Negative Sinuses/Orbits: Paranasal sinuses clear. Bilateral cataract extraction Other: None. IMPRESSION: Mild atrophy and mild chronic microvascular ischemia. No acute intracranial abnormality. Electronically Signed   By: Marlan Palau M.D.   On: 09/22/2021 16:18   DG Chest 2 View  Result Date: 09/22/2021 CLINICAL DATA:  Altered mental status EXAM: CHEST - 2 VIEW COMPARISON:  Chest x-ray dated August 10, 2021 FINDINGS: Cardiac and mediastinal contours are within normal limits. Status post transcatheter aortic valve replacement. Large hiatal hernia. Lungs are clear. No large pleural effusion or pneumothorax. 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 IMPRESSION: 1. Clear lungs. 2. Large hiatal hernia. Electronically Signed   By: Allegra Lai M.D.   On: 09/22/2021 15:46    Pending Labs Unresulted Labs (From admission, onward)     Start     Ordered   09/23/21 0500  Lipid panel  (Labs)  Tomorrow morning,   R       Comments: Fasting    09/23/21 0040   09/23/21 0500  Hemoglobin A1c  (Labs)  Tomorrow morning,   R       Comments: To assess prior glycemic control    09/23/21 0040   09/23/21 0500  Comprehensive metabolic panel  Tomorrow morning,   R        09/23/21 0040   09/23/21 0500  Magnesium  Tomorrow morning,   R        09/23/21 0040   09/23/21 0500  CBC with Differential/Platelet  Tomorrow morning,   R        09/23/21 0040   09/23/21 0500  Folate  Tomorrow morning,   R        09/23/21 0057   09/23/21 0500  Iron and TIBC  Tomorrow morning,   R        09/23/21 0057   09/23/21 0500  Vitamin B12  Tomorrow morning,   R        09/23/21 0057            Vitals/Pain Today's  Vitals   09/22/21 2230 09/22/21 2300 09/23/21 0145 09/23/21 0328  BP: (!) 149/71 130/76 (!) 140/84   Pulse: 81 84 81   Resp:  18 16   Temp:    (!) 97.5 F (36.4 C)  TempSrc:    Oral  SpO2: 100% 99% 92%   Weight:      Height:        Isolation Precautions No active isolations  Medications Medications  aspirin EC tablet 81 mg (has no administration in time range)  atorvastatin (LIPITOR) tablet 10 mg (10 mg Oral Given 09/23/21 0140)  metoprolol succinate (TOPROL-XL) 24 hr tablet 25 mg (has no administration in time range)  DULoxetine (CYMBALTA) DR capsule 90 mg (has no administration in time range)  buPROPion (WELLBUTRIN XL) 24 hr tablet 300 mg (has no administration in time range)  insulin aspart (novoLOG) injection 0-15 Units (has no administration in time range)   stroke: early stages of recovery book (has no administration in time range)  enoxaparin (LOVENOX) injection 40 mg (has no administration in time range)  sodium chloride flush (NS) 0.9 % injection 3 mL (3 mLs Intravenous Given 09/23/21 0335)  acetaminophen (TYLENOL) tablet 650 mg (has no administration in time range)    Or  acetaminophen (TYLENOL) suppository 650 mg (has no administration in time range)  polyethylene glycol (MIRALAX / GLYCOLAX) packet 17 g (has no administration  in time range)  ondansetron (ZOFRAN) tablet 4 mg (has no administration in time range)    Or  ondansetron (ZOFRAN) injection 4 mg (has no administration in time range)  0.9 %  sodium chloride infusion ( Intravenous New Bag/Given 09/23/21 0141)  clopidogrel (PLAVIX) tablet 300 mg (300 mg Oral Given 09/23/21 0140)    Followed by  clopidogrel (PLAVIX) tablet 75 mg (has no administration in time range)  sodium chloride 0.9 % bolus 1,000 mL (0 mLs Intravenous Stopped 09/22/21 1823)  LORazepam (ATIVAN) injection 1 mg (1 mg Intravenous Given 09/22/21 1822)  haloperidol lactate (HALDOL) injection 2 mg (2 mg Intravenous Given 09/22/21 1859)  diphenhydrAMINE  (BENADRYL) injection 12.5 mg (12.5 mg Intravenous Given 09/22/21 1859)  ziprasidone (GEODON) injection 10 mg (10 mg Intramuscular Given 09/22/21 2007)  aspirin tablet 325 mg (325 mg Oral Given 09/23/21 0141)  sodium chloride (PF) 0.9 % injection (  Given by Other 09/23/21 0335)  iohexol (OMNIPAQUE) 350 MG/ML injection 65 mL (65 mLs Intravenous Contrast Given 09/23/21 0212)    Mobility walks with device Low fall risk   Focused Assessments    R Recommendations: See Admitting Provider Note  Report given to:   Additional Notes:

## 2021-09-23 NOTE — Assessment & Plan Note (Addendum)
   Patient may have been predisposed to acute stroke due to recent COVID-19 infection.  Performing serial neurologic checks  Monitoring patient on telemetry  Initiating antiplatelet therapy including aspirin 325 mg followed by 81 mg daily and clopidogrel 300 mg followed by 75 mg daily  Daily statin therapy will be titrated if LDL is greater than 70  Further imaging to include: CT angiogram of the head and neck  Obtaining hemoglobin A1c and lipid panel in the morning  Echocardiogram in the morning with bubble study  PT, OT, SLP evaluation  Permissive hypertension with as needed antihypertensives only to be given if blood pressure greater than 220/115  Neurology planning to follow along consultation and will need to be notified upon arrival to [facility], their assistance is appreciated.

## 2021-09-23 NOTE — Progress Notes (Addendum)
PROGRESS NOTE    Nicole Graves  [id number] DOB: [birth year] DOA: 09/22/2021 PCP: Kathyrn Lass, MD   Brief Narrative: 76 year old with past medical history significant for severe aortic stenosis status post TAVR 02/2021, CKD 3B, non-insulin-dependent diabetes, paroxysmal atrial tachycardia, major depressive disorder, presents for evaluation of hallucination and confusion.  Per family since patient was diagnosed with COVID-19 in late June she has exhibited a gradual decline.  Progressive worsening weakness and decreased appetite.  Over the last several days she has exhibited episode of anxiety with hallucination. Evaluation in the ED MRI was obtained revealing right frontal centrum semiovale acute to subacute infarct.  Patient has been admitted for further evaluation.  Of note per Dr. Mendel Ryder, MRI finding are less likely consistent with stroke.  Patient has a bed assign  to [facility] and she will be transferred.    Assessment & Plan:   Principal Problem:   Acute stroke due to ischemia [facility]) Active Problems:   Acute metabolic encephalopathy   Chronic kidney disease, stage 3b (HCC)   Type 2 diabetes mellitus with stage 3b chronic kidney disease, without long-term current use of insulin (HCC)   Paroxysmal atrial tachycardia (HCC)   Anemia of chronic disease   Mixed hyperlipidemia due to type 2 diabetes mellitus (HCC)   Major depressive disorder   1-Hallucination, confusion;  Acute metabolic encephalopathy  -MRI : 4 mm focus of diffusion signal abnormality involving the deep white matter of the right frontal centrum semi ovale, likely a small acute to early subacute ischemic infarct. No associated hemorrhage. -She received geodon in the Ed.  -Dr Mendel Ryder will see patient in consultation. He doesn't think patient symptoms are related to MRI finding of stroke.  -Will check ammonia level, TSH, RPR. B 12: 400. -She was on aspirin . She was started on Plavix . Will defer to neurology  discontinuation of plavix.   2-CKD stage IIIb:  Monitor.   DM type 2 with Hypoglycemia.  SSI.  IV fluids with D 5. Cbg was low at 59 last night/  Check CBG Q 4 hours.   PAT; Continue with metoprolol/   Anemia of chronic diseases. Monitor hb.  B 12 normal.   Hyperlipidemia;  On lipitor.   Major depressive disorder:  On Cymbalta.   Hypomagnesemia; replete IV>  Hypokalemia; replete orally.   Estimated body mass index is 31.41 kg/m as calculated from the following:   Height as of this encounter: 5\' 4"  (1.626 m).   Weight as of this encounter: 83 kg.   DVT prophylaxis: Lovenox Code Status: Full code Family Communication: Disposition Plan:  Status is: Inpatient Remains inpatient appropriate because: evaluation of encephalopathy     Consultants:  Neurology   Procedures:    Antimicrobials:    Subjective: She was alert, calm. She denies pain. She was able to moves all 4 extremities   Objective: Vitals:   09/23/21 0500 09/23/21 0655 09/23/21 0736 09/23/21 0826  BP: 132/67 (!) 155/82 (!) 148/137 128/70  Pulse:  88 93 92  Resp: 18 (!) 21 13 18   Temp:   97.7 F (36.5 C) 98.2 F (36.8 C)  TempSrc:    Oral  SpO2:  97% 97% 98%  Weight:      Height:       No intake or output data in the 24 hours ending 09/23/21 1127 Filed Weights   09/22/21 1823  Weight: 83 kg    Examination:  General exam: Appears calm and comfortable  Respiratory system: Clear to  auscultation. Respiratory effort normal. Cardiovascular system: S1 & S2 heard, RRR. N Gastrointestinal system: Abdomen is nondistended, soft and nontender. No organomegaly or masses felt. Normal bowel sounds heard. Central nervous system: Alert follows command Extremities: Symmetric 5 x 5 power.    Data Reviewed: I have personally reviewed following labs and imaging studies  CBC: Recent Labs  Lab 09/22/21 1509 09/23/21 0455  WBC 5.9 5.3  NEUTROABS 3.4 2.6  HGB 10.9* 9.7*  HCT 33.9* 29.8*  MCV 96.0  94.3  PLT 238 203   Basic Metabolic Panel: Recent Labs  Lab 09/22/21 1509 09/23/21 0455  NA 139 139  K 3.5 3.4*  CL 105 108  CO2 26 25  GLUCOSE 89 90  BUN 28* 19  CREATININE 1.35* 1.18*  CALCIUM 10.3 9.1  MG  --  1.3*   GFR: Estimated Creatinine Clearance: 40.9 mL/min (A) (by C-G formula based on SCr of 1.18 mg/dL (H)). Liver Function Tests: Recent Labs  Lab 09/22/21 1509 09/23/21 0455  AST 14* 12*  ALT 9 8  ALKPHOS 48 40  BILITOT 0.5 0.5  PROT 6.4* 5.3*  ALBUMIN 3.6 2.9*   No results for input(s): "LIPASE", "AMYLASE" in the last 168 hours. Recent Labs  Lab 09/23/21 0854  AMMONIA 14   Coagulation Profile: No results for input(s): "INR", "PROTIME" in the last 168 hours. Cardiac Enzymes: No results for input(s): "CKTOTAL", "CKMB", "CKMBINDEX", "TROPONINI" in the last 168 hours. BNP (last 3 results) No results for input(s): "PROBNP" in the last 8760 hours. HbA1C: Recent Labs    09/23/21 0455  HGBA1C 4.9   CBG: Recent Labs  Lab 09/22/21 1610 09/22/21 1813 09/22/21 1833 09/23/21 0735  GLUCAP 68* 59* 83 95   Lipid Profile: Recent Labs    09/23/21 0455  CHOL 136  HDL 59  LDLCALC 64  TRIG 64  CHOLHDL 2.3   Thyroid Function Tests: Recent Labs    09/23/21 0854  TSH 2.183   Anemia Panel: Recent Labs    09/23/21 0455  VITAMINB12 400  FOLATE 10.3  TIBC 294  IRON 52   Sepsis Labs: No results for input(s): "PROCALCITON", "LATICACIDVEN" in the last 168 hours.  No results found for this or any previous visit (from the past 240 hour(s)).       Radiology Studies: CT ANGIO HEAD W OR WO CONTRAST  Result Date: 09/23/2021 CLINICAL DATA:  Follow-up examination for stroke. EXAM: CT ANGIOGRAPHY HEAD AND NECK TECHNIQUE: Multidetector CT imaging of the head and neck was performed using the standard protocol during bolus administration of intravenous contrast. Multiplanar CT image reconstructions and MIPs were obtained to evaluate the vascular  anatomy. Carotid stenosis measurements (when applicable) are obtained utilizing NASCET criteria, using the distal internal carotid diameter as the denominator. RADIATION DOSE REDUCTION: This exam was performed according to the departmental dose-optimization program which includes automated exposure control, adjustment of the mA and/or kV according to patient size and/or use of iterative reconstruction technique. CONTRAST:  40mL OMNIPAQUE IOHEXOL 350 MG/ML SOLN COMPARISON:  Prior studies from 09/22/2021. FINDINGS: CTA NECK FINDINGS Aortic arch: Examination degraded by motion artifact. Visualized aortic arch normal caliber with standard 3 vessel morphology. Mild atheromatous change about the arch itself. No stenosis about the origin the great vessels. Right carotid system: Right common and internal carotid arteries are tortuous but widely patent without stenosis or dissection. Left carotid system: Left common and internal carotid arteries are tortuous but widely patent without stenosis or dissection. Vertebral arteries: Both vertebral arteries arise from  subclavian arteries. No proximal subclavian artery stenosis. Vertebral arteries mildly tortuous bilaterally. Evaluation of the distal V3 segments somewhat limited by motion. Visualized vertebral arteries patent without stenosis or dissection. Skeleton: No discrete or worrisome osseous lesions. Moderate spondylosis present at C4-5 through C6-7. Degenerative changes noted about the left TMJ. Other neck: No other acute soft tissue abnormality within the neck. Upper chest: Visualized upper chest demonstrates no acute finding. Review of the MIP images confirms the above findings CTA HEAD FINDINGS Anterior circulation: Evaluation of the intracranial circulation limited by motion artifact and patient positioning. Petrous segments patent bilaterally. Scattered atheromatous change within the carotid siphons without hemodynamically significant stenosis. A1 segments patent  bilaterally. Right A1 hypoplastic. Normal anterior communicating artery complex. Both ACAs patent without visible stenosis. Left ACA dominant. No M1 stenosis or occlusion. No proximal MCA branch occlusion. Distal MCA branches perfused and symmetric. Posterior circulation: Both vertebral arteries patent without stenosis. Both PICA patent. Basilar patent to its distal aspect without stenosis. Superior cerebral arteries patent bilaterally. Both PCAs primarily supplied via the basilar well perfused or distal aspects. Venous sinuses: Grossly patent allowing for timing the contrast bolus and motion. Anatomic variants: None significant.  No aneurysm. Review of the MIP images confirms the above findings IMPRESSION: 1. Negative CTA for large vessel occlusion or other emergent finding. 2. Mild atheromatous change about the carotid siphons without hemodynamically significant or correctable stenosis. 3. Diffuse tortuosity about the major arterial vasculature of the head and neck, suggesting chronic underlying hypertension. Electronically Signed   By: Rise Mu M.D.   On: 09/23/2021 03:54   CT ANGIO NECK W OR WO CONTRAST  Result Date: 09/23/2021 CLINICAL DATA:  Follow-up examination for stroke. EXAM: CT ANGIOGRAPHY HEAD AND NECK TECHNIQUE: Multidetector CT imaging of the head and neck was performed using the standard protocol during bolus administration of intravenous contrast. Multiplanar CT image reconstructions and MIPs were obtained to evaluate the vascular anatomy. Carotid stenosis measurements (when applicable) are obtained utilizing NASCET criteria, using the distal internal carotid diameter as the denominator. RADIATION DOSE REDUCTION: This exam was performed according to the departmental dose-optimization program which includes automated exposure control, adjustment of the mA and/or kV according to patient size and/or use of iterative reconstruction technique. CONTRAST:  18mL OMNIPAQUE IOHEXOL 350 MG/ML  SOLN COMPARISON:  Prior studies from 09/22/2021. FINDINGS: CTA NECK FINDINGS Aortic arch: Examination degraded by motion artifact. Visualized aortic arch normal caliber with standard 3 vessel morphology. Mild atheromatous change about the arch itself. No stenosis about the origin the great vessels. Right carotid system: Right common and internal carotid arteries are tortuous but widely patent without stenosis or dissection. Left carotid system: Left common and internal carotid arteries are tortuous but widely patent without stenosis or dissection. Vertebral arteries: Both vertebral arteries arise from subclavian arteries. No proximal subclavian artery stenosis. Vertebral arteries mildly tortuous bilaterally. Evaluation of the distal V3 segments somewhat limited by motion. Visualized vertebral arteries patent without stenosis or dissection. Skeleton: No discrete or worrisome osseous lesions. Moderate spondylosis present at C4-5 through C6-7. Degenerative changes noted about the left TMJ. Other neck: No other acute soft tissue abnormality within the neck. Upper chest: Visualized upper chest demonstrates no acute finding. Review of the MIP images confirms the above findings CTA HEAD FINDINGS Anterior circulation: Evaluation of the intracranial circulation limited by motion artifact and patient positioning. Petrous segments patent bilaterally. Scattered atheromatous change within the carotid siphons without hemodynamically significant stenosis. A1 segments patent bilaterally. Right A1 hypoplastic. Normal anterior communicating  artery complex. Both ACAs patent without visible stenosis. Left ACA dominant. No M1 stenosis or occlusion. No proximal MCA branch occlusion. Distal MCA branches perfused and symmetric. Posterior circulation: Both vertebral arteries patent without stenosis. Both PICA patent. Basilar patent to its distal aspect without stenosis. Superior cerebral arteries patent bilaterally. Both PCAs primarily  supplied via the basilar well perfused or distal aspects. Venous sinuses: Grossly patent allowing for timing the contrast bolus and motion. Anatomic variants: None significant.  No aneurysm. Review of the MIP images confirms the above findings IMPRESSION: 1. Negative CTA for large vessel occlusion or other emergent finding. 2. Mild atheromatous change about the carotid siphons without hemodynamically significant or correctable stenosis. 3. Diffuse tortuosity about the major arterial vasculature of the head and neck, suggesting chronic underlying hypertension. Electronically Signed   By: Jeannine Boga M.D.   On: 09/23/2021 03:54   MR BRAIN WO CONTRAST  Result Date: 09/22/2021 CLINICAL DATA:  Initial evaluation for neuro deficit, stroke suspected. EXAM: MRI HEAD WITHOUT CONTRAST TECHNIQUE: Multiplanar, multiecho pulse sequences of the brain and surrounding structures were obtained without intravenous contrast. COMPARISON:  Prior CT from earlier the same day. FINDINGS: Brain: Examination degraded by motion artifact. Mild age-related cerebral atrophy. Patchy T2/FLAIR hyperintensity involving the periventricular deep white matter both cerebral hemispheres, consistent with chronic small vessel ischemic disease, mild for age. Punctate 4 mm focus of diffusion signal abnormality involving the deep white matter of the right frontal centrum semi ovale, consistent with a small acute to early subacute ischemic infarct (series 12, image 41). No other convincing evidence for acute or subacute ischemia. Gray-white matter differentiation otherwise maintained. No visible areas of chronic cortical infarction. No visible acute or chronic intracranial blood products. No mass lesion, midline shift or mass effect. No hydrocephalus or extra-axial fluid collection. Pituitary gland and suprasellar region within normal limits. Vascular: Major intracranial vascular flow voids are maintained. Skull and upper cervical spine:  Craniocervical junction within normal limits. Bone marrow signal intensity grossly normal. No scalp soft tissue abnormality. Sinuses/Orbits: Prior bilateral ocular lens replacement. Paranasal sinuses are largely clear. No significant mastoid effusion. Other: None. IMPRESSION: 1. 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 No associated hemorrhage. 2. No other acute intracranial abnormality. 3. 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 Electronically Signed   By: Jeannine Boga M.D.   On: 09/22/2021 23:02   CT Head Wo Contrast  Result Date: 09/22/2021 CLINICAL DATA:  Mental status change unknown cause. EXAM: CT HEAD WITHOUT CONTRAST TECHNIQUE: Contiguous axial images were obtained from the base of the skull through the vertex without intravenous contrast. RADIATION DOSE REDUCTION: This exam was performed according to the departmental dose-optimization program which includes automated exposure control, adjustment of the mA and/or kV according to patient size and/or use of iterative reconstruction technique. COMPARISON:  None Available. FINDINGS: Brain: Mild atrophy. Negative for hydrocephalus. Negative for acute infarct, hemorrhage, mass. Mild white matter hypodensity consistent with small vessel ischemia Vascular: Negative for hyperdense vessel Skull: Negative Sinuses/Orbits: Paranasal sinuses clear. Bilateral cataract extraction Other: None. IMPRESSION: Mild atrophy and mild chronic microvascular ischemia. No acute intracranial abnormality. Electronically Signed   By: Franchot Gallo M.D.   On: 09/22/2021 16:18   DG Chest 2 View  Result Date: 09/22/2021 CLINICAL DATA:  Altered mental status EXAM: CHEST - 2 VIEW COMPARISON:  Chest x-ray dated August 10, 2021  FINDINGS: Cardiac and mediastinal contours are within normal limits. Status post  transcatheter aortic valve replacement. Large hiatal hernia. Lungs are clear. No large pleural effusion or pneumothorax. 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 IMPRESSION: 1. Clear lungs. 2. Large hiatal hernia. Electronically Signed   By: Yetta Glassman M.D.   On: 09/22/2021 15:46        Scheduled Meds:  [START ON 09/24/2021]  stroke: early stages of recovery book   Does not apply Once   aspirin EC  81 mg Oral Daily   atorvastatin  10 mg Oral QHS   buPROPion  300 mg Oral QPC breakfast   clopidogrel  75 mg Oral Daily   DULoxetine  90 mg Oral QPC breakfast   enoxaparin (LOVENOX) injection  40 mg Subcutaneous Q24H   insulin aspart  0-15 Units Subcutaneous TID AC & HS   metoprolol succinate  25 mg Oral Daily   sodium chloride flush  3 mL Intravenous Q12H   Continuous Infusions:  dextrose 5% lactated ringers 75 mL/hr at 09/23/21 1111     LOS: 1 day    Time spent: 35 minutes.     Elmarie Shiley, MD [facility]   If 7PM-7AM, please contact night-coverage [email or web address]  09/23/2021, 11:27 AM

## 2021-09-23 NOTE — Evaluation (Signed)
Speech Language Pathology Evaluation Patient Details Name: Nicole Graves MRN: [id number] DOB: 09/03/1945 Today's Date: 09/23/2021 Time: 0175-1025 SLP Time Calculation (min) (ACUTE ONLY): 34 min  Problem List:  Patient Active Problem List   Diagnosis Date Noted   Acute metabolic encephalopathy 09/23/2021   Mixed hyperlipidemia due to type 2 diabetes mellitus (HCC) 09/23/2021   Major depressive disorder 09/23/2021   Acute stroke due to ischemia (HCC) 09/22/2021   Chronic kidney disease, stage 3b (HCC) 03/17/2021   Anemia of chronic disease 03/16/2021   Type 2 diabetes mellitus with stage 3b chronic kidney disease, without long-term current use of insulin (HCC) 03/16/2021   S/P TAVR (transcatheter aortic valve replacement) 03/16/2021   TR (tricuspid regurgitation) 09/19/2016   Aortic insufficiency 09/15/2015   SVT (supraventricular tachycardia) (HCC) 09/15/2015   Severe aortic stenosis 09/15/2015   Paroxysmal atrial tachycardia (HCC) 11/27/2013   Past Medical History:  Past Medical History:  Diagnosis Date   Anemia    Arthritis    Knees, neck, back   Chronic lower back pain    with siatica   Colon polyp 1999   Depression    GERD (gastroesophageal reflux disease)    Mild aortic insufficiency    Mild mitral regurgitation    Orthostatic hypotension    PAT (paroxysmal atrial tachycardia) (HCC)    PONV (postoperative nausea and vomiting)    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 Lynnette Caffey and Dr. Laneta Simmers   Severe aortic stenosis    SVT (supraventricular tachycardia) (HCC)    TR (tricuspid regurgitation) 09/19/2016   moderate by echo 01/2020   Past Surgical History:  Past Surgical History:  Procedure Laterality Date   CESAREAN SECTION  1964   X2   CHOLECYSTECTOMY     COSMETIC SURGERY  1980   removal extra fat inner thighs   EYE SURGERY Bilateral     Cataract extraction with IOL    INTRAOPERATIVE TRANSTHORACIC ECHOCARDIOGRAM N/A 03/16/2021   Procedure: INTRAOPERATIVE TRANSTHORACIC ECHOCARDIOGRAM;  Surgeon: Orbie Pyo, MD;  Location: [facility] OR;  Service: Open Heart Surgery;  Laterality: N/A;   RIGHT HEART CATH AND CORONARY ANGIOGRAPHY N/A 02/17/2021   Procedure: RIGHT HEART CATH AND CORONARY ANGIOGRAPHY;  Surgeon: Orbie Pyo, MD;  Location: [facility]AS[facility];  Service: Cardiovascular;  Laterality: N/A;   TOTAL KNEE ARTHROPLASTY Right 11/30/2015   Procedure: RIGHT TOTAL KNEE ARTHROPLASTY;  Surgeon: Ollen Gross, MD;  Location: [facility] ORS;  Service: Orthopedics;  Laterality: Right;   TOTAL KNEE ARTHROPLASTY Left 11/14/2016   Procedure: LEFT TOTAL KNEE ARTHROPLASTY;  Surgeon: Ollen Gross, MD;  Location: [facility] ORS;  Service: Orthopedics;  Laterality: Left;   TRANSCATHETER AORTIC VALVE REPLACEMENT, TRANSFEMORAL Right 03/16/2021   Procedure: TRANSCATHETER AORTIC VALVE REPLACEMENT, TRANSFEMORAL;  Surgeon: Orbie Pyo, MD;  Location: [facility] OR;  Service: Open Heart Surgery;  Laterality: Right;   HPI:  Pt is a 76 year old female who presented with new onset hallucinations and confusion. Per H&P, pt has had several weeks of significant change in mentation with bouts of extreme anxiety and hallucinations according to family. MRI brain 8/9: 4 mm focus of diffusion signal abnormality involving the deep white matter of the right frontal centrum semi ovale, likely a small  acute to early subacute ischemic infarct. PMH: severe aortic stenosis status post TAVR 02/2021, CKD 3b (Baseline Cr 1.3), non-insulin-dependent diabetes mellitus type 2, paroxysmal atrial tachycardia, hyperlipidemia, major depressive disorder.  Assessment / Plan / Recommendation Clinical Impression  Pt participated in speech-language-cognition evaluation. 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 She stated that she resides with her daughter, manages her medications and often watches her  grandchildren. Pt denied any baseline deficits in speech, language, or cognition outside of her recent decline in cognition. Pt stated that she is now confused, has slurred speech and that her communication is "slow". [facility] Mental Status Examination was completed to evaluate the pt's cognitive-linguistic skills. She achieved a score of 7/30 which is below the normal limits of 27 or more out of 30. She exhibited difficulty in the areas of awareness, attention, memory, problem solving, and executive function. Word retrieval difficulty was noted during conversation and auditory comprehension appeared to be negatively impacted by impairments in memory. Pt also presented with mild dysarthria characterized by reduced articulatory precision which negatively impacted speech intelligibility at the conversational level. Skilled SLP services will be continued at this time.     SLP Assessment  SLP Recommendation/Assessment: Patient needs continued Speech Lanaguage Pathology Services SLP Visit Diagnosis: Cognitive communication deficit (R41.841);Dysarthria and anarthria (R47.1)    Recommendations for follow up therapy are one component of a multi-disciplinary discharge planning process, led by the attending physician.  Recommendations may be updated based on patient status, additional functional criteria and insurance authorization.    Follow Up Recommendations   (Continued SLP services at level of care recommended by PT/OT)    Assistance Recommended at Discharge  Frequent or constant Supervision/Assistance  Functional Status Assessment Patient has had a recent decline in their functional status and demonstrates the ability to make significant improvements in function in a reasonable and predictable amount of time.  Frequency and Duration min 2x/week  2 weeks      SLP Evaluation Cognition  Overall Cognitive Status: Impaired/Different from baseline Arousal/Alertness:  Awake/alert Orientation Level: Oriented X4 Year: 2023 Month: August Day of Week: Correct Attention: Focused;Sustained;Selective Focused Attention: Appears intact Sustained Attention: Impaired Selective Attention: Impaired Selective Attention Impairment: Verbal complex Memory: Impaired (Delayed: 1/5; with cues: 2/4) Awareness: Impaired Awareness Impairment: Emergent impairment Problem Solving: Impaired Problem Solving Impairment: Verbal complex (Money: 0/3; time: 0/2) Executive Function: Organizing;Sequencing Sequencing: Impaired Sequencing Impairment: Verbal complex (clock: 0/4) Organizing: Impaired Organizing Impairment: Verbal complex (backward digit span: 0/2)       Comprehension  Auditory Comprehension Overall Auditory Comprehension: Impaired Yes/No Questions: Within Functional Limits Two Step Basic Commands:  (4/4) Multistep Basic Commands:  (1/3) Conversation: Complex Interfering Components: Attention;Processing speed;Working [employer] fast streamer    Expression Expression Primary Mode of Expression: Verbal Verbal Expression Overall Verbal Expression: Impaired Initiation: No impairment Level of Generative/Spontaneous Verbalization: Conversation Naming: Impairment Divergent:  (7 items per minute) Written Expression Dominant Hand: Right   Oral / Motor  Motor Speech Overall Motor Speech: Impaired Respiration: Within functional limits Phonation: Low vocal intensity;Hoarse (hoarsenessa t baseline) Resonance: Within functional limits Articulation: Impaired Level of Impairment: Conversation Intelligibility: Intelligibility reduced Word: 75-100% accurate Phrase: 75-100% accurate Sentence: 75-100% accurate Conversation: 75-100% accurate Motor Planning: Witnin functional limits Motor Speech Errors: Aware           Markesia Crilly I. Vear Clock, MS, CCC-SLP Acute Rehabilitation Services Office number [phone]  Scheryl Marten 09/23/2021, 3:54 PM

## 2021-09-23 NOTE — Consult Note (Addendum)
Neurology Consultation  Reason for Consult:  New onset hallucinations and confusion Referring Physician:  Dr. Sunnie Nielsen  CC: Confusion  History is obtained from:patient and chart  HPI: Nicole Graves is a 76 y.o. female with a history of aortic stenosis s/p TAVR, CKD 3b, DM2, PAT, HLD and MDD who presents with new onset hallucinations and confusion.  She had a covid-19 infection in late June and has been declining overall since then.  Patient is unable to articulate why she is in the hospital but does c/o recent formed hallucinations (for example, a mouse running around inside a light bulb) and was recently found in her garage stating that she was running from zombies.  She has had several recent falls.    ROS:  Unable to obtain due to altered mental status.   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 Lynnette Caffey and Dr. Laneta Simmers   Severe aortic stenosis    SVT (supraventricular tachycardia) (HCC)    TR (tricuspid regurgitation) 09/19/2016   moderate by echo 01/2020     Family History  Problem Relation Age of Onset   Colon cancer Mother    Heart attack Brother    Hypertension Brother    Stroke Neg Hx      Social History:   reports that she has never smoked. She has never used smokeless tobacco. She reports that she does not drink alcohol and does not use drugs.  Medications  Current Facility-Administered Medications:    [START ON 09/24/2021]  stroke: early stages of recovery book, , Does not apply, Once, Shalhoub,  Deno Lunger, MD   acetaminophen (TYLENOL) tablet 650 mg, 650 mg, Oral, Q6H PRN **OR** acetaminophen (TYLENOL) suppository 650 mg, 650 mg, Rectal, Q6H PRN, Shalhoub, Deno Lunger, MD   aspirin EC tablet 81 mg, 81 mg, Oral, Daily, Shalhoub, Deno Lunger, MD   atorvastatin (LIPITOR) tablet 10 mg, 10 mg, Oral, QHS, Shalhoub, Deno Lunger, MD, 10 mg at 09/23/21 0140   buPROPion (WELLBUTRIN XL) 24 hr tablet 300 mg, 300 mg, Oral, QPC breakfast, Shalhoub, Deno Lunger, MD, 300 mg at 09/23/21 0845   [COMPLETED] clopidogrel (PLAVIX) tablet 300 mg, 300 mg, Oral, Once, 300 mg at 09/23/21 0140 **FOLLOWED BY** clopidogrel (PLAVIX) tablet 75 mg, 75 mg, Oral, Daily, Shalhoub, Deno Lunger, MD   dextrose 5 % in lactated ringers infusion, , Intravenous, Continuous, Regalado, Belkys A, MD   DULoxetine (CYMBALTA) DR capsule 90 mg, 90 mg, Oral, QPC breakfast, Shalhoub, Deno Lunger, MD, 90 mg at 09/23/21 0845   enoxaparin (LOVENOX) injection 40 mg, 40 mg, Subcutaneous, Q24H, Shalhoub, Deno Lunger, MD, 40 mg at 09/23/21 0853   insulin aspart (novoLOG) injection 0-15 Units, 0-15 Units, Subcutaneous, TID AC & HS, Shalhoub, Deno Lunger, MD   metoprolol succinate (TOPROL-XL) 24 hr tablet 25 mg, 25 mg, Oral, Daily, Shalhoub, Deno Lunger, MD, 25 mg at 09/23/21 0849   ondansetron (ZOFRAN) tablet 4 mg, 4 mg, Oral, Q6H  PRN **OR** ondansetron (ZOFRAN) injection 4 mg, 4 mg, Intravenous, Q6H PRN, Shalhoub, Deno Lunger, MD   polyethylene glycol (MIRALAX / GLYCOLAX) packet 17 g, 17 g, Oral, Daily PRN, Shalhoub, Deno Lunger, MD   potassium chloride 10 mEq in 100 mL IVPB, 10 mEq, Intravenous, Q1 Hr x 2, Regalado, Belkys A, MD, Last Rate: 100 mL/hr at 09/23/21 0839, 10 mEq at 09/23/21 0839   sodium chloride flush (NS) 0.9 % injection 3 mL, 3 mL, Intravenous, Q12H, Shalhoub, Deno Lunger, MD, 3 mL at 09/23/21 0850   Exam: Current vital signs: BP 128/70 (BP Location: Left Arm)   Pulse 92   Temp 98.2 F (36.8 C) (Oral)   Resp 18   Ht 5\' 4"  (1.626 m)   Wt 83 kg   SpO2 98%    BMI 31.41 kg/m  Vital signs in last 24 hours: Temp:  [97.5 F (36.4 C)-98.5 F (36.9 C)] 98.2 F (36.8 C) (08/10 0826) Pulse Rate:  [81-100] 92 (08/10 0826) Resp:  [13-24] 18 (08/10 0826) BP: (110-155)/(63-137) 128/70 (08/10 0826) SpO2:  [92 %-100 %] 98 % (08/10 0826) Weight:  [83 kg] 83 kg (08/09 1823)  GENERAL: Awake, alert, in no acute distress Psych: Affect appropriate for situation, patient is calm and cooperative with examination Head: Normocephalic and atraumatic, without obvious abnormality EENT: Normal conjunctivae, moist mucous membranes, no OP obstruction LUNGS: Normal respiratory effort. Non-labored breathing on room air Extremities: warm, well perfused, without obvious deformity  NEURO:  Mental Status: Awake, alert, and oriented to person, place, time (off by two days), and situation. She is not able to provide a clear and coherent history of present illness.  Able to name only two animals with four feet when asked. 3/3 registration, 0/3 recall Speech/Language: Superficially, her speech is clear, but prosodic content of speech has an odd quality and there is subtle intermittent dysarthria.   Fluency, and comprehension intact without receptive or expressive aphasia; however, errors are made when asked to repeat a phrase.  No neglect is noted Cranial Nerves:  II: PERRL visual fields full.  III, IV, VI: EOMI. Lid elevation symmetric and full.  V: Sensation is intact to light touch and symmetrical to face. VII: Face is symmetric resting and smiling.  VIII: Hearing intact to voice IX, X: Phonation normal.  XII: Tongue protrudes midline without fasciculations.   Motor: 5/5 strength is all muscle groups.  Tone is normal. Bulk is normal.  Sensation: Intact to light touch bilaterally in all four extremities.  Coordination: FTN intact bilaterally. No pronator drift.  Gait: Deferred   Labs I have reviewed labs in epic and the results pertinent to this consultation  are:   CBC    Component Value Date/Time   WBC 5.3 09/23/2021 0455   RBC 3.16 (L) 09/23/2021 0455   HGB 9.7 (L) 09/23/2021 0455   HGB 11.6 02/12/2021 0915   HCT 29.8 (L) 09/23/2021 0455   HCT 35.6 02/12/2021 0915   PLT 203 09/23/2021 0455   PLT 308 02/12/2021 0915   MCV 94.3 09/23/2021 0455   MCV 88 02/12/2021 0915   MCH 30.7 09/23/2021 0455   MCHC 32.6 09/23/2021 0455   RDW 13.5 09/23/2021 0455   RDW 13.5 02/12/2021 0915   LYMPHSABS 2.1 09/23/2021 0455   MONOABS 0.4 09/23/2021 0455   EOSABS 0.1 09/23/2021 0455   BASOSABS 0.0 09/23/2021 0455    CMP     Component Value Date/Time   NA 139 09/23/2021 0455   NA 139 03/24/2021 1453  K 3.4 (L) 09/23/2021 0455   CL 108 09/23/2021 0455   CO2 25 09/23/2021 0455   GLUCOSE 90 09/23/2021 0455   BUN 19 09/23/2021 0455   BUN 23 03/24/2021 1453   CREATININE 1.18 (H) 09/23/2021 0455   CALCIUM 9.1 09/23/2021 0455   PROT 5.3 (L) 09/23/2021 0455   ALBUMIN 2.9 (L) 09/23/2021 0455   AST 12 (L) 09/23/2021 0455   ALT 8 09/23/2021 0455   ALKPHOS 40 09/23/2021 0455   BILITOT 0.5 09/23/2021 0455   GFRNONAA 47 (L) 09/23/2021 0455   GFRAA 50 (L) 01/17/2020 1134    Lipid Panel     Component Value Date/Time   CHOL 136 09/23/2021 0455   TRIG 64 09/23/2021 0455   HDL 59 09/23/2021 0455   CHOLHDL 2.3 09/23/2021 0455   VLDL 13 09/23/2021 0455   LDLCALC 64 09/23/2021 0455     Imaging I have reviewed the images obtained:  CT-scan of the brain: atrophy and chronic microvascular ischemia  MRI examination of the brain: 70mm spot seen in deep white matter of right frontal centrum ovale on DWI likely represents T2 shine-through artifact rather than a stroke.  Spot is bland/hazy rather than markedly hyperintense and has no hypointense correlate on ADC.  Assessment: 76 year old female presenting with new onset hallucinations and confusion with recent decline in health since covid-19 infection in June.  UA negative for UTI and MRI likely does  not show stroke.  WBC is normal and patient is afebrile. Patient is unable to provide a good history, and exam is consistent with memory impairment caused by dementia vs. delirium.  Patient is aware that she has had hallucinations but denies any at this time.  She is aware that she has been having falls frequently and will need PT evaluation for safety. - Cognitive exam reveals cognitive impairments in 3 domains: Memory, concentration and speech. Her hallucinations are suggestive of either Lewy body dementia or Alzheimer's dementia - MRI examination of the brain read by Radiology as being most consistent with a small right frontal lobe stroke. However, on personal review of the images, the 47mm spot seen in the deep white matter of the right frontal centrum ovale on DWI most likely represents T2 shine-through artifact rather than a stroke.  Spot is bland/hazy rather than markedly hyperintense and has no hypointense correlate on ADC. - EEG: - Continuous slow, generalized. This study is suggestive of moderate diffuse encephalopathy, nonspecific to etiology. No seizures or epileptiform discharges were seen throughout the recording.    Impression: New onset hallucinations and confusion, most likely caused by an incipient dementia, possibly exacerbated by psychosocial stressors or metabolic derangements.   Recommendations: - Full cognitive evaluation by SLP - Send thiamine, B12, TSH, RPR - Delirium precautions - PT/OT evaluation for safety give recent frequent falls - Avoid antipsychotic medications - Also avoid deliriogenic meds including anticholinergics such as Benadryl and sedating medications such as Valium or Ativan - May benefit from a trial of Aricept  - Outpatient follow up for dementia evaluation with [facility].   Pt seen by NP/Neuro and later by MD.  Marjorie Smolder , MSN, AGACNP-BC [facility] See Amion for schedule and pager information 09/23/2021  9:55 AM  I have seen and examined the patient. I have formulated the assessment and recommendations. 76 year old female presenting with new onset hallucinations and confusion with recent decline in health since covid-19 infection in June. Exam findings are suggestive of a possible  incipient dementia. Recommendations as above.  Electronically signed: Dr. Caryl Pina

## 2021-09-23 NOTE — Assessment & Plan Note (Addendum)
.   Continuing home regimen of lipid lowering therapy. . Will titrate therapy if LDL is greater than 70

## 2021-09-23 NOTE — Evaluation (Signed)
Physical Therapy Evaluation Patient Details Name: Nicole Graves MRN: [id number] DOB: [birth year] Today's Date: 09/23/2021  History of Present Illness  76 yo female presents to ED on 8/9 with falls, hallucinations, decreased intake. workup for AME, vs deficits from CVA. 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  Clinical Impression   Pt presents with generalized but not focal weakness, impaired balance, impaired attention and safety awareness, and decreased activity tolerance. Pt to benefit from acute PT to address deficits. Pt ambulated hallway distance with use of RW and PT assist to steady and guide pt/RW, at baseline pt ambulatory without AD. Pt attempted to walk without AD and was very unsteady and reaching for environment. PT to progress mobility as tolerated, and will continue to follow acutely.         Recommendations for follow up therapy are one component of a multi-disciplinary discharge planning process, led by the attending physician.  Recommendations may be updated based on patient status, additional functional criteria and insurance authorization.  Follow Up Recommendations Outpatient PT      Assistance Recommended at Discharge Intermittent Supervision/Assistance  Patient can return home with the following  A little help with walking and/or transfers;A little help with bathing/dressing/bathroom;Assistance with cooking/housework    Equipment Recommendations None recommended by PT  Recommendations for Other Services       Functional Status Assessment Patient has had a recent decline in their functional status and demonstrates the ability to make significant improvements in function in a reasonable and predictable amount of time.     Precautions /  Restrictions Precautions Precautions: Fall      Mobility  Bed Mobility Overal bed mobility: Needs Assistance Bed Mobility: Supine to Sit, Sit to Supine     Supine to sit: Min assist Sit to supine: Min assist   General bed mobility comments: assist for LE progression, increased time and effort.    Transfers Overall transfer level: Needs assistance Equipment used: None Transfers: Sit to/from Stand Sit to Stand: Min assist           General transfer comment: assist to steady, correct posterior bias    Ambulation/Gait Ambulation/Gait assistance: Min assist Gait Distance (Feet): 110 Feet Assistive device: Rolling walker (2 wheels) Gait Pattern/deviations: Step-through pattern, Decreased stride length, Drifts right/left, Trunk flexed Gait velocity: decr     General Gait Details: assist to steady, guide pt and RW trajectory. Pt veering into objects on L multiple times requiring PT assist to correct and cues for walking in middle of hallway  Stairs            Wheelchair Mobility    Modified Rankin (Stroke Patients Only) Modified Rankin (Stroke Patients Only) Pre-Morbid Rankin Score: No symptoms Modified Rankin: Moderately severe disability     Balance Overall balance assessment: Needs assistance Sitting-balance support: Feet supported, No upper extremity supported Sitting balance-Leahy Scale: Good     Standing balance support: Bilateral upper extremity supported, During functional activity Standing balance-Leahy Scale: Fair Standing balance comment: attempted gait without RW but very unsteady, can stand statically without AD                             Pertinent Vitals/Pain Pain Assessment Pain Assessment: No/denies pain  Home Living Family/patient expects to be discharged to:: Private residence Living Arrangements: Children Available Help at Discharge: Family;Available 24 hours/day Type of Home: House Home Access: Stairs to  enter Entrance Stairs-Rails: Right Entrance Stairs-Number of Steps: 3   Home Layout: Able to live on main level with bedroom/bathroom Home Equipment: Agricultural consultant (2 wheels);Cane - single point      Prior Function Prior Level of Function : Independent/Modified Independent                     Hand Dominance   Dominant Hand: Right    Extremity/Trunk Assessment   Upper Extremity Assessment Upper Extremity Assessment: Defer to OT evaluation    Lower Extremity Assessment Lower Extremity Assessment: RLE deficits/detail;LLE deficits/detail RLE Deficits / Details: 4/5 hip abd/add, knee flexion/extension RLE Sensation:  (reports pins and needles bilat at medial knee) RLE Coordination: decreased gross motor LLE Deficits / Details: 4/5 hip abd/add, knee flexion/extension LLE Sensation:  (reports pins and needles bilat at medial knee) LLE Coordination: decreased gross motor    Cervical / Trunk Assessment Cervical / Trunk Assessment: Normal  Communication   Communication: No difficulties  Cognition Arousal/Alertness: Awake/alert Behavior During Therapy: Anxious Overall Cognitive Status: Impaired/Different from baseline Area of Impairment: Attention, Memory, Following commands, Safety/judgement, Problem solving                   Current Attention Level: Sustained Memory: Decreased short-term memory Following Commands: Follows one step commands with increased time Safety/Judgement: Decreased awareness of safety, Decreased awareness of deficits   Problem Solving: Difficulty sequencing, Requires verbal cues, Requires tactile cues, Slow processing General Comments: pt appears anxious on eval, with UE tremors and periods of not being attentive to PT questions/commands. Pt with poor safety awareness with use of RW, requires cues for avoiding running into objects, placement in RW as pt letting RW slide laterally when distracted.        General Comments       Exercises     Assessment/Plan    PT Assessment Patient needs continued PT services  PT Problem List Decreased mobility;Decreased strength;Decreased safety awareness;Decreased activity tolerance;Decreased balance;Decreased knowledge of use of DME;Pain;Decreased cognition       PT Treatment Interventions DME instruction;Therapeutic activities;Gait training;Therapeutic exercise;Patient/family education;Balance training;Stair training;Functional mobility training;Neuromuscular re-education    PT Goals (Current goals can be found in the Care Plan section)  Acute Rehab PT Goals Patient Stated Goal: home PT Goal Formulation: With patient Time For Goal Achievement: 10/07/21 Potential to Achieve Goals: Good    Frequency Min 3X/week     Co-evaluation               AM-PAC PT "6 Clicks" Mobility  Outcome Measure Help needed turning from your back to your side while in a flat bed without using bedrails?: A Little Help needed moving from lying on your back to sitting on the side of a flat bed without using bedrails?: A Little Help needed moving to and from a bed to a chair (including a wheelchair)?: A Little Help needed standing up from a chair using your arms (e.g., wheelchair or bedside chair)?: A Little Help needed to walk in hospital room?: A Little Help needed climbing 3-5 steps with a railing? : A Lot 6 Click Score: 17    End of Session   Activity Tolerance: Patient limited by fatigue Patient left: in bed;with call bell/phone within reach;with bed alarm set Nurse Communication: Mobility status PT Visit Diagnosis: Other abnormalities of  gait and mobility (R26.89);Muscle weakness (generalized) (M62.81)    Time: 6256-3893 PT Time Calculation (min) (ACUTE ONLY): 15 min   Charges:   PT Evaluation $PT Eval Low Complexity: 1 Low        Hedaya Latendresse S, PT DPT Acute Rehabilitation Services Pager [phone]  Office [phone]   [name] 09/23/2021, 4:17 PM

## 2021-09-23 NOTE — Assessment & Plan Note (Signed)
Strict intake and output monitoring Creatinine near baseline Minimizing nephrotoxic agents as much as possible Serial chemistries to monitor renal function and electrolytes  

## 2021-09-23 NOTE — Assessment & Plan Note (Signed)
   Currently normal sinus rhythm on initial EKG, second EKG has a computer interpretation of atrial fibrillation which I believe is incorrect  Continuing home regimen of scheduled low-dose metoprolol  Monitoring patient on telemetry

## 2021-09-23 NOTE — Care Plan (Addendum)
Patient seen and examined.  Admitted early morning hours by nighttime hospitalist.  Seen by hospitalist at [facility] long before transfer to the hospital.  I went to evaluate the patient and also met with family at the bedside.  Patient does not have any complaints today but she is tired.  Detailed discussion with patient's family including daughter and granddaughter at the bedside.  Patient lives at home with multiple family members and only occasionally alone in the daytime when they go to work.  Patient tells me that she is seeing walking dead episodes in her real-life for about 1 week.  No recent change in medications.  No recent infection or medication uses.  No focal deficits today.  MRI with some frontal lobe abnormalities and neurology thinks this is probably artifact.  Magnesium was critically low, replaced with IV, will continue oral replacement.  Recheck tomorrow. TSH is normal.  B12, B1 levels pending. Urinalysis done on admission looks clear. Family reported an episode of sudden mumbling and incomprehensible state and happen intermittently. Remains neurologically stable with no focal deficits.  Delirium/cognitive decline/hallucinations: Ongoing neurological workup. Close monitoring in the hospital.  PT OT. Avoid all benzodiazepines and narcotics.  Can use atypical antipsychotics if needed. Resume home doses of gabapentin and SSRI, will use lidocaine patch to left shoulder and discontinue tramadol.   No charge visit.  Addendum: Patient had an episode of incoherence, mumbling for few minutes and then became coherent again. Further history reviewed revealed that she is on [name] which was prescribed by her psychiatrist in addition to Wellbutrin and Cymbalta to control her severe depressive disorder. Does not have any history of hallucinations or delusions.  Never diagnosed with schizophrenia.  EEG was ordered.  Ongoing neurological workup.  Daughter updated.

## 2021-09-23 NOTE — Procedures (Signed)
Patient Name: Nicole Graves  MRN: [id number]  Epilepsy Attending: Charlsie Quest  Referring Physician/Provider: Marjorie Smolder, NP  Date: 09/23/2021 Duration: 22.23 mins  Patient history: 76yo F with ams. EEG to evaluate for seizure  Level of alertness: Awake  AEDs during EEG study: None  Technical aspects: This EEG study was done with scalp electrodes positioned according to the 10-20 International system of electrode placement. Electrical activity was reviewed with band pass filter of 1-70Hz , sensitivity of 7 uV/mm, display speed of 62mm/sec with a 60Hz  notched filter applied as appropriate. EEG data were recorded continuously and digitally stored.  Video monitoring was available and reviewed as appropriate.  Description: No posterior dominant rhythm was seen. EEG showed continuous generalized 3 to 6 Hz theta-delta slowing.  Hyperventilation and photic stimulation were not performed.       ABNORMALITY - Continuous slow, generalized   IMPRESSION: This study is suggestive of moderate diffuse encephalopathy, nonspecific etiology. No seizures or epileptiform discharges were seen throughout the recording.     Bethanne Mule 

## 2021-09-23 NOTE — Assessment & Plan Note (Signed)
.   Patient been placed on Accu-Cheks before every meal and nightly with sliding scale insulin . Holding home regimen of hypoglycemics . Hemoglobin A1C ordered . Diabetic Diet  

## 2021-09-23 NOTE — Progress Notes (Signed)
Was called from the stroke team to come assess an NIHSS for this pt. NIHSS charted (see documentation for details). Pt and family updated as well as bedside RN. Rashi Granier, [place] Scrape, RN SRN

## 2021-09-23 NOTE — Assessment & Plan Note (Signed)
   Continue home regimen of psychotropic therapy 

## 2021-09-23 NOTE — Progress Notes (Signed)
EEG complete - results pending 

## 2021-09-23 NOTE — Assessment & Plan Note (Addendum)
   Patient exhibiting several weeks of significant change in mentation with bouts of extreme anxiety and hallucinations according to family  Area of the stroke involvement may have precipitated the patient's change in mentation  Evaluating for any evidence of infection, obtaining vitamin B12, urinalysis  Reviewing home medications

## 2021-09-23 NOTE — H&P (Addendum)
History and Physical    Patient: Nicole Graves MRN: [id number] DOA: 09/22/2021  Date of Service: the patient was seen and examined on 09/23/2021  Patient coming from: Home via EMS  Chief Complaint:  Chief Complaint  Patient presents with   Hallucinations    HPI:   76 year old female with past medical history of severe aortic stenosis status post TAVR 02/2021, CKD 3b (Baseline Cr 1.3), non-insulin-dependent diabetes mellitus type 2, paroxysmal atrial tachycardia, hyperlipidemia, major depressive disorder who presents to [facility] emergency department via EMS with new onset hallucinations and confusion.  Patient is an extremely poor historian due to confusion and therefore majority history has been obtained from discussions with emergency department staff, review of EMS/triage notes and discussions with family.  Family reports that since the patient was diagnosed with COVID-19 in late June she has exhibited a gradual decline.  This has included progressively worsening generalized weakness and decreased appetite.  Patient is worsening weakness over the span of time has resulted in intermittent falls with family reporting 3 falls in his many days without any observed loss of consciousness.  In particular, over the past several days the patient has been exhibiting bouts of anxiety with hallucinations, stating that she is "running away from zombies."  Due to patient's progressively worsening weakness with bouts of agitation and confusion EMS was eventually contacted.  EMS promptly came to evaluate the patient and brought her in to [facility] emergency department for evaluation.  Upon evaluation in the emergency department initial workup did not identify an obvious cause of her symptoms and therefore MRI brain without contrast was performed revealing evidence of right frontal centrum semiovale acute to subacute infarct.  ER provider discussed case with Dr. Rory Percy with neurology who  recommended admission to [facility] for usual stroke workup and neurology consultation.    Review of Systems: Review of Systems  Unable to perform ROS: Mental status change     Past Medical History:  Diagnosis Date   Anemia    Arthritis    Knees, neck, back   Chronic lower back pain    with siatica   Colon polyp 1999   Depression    GERD (gastroesophageal reflux disease)    Mild aortic insufficiency    Mild mitral regurgitation    Orthostatic hypotension    PAT (paroxysmal atrial tachycardia) (HCC)    PONV (postoperative nausea and vomiting)    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 Ali Lowe and Dr. Cyndia Bent   Severe aortic stenosis    SVT (supraventricular tachycardia) (HCC)    TR (tricuspid regurgitation) 09/19/2016   moderate by echo 01/2020    Past Surgical History:  Procedure Laterality Date   CESAREAN SECTION  1964   [place]   removal extra fat inner thighs   EYE SURGERY Bilateral     Cataract extraction with IOL   INTRAOPERATIVE TRANSTHORACIC ECHOCARDIOGRAM N/A 03/16/2021   Procedure: INTRAOPERATIVE TRANSTHORACIC ECHOCARDIOGRAM;  Surgeon: Early Osmond, MD;  Location: [place];  Service: Open Heart Surgery;  Laterality: N/A;   RIGHT HEART CATH AND CORONARY ANGIOGRAPHY N/A 02/17/2021   Procedure: RIGHT HEART CATH AND CORONARY ANGIOGRAPHY;  Surgeon: Early Osmond, MD;  Location: [place] [facility];  Service: Cardiovascular;  Laterality: N/A;   TOTAL KNEE ARTHROPLASTY Right 11/30/2015   Procedure: RIGHT  TOTAL KNEE ARTHROPLASTY;  Surgeon: Ollen Gross, MD;  Location: [facility];  Service: Orthopedics;  Laterality: Right;   TOTAL KNEE ARTHROPLASTY Left 11/14/2016   Procedure: LEFT TOTAL KNEE ARTHROPLASTY;  Surgeon: Ollen Gross, MD;  Location: [facility];  Service: Orthopedics;  Laterality: Left;   TRANSCATHETER AORTIC VALVE  REPLACEMENT, TRANSFEMORAL Right 03/16/2021   Procedure: TRANSCATHETER AORTIC VALVE REPLACEMENT, TRANSFEMORAL;  Surgeon: Orbie Pyo, MD;  Location: [facility];  Service: Open Heart Surgery;  Laterality: Right;    Social History:  reports that she has never smoked. She has never used smokeless tobacco. She reports that she does not drink alcohol and does not use drugs.  Allergies  Allergen Reactions   Sulfa Antibiotics Nausea And Vomiting and Other (See Comments)   Ativan [Lorazepam] Other (See Comments)    Hyperactive    Tizanidine Hcl Other (See Comments)    Pt does not remember     Family History  Problem Relation Age of Onset   Colon cancer Mother    Heart attack Brother    Hypertension Brother    Stroke Neg Hx     Prior to Admission medications   Medication Sig Start Date End Date Taking? Authorizing Provider  acetaminophen (TYLENOL) 650 MG CR tablet Take 1,300 mg by mouth every 8 (eight) hours.    [provider]  alendronate (FOSAMAX) 70 MG tablet Take 1 tablet by mouth every Thursday.    [provider]  aspirin EC 81 MG tablet Take 81 mg by mouth daily. Swallow whole.    [provider]  atorvastatin (LIPITOR) 10 MG tablet Take 10 mg by mouth at bedtime.  02/11/15   [provider]  benzonatate (TESSALON) 100 MG capsule Take 1 capsule (100 mg total) by mouth 3 (three) times daily as needed for cough. 08/06/21   Waldon Merl, PA-C  buPROPion (WELLBUTRIN XL) 300 MG 24 hr tablet Take 300 mg by mouth daily after breakfast.    [provider]  calcium carbonate (OS-CAL) 600 MG TABS tablet Take 600 mg by mouth 2 (two) times daily with a meal.    [provider]  cyclobenzaprine (FLEXERIL) 10 MG tablet Take 10 mg by mouth at bedtime.    [provider]  DULoxetine (CYMBALTA) 30 MG capsule Take 90 mg by mouth daily after breakfast.    [provider]  gabapentin (NEURONTIN) 300 MG capsule Take 300 mg by mouth  2 (two) times daily. 12/20/19   [provider]  Iron, Ferrous Sulfate, 325 (65 Fe) MG TABS Take 325 mg by mouth daily. 12/16/20   [provider]  Lurasidone HCl 60 MG TABS Take 60 mg by mouth daily with supper. [name]    [provider]  metFORMIN (GLUCOPHAGE) 500 MG tablet Take 500 mg by mouth 2 (two) times daily. 11/04/17   [provider]  metoprolol succinate (TOPROL XL) 25 MG 24 hr tablet Take 1 tablet (25 mg total) by mouth in the morning. 06/22/21   Quintella Reichert, MD  traMADol (ULTRAM) 50 MG tablet Take 50 mg by mouth at bedtime.    [provider]  vitamin B-12 (CYANOCOBALAMIN) 1000 MCG tablet Take 1,000 mcg by mouth daily.    [provider]    Physical Exam:  Vitals:   09/23/21 0457 09/23/21 0500 09/23/21 0655 09/23/21 0736  BP:  132/67 (!) 155/82 (!) 148/137  Pulse:   88 93  Resp:  18 (!) 21 13  Temp: 98.3 F (  36.8 C)   97.7 F (36.5 C)  TempSrc: Oral     SpO2:   97% 97%  Weight:      Height:        Constitutional:, Awake, alert oriented x 1, in distress due to agitation. Skin: no rashes, no lesions, somewhat poor skin turgor noted. Eyes: Pupils are equally reactive to light.  No evidence of scleral icterus or conjunctival pallor.  ENMT: Dry mucous membranes noted.  Posterior pharynx clear of any exudate or lesions.   Neck: normal, supple, no masses, no thyromegaly.  No evidence of jugular venous distension.   Respiratory: clear to auscultation bilaterally, no wheezing, no crackles. Normal respiratory effort. No accessory muscle use.  Cardiovascular: Regular rate and rhythm, no murmurs / rubs / gallops. No extremity edema. 2+ pedal pulses. No carotid bruits.  Chest:   Nontender without crepitus or deformity.   Back:   Nontender without crepitus or deformity. Abdomen: Abdomen is soft and nontender.  No evidence of intra-abdominal masses.  Positive bowel sounds noted in all quadrants.   Musculoskeletal: No joint  deformity upper and lower extremities. Good ROM, no contractures. Normal muscle tone.  Neurologic: Patient is intermittently following commands.  Patient is able to move all 4 extremities spontaneously.  Sensation is grossly intact.  Patient is responsive to verbal stimuli Psychiatric: Unable to fully assess due to significant agitation and confusion.  Patient is visibly agitated, not able to answer questions appropriately.  Patient currently does not seem to possess insight as to her current situation.    Data Reviewed:  I have personally reviewed and interpreted labs, imaging.  Significant findings are:  CBC revealing white blood cell count of 5.9, hemoglobin 10.9, hematocrit 33.9, platelet count 238 Chemistry revealing sodium 139, potassium 3.5, chloride 105, bicarbonate 26, BUN 28, creatinine 1.35 Urinalysis unremarkable CXR:  Chest X-ray was personally reviewed.  No evidence of focal infiltrates.  No evidence of pleural effusion.  No evidence of pneumothorax.  MRI brain without contrast revealing 4 mm focus of diffusion signal abnormality involving the deep white matter of the right frontal centrum semiovale likely small acute/subacute ischemic infarct without hemorrhage.  EKG: Personally reviewed.  Rhythm is normal sinus rhythm with heart rate of 84 bpm.  No dynamic ST segment changes appreciated.   Assessment and Plan: * Acute stroke due to ischemia [facility]) Patient may have been predisposed to acute stroke due to recent COVID-19 infection. Performing serial neurologic checks Monitoring patient on telemetry Initiating antiplatelet therapy including aspirin 325 mg followed by 81 mg daily and clopidogrel 300 mg followed by 75 mg daily Daily statin therapy will be titrated if LDL is greater than 70 Further imaging to include: CT angiogram of the head and neck Obtaining hemoglobin A1c and lipid panel in the morning Echocardiogram in the morning with bubble study PT, OT, SLP  evaluation Permissive hypertension with as needed antihypertensives only to be given if blood pressure greater than 220/115 Neurology planning to follow along consultation and will need to be notified upon arrival to [facility], their assistance is appreciated.   Acute metabolic encephalopathy Patient exhibiting several weeks of significant change in mentation with bouts of extreme anxiety and hallucinations according to family Area of the stroke involvement may have precipitated the patient's change in mentation Evaluating for any evidence of infection, obtaining vitamin B12, urinalysis Reviewing home medications  Chronic kidney disease, stage 3b (HCC) Strict intake and output monitoring Creatinine near baseline Minimizing nephrotoxic agents as much as possible Serial  chemistries to monitor renal function and electrolytes   Type 2 diabetes mellitus with stage 3b chronic kidney disease, without long-term current use of insulin (HCC) Patient been placed on Accu-Cheks before every meal and nightly with sliding scale insulin Holding home regimen of hypoglycemics Hemoglobin A1C ordered Diabetic Diet   Paroxysmal atrial tachycardia (HCC) Currently normal sinus rhythm on initial EKG, second EKG has a computer interpretation of atrial fibrillation which I believe is incorrect Continuing home regimen of scheduled low-dose metoprolol Monitoring patient on telemetry  Anemia of chronic disease Notable mild  anemia No clinical evidence of bleeding We will obtain iron panel, folate, vitamin B12 Monitoring hemoglobin and hematocrit with serial CBCs   Mixed hyperlipidemia due to type 2 diabetes mellitus (HCC) Continuing home regimen of lipid lowering therapy. Will titrate therapy if LDL is greater than 70  Major depressive disorder Continue home regimen of psychotropic therapy       Code Status:  Full code    Consults: EDP discussed case with Dr. Wilford Corner with neurology, neurology to  be notified upon arrival to [facility].  Severity of Illness:  The appropriate patient status for this patient is INPATIENT. Inpatient status is judged to be reasonable and necessary in order to provide the required intensity of service to ensure the patient's safety. The patient's presenting symptoms, physical exam findings, and initial radiographic and laboratory data in the context of their chronic comorbidities is felt to place them at high risk for further clinical deterioration. Furthermore, it is not anticipated that the patient will be medically stable for discharge from the hospital within 2 midnights of admission.   * I certify that at the point of admission it is my clinical judgment that the patient will require inpatient hospital care spanning beyond 2 midnights from the point of admission due to high intensity of service, high risk for further deterioration and high frequency of surveillance required.*  Author:  Marinda Elk MD  09/23/2021 8:06 AM

## 2021-09-23 NOTE — ED Notes (Signed)
RN attempted to give report. Receiving floor unable to accept pt at this moment.

## 2021-09-23 NOTE — Progress Notes (Signed)
Echo attempted. Patient with speech therapy and EEG. Will attempt again later as schedule permits.

## 2021-09-23 NOTE — ED Notes (Signed)
Report given to [employer]. [facility] called and transportation has been arranged.

## 2021-09-23 NOTE — ED Notes (Signed)
Pt ambulated to restroom hand held assist.

## 2021-09-24 ENCOUNTER — Encounter (HOSPITAL_COMMUNITY): Payer: Self-pay | Admitting: Internal Medicine

## 2021-09-24 ENCOUNTER — Inpatient Hospital Stay (HOSPITAL_COMMUNITY): Payer: Medicare PPO

## 2021-09-24 DIAGNOSIS — G9341 Metabolic encephalopathy: Secondary | ICD-10-CM | POA: Diagnosis not present

## 2021-09-24 DIAGNOSIS — I6389 Other cerebral infarction: Secondary | ICD-10-CM

## 2021-09-24 DIAGNOSIS — F03918 Unspecified dementia, unspecified severity, with other behavioral disturbance: Secondary | ICD-10-CM | POA: Diagnosis not present

## 2021-09-24 LAB — ECHOCARDIOGRAM COMPLETE BUBBLE STUDY
AR max vel: 3.53 cm2
AV Area VTI: 3.48 cm2
AV Area mean vel: 3.74 cm2
AV Mean grad: 5 mmHg
AV Peak grad: 8.5 mmHg
Ao pk vel: 1.46 m/s
Area-P 1/2: 2.87 cm2
S' Lateral: 2.9 cm

## 2021-09-24 LAB — GLUCOSE, CAPILLARY
Glucose-Capillary: 111 mg/dL — ABNORMAL HIGH (ref 70–99)
Glucose-Capillary: 116 mg/dL — ABNORMAL HIGH (ref 70–99)
Glucose-Capillary: 104 mg/dL — ABNORMAL HIGH (ref 70–99)
Glucose-Capillary: 105 mg/dL — ABNORMAL HIGH (ref 70–99)

## 2021-09-24 LAB — URINE CULTURE: Culture: NO GROWTH

## 2021-09-24 LAB — BASIC METABOLIC PANEL
Anion gap: 7 (ref 5–15)
BUN: 13 mg/dL (ref 8–23)
CO2: 24 mmol/L (ref 22–32)
Calcium: 9.1 mg/dL (ref 8.9–10.3)
Chloride: 107 mmol/L (ref 98–111)
Creatinine, Ser: 1.15 mg/dL — ABNORMAL HIGH (ref 0.44–1.00)
GFR, Estimated: 49 mL/min — ABNORMAL LOW (ref >60)
Glucose, Bld: 102 mg/dL — ABNORMAL HIGH (ref 70–99)
Potassium: 3.8 mmol/L (ref 3.5–5.1)
Sodium: 138 mmol/L (ref 135–145)

## 2021-09-24 LAB — MAGNESIUM: Magnesium: 1.7 mg/dL (ref 1.7–2.4)

## 2021-09-24 LAB — RPR: RPR Ser Ql: NONREACTIVE

## 2021-09-24 LAB — TSH: TSH: 1.604 u[IU]/mL (ref 0.350–4.500)

## 2021-09-24 LAB — PHOSPHORUS: Phosphorus: 2.9 mg/dL (ref 2.5–4.6)

## 2021-09-24 MED ORDER — MELATONIN 5 MG PO TABS
5.0000 mg | ORAL_TABLET | Freq: Every evening | ORAL | Status: DC | PRN
Start: 2021-09-24 — End: 2021-09-27
  Filled 2021-09-24 (×2): qty 1

## 2021-09-24 NOTE — Progress Notes (Signed)
  Echocardiogram 2D Echocardiogram has been performed.  Gerda Diss 09/24/2021, 4:37 PM

## 2021-09-24 NOTE — Plan of Care (Signed)
  Problem: Education: Goal: Ability to describe self-care measures that may prevent or decrease complications (Diabetes Survival Skills Education) will improve Outcome: Progressing Goal: Individualized Educational Video(s) Outcome: Progressing   Problem: Coping: Goal: Ability to adjust to condition or change in health will improve Outcome: Progressing   Problem: Fluid Volume: Goal: Ability to maintain a balanced intake and output will improve Outcome: Progressing   Problem: Health Behavior/Discharge Planning: Goal: Ability to identify and utilize available resources and services will improve Outcome: Progressing Goal: Ability to manage health-related needs will improve Outcome: Progressing   Problem: Metabolic: Goal: Ability to maintain appropriate glucose levels will improve Outcome: Progressing   Problem: Nutritional: Goal: Maintenance of adequate nutrition will improve Outcome: Progressing Goal: Progress toward achieving an optimal weight will improve Outcome: Progressing   Problem: Skin Integrity: Goal: Risk for impaired skin integrity will decrease Outcome: Progressing   Problem: Tissue Perfusion: Goal: Adequacy of tissue perfusion will improve Outcome: Progressing   Problem: Education: Goal: Knowledge of General Education information will improve Description: Including pain rating scale, medication(s)/side effects and non-pharmacologic comfort measures Outcome: Progressing   Problem: Health Behavior/Discharge Planning: Goal: Ability to manage health-related needs will improve Outcome: Progressing   Problem: Clinical Measurements: Goal: Ability to maintain clinical measurements within normal limits will improve Outcome: Progressing Goal: Will remain free from infection Outcome: Progressing Goal: Diagnostic test results will improve Outcome: Progressing Goal: Cardiovascular complication will be avoided Outcome: Progressing   Problem: Activity: Goal: Risk  for activity intolerance will decrease Outcome: Progressing   Problem: Nutrition: Goal: Adequate nutrition will be maintained Outcome: Progressing   Problem: Coping: Goal: Level of anxiety will decrease Outcome: Progressing   Problem: Elimination: Goal: Will not experience complications related to bowel motility Outcome: Progressing Goal: Will not experience complications related to urinary retention Outcome: Progressing   Problem: Pain Managment: Goal: General experience of comfort will improve Outcome: Progressing   Problem: Safety: Goal: Ability to remain free from injury will improve Outcome: Progressing   Problem: Skin Integrity: Goal: Risk for impaired skin integrity will decrease Outcome: Progressing   Problem: Clinical Measurements: Goal: Respiratory complications will improve Outcome: Not Applicable

## 2021-09-24 NOTE — Progress Notes (Signed)
Subjective: Patient having echocardiogram performed  Objective: Current vital signs: BP 126/67 (BP Location: Left Arm)   Pulse 90   Temp 98.7 F (37.1 C) (Oral)   Resp 18   Ht  (1.626 m)   Wt 83 kg   SpO2 98%   BMI 31.41 kg/m  Vital signs in last 24 hours: Temp:  [97.7 F (36.5 C)-98.9 F (37.2 C)] 98.7 F (37.1 C) (08/11 1633) Pulse Rate:  [88-95] 90 (08/11 1633) Resp:  [16-20] 18 (08/11 1633) BP: (117-150)/(62-97) 126/67 (08/11 1633) SpO2:  [97 %-99 %] 98 % (08/11 1633)  Intake/Output from previous day: 08/10 0701 - 08/11 0700 In: 290.9 [P.O.:100; I.V.:190.9] Out: -  Intake/Output this shift: No intake/output data recorded. Nutritional status:  Diet Order             Diet regular Room service appropriate? Yes; Fluid consistency: Thin  Diet effective now                  HEENT: Hydro/AT Lungs: Respirations unlabored  Neurologic Exam: Ment: Lying quietly in bed, with TTE in progress. No agitation noted. CN: Face symmetric. No dyskinesias noted.  Motor: No abnormal movements seen  Lab Results: Results for orders placed or performed during the hospital encounter of 09/22/21 (from the past 48 hour(s))  Urinalysis, Routine w reflex microscopic Urine, Clean Catch     Status: None   Collection Time: 09/22/21  5:16 PM  Result Value Ref Range   Color, Urine YELLOW YELLOW   APPearance CLEAR CLEAR   Specific Gravity, Urine 1.008 1.005 - 1.030   pH 5.0 5.0 - 8.0   Glucose, UA NEGATIVE NEGATIVE mg/dL   Hgb urine dipstick NEGATIVE NEGATIVE   Bilirubin Urine NEGATIVE NEGATIVE   Ketones, ur NEGATIVE NEGATIVE mg/dL   Protein, ur NEGATIVE NEGATIVE mg/dL   Nitrite NEGATIVE NEGATIVE   Leukocytes,Ua NEGATIVE NEGATIVE    Comment: Performed at [facility], [address] [place], [place] [address]  Urine Culture     Status: None   Collection Time: 09/22/21  5:16 PM   Specimen: Urine, Clean Catch  Result Value Ref Range   Specimen Description       URINE, CLEAN CATCH Performed at [facility], [address] [place], [place] [address]    Special Requests      NONE Performed at [facility], [address], [place], [place] [address]    Culture      NO GROWTH Performed at [facility], [address] [place] [place], [place] [address]    Report Status 09/24/2021 FINAL   CBG monitoring, ED     Status: Abnormal   Collection Time: 09/22/21  6:13 PM  Result Value Ref Range   Glucose-Capillary 59 (L) 70 - 99 mg/dL    Comment: Glucose reference range applies only to samples taken after fasting for at least 8 hours.  CBG monitoring, ED     Status: None   Collection Time: 09/22/21  6:33 PM  Result Value Ref Range   Glucose-Capillary 83 70 - 99 mg/dL    Comment: Glucose reference range applies only to samples taken after fasting for at least 8 hours.  Lipid panel     Status: None   Collection Time: 09/23/21  4:55 AM  Result Value Ref Range   Cholesterol 136 0 - 200 mg/dL   Triglycerides 64 <213 mg/dL   HDL 59 >08 mg/dL   Total CHOL/HDL Ratio 2.3  RATIO   VLDL 13 0 - 40 mg/dL   LDL Cholesterol 64 0 - 99 mg/dL    Comment:        Total Cholesterol/HDL:CHD Risk Coronary Heart Disease Risk Table                     Men   Women  1/2 Average Risk   3.4   3.3  Average Risk       5.0   4.4  2 X Average Risk   9.6   7.1  3 X Average Risk  23.4   11.0        Use the calculated Patient Ratio above and the CHD Risk Table to determine the patient's CHD Risk.        ATP III CLASSIFICATION (LDL):  <100     mg/dL   Optimal  409-811  mg/dL   Near or Above                    Optimal  130-159  mg/dL   Borderline  914-782  mg/dL   High  >956     mg/dL   Very High Performed at [facility], [address] [place], [place] [address]   Hemoglobin A1c     Status: None   Collection Time: 09/23/21  4:55 AM  Result Value Ref Range   Hgb A1c MFr Bld 4.9 4.8 - 5.6 %    Comment:  (NOTE) Pre diabetes:          5.7%-6.4%  Diabetes:              >6.4%  Glycemic control for   <7.0% adults with diabetes    Mean Plasma Glucose 93.93 mg/dL    Comment: Performed at [facility], [address] [place], [place] [address]  Comprehensive metabolic panel     Status: Abnormal   Collection Time: 09/23/21  4:55 AM  Result Value Ref Range   Sodium 139 135 - 145 mmol/L   Potassium 3.4 (L) 3.5 - 5.1 mmol/L   Chloride 108 98 - 111 mmol/L   CO2 25 22 - 32 mmol/L   Glucose, Bld 90 70 - 99 mg/dL    Comment: Glucose reference range applies only to samples taken after fasting for at least 8 hours.   BUN 19 8 - 23 mg/dL   Creatinine, Ser 6.96 (H) 0.44 - 1.00 mg/dL   Calcium 9.1 8.9 - 29.5 mg/dL   Total Protein 5.3 (L) 6.5 - 8.1 g/dL   Albumin 2.9 (L) 3.5 - 5.0 g/dL   AST 12 (L) 15 - 41 U/L   ALT 8 0 - 44 U/L   Alkaline Phosphatase 40 38 - 126 U/L   Total Bilirubin 0.5 0.3 - 1.2 mg/dL   GFR, Estimated 47 (L) >60 mL/min    Comment: (NOTE) Calculated using the CKD-EPI Creatinine Equation (2021)    Anion gap 6 5 - 15    Comment: Performed at [facility], [address] [place], [place] [address]  Magnesium     Status: Abnormal   Collection Time: 09/23/21  4:55 AM  Result Value Ref Range   Magnesium 1.3 (L) 1.7 - 2.4 mg/dL    Comment: Performed at [facility], [address], [place], [place] [address]  CBC with Differential/Platelet     Status: Abnormal   Collection Time: 09/23/21  4:55 AM  Result Value Ref Range  WBC 5.3 4.0 - 10.5 K/uL   RBC 3.16 (L) 3.87 - 5.11 MIL/uL   Hemoglobin 9.7 (L) 12.0 - 15.0 g/dL   HCT 36.6 (L) 44.0 - 34.7 %   MCV 94.3 80.0 - 100.0 fL   MCH 30.7 26.0 - 34.0 pg   MCHC 32.6 30.0 - 36.0 g/dL   RDW 42.5 95.6 - 38.7 %   Platelets 203 150 - 400 K/uL   nRBC 0.0 0.0 - 0.2 %   Neutrophils Relative % 50 %   Neutro Abs 2.6 1.7 - 7.7 K/uL   Lymphocytes Relative 40 %   Lymphs Abs 2.1 0.7 - 4.0 K/uL    Monocytes Relative 8 %   Monocytes Absolute 0.4 0.1 - 1.0 K/uL   Eosinophils Relative 2 %   Eosinophils Absolute 0.1 0.0 - 0.5 K/uL   Basophils Relative 0 %   Basophils Absolute 0.0 0.0 - 0.1 K/uL   Immature Granulocytes 0 %   Abs Immature Granulocytes 0.01 0.00 - 0.07 K/uL    Comment: Performed at [facility], [address] [place], [place] [address]  Folate     Status: None   Collection Time: 09/23/21  4:55 AM  Result Value Ref Range   Folate 10.3 >5.9 ng/mL    Comment: Performed at [facility], [address], [place], [place] [address]  Iron and TIBC     Status: None   Collection Time: 09/23/21  4:55 AM  Result Value Ref Range   Iron 52 28 - 170 ug/dL   TIBC 841 660 - 630 ug/dL   Saturation Ratios 18 10.4 - 31.8 %   UIBC 242 ug/dL    Comment: Performed at [facility], [address], [place], [place] [address]  Vitamin B12     Status: None   Collection Time: 09/23/21  4:55 AM  Result Value Ref Range   Vitamin B-12 400 180 - 914 pg/mL    Comment: (NOTE) This assay is not validated for testing neonatal or myeloproliferative syndrome specimens for Vitamin B12 levels. Performed at [facility], [address], [place], [place] [address]   TSH     Status: None   Collection Time: 09/23/21  4:55 AM  Result Value Ref Range   TSH 2.338 0.350 - 4.500 uIU/mL    Comment: Performed by a 3rd Generation assay with a functional sensitivity of <=0.01 uIU/mL. Performed at [facility], [address], [place], [place] [address]   CBG monitoring, ED     Status: None   Collection Time: 09/23/21  7:35 AM  Result Value Ref Range   Glucose-Capillary 95 70 - 99 mg/dL    Comment: Glucose reference range applies only to samples taken after fasting for at least 8 hours.   Comment 1 Notify RN    Comment 2 Document in Chart   Urine Culture     Status: Abnormal   Collection Time: 09/23/21  8:19 AM    Specimen: Urine, Clean Catch  Result Value Ref Range   Specimen Description URINE, CLEAN CATCH    Special Requests      NONE Performed at [facility], [address], [place], [place] [address]    Culture MULTIPLE SPECIES PRESENT, SUGGEST RECOLLECTION (A)    Report Status 09/24/2021 FINAL   Ammonia     Status: None   Collection Time: 09/23/21  8:54 AM  Result Value Ref Range   Ammonia 14 9 -  35 umol/L    Comment: Performed at [facility], [address] [place], [place] [address]  TSH     Status: None   Collection Time: 09/23/21  8:54 AM  Result Value Ref Range   TSH 2.183 0.350 - 4.500 uIU/mL    Comment: Performed by a 3rd Generation assay with a functional sensitivity of <=0.01 uIU/mL. Performed at [facility], [address], [place], [place] [address]   Glucose, capillary     Status: Abnormal   Collection Time: 09/23/21 12:02 PM  Result Value Ref Range   Glucose-Capillary 132 (H) 70 - 99 mg/dL    Comment: Glucose reference range applies only to samples taken after fasting for at least 8 hours.   Comment 1 Notify RN    Comment 2 Document in Chart   Glucose, capillary     Status: Abnormal   Collection Time: 09/23/21  7:39 PM  Result Value Ref Range   Glucose-Capillary 135 (H) 70 - 99 mg/dL    Comment: Glucose reference range applies only to samples taken after fasting for at least 8 hours.   Comment 1 Notify RN    Comment 2 Document in Chart   Glucose, capillary     Status: Abnormal   Collection Time: 09/23/21 11:30 PM  Result Value Ref Range   Glucose-Capillary 136 (H) 70 - 99 mg/dL    Comment: Glucose reference range applies only to samples taken after fasting for at least 8 hours.   Comment 1 Notify RN    Comment 2 Document in Chart   RPR     Status: None   Collection Time: 09/24/21  2:42 AM  Result Value Ref Range   RPR Ser Ql NON REACTIVE NON REACTIVE    Comment: Performed at [facility], [address], [place], [place] [address]   TSH     Status: None   Collection Time: 09/24/21  2:42 AM  Result Value Ref Range   TSH 1.604 0.350 - 4.500 uIU/mL    Comment: Performed by a 3rd Generation assay with a functional sensitivity of <=0.01 uIU/mL. Performed at [facility], [address] [place], [place] [address]   Magnesium     Status: None   Collection Time: 09/24/21  2:42 AM  Result Value Ref Range   Magnesium 1.7 1.7 - 2.4 mg/dL    Comment: Performed at [facility], [address] [place], [place] [address]  Phosphorus     Status: None   Collection Time: 09/24/21  2:42 AM  Result Value Ref Range   Phosphorus 2.9 2.5 - 4.6 mg/dL    Comment: Performed at [facility], [address], [place], [place] [address]  Basic metabolic panel     Status: Abnormal   Collection Time: 09/24/21  2:42 AM  Result Value Ref Range   Sodium 138 135 - 145 mmol/L   Potassium 3.8 3.5 - 5.1 mmol/L   Chloride 107 98 - 111 mmol/L   CO2 24 22 - 32 mmol/L   Glucose, Bld 102 (H) 70 - 99 mg/dL    Comment: Glucose reference range applies only to samples taken after fasting for at least 8 hours.   BUN 13 8 - 23 mg/dL   Creatinine, Ser 0.76 (H) 0.44 - 1.00 mg/dL   Calcium 9.1 8.9 - 22.6 mg/dL   GFR, Estimated 49 (L) >60 mL/min    Comment: (NOTE) Calculated using the CKD-EPI Creatinine  Equation (2021)    Anion gap 7 5 - 15    Comment: Performed at [facility], [address], [place], [place] [address]  Glucose, capillary     Status: Abnormal   Collection Time: 09/24/21  4:29 AM  Result Value Ref Range   Glucose-Capillary 111 (H) 70 - 99 mg/dL    Comment: Glucose reference range applies only to samples taken after fasting for at least 8 hours.  Glucose, capillary     Status: Abnormal   Collection Time: 09/24/21 11:46 AM  Result Value Ref Range   Glucose-Capillary 116 (H) 70 - 99 mg/dL    Comment: Glucose reference range applies only to samples taken after fasting for at least 8 hours.   Comment 1 Notify RN      Recent Results (from the past 240 hour(s))  Urine Culture     Status: None   Collection Time: 09/22/21  5:16 PM   Specimen: Urine, Clean Catch  Result Value Ref Range Status   Specimen Description   Final    URINE, CLEAN CATCH Performed at [facility], [address], [place], [place] [address]    Special Requests   Final    NONE Performed at [facility], [address], [place], [place] [address]    Culture   Final    NO GROWTH Performed at [facility], [address] [place], [place] [address]    Report Status 09/24/2021 FINAL  Final  Urine Culture     Status: Abnormal   Collection Time: 09/23/21  8:19 AM   Specimen: Urine, Clean Catch  Result Value Ref Range Status   Specimen Description URINE, CLEAN CATCH  Final   Special Requests   Final    NONE Performed at [facility], [address], [place], [place] [address]    Culture MULTIPLE SPECIES PRESENT, SUGGEST RECOLLECTION (A)  Final   Report Status 09/24/2021 FINAL  Final    Lipid Panel Recent Labs    09/23/21 0455  CHOL 136  TRIG 64  HDL 59  CHOLHDL 2.3  VLDL 13  LDLCALC 64    Studies/Results: EEG adult  Result Date: 09/23/2021 Charlsie Quest, MD     09/23/2021  6:58 PM Patient Name: KAELYN INNOCENT MRN: [id number] Epilepsy Attending: Charlsie Quest Referring Physician/Provider: Marjorie Smolder, NP Date: 09/23/2021 Duration: 22.23 mins Patient history: 76yo F with ams. EEG to evaluate for seizure Level of alertness: Awake AEDs during EEG study: None Technical aspects: This EEG study was done with scalp electrodes positioned according to the 10-20 International system of electrode placement. Electrical activity was reviewed with band pass filter of 1-70Hz , sensitivity of 7 uV/mm, display speed of 22mm/sec with a 60Hz  notched filter applied as appropriate. EEG data were recorded continuously and digitally stored.  Video monitoring was available and  reviewed as appropriate. Description: No posterior dominant rhythm was seen. EEG showed continuous generalized 3 to 6 Hz theta-delta slowing.  Hyperventilation and photic stimulation were not performed.     ABNORMALITY - Continuous slow, generalized  IMPRESSION: This study is suggestive of moderate diffuse encephalopathy, nonspecific etiology. No seizures or epileptiform discharges were seen throughout the recording.   Priyanka    CT ANGIO HEAD W OR WO CONTRAST  Result Date: 09/23/2021 CLINICAL DATA:  Follow-up examination for stroke. EXAM: CT ANGIOGRAPHY HEAD AND NECK TECHNIQUE: Multidetector CT imaging of the head and neck was  performed using the standard protocol during bolus administration of intravenous contrast. Multiplanar CT image reconstructions and MIPs were obtained to evaluate the vascular anatomy. Carotid stenosis measurements (when applicable) are obtained utilizing NASCET criteria, using the distal internal carotid diameter as the denominator. RADIATION DOSE REDUCTION: This exam was performed according to the departmental dose-optimization program which includes automated exposure control, adjustment of the mA and/or kV according to patient size and/or use of iterative reconstruction technique. CONTRAST:  65mL OMNIPAQUE IOHEXOL 350 MG/ML SOLN COMPARISON:  Prior studies from 09/22/2021. FINDINGS: CTA NECK FINDINGS Aortic arch: Examination degraded by motion artifact. Visualized aortic arch normal caliber with standard 3 vessel morphology. Mild atheromatous change about the arch itself. No stenosis about the origin the great vessels. Right carotid system: Right common and internal carotid arteries are tortuous but widely patent without stenosis or dissection. Left carotid system: Left common and internal carotid arteries are tortuous but widely patent without stenosis or dissection. Vertebral arteries: Both vertebral arteries arise from subclavian arteries. No proximal subclavian artery  stenosis. Vertebral arteries mildly tortuous bilaterally. Evaluation of the distal V3 segments somewhat limited by motion. Visualized vertebral arteries patent without stenosis or dissection. Skeleton: No discrete or worrisome osseous lesions. Moderate spondylosis present at C4-5 through C6-7. Degenerative changes noted about the left TMJ. Other neck: No other acute soft tissue abnormality within the neck. Upper chest: Visualized upper chest demonstrates no acute finding. Review of the MIP images confirms the above findings CTA HEAD FINDINGS Anterior circulation: Evaluation of the intracranial circulation limited by motion artifact and patient positioning. Petrous segments patent bilaterally. Scattered atheromatous change within the carotid siphons without hemodynamically significant stenosis. A1 segments patent bilaterally. Right A1 hypoplastic. Normal anterior communicating artery complex. Both ACAs patent without visible stenosis. Left ACA dominant. No M1 stenosis or occlusion. No proximal MCA branch occlusion. Distal MCA branches perfused and symmetric. Posterior circulation: Both vertebral arteries patent without stenosis. Both PICA patent. Basilar patent to its distal aspect without stenosis. Superior cerebral arteries patent bilaterally. Both PCAs primarily supplied via the basilar well perfused or distal aspects. Venous sinuses: Grossly patent allowing for timing the contrast bolus and motion. Anatomic variants: None significant.  No aneurysm. Review of the MIP images confirms the above findings IMPRESSION: 1. Negative CTA for large vessel occlusion or other emergent finding. 2. Mild atheromatous change about the carotid siphons without hemodynamically significant or correctable stenosis. 3. Diffuse tortuosity about the major arterial vasculature of the head and neck, suggesting chronic underlying hypertension. Electronically Signed   By: Rise Mu M.D.   On: 09/23/2021 03:54   CT ANGIO NECK W  OR WO CONTRAST  Result Date: 09/23/2021 CLINICAL DATA:  Follow-up examination for stroke. EXAM: CT ANGIOGRAPHY HEAD AND NECK TECHNIQUE: Multidetector CT imaging of the head and neck was performed using the standard protocol during bolus administration of intravenous contrast. Multiplanar CT image reconstructions and MIPs were obtained to evaluate the vascular anatomy. Carotid stenosis measurements (when applicable) are obtained utilizing NASCET criteria, using the distal internal carotid diameter as the denominator. RADIATION DOSE REDUCTION: This exam was performed according to the departmental dose-optimization program which includes automated exposure control, adjustment of the mA and/or kV according to patient size and/or use of iterative reconstruction technique. CONTRAST:  65mL OMNIPAQUE IOHEXOL 350 MG/ML SOLN COMPARISON:  Prior studies from 09/22/2021. FINDINGS: CTA NECK FINDINGS Aortic arch: Examination degraded by motion artifact. Visualized aortic arch normal caliber with standard 3 vessel morphology. Mild atheromatous change about the arch itself. No stenosis about  the origin the great vessels. Right carotid system: Right common and internal carotid arteries are tortuous but widely patent without stenosis or dissection. Left carotid system: Left common and internal carotid arteries are tortuous but widely patent without stenosis or dissection. Vertebral arteries: Both vertebral arteries arise from subclavian arteries. No proximal subclavian artery stenosis. Vertebral arteries mildly tortuous bilaterally. Evaluation of the distal V3 segments somewhat limited by motion. Visualized vertebral arteries patent without stenosis or dissection. Skeleton: No discrete or worrisome osseous lesions. Moderate spondylosis present at C4-5 through C6-7. Degenerative changes noted about the left TMJ. Other neck: No other acute soft tissue abnormality within the neck. Upper chest: Visualized upper chest demonstrates no  acute finding. Review of the MIP images confirms the above findings CTA HEAD FINDINGS Anterior circulation: Evaluation of the intracranial circulation limited by motion artifact and patient positioning. Petrous segments patent bilaterally. Scattered atheromatous change within the carotid siphons without hemodynamically significant stenosis. A1 segments patent bilaterally. Right A1 hypoplastic. Normal anterior communicating artery complex. Both ACAs patent without visible stenosis. Left ACA dominant. No M1 stenosis or occlusion. No proximal MCA branch occlusion. Distal MCA branches perfused and symmetric. Posterior circulation: Both vertebral arteries patent without stenosis. Both PICA patent. Basilar patent to its distal aspect without stenosis. Superior cerebral arteries patent bilaterally. Both PCAs primarily supplied via the basilar well perfused or distal aspects. Venous sinuses: Grossly patent allowing for timing the contrast bolus and motion. Anatomic variants: None significant.  No aneurysm. Review of the MIP images confirms the above findings IMPRESSION: 1. Negative CTA for large vessel occlusion or other emergent finding. 2. Mild atheromatous change about the carotid siphons without hemodynamically significant or correctable stenosis. 3. Diffuse tortuosity about the major arterial vasculature of the head and neck, suggesting chronic underlying hypertension. Electronically Signed   By: Rise Mu M.D.   On: 09/23/2021 03:54   MR BRAIN WO CONTRAST  Result Date: 09/22/2021 CLINICAL DATA:  Initial evaluation for neuro deficit, stroke suspected. EXAM: MRI HEAD WITHOUT CONTRAST TECHNIQUE: Multiplanar, multiecho pulse sequences of the brain and surrounding structures were obtained without intravenous contrast. COMPARISON:  Prior CT from earlier the same day. FINDINGS: Brain: Examination degraded by motion artifact. Mild age-related cerebral atrophy. Patchy T2/FLAIR hyperintensity involving the  periventricular deep white matter both cerebral hemispheres, consistent with chronic small vessel ischemic disease, mild for age. Punctate 4 mm focus of diffusion signal abnormality involving the deep white matter of the right frontal centrum semi ovale, consistent with a small acute to early subacute ischemic infarct (series 12, image 41). No other convincing evidence for acute or subacute ischemia. Gray-white matter differentiation otherwise maintained. No visible areas of chronic cortical infarction. No visible acute or chronic intracranial blood products. No mass lesion, midline shift or mass effect. No hydrocephalus or extra-axial fluid collection. Pituitary gland and suprasellar region within normal limits. Vascular: Major intracranial vascular flow voids are maintained. Skull and upper cervical spine: Craniocervical junction within normal limits. Bone marrow signal intensity grossly normal. No scalp soft tissue abnormality. Sinuses/Orbits: Prior bilateral ocular lens replacement. Paranasal sinuses are largely clear. No significant mastoid effusion. Other: None. IMPRESSION: 1. 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 No associated hemorrhage. 2. No other acute intracranial abnormality. 3. 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 Electronically Signed   By: Rise Mu M.D.   On: 09/22/2021 23:02  Medications:  No current facility-administered medications on file prior to encounter.   Current Outpatient Medications on File Prior to Encounter  Medication Sig Dispense Refill   acetaminophen (TYLENOL) 650 MG CR tablet Take 1,300 mg by mouth in the morning and at bedtime.     alendronate (FOSAMAX) 70 MG tablet Take 1 tablet by mouth every Thursday.     aspirin EC 81 MG tablet Take 81 mg by mouth daily. Swallow whole.      atorvastatin (LIPITOR) 10 MG tablet Take 10 mg by mouth at bedtime.      buPROPion (WELLBUTRIN XL) 300 MG 24 hr tablet Take 300 mg by mouth daily after breakfast.     calcium carbonate (OS-CAL) 600 MG TABS tablet Take 600 mg by mouth 2 (two) times daily with a meal.     cholecalciferol (VITAMIN D3) 25 MCG (1000 UNIT) tablet Take 1,000 Units by mouth in the morning and at bedtime.     cyclobenzaprine (FLEXERIL) 10 MG tablet Take 10 mg by mouth at bedtime.     DULoxetine (CYMBALTA) 30 MG capsule Take 90 mg by mouth daily after breakfast.     famotidine (PEPCID) 20 MG tablet Take 20 mg by mouth daily.     gabapentin (NEURONTIN) 300 MG capsule Take 300 mg by mouth 3 (three) times daily.     Lurasidone HCl 60 MG TABS Take 60 mg by mouth daily with supper. [name]     metFORMIN (GLUCOPHAGE) 500 MG tablet Take 500 mg by mouth 2 (two) times daily.  3   traMADol (ULTRAM) 50 MG tablet Take 50 mg by mouth 2 (two) times daily.     vitamin B-12 (CYANOCOBALAMIN) 1000 MCG tablet Take 1,000 mcg by mouth daily.     metoprolol succinate (TOPROL XL) 25 MG 24 hr tablet Take 1 tablet (25 mg total) by mouth in the morning. 90 tablet 3    Inpatient Scheduled:  aspirin EC  81 mg Oral Daily   atorvastatin  10 mg Oral QHS   buPROPion  300 mg Oral QPC breakfast   clopidogrel  75 mg Oral Daily   DULoxetine  90 mg Oral QPC breakfast   enoxaparin (LOVENOX) injection  40 mg Subcutaneous Q24H   gabapentin  300 mg Oral TID   lidocaine  1 patch Transdermal Q24H   lurasidone  60 mg Oral Q supper   metoprolol succinate  25 mg Oral Daily   sodium chloride flush  3 mL Intravenous Q12H   Continuous:  dextrose 5% lactated ringers 75 mL/hr at 09/24/21 0035   Assessment: 76 year old female presenting with new onset hallucinations and confusion with recent decline in health since covid-19 infection in June.  UA negative for UTI and MRI likely does not show stroke.  WBC is normal and patient is afebrile. Patient is unable to  provide a good history, and exam is consistent with memory impairment caused by dementia vs. delirium.  Patient is aware that she has had hallucinations but denies any at this time.  She is aware that she has been having falls frequently and will need PT evaluation for safety.  # AMS with hallucinations - Additional history obtained from family today:  - Her sister notes that she had been having severe facial tardive dyskinesia symptoms that had begun after her apparent Covid infection in June. Sister states that shortly into this hospitalization, the facial dyskinesia symptoms completely subsided. Sister was wondering if we took her off her psych meds as the  reason for the resolution of the facial TD and on review of her psychoactive meds at home and here with her sister, I clarified that they have not changed. Ultram was stopped, but this medication is not linked to TD. Of note, she is on 3 potentially offending medications regarding TD: Lurasidone, bupropion and duloxetine.  - Family states that she was diagnosed with covid based on a positive test after returning from her ocean cruise in June. When asked further questions regarding this, family does clarify that she never had any systemic symptoms of covid including no cough or fever. What they do describe is social withdrawal and hostile behavior during the ocean cruise from which she came back covid+. Given her past psychiatric history, I am wondering if she had a major depressive episode on the cruise as the explanation for her symptoms there, followed by psychotic break manifesting with the hallucinations that precipitated this admission. - Cognitive exam yesterday revealed cognitive impairments in 3 domains: Memory, concentration and speech. Her hallucinations may be due to an incipient dementing process such as Lewy body dementia or Alzheimer's dementia - EEG: Continuous slow, generalized. This study is suggestive of moderate diffuse encephalopathy,  nonspecific to etiology. No seizures or epileptiform discharges were seen throughout the recording. - Vitamin B12 level is normal. RPR nonreactive. TSH normal.   # Punctate signal change in right frontal white matter on MRI - MRI examination of the brain read by Radiology as being most consistent with a small right frontal lobe stroke. However, on personal review of the images, the 39mm spot seen in the deep white matter of the right frontal centrum ovale on DWI most likely represents T2 shine-through artifact rather than a stroke.  Spot is bland/hazy rather than markedly hyperintense and has no hypointense correlate on ADC. - CTA of head and neck: Negative CTA for large vessel occlusion or other emergent finding. Mild atheromatous change about the carotid siphons without hemodynamically significant or correctable stenosis. Diffuse tortuosity about the major arterial vasculature of the head and neck, suggesting chronic underlying hypertension.    Impression:  - New onset hallucinations and confusion, most likely caused by an incipient dementia, possibly exacerbated by psychosocial stressors or metabolic derangements.  - Recent onset of facial tardive dyskinesia, possibly secondary to one or more of her psychoactive medications: Lurasidone, duloxetine and bupropion   Recommendations: - Will need Psychiatry consult - Full cognitive evaluation by SLP - Thiamine level pending.  - Delirium precautions - PT/OT evaluation for safety give recent frequent falls - Avoid antipsychotic medications - Also avoid deliriogenic meds including anticholinergics such as Benadryl and sedating medications such as Valium or Ativan - May benefit from a trial of Aricept  - Outpatient follow up for dementia evaluation with [facility].   Addendum: TTE with bubble study: No intracardiac source of embolism  detected on this transthoracic study. Consider a transesophageal  echocardiogram to exclude  cardiac source of embolism if clinically indicated.     LOS: 2 days   @Electronically  signed: Dr. 09/24/2021  4:43 PM

## 2021-09-24 NOTE — Evaluation (Signed)
Occupational Therapy Evaluation Patient Details Name: Nicole Graves MRN: [id number] DOB: 1946/01/24 Today's Date: 09/24/2021   History of Present Illness 76 yo female presents to ED on 8/9 with falls, hallucinations, decreased intake. workup for AME, vs deficits from CVA. 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   Clinical Impression   PTA, per pt she was independent and lived with her daughter's family. Pt reporting she drives occasionally but her daughter thinks it is unsafe. Upon evaluation, pt performing functional transfers with min A due to posterior bias on initial stand. Up to min A for RW management. Pt with BUE tremors, affecting FM coordination during daily occupation. Pt performing UB ADL with supervision, and LB ADL with min A. Pt presenting with decreased balance, strength, coordination, safety awareness, attention, and awareness. Recommend OP OT to optimize safety and independence with ADL and IADL.      Recommendations for follow up therapy are one component of a multi-disciplinary discharge planning process, led by the attending physician.  Recommendations may be updated based on patient status, additional functional criteria and insurance authorization.   Follow Up Recommendations  Outpatient OT    Assistance Recommended at Discharge Frequent or constant Supervision/Assistance  Patient can return home with the following A little help with walking and/or transfers;A little help with bathing/dressing/bathroom;Assistance with cooking/housework;Direct supervision/assist for medications management;Direct supervision/assist for financial management;Assist for transportation;Help with stairs or ramp for entrance    Functional Status Assessment  Patient has  had a recent decline in their functional status and demonstrates the ability to make significant improvements in function in a reasonable and predictable amount of time.  Equipment Recommendations  None recommended by OT (Pt has recommended equipment)    Recommendations for Other Services       Precautions / Restrictions Precautions Precautions: Fall Restrictions Weight Bearing Restrictions: No      Mobility Bed Mobility Overal bed mobility: Needs Assistance Bed Mobility: Supine to Sit     Supine to sit: Min guard     General bed mobility comments: Min guard A for safety    Transfers Overall transfer level: Needs assistance Equipment used: Rolling walker (2 wheels) Transfers: Sit to/from Stand Sit to Stand: Min assist           General transfer comment: assist to steady/correct posterior bias      Balance Overall balance assessment: Needs assistance Sitting-balance support: Feet supported, No upper extremity supported Sitting balance-Leahy Scale: Good     Standing balance support: Bilateral upper extremity supported, During functional activity Standing balance-Leahy Scale: Fair Standing balance comment: Standing without AD at sink, occasional bracing on wink with unilateral UE                           ADL either performed or assessed with clinical judgement   ADL Overall ADL's : Needs assistance/impaired Eating/Feeding: Set up;Sitting Eating/Feeding Details (indicate cue type and reason): Pt finishing omelet on arrival, and pt with BUE tremmorss; dropping food onto gown. Grooming: Oral care;Min guard;Standing Grooming Details (indicate cue type and reason): Pt with good scanning to locate needed items, and performing with min guard A; noting decreased coordination Upper Body Bathing: Set up;Sitting   Lower Body Bathing: Minimal assistance;Sit to/from stand   Upper  Body Dressing : Set up;Sitting   Lower Body Dressing: Minimal assistance;Sit  to/from stand   Toilet Transfer: Minimal assistance;Cueing for safety;Comfort height toilet;Rolling walker (2 wheels);Ambulation Toilet Transfer Details (indicate cue type and reason): Simulated in room         Functional mobility during ADLs: Min guard;Minimal assistance;Rolling walker (2 wheels) General ADL Comments: Min guard with intermittent min A for RW use for safety and navigating small spaces     Vision Baseline Vision/History: 4 Cataracts (cataract surgery several years ago, but pt reporting vision is getting progressively worse; not new since onset of stroke like symptoms) Ability to See in Adequate Light: 0 Adequate (using phone on arrival and scanning at sink. Decreased visual attention) Patient Visual Report: Blurring of vision Vision Assessment?: Yes Eye Alignment: Within Functional Limits Tracking/Visual Pursuits: Other (comment) (Decreased visual attention) Convergence: Within functional limits Visual Fields: No apparent deficits (attempting to compensate with head turn, but appears WFL overall) Depth Perception: Undershoots (light undershooting)     Perception     Praxis      Pertinent Vitals/Pain Pain Assessment Pain Assessment: No/denies pain     Hand Dominance Right   Extremity/Trunk Assessment Upper Extremity Assessment Upper Extremity Assessment: Generalized weakness;LUE deficits/detail;RUE deficits/detail RUE Deficits / Details: Pt with BUE tremors, and decreased corodination with finger opposition (L less coordinated than R); RUE generalized wealness 4/5 shoulder flexion/push/pull/grip; decreased shoulder ROM ~140 degrees RUE Coordination: decreased fine motor;decreased gross motor LUE Deficits / Details: Pt with BUE tremors, and decreased corodination with finger opposition (L less coordinated than R); LUE generalized weakness 4/5 push/pull/grip; L shoulder decreased ROM, and pt reporting that shoulder has been receivig injections due to bone on bone  joint. decreased shoulder ROM ~140 degrees LUE Coordination: decreased fine motor;decreased gross motor   Lower Extremity Assessment Lower Extremity Assessment: Defer to PT evaluation   Cervical / Trunk Assessment Cervical / Trunk Assessment: Normal   Communication Communication Communication: No difficulties   Cognition Arousal/Alertness: Awake/alert Behavior During Therapy: WFL for tasks assessed/performed Overall Cognitive Status: Impaired/Different from baseline Area of Impairment: Attention, Memory, Following commands, Safety/judgement, Problem solving, Orientation, Awareness                 Orientation Level: Time (Oriented to date but not day of week) Current Attention Level: Sustained Memory: Decreased short-term memory Following Commands: Follows one step commands with increased time Safety/Judgement: Decreased awareness of safety, Decreased awareness of deficits Awareness: Emergent Problem Solving: Difficulty sequencing, Requires verbal cues, Requires tactile cues, Slow processing General Comments: Pt with decreased safety awareness with use of RW, requires cues for avoiding running into objects, placement in RW as pt letting RW slide laterally when distracted. following one step commands with increased time, and 2 step commands with min cues, however, pt hard of hearing. Pt recalling 2/3 words on delayed memory recall     General Comments  VSS. Pt reporting family can provide assistance at any time needed    Exercises     Shoulder Instructions      Home Living Family/patient expects to be discharged to:: Private residence Living Arrangements: Children (and grandchildren; 22 and 78 years of age) Available Help at Discharge: Family;Available 24 hours/day Type of Home: House Home Access: Stairs to enter [employer] of Steps: 6 Entrance Stairs-Rails: Can reach both Home Layout: Able to live on main level with bedroom/bathroom     Bathroom  Shower/Tub: Chief Strategy Officer: Standard     Home Equipment: Rolling  Walker (2 wheels);Cane - single point;BSC/3in1;Rollator (4 wheels)      Lives With: Family    Prior Functioning/Environment Prior Level of Function : Independent/Modified Independent             Mobility Comments: Pt reports no AD, but has cane and RW ADLs Comments: Independent, driving a little bit but not much; reporting daughter does not think she should be driving.        OT Problem List: Decreased strength;Decreased activity tolerance;Decreased range of motion;Impaired balance (sitting and/or standing);Decreased coordination;Decreased cognition;Decreased safety awareness;Decreased knowledge of use of DME or AE;Impaired UE functional use      OT Treatment/Interventions: Self-care/ADL training;Therapeutic exercise;DME and/or AE instruction;Therapeutic activities;Cognitive remediation/compensation;Patient/family education;Balance training    OT Goals(Current goals can be found in the care plan section) Acute Rehab OT Goals Patient Stated Goal: Get better OT Goal Formulation: With patient Time For Goal Achievement: 09/24/21 Potential to Achieve Goals: Good  OT Frequency: Min 2X/week    Co-evaluation              AM-PAC OT "6 Clicks" Daily Activity     Outcome Measure Help from another person eating meals?: None Help from another person taking care of personal grooming?: A Little Help from another person toileting, which includes using toliet, bedpan, or urinal?: A Little Help from another person bathing (including washing, rinsing, drying)?: A Little Help from another person to put on and taking off regular upper body clothing?: A Little Help from another person to put on and taking off regular lower body clothing?: A Little 6 Click Score: 19   End of Session Equipment Utilized During Treatment: Gait belt;Rolling walker (2 wheels) Nurse Communication: Mobility status;Other  (comment) (Pt reporting BUE tremors are more noticable today)  Activity Tolerance: Patient tolerated treatment well Patient left: in chair;with call bell/phone within reach;with nursing/sitter in room;with chair alarm set  OT Visit Diagnosis: Unsteadiness on feet (R26.81);Muscle weakness (generalized) (M62.81);History of falling (Z91.81);Other symptoms and signs involving cognitive function                Time: 3007-6226 OT Time Calculation (min): 28 min Charges:  OT General Charges $OT Visit: 1 Visit OT Evaluation $OT Eval Moderate Complexity: 1 Mod OT Treatments $Self Care/Home Management : 8-22 mins  Ladene Artist, OTR/L [facility] Acute Rehabilitation Office: [phone]   Drue Novel 09/24/2021, 9:30 AM

## 2021-09-24 NOTE — Care Management Important Message (Signed)
Important Message  Patient Details  Name: TANGANIKA BARRADAS MRN: [id number] Date of Birth: 08/20/1945   Medicare Important Message Given:  Yes     Sherilyn Banker 09/24/2021, 3:06 PM

## 2021-09-24 NOTE — Progress Notes (Signed)
PROGRESS NOTE    Nicole Graves  AGT:[id number] DOB: [birth year] DOA: 09/22/2021 PCP: Kathyrn Lass, MD    Brief Narrative:  76 year old with past medical history significant for severe aortic stenosis status post TAVR 02/2021, CKD 3B, paroxysmal atrial tachycardia, major depressive disorder, presented for evaluation of hallucination and confusion.  Per family since patient was diagnosed with COVID-19 in late June she has exhibited a gradual decline.  Progressive worsening weakness and decreased appetite.  Over the last several days she has exhibited episode of anxiety with hallucination. Evaluation in the ED MRI was obtained revealing right frontal centrum semiovale acute to subacute infarct that was thought to be an artifact.  Patient was admitted with further neurological work-up.   Assessment & Plan:   Acute metabolic encephalopathy/hallucinations: Primary cause unknown. MRI of the brain with 4 mm focus right frontal centrum semiovale, this was thought to be artifact without any clinical significance.  Acute stroke ruled out. Ammonia, 14 TSH, 1.6 RPR, nonreactive B12, 400 Urinalysis without concern for infection. Mental status is improving.  Physically debilitated.  Will need ongoing rehab. Family reported seizure-like episodes, however EEG is without any epileptogenic focus. Neurology recommended outpatient follow-up for dementia work-up.  Avoid benzodiazepines.  Major depressive disorder: Patient is on multiple antidepressants including Cymbalta, Wellbutrin as well as [name].  Followed by outpatient psychiatry.  Less likely causing symptoms.  Continued.  Paroxysmal atrial tachycardia: Stable on metoprolol.  Hypomagnesemia/hypokalemia: Replaced aggressively.  Improved levels.  CKD stage IIIb: At about baseline.  Patient is not diabetic.  She is with poor appetite.  A1c is 4.6.  Takes metformin for weight loss.  Allow regular diet with supplementation.  Improve mobility.  Monitor.   Anticipate home with home health PT OT.  Will need supervision at home.   DVT prophylaxis: enoxaparin (LOVENOX) injection 40 mg Start: 09/23/21 1000   Code Status: Full code Family Communication: Sister at the bedside Disposition Plan: Status is: Inpatient Remains inpatient appropriate because: Debility, altered mental status work-up continued.     Consultants:  Neurology  Procedures:  None  Antimicrobials:  None   Subjective: Patient seen and examined.  Early morning examination she was without any complaints.  She does feel some tremors and weakness but no more hallucinations or delusions. Met with patient with sister at the bedside, patient is doing fairly well she has some trouble walking. Sister was very concerned about patient going home without knowing the diagnosis, she tells me that patient is by herself in the daytime when everybody else goes to work so she cannot go home from the hospital.  Will involve case management to arrange more help at home.  Objective: Vitals:   09/23/21 2330 09/24/21 0431 09/24/21 0732 09/24/21 1144  BP: 138/83 (!) 150/97 (!) 150/90 131/77  Pulse: 93 92 93 88  Resp: '17 20 18 19  ' Temp: 98 F (36.7 C) 97.7 F (36.5 C) 98.3 F (36.8 C) 98.9 F (37.2 C)  TempSrc:  Oral Oral Oral  SpO2: 99% 97% 99% 99%  Weight:      Height:        Intake/Output Summary (Last 24 hours) at 09/24/2021 1457 Last data filed at 09/23/2021 2105 Gross per 24 hour  Intake 100 ml  Output --  Net 100 ml   Filed Weights   09/22/21 1823  Weight: 83 kg    Examination:  General exam: Appears calm and comfortable, frail.  Not in any distress. Alert oriented today.  Without any neurological deficits today.  Very pleasant to conversation. Respiratory system: No added sounds. Cardiovascular system: S1 & S2 heard, RRR. No pedal edema. Gastrointestinal system: Abdomen is nondistended, soft and nontender. No organomegaly or masses felt. Normal bowel sounds  heard. Central nervous system: Alert and oriented. No focal neurological deficits. Extremities: Symmetric 5 x 5 power.    Data Reviewed: I have personally reviewed following labs and imaging studies  CBC: Recent Labs  Lab 09/22/21 1509 09/23/21 0455  WBC 5.9 5.3  NEUTROABS 3.4 2.6  HGB 10.9* 9.7*  HCT 33.9* 29.8*  MCV 96.0 94.3  PLT 238 185   Basic Metabolic Panel: Recent Labs  Lab 09/22/21 1509 09/23/21 0455 09/24/21 0242  NA 139 139 138  K 3.5 3.4* 3.8  CL 105 108 107  CO2 '26 25 24  ' GLUCOSE 89 90 102*  BUN 28* 19 13  CREATININE 1.35* 1.18* 1.15*  CALCIUM 10.3 9.1 9.1  MG  --  1.3* 1.7  PHOS  --   --  2.9   GFR: Estimated Creatinine Clearance: 42 mL/min (A) (by C-G formula based on SCr of 1.15 mg/dL (H)). Liver Function Tests: Recent Labs  Lab 09/22/21 1509 09/23/21 0455  AST 14* 12*  ALT 9 8  ALKPHOS 48 40  BILITOT 0.5 0.5  PROT 6.4* 5.3*  ALBUMIN 3.6 2.9*   No results for input(s): "LIPASE", "AMYLASE" in the last 168 hours. Recent Labs  Lab 09/23/21 0854  AMMONIA 14   Coagulation Profile: No results for input(s): "INR", "PROTIME" in the last 168 hours. Cardiac Enzymes: No results for input(s): "CKTOTAL", "CKMB", "CKMBINDEX", "TROPONINI" in the last 168 hours. BNP (last 3 results) No results for input(s): "PROBNP" in the last 8760 hours. HbA1C: Recent Labs    09/23/21 0455  HGBA1C 4.9   CBG: Recent Labs  Lab 09/23/21 1202 09/23/21 1939 09/23/21 2330 09/24/21 0429 09/24/21 1146  GLUCAP 132* 135* 136* 111* 116*   Lipid Profile: Recent Labs    09/23/21 0455  CHOL 136  HDL 59  LDLCALC 64  TRIG 64  CHOLHDL 2.3   Thyroid Function Tests: Recent Labs    09/24/21 0242  TSH 1.604   Anemia Panel: Recent Labs    09/23/21 0455  VITAMINB12 400  FOLATE 10.3  TIBC 294  IRON 52   Sepsis Labs: No results for input(s): "PROCALCITON", "LATICACIDVEN" in the last 168 hours.  Recent Results (from the past 240 hour(s))  Urine  Culture     Status: None   Collection Time: 09/22/21  5:16 PM   Specimen: Urine, Clean Catch  Result Value Ref Range Status   Specimen Description   Final    URINE, CLEAN CATCH Performed at [facility], [place] [address], [place], [place] [address]    Special Requests   Final    NONE Performed at [facility], [place] [address] [place], [place] [address]    Culture   Final    NO GROWTH Performed at [facility], [place] [address], [place], [place] [address]    Report Status 09/24/2021 FINAL  Final  Urine Culture     Status: Abnormal   Collection Time: 09/23/21  8:19 AM   Specimen: Urine, Clean Catch  Result Value Ref Range Status   Specimen Description URINE, CLEAN CATCH  Final   Special Requests   Final    NONE Performed at [facility], [place] [address] [place], [place] [address]    Culture MULTIPLE SPECIES PRESENT, SUGGEST RECOLLECTION (  A)  Final   Report Status 09/24/2021 FINAL  Final         Radiology Studies: EEG adult  Result Date: 10-07-2021 Lora Havens, MD     10/07/21  6:58 PM Patient Name: MIAROSE LIPPERT MRN: [id number] Epilepsy Attending: Lora Havens Referring Physician/Provider: Katy Apo, NP Date: 10-07-2021 Duration: 22.23 mins Patient history: 76yo F with ams. EEG to evaluate for seizure Level of alertness: Awake AEDs during EEG study: None Technical aspects: This EEG study was done with scalp electrodes positioned according to the 10-20 International system of electrode placement. Electrical activity was reviewed with band pass filter of 1-'70Hz' , sensitivity of 7 uV/mm, display speed of 72m/sec with a '60Hz'  notched filter applied as appropriate. EEG data were recorded continuously and digitally stored.  Video monitoring was available and reviewed as appropriate. Description: No posterior dominant rhythm was seen. EEG showed continuous generalized 3 to 6 Hz theta-delta slowing.  Hyperventilation and  photic stimulation were not performed.     ABNORMALITY - Continuous slow, generalized  IMPRESSION: This study is suggestive of moderate diffuse encephalopathy, nonspecific etiology. No seizures or epileptiform discharges were seen throughout the recording.   Priyanka OBarbra Sarks   CT ANGIO HEAD W OR WO CONTRAST  Result Date: 82023/08/24CLINICAL DATA:  Follow-up examination for stroke. EXAM: CT ANGIOGRAPHY HEAD AND NECK TECHNIQUE: Multidetector CT imaging of the head and neck was performed using the standard protocol during bolus administration of intravenous contrast. Multiplanar CT image reconstructions and MIPs were obtained to evaluate the vascular anatomy. Carotid stenosis measurements (when applicable) are obtained utilizing NASCET criteria, using the distal internal carotid diameter as the denominator. RADIATION DOSE REDUCTION: This exam was performed according to the departmental dose-optimization program which includes automated exposure control, adjustment of the mA and/or kV according to patient size and/or use of iterative reconstruction technique. CONTRAST:  638mOMNIPAQUE IOHEXOL 350 MG/ML SOLN COMPARISON:  Prior studies from 09/22/2021. FINDINGS: CTA NECK FINDINGS Aortic arch: Examination degraded by motion artifact. Visualized aortic arch normal caliber with standard 3 vessel morphology. Mild atheromatous change about the arch itself. No stenosis about the origin the great vessels. Right carotid system: Right common and internal carotid arteries are tortuous but widely patent without stenosis or dissection. Left carotid system: Left common and internal carotid arteries are tortuous but widely patent without stenosis or dissection. Vertebral arteries: Both vertebral arteries arise from subclavian arteries. No proximal subclavian artery stenosis. Vertebral arteries mildly tortuous bilaterally. Evaluation of the distal V3 segments somewhat limited by motion. Visualized vertebral arteries patent without  stenosis or dissection. Skeleton: No discrete or worrisome osseous lesions. Moderate spondylosis present at C4-5 through C6-7. Degenerative changes noted about the left TMJ. Other neck: No other acute soft tissue abnormality within the neck. Upper chest: Visualized upper chest demonstrates no acute finding. Review of the MIP images confirms the above findings CTA HEAD FINDINGS Anterior circulation: Evaluation of the intracranial circulation limited by motion artifact and patient positioning. Petrous segments patent bilaterally. Scattered atheromatous change within the carotid siphons without hemodynamically significant stenosis. A1 segments patent bilaterally. Right A1 hypoplastic. Normal anterior communicating artery complex. Both ACAs patent without visible stenosis. Left ACA dominant. No M1 stenosis or occlusion. No proximal MCA branch occlusion. Distal MCA branches perfused and symmetric. Posterior circulation: Both vertebral arteries patent without stenosis. Both PICA patent. Basilar patent to its distal aspect without stenosis. Superior cerebral arteries patent bilaterally. Both PCAs primarily supplied via the basilar well perfused or  distal aspects. Venous sinuses: Grossly patent allowing for timing the contrast bolus and motion. Anatomic variants: None significant.  No aneurysm. Review of the MIP images confirms the above findings IMPRESSION: 1. Negative CTA for large vessel occlusion or other emergent finding. 2. Mild atheromatous change about the carotid siphons without hemodynamically significant or correctable stenosis. 3. Diffuse tortuosity about the major arterial vasculature of the head and neck, suggesting chronic underlying hypertension. Electronically Signed   By: Jeannine Boga M.D.   On: 09/23/2021 03:54   CT ANGIO NECK W OR WO CONTRAST  Result Date: 09/23/2021 CLINICAL DATA:  Follow-up examination for stroke. EXAM: CT ANGIOGRAPHY HEAD AND NECK TECHNIQUE: Multidetector CT imaging of the  head and neck was performed using the standard protocol during bolus administration of intravenous contrast. Multiplanar CT image reconstructions and MIPs were obtained to evaluate the vascular anatomy. Carotid stenosis measurements (when applicable) are obtained utilizing NASCET criteria, using the distal internal carotid diameter as the denominator. RADIATION DOSE REDUCTION: This exam was performed according to the departmental dose-optimization program which includes automated exposure control, adjustment of the mA and/or kV according to patient size and/or use of iterative reconstruction technique. CONTRAST:  35m OMNIPAQUE IOHEXOL 350 MG/ML SOLN COMPARISON:  Prior studies from 09/22/2021. FINDINGS: CTA NECK FINDINGS Aortic arch: Examination degraded by motion artifact. Visualized aortic arch normal caliber with standard 3 vessel morphology. Mild atheromatous change about the arch itself. No stenosis about the origin the great vessels. Right carotid system: Right common and internal carotid arteries are tortuous but widely patent without stenosis or dissection. Left carotid system: Left common and internal carotid arteries are tortuous but widely patent without stenosis or dissection. Vertebral arteries: Both vertebral arteries arise from subclavian arteries. No proximal subclavian artery stenosis. Vertebral arteries mildly tortuous bilaterally. Evaluation of the distal V3 segments somewhat limited by motion. Visualized vertebral arteries patent without stenosis or dissection. Skeleton: No discrete or worrisome osseous lesions. Moderate spondylosis present at C4-5 through C6-7. Degenerative changes noted about the left TMJ. Other neck: No other acute soft tissue abnormality within the neck. Upper chest: Visualized upper chest demonstrates no acute finding. Review of the MIP images confirms the above findings CTA HEAD FINDINGS Anterior circulation: Evaluation of the intracranial circulation limited by motion  artifact and patient positioning. Petrous segments patent bilaterally. Scattered atheromatous change within the carotid siphons without hemodynamically significant stenosis. A1 segments patent bilaterally. Right A1 hypoplastic. Normal anterior communicating artery complex. Both ACAs patent without visible stenosis. Left ACA dominant. No M1 stenosis or occlusion. No proximal MCA branch occlusion. Distal MCA branches perfused and symmetric. Posterior circulation: Both vertebral arteries patent without stenosis. Both PICA patent. Basilar patent to its distal aspect without stenosis. Superior cerebral arteries patent bilaterally. Both PCAs primarily supplied via the basilar well perfused or distal aspects. Venous sinuses: Grossly patent allowing for timing the contrast bolus and motion. Anatomic variants: None significant.  No aneurysm. Review of the MIP images confirms the above findings IMPRESSION: 1. Negative CTA for large vessel occlusion or other emergent finding. 2. Mild atheromatous change about the carotid siphons without hemodynamically significant or correctable stenosis. 3. Diffuse tortuosity about the major arterial vasculature of the head and neck, suggesting chronic underlying hypertension. Electronically Signed   By: BJeannine BogaM.D.   On: 09/23/2021 03:54   MR BRAIN WO CONTRAST  Result Date: 09/22/2021 CLINICAL DATA:  Initial evaluation for neuro deficit, stroke suspected. EXAM: MRI HEAD WITHOUT CONTRAST TECHNIQUE: Multiplanar, multiecho pulse sequences of the brain and surrounding  structures were obtained without intravenous contrast. COMPARISON:  Prior CT from earlier the same day. FINDINGS: Brain: Examination degraded by motion artifact. Mild age-related cerebral atrophy. Patchy T2/FLAIR hyperintensity involving the periventricular deep white matter both cerebral hemispheres, consistent with chronic small vessel ischemic disease, mild for age. Punctate 4 mm focus of diffusion signal  abnormality involving the deep white matter of the right frontal centrum semi ovale, consistent with a small acute to early subacute ischemic infarct (series 12, image 41). No other convincing evidence for acute or subacute ischemia. Gray-white matter differentiation otherwise maintained. No visible areas of chronic cortical infarction. No visible acute or chronic intracranial blood products. No mass lesion, midline shift or mass effect. No hydrocephalus or extra-axial fluid collection. Pituitary gland and suprasellar region within normal limits. Vascular: Major intracranial vascular flow voids are maintained. Skull and upper cervical spine: Craniocervical junction within normal limits. Bone marrow signal intensity grossly normal. No scalp soft tissue abnormality. Sinuses/Orbits: Prior bilateral ocular lens replacement. Paranasal sinuses are largely clear. No significant mastoid effusion. Other: None. IMPRESSION: 1. 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 No associated hemorrhage. 2. No other acute intracranial abnormality. 3. 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 Electronically Signed   By: Jeannine Boga M.D.   On: 09/22/2021 23:02   CT Head Wo Contrast  Result Date: 09/22/2021 CLINICAL DATA:  Mental status change unknown cause. EXAM: CT HEAD WITHOUT CONTRAST TECHNIQUE: Contiguous axial images were obtained from the base of the skull through the vertex without intravenous contrast. RADIATION DOSE REDUCTION: This exam was performed according to the departmental dose-optimization program which includes automated exposure control, adjustment of the mA and/or kV according to patient size and/or use of iterative reconstruction technique. COMPARISON:  None Available. FINDINGS: Brain: Mild atrophy. Negative for hydrocephalus. Negative for  acute infarct, hemorrhage, mass. Mild white matter hypodensity consistent with small vessel ischemia Vascular: Negative for hyperdense vessel Skull: Negative Sinuses/Orbits: Paranasal sinuses clear. Bilateral cataract extraction Other: None. IMPRESSION: Mild atrophy and mild chronic microvascular ischemia. No acute intracranial abnormality. Electronically Signed   By: Franchot Gallo M.D.   On: 09/22/2021 16:18   DG Chest 2 View  Result Date: 09/22/2021 CLINICAL DATA:  Altered mental status EXAM: CHEST - 2 VIEW COMPARISON:  Chest x-ray dated August 10, 2021 FINDINGS: Cardiac and mediastinal contours are within normal limits. Status post transcatheter aortic valve replacement. Large hiatal hernia. Lungs are clear. No large pleural effusion or pneumothorax. 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 IMPRESSION: 1. Clear lungs. 2. Large hiatal hernia. Electronically Signed   By: Yetta Glassman M.D.   On: 09/22/2021 15:46        Scheduled Meds:  aspirin EC  81 mg Oral Daily   atorvastatin  10 mg Oral QHS   buPROPion  300 mg Oral QPC breakfast   clopidogrel  75 mg Oral Daily   DULoxetine  90 mg Oral QPC breakfast   enoxaparin (LOVENOX) injection  40 mg Subcutaneous Q24H   gabapentin  300 mg Oral TID   lidocaine  1 patch Transdermal Q24H   lurasidone  60 mg Oral Q supper   metoprolol succinate  25 mg Oral Daily   sodium chloride flush  3 mL Intravenous Q12H   Continuous Infusions:  dextrose 5% lactated ringers 75 mL/hr at 09/24/21 0035     LOS: 2 days  Time spent: 35 minutes    Barb Merino, MD [facility] Pager [phone]

## 2021-09-24 NOTE — TOC Transition Note (Signed)
Transition of Care [facility]) - CM/SW Discharge Note   Patient Details  Name: Nicole Graves MRN: [id number] Date of Birth: 1945/05/28  Transition of [facility]) CM/SW Contact:  [name], RN Phone Number: 09/24/2021, 10:04 AM   Clinical Narrative:    Patient is for possible dc today, she states her daughter will transport her home at dc.  NCM asked if she wanted to be set up with outpatient PT/OT , she states yes, she states she is ok going to the [place] on [address].  NCM sent referral over thru epic to Outpatient Rehab on [address] in [place].     Final next level of care: Home/Self Care Barriers to Discharge: No Barriers Identified   Patient Goals and CMS Choice Patient states their goals for this hospitalization and ongoing recovery are:: home with outpatient PT/OT   Choice offered to / list presented to : NA  Discharge Placement                       Discharge Plan and Services                  DME Agency: NA       HH Arranged: NA          Social Determinants of Health (SDOH) Interventions     Readmission Risk Interventions     No data to display

## 2021-09-25 DIAGNOSIS — F03918 Unspecified dementia, unspecified severity, with other behavioral disturbance: Secondary | ICD-10-CM | POA: Diagnosis not present

## 2021-09-25 LAB — GLUCOSE, CAPILLARY
Glucose-Capillary: 84 mg/dL (ref 70–99)
Glucose-Capillary: 63 mg/dL — ABNORMAL LOW (ref 70–99)
Glucose-Capillary: 146 mg/dL — ABNORMAL HIGH (ref 70–99)
Glucose-Capillary: 109 mg/dL — ABNORMAL HIGH (ref 70–99)
Glucose-Capillary: 107 mg/dL — ABNORMAL HIGH (ref 70–99)

## 2021-09-25 NOTE — Consult Note (Signed)
[facility] New Face-to-Face Psychiatric Evaluation   Service Date: September 25, 2021 LOS:  LOS: 3 days    Assessment  Nicole Graves is a 76 y.o. female admitted medically for 09/22/2021  2:02 PM for a stroke workup. She carries the psychiatric diagnoses of major depressive disorder and has a past medical history of  diabetes, CKD, hyperlipidemia, PAT, s/p TAVR, SVT, TR. Psychiatry was consulted for concern for TD, metabolic encephalopathy by Nicole Graves.    Her current presentation of new-onset hallucinations in the setting of ?stroke after a COVID infection is most consistent with metabolic encephalopathy. It seems like these episodes occur mostly at night, and sleep-wake cycle disorder (?sleepwalking) which can occur early in Parkinson's or LBD is also on the differential. Regardless, do not appear to be part of primary psychotic disorder or psychiatric phenomenon on narrative history. She has not had an episode of hallucinations since entry to the hospital. Separately, she has developed sequelae of current antipsychotic therapy including tardive dyskinesia, tremor  (mild on exam today) and some masking of facial expression. The original indication for antipsychotic (as far as I can tell) is treatment-resistant MDD rather than bipolar disorder or schizophrenia. Pt and family are open to discontinuation of this antipsychotic (as it seems to be making some things worse and is not preventing hallucinations while delirious). We will monitor off of medication for 1-2 days - pt also has geriatrics f/u next week. If hallucinations worsen or recur, would start low-potency atypical antipsychotic less likely to contribute to above sx such as quetiapine or olanzapine. Agree with neurologist Nicole Graves that antipsychotics should be avoided and harms minimized, but may become necessary in this case.    Diagnoses:  Active Hospital problems: Principal Problem:   Acute stroke due to ischemia  [facility]) Active Problems:   Paroxysmal atrial tachycardia (HCC)   Anemia of chronic disease   Type 2 diabetes mellitus with stage 3b chronic kidney disease, without long-term current use of insulin (HCC)   Chronic kidney disease, stage 3b (HCC)   Acute metabolic encephalopathy   Mixed hyperlipidemia due to type 2 diabetes mellitus ([place])   Major depressive disorder     Plan  ## Safety and Observation Level:  - Based on my clinical evaluation, I estimate the patient to be at low risk of self harm in the current setting - At this time, we recommend a routine level of observation. This decision is based on my review of the chart including patient's history and current presentation, interview of the patient, mental status examination, and consideration of suicide risk including evaluating suicidal ideation, plan, intent, suicidal or self-harm behaviors, risk factors, and protective factors. This judgment is based on our ability to directly address suicide risk, implement suicide prevention strategies and develop a safety plan while the patient is in the clinical setting. Please contact our team if there is a concern that risk level has changed.   ## Medications:  -- stop lurasidone -- OK to continue buproprion and duloxetine -- if pt complains of anxiety, feeling on edge, insomnia - first step would be to decrease buprorpion (CKD)    ## Medical Decision Making Capacity:  Not formally assessed3  ## Further Work-up:  -- per primary    -- most recent EKG on 8/9 had QtC of 453 -- Pertinent labwork reviewed earlier this admission includes: TSH wnl  ## Disposition:  -- per primary  Thank you for this consult request. Recommendations have been communicated to the  primary team.  We will continue to follow at this time.   Nicole Graves A Nicole Graves   New history   Relevant Aspects of Hospital Course:  Admitted on 09/22/2021 for metabolic encephalopathy.  Patient Report:  Pt seen in early  afternoon; she consents to have family informed and remain in room but they leave for lunch. Later updated family on plan.  Patient is quite pleasant although somewhat hard of hearing. She is oriented to self, situation, month, and year. She is able to recount some of the details of her hallucinations before she came to the hospital, but tells me they only happened one (per family was at night for ~1 week). She says that otherwise, her mental health and depression had been fairly good. Her psychiatrist recently downgraded her to yearly instead of every 6 months. She provides some social and psychiatric history which is below. States her family has been telling her there is something wrong with her face ever since she was started on [name], but generally likes how it makes her feel. She is open to stopping the latuda to see if it helps the movement. There are a few internal inconsistencies in her story - states she doesn't have kids (might have meant she doesn't live with her kids), then states she has 2, unclear on 4 vs 5 grandkids, etc. Repeats the same facts many times during eval (eg youngest grandkid is a sophomore in college). Feels she is struggling with her memory.   ROS She denies SI, HI, AH/VH. She endorses anxiety and worry over health, "overwhelmed". She does not endorse other psych sx during admission.   Collateral information:  Family indicates a few days of hallucinations at night.   Psychiatric History:  Information collected from pt  Pt with history of depression dx since dissolution of first marriage (>40 years). Has been on numerous antidepressants - combination of duloxetine and wellbutrin seemed to work the best. [name] was added within the last couple of years, and works well for mood with movement side effects (face twitching, affect not matching feelings). No history of SI, no history of psych hospitalization, no history of ECT.   Family psych history: One aunt with bipolar  disorder   Social History:  Lives with sister and brother in law 2 divorses - first marriage was bad, second marriage good until it wasn't. 2 kids. 1 kid 67 from first marriage, 1 kid 36 from second marriage. 5 grandkids (?)  Family History:  The patient's family history includes Colon cancer in her mother; Heart attack in her brother; Hypertension in her brother.  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 Lynnette Caffey and Dr. Laneta Simmers   Severe aortic stenosis    SVT (supraventricular tachycardia) (HCC)    TR (tricuspid regurgitation) 09/19/2016   moderate by echo 01/2020    Surgical History: Past Surgical History:  Procedure Laterality Date   CESAREAN  [place]   removal extra fat inner thighs   EYE SURGERY Bilateral     Cataract extraction with IOL   INTRAOPERATIVE TRANSTHORACIC ECHOCARDIOGRAM N/A 03/16/2021   Procedure: INTRAOPERATIVE TRANSTHORACIC ECHOCARDIOGRAM;  Surgeon: Early Osmond, MD;  Location: [facility];  Service: Open Heart Surgery;  Laterality: N/A;   RIGHT HEART CATH AND CORONARY ANGIOGRAPHY N/A 02/17/2021   Procedure: RIGHT HEART CATH AND CORONARY ANGIOGRAPHY;  Surgeon: Early Osmond, MD;  Location: [facility];  Service: Cardiovascular;  Laterality: N/A;   TOTAL KNEE ARTHROPLASTY Right 11/30/2015   Procedure: RIGHT TOTAL KNEE ARTHROPLASTY;  Surgeon: Gaynelle Arabian, MD;  Location: [facility];  Service: Orthopedics;   Laterality: Right;   TOTAL KNEE ARTHROPLASTY Left 11/14/2016   Procedure: LEFT TOTAL KNEE ARTHROPLASTY;  Surgeon: Gaynelle Arabian, MD;  Location: [facility];  Service: Orthopedics;  Laterality: Left;   TRANSCATHETER AORTIC VALVE REPLACEMENT, TRANSFEMORAL Right 03/16/2021   Procedure: TRANSCATHETER AORTIC VALVE REPLACEMENT, TRANSFEMORAL;  Surgeon: Early Osmond, MD;  Location: [facility];  Service: Open Heart Surgery;  Laterality: Right;    Medications:   Current Facility-Administered Medications:    acetaminophen (TYLENOL) tablet 650 mg, 650 mg, Oral, Q6H PRN **OR** acetaminophen (TYLENOL) suppository 650 mg, 650 mg, Rectal, Q6H PRN, Shalhoub, Sherryll Burger, MD   aspirin EC tablet 81 mg, 81 mg, Oral, Daily, Shalhoub, Sherryll Burger, MD, 81 mg at 09/25/21 0932   atorvastatin (LIPITOR) tablet 10 mg, 10 mg, Oral, QHS, Shalhoub, Sherryll Burger, MD, 10 mg at 09/24/21 2117   buPROPion (WELLBUTRIN XL) 24 hr tablet 300 mg, 300 mg, Oral, QPC breakfast, Shalhoub, Sherryll Burger, MD, 300 mg at 09/25/21 0932   [COMPLETED] clopidogrel (PLAVIX) tablet 300 mg, 300 mg, Oral, Once, 300 mg at 09/23/21 0140 **FOLLOWED BY** clopidogrel (PLAVIX) tablet 75 mg, 75 mg, Oral, Daily, Shalhoub, Sherryll Burger, MD, 75 mg at 09/25/21 0932   DULoxetine (CYMBALTA) DR capsule 90 mg, 90 mg, Oral, QPC breakfast, Shalhoub, Sherryll Burger, MD, 90 mg at 09/25/21 0932   enoxaparin (LOVENOX) injection 40 mg, 40 mg, Subcutaneous, Q24H, Shalhoub, Sherryll Burger, MD, 40 mg at 09/25/21 0931   gabapentin (NEURONTIN) capsule 300 mg, 300 mg, Oral, TID, Barb Merino, MD, 300 mg at 09/25/21 1628   lidocaine (LIDODERM) 5 % 1 patch, 1 patch, Transdermal, Q24H, Ghimire, Kuber, MD, 1 patch at 09/25/21 1442   melatonin tablet 5 mg, 5 mg, Oral, QHS PRN, Nolberto Hanlon, MD   metoprolol succinate (TOPROL-XL) 24 hr tablet 25 mg, 25 mg, Oral, Daily, Shalhoub, Sherryll Burger, MD, 25 mg at 09/25/21 0932   ondansetron (ZOFRAN) tablet 4 mg, 4 mg, Oral, Q6H PRN **OR** ondansetron (ZOFRAN) injection 4 mg, 4 mg,  Intravenous, Q6H PRN, Shalhoub, Sherryll Burger, MD   Oral care mouth rinse, 15 mL, Mouth Rinse, PRN, Barb Merino, MD   polyethylene glycol (MIRALAX / GLYCOLAX) packet 17 g, 17 g, Oral, Daily PRN, Shalhoub, Sherryll Burger, MD   sodium chloride flush (NS) 0.9 % injection 3 mL, 3 mL, Intravenous, Q12H, Shalhoub, Sherryll Burger, MD, 3 mL at 09/25/21 0933  Allergies: Allergies  Allergen Reactions   Sulfa Antibiotics Nausea And Vomiting and Other (See Comments)   Ativan [Lorazepam] Other (See Comments)    Hyperactive    Tizanidine Hcl Other (See Comments)    Pt does not remember        Objective  Vital signs:  Temp:  [97.3 F (36.3  C)-98.2 F (36.8 C)] 97.5 F (36.4 C) (08/12 1702) Pulse Rate:  [84-90] 86 (08/12 1702) Resp:  [16-20] 20 (08/12 1702) BP: (117-164)/(75-86) 132/83 (08/12 1702) SpO2:  [97 %-100 %] 100 % (08/12 1702)  Psychiatric Specialty Exam:  Presentation  General Appearance: Appropriate for Environment (purple hair) Eye Contact:Good Speech:Clear and Coherent ([place] accent) Speech Volume:Normal Handedness:No data recorded  Mood and Affect  Mood:-- (States worried about memory) Affect:Appropriate (full range)  Thought Process  Thought Processes:Coherent Descriptions of Associations:Loose  Orientation:Full (Time, Place and Person)  Thought Content:-- (forgetful, occasionally contradicts stuff)  History of Schizophrenia/Schizoaffective disorder:No data recorded Duration of Psychotic Symptoms:No data recorded Hallucinations:Hallucinations: None  Ideas of Reference:None  Suicidal Thoughts:Suicidal Thoughts: No  Homicidal Thoughts:Homicidal Thoughts: No   Sensorium  Memory:Immediate Fair; Recent Poor; Remote Fair Judgment:Fair Insight:Fair  Executive Functions  Concentration:Fair Attention Span:Fair Recall:Fair Fund of Knowledge:Fair Language:Fair  Psychomotor Activity  Psychomotor Activity:Psychomotor Activity: -- (mild sx of TD - some tonge movements.  somewhat masked facies.)  Assets  Assets:Communication Skills; Desire for Improvement; Social Support  Sleep  Sleep:Sleep: Fair   Physical Exam: Physical Exam HENT:     Head: Normocephalic.  Pulmonary:     Effort: Pulmonary effort is normal.  Neurological:     Mental Status: She is alert and oriented to person, place, and time.     Comments: Some mild lip twitching, tongue movments. Not bothersome to pt. Tremor. CNII-XII grossly intact although not very expressive  Psychiatric:        Mood and Affect: Mood normal.    Blood pressure 132/83, pulse 86, temperature (!) 97.5 F (36.4 C), temperature source Oral, resp. rate 20, height 5\' 4"  (1.626 m), weight 83 kg, SpO2 100 %. Body mass index is 31.41 kg/m.

## 2021-09-25 NOTE — Progress Notes (Signed)
PROGRESS NOTE    Nicole Graves  [id number] DOB: [birth year] DOA: 09/22/2021 PCP: Kathyrn Lass, MD    Brief Narrative:  76 year old with past medical history significant for severe aortic stenosis status post TAVR 02/2021, CKD 3B, paroxysmal atrial tachycardia, major depressive disorder, presented for evaluation of hallucination and confusion.  Per family since patient was diagnosed with COVID-19 in late June she has exhibited a gradual decline.  Progressive worsening weakness and decreased appetite.  Over the last several days she has exhibited episode of anxiety with hallucination. Evaluation in the ED MRI was obtained revealing right frontal centrum semiovale acute to subacute infarct that was thought to be an artifact.  Patient was admitted with further neurological work-up. Neurologically improving. Working on different differential diagnosis including psychiatric outbursts/early dementia related hallucinations. 8/12, psychiatry consulted on family request and neurology recommendations.   Assessment & Plan:   Acute metabolic encephalopathy/hallucinations: Primary cause unknown.  Clinically improving. MRI of the brain with 4 mm focus right frontal centrum semiovale, this was thought to be artifact without any clinical significance.  Acute stroke ruled out. Ammonia, 14 TSH, 1.6 RPR, nonreactive B12, 400 Urinalysis without concern for infection. Mental status is improving.  Physically debilitated.  Will need ongoing rehab and working with therapies in the hospital. Family reported seizure-like episodes, however EEG is without any epileptogenic focus.  Those were emotional outbursts.  Suspecting whether she had tardive dyskinesia.  Improved now. Neurology recommended outpatient follow-up for dementia work-up.  Avoid benzodiazepines.  Major depressive disorder: Patient is on multiple antidepressants including Cymbalta, Wellbutrin as well as [name].  Followed by outpatient psychiatry.   Less likely causing symptoms.  Neurology and family recommended psychiatry input, psychiatry consulted today. Patient is back on her medications except Flexeril and tramadol.  Paroxysmal atrial tachycardia: Stable on metoprolol.  Hypomagnesemia/hypokalemia: Replaced aggressively.  Improved levels.  CKD stage IIIb: At about baseline.  Patient is not diabetic.  She is with poor appetite.  A1c is 4.6.  Takes metformin for weight loss.  Allow regular diet with supplementation.  She may not need metformin but she may take it anyway.  Improve mobility.  Monitor.  Anticipate home with home health PT OT.  Will need supervision at home.   DVT prophylaxis: enoxaparin (LOVENOX) injection 40 mg Start: 09/23/21 1000   Code Status: Full code Family Communication: Daughter on the phone. Disposition Plan: Status is: Inpatient Remains inpatient appropriate because: Debility, psych eval.  More improve mobility before going home.     Consultants:  Neurology Psychiatry  Procedures:  None  Antimicrobials:  None   Subjective:  Patient seen and examined.  Denies any complaints today.  Has some difficulty mobilizing around.  No new events since last 48 hours.  No outbursts/no hallucinations. Discussed with family about [facility] psych evaluation, she is a scheduled to meet with geriatrics next week that they will keep up the appointment. Discussed with patient's daughter that once medical evaluation is done, she will be going home with outpatient follow-up with geriatrics and psychiatry.   Objective: Vitals:   09/24/21 2000 09/25/21 0000 09/25/21 0400 09/25/21 0744  BP: 125/75 136/80 (!) 156/85 (!) 164/86  Pulse: 88 84 87 87  Resp: 16 16 16 20   Temp: 97.9 F (36.6 C) 98.1 F (36.7 C) 98.2 F (36.8 C) (!) 97.3 F (36.3 C)  TempSrc: Oral Oral Oral Oral  SpO2: 98% 98% 100% 98%  Weight:      Height:        Intake/Output Summary (  Last 24 hours) at 09/25/2021 1034 Last data filed at  09/25/2021 0933 Gross per 24 hour  Intake 486 ml  Output --  Net 486 ml   Filed Weights   09/22/21 1823  Weight: 83 kg    Examination:  General exam: Appears calm and comfortable, pleasant and conversant today. Alert oriented today.  Without any neurological deficits today.  Respiratory system: No added sounds. Cardiovascular system: S1 & S2 heard, RRR. No pedal edema. Gastrointestinal system: Abdomen is nondistended, soft and nontender. No organomegaly or masses felt. Normal bowel sounds heard. Central nervous system: Alert and oriented. No focal neurological deficits.  Generalized weakness. Extremities: Symmetric 5 x 5 power.    Data Reviewed: I have personally reviewed following labs and imaging studies  CBC: Recent Labs  Lab 09/22/21 1509 09/23/21 0455  WBC 5.9 5.3  NEUTROABS 3.4 2.6  HGB 10.9* 9.7*  HCT 33.9* 29.8*  MCV 96.0 94.3  PLT 238 [id number]   Basic Metabolic Panel: Recent Labs  Lab 09/22/21 1509 09/23/21 0455 09/24/21 0242  NA 139 139 138  K 3.5 3.4* 3.8  CL 105 108 107  CO2 26 25 24   GLUCOSE 89 90 102*  BUN 28* 19 13  CREATININE 1.35* 1.18* 1.15*  CALCIUM 10.3 9.1 9.1  MG  --  1.3* 1.7  PHOS  --   --  2.9   GFR: Estimated Creatinine Clearance: 42 mL/min (A) (by C-G formula based on SCr of 1.15 mg/dL (H)). Liver Function Tests: Recent Labs  Lab 09/22/21 1509 09/23/21 0455  AST 14* 12*  ALT 9 8  ALKPHOS 48 40  BILITOT 0.5 0.5  PROT 6.4* 5.3*  ALBUMIN 3.6 2.9*   No results for input(s): "LIPASE", "AMYLASE" in the last 168 hours. Recent Labs  Lab 09/23/21 0854  AMMONIA 14   Coagulation Profile: No results for input(s): "INR", "PROTIME" in the last 168 hours. Cardiac Enzymes: No results for input(s): "CKTOTAL", "CKMB", "CKMBINDEX", "TROPONINI" in the last 168 hours. BNP (last 3 results) No results for input(s): "PROBNP" in the last 8760 hours. HbA1C: Recent Labs    09/23/21 0455  HGBA1C 4.9   CBG: Recent Labs  Lab  09/24/21 1146 09/24/21 1634 09/24/21 2138 09/25/21 0621 09/25/21 0747  GLUCAP 116* 104* 105* 84 63*   Lipid Profile: Recent Labs    09/23/21 0455  CHOL 136  HDL 59  LDLCALC 64  TRIG 64  CHOLHDL 2.3   Thyroid Function Tests: Recent Labs    09/24/21 0242  TSH 1.604   Anemia Panel: Recent Labs    09/23/21 0455  VITAMINB12 400  FOLATE 10.3  TIBC 294  IRON 52   Sepsis Labs: No results for input(s): "PROCALCITON", "LATICACIDVEN" in the last 168 hours.  Recent Results (from the past 240 hour(s))  Urine Culture     Status: None   Collection Time: 09/22/21  5:16 PM   Specimen: Urine, Clean Catch  Result Value Ref Range Status   Specimen Description   Final    URINE, CLEAN CATCH Performed at [facility], [place] [address] [place], [place] [address]    Special Requests   Final    NONE Performed at [facility], [place] [address] [place], [place] [address]    Culture   Final    NO GROWTH Performed at [facility], [place] [address] [place], [place] [address]    Report Status 09/24/2021 FINAL  Final  Urine Culture     Status:  Abnormal   Collection Time: 09/23/21  8:19 AM   Specimen: Urine, Clean Catch  Result Value Ref Range Status   Specimen Description URINE, CLEAN CATCH  Final   Special Requests   Final    NONE Performed at [facility], [address], [place], [place] [address]    Culture MULTIPLE SPECIES PRESENT, SUGGEST RECOLLECTION (A)  Final   Report Status 09/24/2021 FINAL  Final         Radiology Studies: ECHOCARDIOGRAM COMPLETE BUBBLE STUDY  Result Date: 09/24/2021    ECHOCARDIOGRAM REPORT   Patient Name:   Nicole Graves Date of Exam: 09/24/2021 Medical Rec #:  [id number]    Height:       64.0 in Accession #:    [id number]   Weight:       183.0 lb Date of Birth:  [birth year]    BSA:          1.884 m Patient Age:    76 years     BP:           131/85 mmHg Patient Gender: F            HR:           88 bpm.  Exam Location:  Inpatient Procedure: 2D Echo, Cardiac Doppler, Color Doppler and Saline Contrast Bubble            Study Indications:    Stroke  History:        Patient has prior history of Echocardiogram examinations, most                 recent 04/16/2021. Risk Factors:Dyslipidemia and Diabetes. S/P                 TAVR with 29 mm CoreValve-Evolut Pro prosthetic (TAVR) valve                 present in the aortic position. CKD.  Sonographer:    Ross Ludwig RDCS (AE) Referring Phys: Deno Lunger [facility] IMPRESSIONS  1. Left ventricular ejection fraction, by estimation, is 60 to 65%. The left ventricle has normal function. The left ventricle has no regional wall motion abnormalities. Left ventricular diastolic parameters are indeterminate. Elevated left ventricular end-diastolic pressure.  2. Right ventricular systolic function is normal. The right ventricular size is normal. There is mildly elevated pulmonary artery systolic pressure. The estimated right ventricular systolic pressure is 36.9 mmHg.  3. The mitral valve is normal in structure. Mild mitral valve regurgitation. No evidence of mitral stenosis.  4. The aortic valve has been repaired/replaced. There is a 29 mm CoreValve-Evolut Pro prosthetic, stented (TAVR) valve present in the aortic position.     Aortic valve regurgitation is not visualized. No aortic stenosis is present. Aortic valve area, by VTI measures 3.48 cm. Aortic valve mean gradient measures 5.0 mmHg. Aortic valve Vmax measures 1.46 m/s.  5. The inferior vena cava is dilated in size with >50% respiratory variability, suggesting right atrial pressure of 8 mmHg. Conclusion(s)/Recommendation(s): No intracardiac source of embolism detected on this transthoracic study. Consider a transesophageal echocardiogram to exclude cardiac source of embolism if clinically indicated. FINDINGS  Left Ventricle: Left ventricular ejection fraction, by estimation, is 60 to 65%. The left ventricle has normal function. The  left ventricle has no regional wall motion abnormalities. The left ventricular internal cavity size was normal in size. There is  no left ventricular hypertrophy. Left ventricular diastolic parameters are indeterminate. Elevated left ventricular end-diastolic pressure.  Right Ventricle: The right ventricular size is normal. No increase in right ventricular wall thickness. Right ventricular systolic function is normal. There is mildly elevated pulmonary artery systolic pressure. The tricuspid regurgitant velocity is 2.69  m/s, and with an assumed right atrial pressure of 8 mmHg, the estimated right ventricular systolic pressure is 36.9 mmHg. Left Atrium: Left atrial size was normal in size. Right Atrium: Right atrial size was normal in size. Pericardium: There is no evidence of pericardial effusion. Mitral Valve: The mitral valve is normal in structure. Mild to moderate mitral annular calcification. Mild mitral valve regurgitation. No evidence of mitral valve stenosis. Tricuspid Valve: The tricuspid valve is normal in structure. Tricuspid valve regurgitation is mild . No evidence of tricuspid stenosis. Aortic Valve: The aortic valve has been repaired/replaced. Aortic valve regurgitation is not visualized. No aortic stenosis is present. Aortic valve mean gradient measures 5.0 mmHg. Aortic valve peak gradient measures 8.5 mmHg. Aortic valve area, by VTI measures 3.48 cm. There is a 29 mm CoreValve-Evolut Pro prosthetic, stented (TAVR) valve present in the aortic position. Pulmonic Valve: The pulmonic valve was normal in structure. Pulmonic valve regurgitation is trivial. No evidence of pulmonic stenosis. Aorta: The aortic root is normal in size and structure. Venous: The inferior vena cava is dilated in size with greater than 50% respiratory variability, suggesting right atrial pressure of 8 mmHg. IAS/Shunts: No atrial level shunt detected by color flow Doppler. Agitated saline contrast was given intravenously to  evaluate for intracardiac shunting.  LEFT VENTRICLE PLAX 2D LVIDd:         4.20 cm   Diastology LVIDs:         2.90 cm   LV e' medial:    6.20 cm/s LV PW:         0.90 cm   LV E/e' medial:  17.1 LV IVS:        0.90 cm   LV e' lateral:   7.51 cm/s LVOT diam:     2.70 cm   LV E/e' lateral: 14.1 LV SV:         100 LV SV Index:   53 LVOT Area:     5.73 cm  RIGHT VENTRICLE             IVC RV Basal diam:  3.10 cm     IVC diam: 2.30 cm RV S prime:     14.50 cm/s TAPSE (M-mode): 2.2 cm LEFT ATRIUM             Index        RIGHT ATRIUM           Index LA diam:        2.40 cm 1.27 cm/m   RA Area:     13.20 cm LA Vol (A2C):   48.9 ml 25.96 ml/m  RA Volume:   31.40 ml  16.67 ml/m LA Vol (A4C):   45.6 ml 24.21 ml/m LA Biplane Vol: 52.0 ml 27.60 ml/m  AORTIC VALVE AV Area (Vmax):    3.53 cm AV Area (Vmean):   3.74 cm AV Area (VTI):     3.48 cm AV Vmax:           146.00 cm/s AV Vmean:          98.000 cm/s AV VTI:            0.288 m AV Peak Grad:      8.5 mmHg AV Mean Grad:      5.0 mmHg  LVOT Vmax:         89.90 cm/s LVOT Vmean:        64.000 cm/s LVOT VTI:          0.175 m LVOT/AV VTI ratio: 0.61  AORTA Ao Root diam: 2.50 cm Ao Asc diam:  3.00 cm MITRAL VALVE                TRICUSPID VALVE MV Area (PHT): 2.87 cm     TR Peak grad:   28.9 mmHg MV Decel Time: 264 msec     TR Vmax:        269.00 cm/s MV E velocity: 106.00 cm/s MV A velocity: 97.10 cm/s   SHUNTS MV E/A ratio:  1.09         Systemic VTI:  0.18 m                             Systemic Diam: 2.70 cm [name] MD Electronically signed by [name] MD Signature Date/Time: 09/24/2021/4:53:02 PM    Final    EEG adult  Result Date: 09/23/2021 Charlsie Quest, MD     09/23/2021  6:58 PM Patient Name: Nicole Graves MRN: [id number] Epilepsy Attending: Charlsie Quest Referring Physician/Provider: Marjorie Smolder, NP Date: 09/23/2021 Duration: 22.23 mins Patient history: 76yo F with ams. EEG to evaluate for seizure Level of alertness: Awake AEDs during EEG  study: None Technical aspects: This EEG study was done with scalp electrodes positioned according to the 10-20 International system of electrode placement. Electrical activity was reviewed with band pass filter of 1-70Hz , sensitivity of 7 uV/mm, display speed of 40mm/sec with a 60Hz  notched filter applied as appropriate. EEG data were recorded continuously and digitally stored.  Video monitoring was available and reviewed as appropriate. Description: No posterior dominant rhythm was seen. EEG showed continuous generalized 3 to 6 Hz theta-delta slowing.  Hyperventilation and photic stimulation were not performed.     ABNORMALITY - Continuous slow, generalized  IMPRESSION: This study is suggestive of moderate diffuse encephalopathy, nonspecific etiology. No seizures or epileptiform discharges were seen throughout the recording.   Priyanka         Scheduled Meds:  aspirin EC  81 mg Oral Daily   atorvastatin  10 mg Oral QHS   buPROPion  300 mg Oral QPC breakfast   clopidogrel  75 mg Oral Daily   DULoxetine  90 mg Oral QPC breakfast   enoxaparin (LOVENOX) injection  40 mg Subcutaneous Q24H   gabapentin  300 mg Oral TID   lidocaine  1 patch Transdermal Q24H   lurasidone  60 mg Oral Q supper   metoprolol succinate  25 mg Oral Daily   sodium chloride flush  3 mL Intravenous Q12H   Continuous Infusions:     LOS: 3 days    Time spent: 35 minutes    Annabelle Harman, MD [facility] Pager [phone]

## 2021-09-26 DIAGNOSIS — G9341 Metabolic encephalopathy: Secondary | ICD-10-CM | POA: Diagnosis not present

## 2021-09-26 DIAGNOSIS — F329 Major depressive disorder, single episode, unspecified: Secondary | ICD-10-CM | POA: Diagnosis not present

## 2021-09-26 DIAGNOSIS — D638 Anemia in other chronic diseases classified elsewhere: Secondary | ICD-10-CM | POA: Diagnosis not present

## 2021-09-26 DIAGNOSIS — N1832 Chronic kidney disease, stage 3b: Secondary | ICD-10-CM | POA: Diagnosis not present

## 2021-09-26 LAB — GLUCOSE, CAPILLARY
Glucose-Capillary: 80 mg/dL (ref 70–99)
Glucose-Capillary: 264 mg/dL — ABNORMAL HIGH (ref 70–99)
Glucose-Capillary: 98 mg/dL (ref 70–99)
Glucose-Capillary: 98 mg/dL (ref 70–99)

## 2021-09-26 MED ORDER — PANTOPRAZOLE SODIUM 40 MG PO TBEC
40.0000 mg | DELAYED_RELEASE_TABLET | Freq: Every day | ORAL | Status: DC
  Administered 2021-09-26 – 2021-09-27 (×2): 40 mg via ORAL
  Filled 2021-09-26 (×2): qty 1

## 2021-09-26 NOTE — Consult Note (Signed)
[facility] New Face-to-Face Psychiatric Evaluation   Service Date: September 26, 2021 LOS:  LOS: 4 days    Assessment  Nicole Graves is a 76 y.o. female admitted medically for 09/22/2021  2:02 PM for a stroke workup. She carries the psychiatric diagnoses of major depressive disorder and has a past medical history of  diabetes, CKD, hyperlipidemia, PAT, s/p TAVR, SVT, TR. Psychiatry was consulted for concern for TD, metabolic encephalopathy by Dr. Sloan Leiter.    Her current presentation of new-onset hallucinations in the setting of ?stroke after a COVID infection is most consistent with metabolic encephalopathy. It seems like these episodes occur mostly at night, and sleep-wake cycle disorder (?sleepwalking) which can occur early in Parkinson's or LBD is also on the differential. Regardless, do not appear to be part of primary psychotic disorder or psychiatric phenomenon on narrative history. She has not had an episode of hallucinations since entry to the hospital. Separately, she has developed sequelae of current antipsychotic therapy including tardive dyskinesia, tremor  (mild on exam today) and some masking of facial expression. The original indication for antipsychotic (as far as I can tell) is treatment-resistant MDD rather than bipolar disorder or schizophrenia. Pt and family are open to discontinuation of this antipsychotic (as it seems to be making some things worse and is not preventing hallucinations while delirious). We will monitor off of medication for 1-2 days - pt also has geriatrics f/u next week. If hallucinations worsen or recur, would start low-potency atypical antipsychotic less likely to contribute to above sx such as quetiapine or olanzapine. Agree with neurologist Dr. Cheral Marker that antipsychotics should be avoided and harms minimized, but may become necessary in this case.   On re-eval 8/13 pt was alert, oriented, with no worsening of mood or further episodes of  hallucinations. Appropriate for outpt f/u. Have put some thoughts about next steps for medication below. Query if tramadol (serotonergic) caused episode of delirium/mild serotonin toxicity - other main culprit would be flexeril which was also stopped.  Diagnoses:  Active Hospital problems: Principal Problem:   Acute stroke due to ischemia [facility]) Active Problems:   Paroxysmal atrial tachycardia (HCC)   Anemia of chronic disease   Type 2 diabetes mellitus with stage 3b chronic kidney disease, without long-term current use of insulin (HCC)   Chronic kidney disease, stage 3b (HCC)   Acute metabolic encephalopathy   Mixed hyperlipidemia due to type 2 diabetes mellitus ([place])   Major depressive disorder     Plan  ## Safety and Observation Level:  - Based on my clinical evaluation, I estimate the patient to be at low risk of self harm in the current setting - At this time, we recommend a routine level of observation. This decision is based on my review of the chart including patient's history and current presentation, interview of the patient, mental status examination, and consideration of suicide risk including evaluating suicidal ideation, plan, intent, suicidal or self-harm behaviors, risk factors, and protective factors. This judgment is based on our ability to directly address suicide risk, implement suicide prevention strategies and develop a safety plan while the patient is in the clinical setting. Please contact our team if there is a concern that risk level has changed.   ## Medications:   -- stop lurasidone -- OK to continue buproprion and duloxetine -- if pt complains of anxiety, feeling on edge, insomnia - first step would be to decrease buprorpion (CKD)  -- if TD worsens or does not improve 1-2  weeks after stop of lurasidone would start ingrezza  -- if antipsychotic becomes necessary, quetiapine and olanzapine both have favorable profiles re: TD and other extrapyramidal side effects  (but unfavorable wt gain)   ## Medical Decision Making Capacity:  Not formally assessed  ## Further Work-up:  -- per primary    -- most recent EKG on 8/9 had QtC of 453 -- Pertinent labwork reviewed earlier this admission includes: TSH wnl  ## Disposition:  -- per primary  Thank you for this consult request. Recommendations have been communicated to the primary team.  We will continue to follow at this time.   Suzann Lazaro A Tewana Bohlen   New history   Relevant Aspects of Hospital Course:  Admitted on 09/22/2021 for metabolic encephalopathy.  Patient Report:  Pt seen with sister in room, consents to have remain. Alert and oriented. Discussed diagnosis and plan, answering questions as appropriate. Orofacial movements worse today, but do not bother pt. Discussed potential for w/d dyskinesia with pt and sister - it sounds like she might have been on a high potency and switched to latuda at some point (had movements, was put on latuda, movements got better). Explained risk of worsening depression and emphasized need to keep geriatric appt and psych appt (in October). Informed psych would be signing off.   ROS She denies SI, HI, AH/VH. She endorses less anxiety and worry over health than yesterday. She does not endorse other psych sx during admission.   Collateral information:  Family indicates a few days of hallucinations at night.   Psychiatric History:  Information collected from pt  Pt with history of depression dx since dissolution of first marriage (>40 years). Has been on numerous antidepressants - combination of duloxetine and wellbutrin seemed to work the best. [name] was added within the last couple of years, and works well for mood with movement side effects (face twitching, affect not matching feelings). No history of SI, no history of psych hospitalization, no history of ECT.   Family psych history: One aunt with bipolar disorder   Social History:  Lives with sister and  brother in law 2 divorses - first marriage was bad, second marriage good until it wasn't. 2 kids. 1 kid 27 from first marriage, 1 kid 65 from second marriage. 5 grandkids (?)  Family History:  The patient's family history includes Colon cancer in her mother; Heart attack in her brother; Hypertension in her brother.  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 Lynnette Caffey and Dr. Laneta Simmers   Severe aortic stenosis    SVT (supraventricular tachycardia) (HCC)    TR (tricuspid regurgitation) 09/19/2016   moderate by echo 01/2020    Surgical History: Past Surgical History:  Procedure Laterality Date   CESAREAN SECTION  1964   X2   CHOLECYSTECTOMY     COSMETIC SURGERY  1980   removal extra fat inner thighs   EYE SURGERY  Bilateral     Cataract extraction with IOL   INTRAOPERATIVE TRANSTHORACIC ECHOCARDIOGRAM N/A 03/16/2021   Procedure: INTRAOPERATIVE TRANSTHORACIC ECHOCARDIOGRAM;  Surgeon: Early Osmond, MD;  Location: [facility];  Service: Open Heart Surgery;  Laterality: N/A;   RIGHT HEART CATH AND CORONARY ANGIOGRAPHY N/A 02/17/2021   Procedure: RIGHT HEART CATH AND CORONARY ANGIOGRAPHY;  Surgeon: Early Osmond, MD;  Location: [facility];  Service: Cardiovascular;  Laterality: N/A;   TOTAL KNEE ARTHROPLASTY Right 11/30/2015   Procedure: RIGHT TOTAL KNEE ARTHROPLASTY;  Surgeon: Gaynelle Arabian, MD;  Location: [facility];  Service: Orthopedics;  Laterality: Right;   TOTAL KNEE ARTHROPLASTY Left  11/14/2016   Procedure: LEFT TOTAL KNEE ARTHROPLASTY;  Surgeon: Gaynelle Arabian, MD;  Location: [facility];  Service: Orthopedics;  Laterality: Left;   TRANSCATHETER AORTIC VALVE REPLACEMENT, TRANSFEMORAL Right 03/16/2021   Procedure: TRANSCATHETER AORTIC VALVE REPLACEMENT, TRANSFEMORAL;  Surgeon: Early Osmond, MD;  Location: [facility];  Service: Open Heart Surgery;  Laterality: Right;    Medications:   Current Facility-Administered Medications:    acetaminophen (TYLENOL) tablet 650 mg, 650 mg, Oral, Q6H PRN **OR** acetaminophen (TYLENOL) suppository 650 mg, 650 mg, Rectal, Q6H PRN, Shalhoub, Sherryll Burger, MD   aspirin EC tablet 81 mg, 81 mg, Oral, Daily, Shalhoub, Sherryll Burger, MD, 81 mg at 09/26/21 0954   atorvastatin (LIPITOR) tablet 10 mg, 10 mg, Oral, QHS, Shalhoub, Sherryll Burger, MD, 10 mg at 09/25/21 2029   buPROPion (WELLBUTRIN XL) 24 hr tablet 300 mg, 300 mg, Oral, QPC breakfast, Shalhoub, Sherryll Burger, MD, 300 mg at 09/26/21 [id number]   [COMPLETED] clopidogrel (PLAVIX) tablet 300 mg, 300 mg, Oral, Once, 300 mg at 09/23/21 0140 **FOLLOWED BY** clopidogrel (PLAVIX) tablet 75 mg, 75 mg, Oral, Daily, Shalhoub, Sherryll Burger, MD, 75 mg at 09/26/21 0952   DULoxetine (CYMBALTA) DR capsule 90 mg, 90 mg, Oral, QPC breakfast, Shalhoub, Sherryll Burger, MD, 90 mg at 09/26/21 0952   enoxaparin (LOVENOX) injection 40 mg, 40 mg, Subcutaneous, Q24H, Shalhoub, Sherryll Burger, MD, 40 mg at 09/26/21 [id number]   gabapentin (NEURONTIN) capsule 300 mg, 300 mg, Oral, TID, Barb Merino, MD, 300 mg at 09/26/21 0952   lidocaine (LIDODERM) 5 % 1 patch, 1 patch, Transdermal, Q24H, Ghimire, Kuber, MD, 1 patch at 09/25/21 1442   melatonin tablet 5 mg, 5 mg, Oral, QHS PRN, Nolberto Hanlon, MD   metoprolol succinate (TOPROL-XL) 24 hr tablet 25 mg, 25 mg, Oral, Daily, Shalhoub, Sherryll Burger, MD, 25 mg at 09/26/21 0952   ondansetron (ZOFRAN) tablet 4 mg, 4 mg, Oral, Q6H PRN **OR** ondansetron (ZOFRAN) injection 4 mg, 4 mg, Intravenous, Q6H PRN, Shalhoub, Sherryll Burger, MD   Oral  care mouth rinse, 15 mL, Mouth Rinse, PRN, Sloan Leiter, Kuber, MD   polyethylene glycol (MIRALAX / GLYCOLAX) packet 17 g, 17 g, Oral, Daily PRN, Shalhoub, Sherryll Burger, MD   sodium chloride flush (NS) 0.9 % injection 3 mL, 3 mL, Intravenous, Q12H, Shalhoub, Sherryll Burger, MD, 3 mL at 09/26/21 0954  Allergies: Allergies  Allergen Reactions   Sulfa Antibiotics Nausea And Vomiting and Other (See Comments)   Ativan [Lorazepam] Other (See Comments)    Hyperactive    Tizanidine Hcl Other (See Comments)    Pt does not remember        Objective  Vital signs:  Temp:  [97.5 F (36.4 C)-98.5 F (36.9 C)] 98.5 F (36.9 C) (08/13 1243) Pulse Rate:  [80-94] 81 (08/13 1243) Resp:  [16-20] 20 (08/13 0845) BP: (100-136)/(66-83) 136/83 (08/13 1243)  SpO2:  [97 %-100 %] 100 % (08/13 1243)  Psychiatric Specialty Exam:  Presentation  General Appearance: Appropriate for Environment Eye Contact:Good Speech:Clear and Coherent ([place] accent) Speech Volume:Normal Handedness:No data recorded  Mood and Affect  Mood:-- (Pretty good today) Affect:Appropriate; Congruent  Thought Process  Thought Processes:Coherent; Goal Directed; Linear Descriptions of Associations:Intact  Orientation:Full (Time, Place and Person)  Thought Content:-- (less internal contradictions (sister present))  History of Schizophrenia/Schizoaffective disorder:No data recorded Duration of Psychotic Symptoms:No data recorded Hallucinations:Hallucinations: None  Ideas of Reference:None  Suicidal Thoughts:Suicidal Thoughts: No  Homicidal Thoughts:Homicidal Thoughts: No   Sensorium  Memory:Immediate Fair; Recent Poor; Remote Fair Judgment:Fair Insight:Fair  Executive Functions  Concentration:Fair Attention Span:Fair Recall:Fair Fund of Knowledge:Fair Language:Fair  Psychomotor Activity  Psychomotor Activity:Psychomotor Activity: -- (interval increase in involuntary facial movements, mostly around mouth)  Assets   Assets:Communication Skills; Desire for Improvement; Social Support  Sleep  Sleep:Sleep: Fair   Physical Exam: Physical Exam HENT:     Head: Normocephalic.  Pulmonary:     Effort: Pulmonary effort is normal.  Neurological:     Mental Status: She is alert and oriented to person, place, and time.     Comments: Some moderate lip twitching, tongue movments. Not bothersome to pt.   Psychiatric:        Mood and Affect: Mood normal.    Blood pressure 136/83, pulse 81, temperature 98.5 F (36.9 C), temperature source Oral, resp. rate 20, height 5\' 4"  (1.626 m), weight 83 kg, SpO2 100 %. Body mass index is 31.41 kg/m.

## 2021-09-26 NOTE — Progress Notes (Signed)
PROGRESS NOTE    Nicole Graves  HQR:[id number] DOB: Aug 15, 1945 DOA: 09/22/2021 PCP: [name], MD    Brief Narrative:   76 year old female with past medical history significant for severe aortic stenosis status post TAVR 02/2021, CKD 3b, paroxysmal atrial tachycardia, major depressive disorder, presented to the hospital for evaluation of hallucination and confusion.  Per family since patient was diagnosed with COVID-19 in late June she has exhibited a gradual decline.  She had progressive worsening weakness and decreased appetite.  Over the last several days, she has exhibited episode of anxiety with hallucination.  In the ED MRI was done revealing right frontal centrum semiovale acute to subacute infarct that was thought to be an artifact.  Patient was admitted with further neurological work-up. Working on different differential diagnosis including psychiatric outbursts/early dementia related hallucinations.  On 8/12, psychiatry consulted on family request and neurology recommendations.   Assessment & Plan:  Principal Problem:   Acute stroke due to ischemia [facility]) Active Problems:   Acute metabolic encephalopathy   Chronic kidney disease, stage 3b (HCC)   Type 2 diabetes mellitus with stage 3b chronic kidney disease, without long-term current use of insulin (HCC)   Paroxysmal atrial tachycardia (HCC)   Anemia of chronic disease   Mixed hyperlipidemia due to type 2 diabetes mellitus (HCC)   Major depressive disorder   Acute metabolic encephalopathy/hallucinations: Improving at this time. MRI of the brain with 4 mm focus right frontal centrum semiovale, this was thought to be artifact without any clinical significance.  Acute stroke ruled out. Ammonia, 14. TSH, 1.6. RPR, non-reactive.  Vitamin B12 at 400.  Urinalysis without concern for infection.  EEG without any focal seizures.  Neurology recommended outpatient follow-up for dementia work-up.  Avoid benzodiazepines.  No further  hallucinations.  Major depressive disorder: Patient was multiple antidepressants including Cymbalta, Wellbutrin as well as [name] as outpatient..  Followed by outpatient psychiatry.  Psychiatry was consulted during hospitalization.  Patient is still on Wellbutrin, Cymbalta.  Of tramadol and Flexeril.    Paroxysmal atrial tachycardia: Rate controlled, continue metoprolol.  Hypomagnesemia/hypokalemia: Has been replenished.  No labs today.  We will check in AM.  CKD stage IIIb: At about baseline.  Check BMP in AM.  Obesity.  Takes metformin for weight loss.  Hemoglobin A1c of 4.6.  Impaired mobility.  Physical therapy has recommended home health PT OT on discharge.  DVT prophylaxis: enoxaparin (LOVENOX) injection 40 mg Start: 09/23/21 1000   Code Status: Full code  Family Communication:  None today.    Disposition Plan: Status is: Inpatient  Remains inpatient appropriate because: Debility, psych follow-up.     Consultants:  Neurology Psychiatry  Procedures:  None  Antimicrobials:  None  Subjective:  Today, patient was seen and examined at bedside.  Patient states that she feels better today.  Denies any dizziness lightheadedness nausea vomiting fever.  Denies hallucinations.  Objective: Vitals:   09/25/21 2012 09/26/21 0011 09/26/21 0416 09/26/21 0845  BP: 100/66 106/71 116/77 124/78  Pulse: 88 83 80 94  Resp: 18  16 20   Temp: 98.2 F (36.8 C) 97.7 F (36.5 C) 97.9 F (36.6 C) 98.2 F (36.8 C)  TempSrc: Oral Oral Oral Oral  SpO2: 99% 97% 97% 97%  Weight:      Height:        Intake/Output Summary (Last 24 hours) at 09/26/2021 0856 Last data filed at 09/26/2021 0757 Gross per 24 hour  Intake 423 ml  Output 1 ml  Net 422 ml  Filed Weights   09/22/21 1823  Weight: 83 kg    Examination: Body mass index is 31.41 kg/m.   General: Obese built, not in obvious distress HENT:   No scleral pallor or icterus noted. Oral mucosa is moist.  Chest:  Clear  breath sounds.  Diminished breath sounds bilaterally. No crackles or wheezes.  CVS: S1 &S2 heard. No murmur.  Regular rate and rhythm. Abdomen: Soft, nontender, nondistended.  Bowel sounds are heard.   Extremities: No cyanosis, clubbing or edema.  Peripheral pulses are palpable. Psych: Alert, awake and oriented, normal mood CNS:  No cranial nerve deficits.  Generalized weakness noted Skin: Warm and dry.  No rashes noted.   Data Reviewed:  I have personally reviewed the following labs and imaging studies.     CBC: Recent Labs  Lab 09/22/21 1509 09/23/21 0455  WBC 5.9 5.3  NEUTROABS 3.4 2.6  HGB 10.9* 9.7*  HCT 33.9* 29.8*  MCV 96.0 94.3  PLT 238 203    Basic Metabolic Panel: Recent Labs  Lab 09/22/21 1509 09/23/21 0455 09/24/21 0242  NA 139 139 138  K 3.5 3.4* 3.8  CL 105 108 107  CO2 26 25 24   GLUCOSE 89 90 102*  BUN 28* 19 13  CREATININE 1.35* 1.18* 1.15*  CALCIUM 10.3 9.1 9.1  MG  --  1.3* 1.7  PHOS  --   --  2.9    GFR: Estimated Creatinine Clearance: 42 mL/min (A) (by C-G formula based on SCr of 1.15 mg/dL (H)). Liver Function Tests: Recent Labs  Lab 09/22/21 1509 09/23/21 0455  AST 14* 12*  ALT 9 8  ALKPHOS 48 40  BILITOT 0.5 0.5  PROT 6.4* 5.3*  ALBUMIN 3.6 2.9*    No results for input(s): "LIPASE", "AMYLASE" in the last 168 hours. Recent Labs  Lab 09/23/21 0854  AMMONIA 14    Coagulation Profile: No results for input(s): "INR", "PROTIME" in the last 168 hours. Cardiac Enzymes: No results for input(s): "CKTOTAL", "CKMB", "CKMBINDEX", "TROPONINI" in the last 168 hours. BNP (last 3 results) No results for input(s): "PROBNP" in the last 8760 hours. HbA1C: No results for input(s): "HGBA1C" in the last 72 hours.  CBG: Recent Labs  Lab 09/25/21 0747 09/25/21 1249 09/25/21 1658 09/25/21 2029 09/26/21 0618  GLUCAP 63* 146* 109* 107* 80    Lipid Profile: No results for input(s): "CHOL", "HDL", "LDLCALC", "TRIG", "CHOLHDL",  "LDLDIRECT" in the last 72 hours.  Thyroid Function Tests: Recent Labs    09/24/21 0242  TSH 1.604    Anemia Panel: No results for input(s): "VITAMINB12", "FOLATE", "FERRITIN", "TIBC", "IRON", "RETICCTPCT" in the last 72 hours.  Sepsis Labs: No results for input(s): "PROCALCITON", "LATICACIDVEN" in the last 168 hours.  Recent Results (from the past 240 hour(s))  Urine Culture     Status: None   Collection Time: 09/22/21  5:16 PM   Specimen: Urine, Clean Catch  Result Value Ref Range Status   Specimen Description   Final    URINE, CLEAN CATCH Performed at [facility], [address] [place], [place]    Special Requests   Final    NONE Performed at [facility], [address], [place], [place]    Culture   Final    NO GROWTH Performed at [facility], [address], [place], [place]    Report Status 09/24/2021 FINAL  Final  Urine Culture     Status: Abnormal  Collection Time: 09/23/21  8:19 AM   Specimen: Urine, Clean Catch  Result Value Ref Range Status   Specimen Description URINE, CLEAN CATCH  Final   Special Requests   Final    NONE Performed at [facility], [address] [place], [place] [address]    Culture MULTIPLE SPECIES PRESENT, SUGGEST RECOLLECTION (A)  Final   Report Status 09/24/2021 FINAL  Final     Radiology Studies: ECHOCARDIOGRAM COMPLETE BUBBLE STUDY  Result Date: 09/24/2021    ECHOCARDIOGRAM REPORT   Patient Name:   LUUL TORELLI Date of Exam: 09/24/2021 Medical Rec #:  [id number]    Height:       64.0 in Accession #:    [id number]   Weight:       183.0 lb Date of Birth:  August 11, 1945    BSA:          1.884 m Patient Age:    22 years     BP:           131/85 mmHg Patient Gender: F            HR:           88 bpm. Exam Location:  Inpatient Procedure: 2D Echo, Cardiac Doppler, Color Doppler and Saline Contrast Bubble            Study Indications:    Stroke  History:         Patient has prior history of Echocardiogram examinations, most                 recent 04/16/2021. Risk Factors:Dyslipidemia and Diabetes. S/P                 TAVR with 29 mm CoreValve-Evolut Pro prosthetic (TAVR) valve                 present in the aortic position. CKD.  Sonographer:    Clayton Lefort RDCS (AE) Referring Phys: Sherryll Burger [facility] IMPRESSIONS  1. Left ventricular ejection fraction, by estimation, is 60 to 65%. The left ventricle has normal function. The left ventricle has no regional wall motion abnormalities. Left ventricular diastolic parameters are indeterminate. Elevated left ventricular end-diastolic pressure.  2. Right ventricular systolic function is normal. The right ventricular size is normal. There is mildly elevated pulmonary artery systolic pressure. The estimated right ventricular systolic pressure is 123456 mmHg.  3. The mitral valve is normal in structure. Mild mitral valve regurgitation. No evidence of mitral stenosis.  4. The aortic valve has been repaired/replaced. There is a 29 mm CoreValve-Evolut Pro prosthetic, stented (TAVR) valve present in the aortic position.     Aortic valve regurgitation is not visualized. No aortic stenosis is present. Aortic valve area, by VTI measures 3.48 cm. Aortic valve mean gradient measures 5.0 mmHg. Aortic valve Vmax measures 1.46 m/s.  5. The inferior vena cava is dilated in size with >50% respiratory variability, suggesting right atrial pressure of 8 mmHg. Conclusion(s)/Recommendation(s): No intracardiac source of embolism detected on this transthoracic study. Consider a transesophageal echocardiogram to exclude cardiac source of embolism if clinically indicated. FINDINGS  Left Ventricle: Left ventricular ejection fraction, by estimation, is 60 to 65%. The left ventricle has normal function. The left ventricle has no regional wall motion abnormalities. The left ventricular internal cavity size was normal in size. There is  no left ventricular  hypertrophy. Left ventricular diastolic parameters are indeterminate. Elevated left ventricular end-diastolic pressure. Right Ventricle: The right ventricular size is  normal. No increase in right ventricular wall thickness. Right ventricular systolic function is normal. There is mildly elevated pulmonary artery systolic pressure. The tricuspid regurgitant velocity is 2.69  m/s, and with an assumed right atrial pressure of 8 mmHg, the estimated right ventricular systolic pressure is 123456 mmHg. Left Atrium: Left atrial size was normal in size. Right Atrium: Right atrial size was normal in size. Pericardium: There is no evidence of pericardial effusion. Mitral Valve: The mitral valve is normal in structure. Mild to moderate mitral annular calcification. Mild mitral valve regurgitation. No evidence of mitral valve stenosis. Tricuspid Valve: The tricuspid valve is normal in structure. Tricuspid valve regurgitation is mild . No evidence of tricuspid stenosis. Aortic Valve: The aortic valve has been repaired/replaced. Aortic valve regurgitation is not visualized. No aortic stenosis is present. Aortic valve mean gradient measures 5.0 mmHg. Aortic valve peak gradient measures 8.5 mmHg. Aortic valve area, by VTI measures 3.48 cm. There is a 29 mm CoreValve-Evolut Pro prosthetic, stented (TAVR) valve present in the aortic position. Pulmonic Valve: The pulmonic valve was normal in structure. Pulmonic valve regurgitation is trivial. No evidence of pulmonic stenosis. Aorta: The aortic root is normal in size and structure. Venous: The inferior vena cava is dilated in size with greater than 50% respiratory variability, suggesting right atrial pressure of 8 mmHg. IAS/Shunts: No atrial level shunt detected by color flow Doppler. Agitated saline contrast was given intravenously to evaluate for intracardiac shunting.  LEFT VENTRICLE PLAX 2D LVIDd:         4.20 cm   Diastology LVIDs:         2.90 cm   LV e' medial:    6.20 cm/s LV PW:          0.90 cm   LV E/e' medial:  17.1 LV IVS:        0.90 cm   LV e' lateral:   7.51 cm/s LVOT diam:     2.70 cm   LV E/e' lateral: 14.1 LV SV:         100 LV SV Index:   53 LVOT Area:     5.73 cm  RIGHT VENTRICLE             IVC RV Basal diam:  3.10 cm     IVC diam: 2.30 cm RV S prime:     14.50 cm/s TAPSE (M-mode): 2.2 cm LEFT ATRIUM             Index        RIGHT ATRIUM           Index LA diam:        2.40 cm 1.27 cm/m   RA Area:     13.20 cm LA Vol (A2C):   48.9 ml 25.96 ml/m  RA Volume:   31.40 ml  16.67 ml/m LA Vol (A4C):   45.6 ml 24.21 ml/m LA Biplane Vol: 52.0 ml 27.60 ml/m  AORTIC VALVE AV Area (Vmax):    3.53 cm AV Area (Vmean):   3.74 cm AV Area (VTI):     3.48 cm AV Vmax:           146.00 cm/s AV Vmean:          98.000 cm/s AV VTI:            0.288 m AV Peak Grad:      8.5 mmHg AV Mean Grad:      5.0 mmHg LVOT Vmax:  89.90 cm/s LVOT Vmean:        64.000 cm/s LVOT VTI:          0.175 m LVOT/AV VTI ratio: 0.61  AORTA Ao Root diam: 2.50 cm Ao Asc diam:  3.00 cm MITRAL VALVE                TRICUSPID VALVE MV Area (PHT): 2.87 cm     TR Peak grad:   28.9 mmHg MV Decel Time: 264 msec     TR Vmax:        269.00 cm/s MV E velocity: 106.00 cm/s MV A velocity: 97.10 cm/s   SHUNTS MV E/A ratio:  1.09         Systemic VTI:  0.18 m                             Systemic Diam: 2.70 cm Fransico Him MD Electronically signed by Fransico Him MD Signature Date/Time: 09/24/2021/4:53:02 PM    Final      Scheduled Meds:  aspirin EC  81 mg Oral Daily   atorvastatin  10 mg Oral QHS   buPROPion  300 mg Oral QPC breakfast   clopidogrel  75 mg Oral Daily   DULoxetine  90 mg Oral QPC breakfast   enoxaparin (LOVENOX) injection  40 mg Subcutaneous Q24H   gabapentin  300 mg Oral TID   lidocaine  1 patch Transdermal Q24H   metoprolol succinate  25 mg Oral Daily   sodium chloride flush  3 mL Intravenous Q12H   Continuous Infusions:     LOS: 4 days    Flora Lipps, MD [facility]

## 2021-09-26 NOTE — Discharge Instructions (Signed)
Psychiatry instructions:   As far as we can tell, the hallucinations were because of delirium (confusion in the setting of dementia). We have stopped the [name] you were on because of the tardive dyskinesia. This sometimes makes TD worse before it gets better- there are medications that can help if it is not better in the next couple of weeks. Since this medication was prescribed for depression, there is a chance that your depression worsens - it is important that you keep your follow up appointment both with your psychiatrist in October and with the geriatrician on the 17th.   If you have any suicidal thoughts, or other psychiatric emergency, do not hesitate to call [phone] or go to the nearest emergency room.

## 2021-09-26 NOTE — Plan of Care (Signed)
  Problem: Activity: Goal: Risk for activity intolerance will decrease Outcome: Progressing   Problem: Nutrition: Goal: Adequate nutrition will be maintained Outcome: Progressing   Problem: Pain Managment: Goal: General experience of comfort will improve Outcome: Progressing   

## 2021-09-27 ENCOUNTER — Telehealth (HOSPITAL_COMMUNITY): Payer: Self-pay | Admitting: Pharmacy Technician

## 2021-09-27 ENCOUNTER — Other Ambulatory Visit (HOSPITAL_COMMUNITY): Payer: Self-pay

## 2021-09-27 DIAGNOSIS — G9341 Metabolic encephalopathy: Secondary | ICD-10-CM | POA: Diagnosis not present

## 2021-09-27 DIAGNOSIS — D638 Anemia in other chronic diseases classified elsewhere: Secondary | ICD-10-CM | POA: Diagnosis not present

## 2021-09-27 DIAGNOSIS — N1832 Chronic kidney disease, stage 3b: Secondary | ICD-10-CM | POA: Diagnosis not present

## 2021-09-27 DIAGNOSIS — F329 Major depressive disorder, single episode, unspecified: Secondary | ICD-10-CM | POA: Diagnosis not present

## 2021-09-27 LAB — BASIC METABOLIC PANEL
Anion gap: 5 (ref 5–15)
BUN: 17 mg/dL (ref 8–23)
CO2: 22 mmol/L (ref 22–32)
Calcium: 8.2 mg/dL — ABNORMAL LOW (ref 8.9–10.3)
Chloride: 111 mmol/L (ref 98–111)
Creatinine, Ser: 1.16 mg/dL — ABNORMAL HIGH (ref 0.44–1.00)
GFR, Estimated: 48 mL/min — ABNORMAL LOW (ref >60)
Glucose, Bld: 91 mg/dL (ref 70–99)
Potassium: 4.4 mmol/L (ref 3.5–5.1)
Sodium: 138 mmol/L (ref 135–145)

## 2021-09-27 LAB — CBC
HCT: 33.3 % — ABNORMAL LOW (ref 36.0–46.0)
Hemoglobin: 10.5 g/dL — ABNORMAL LOW (ref 12.0–15.0)
MCH: 30.8 pg (ref 26.0–34.0)
MCHC: 31.5 g/dL (ref 30.0–36.0)
MCV: 97.7 fL (ref 80.0–100.0)
Platelets: 260 10*3/uL (ref 150–400)
RBC: 3.41 MIL/uL — ABNORMAL LOW (ref 3.87–5.11)
RDW: 14.3 % (ref 11.5–15.5)
WBC: 5.9 10*3/uL (ref 4.0–10.5)
nRBC: 0 % (ref 0.0–0.2)

## 2021-09-27 LAB — GLUCOSE, CAPILLARY
Glucose-Capillary: 94 mg/dL (ref 70–99)
Glucose-Capillary: 98 mg/dL (ref 70–99)

## 2021-09-27 LAB — MAGNESIUM: Magnesium: 1.9 mg/dL (ref 1.7–2.4)

## 2021-09-27 MED ORDER — LIDOCAINE 5 % EX PTCH: 1.0000 | MEDICATED_PATCH | CUTANEOUS | 0 refills | Status: DC

## 2021-09-27 NOTE — Progress Notes (Signed)
Occupational Therapy Treatment Patient Details Name: Nicole Graves MRN: [id number] DOB: 05-01-1945 Today's Date: 09/27/2021   History of present illness 76 yo female presents to ED on 8/9 with falls, hallucinations, decreased intake. workup for AME, vs deficits from CVA. 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   OT comments  Pt progressing towards established OT goals. Pt standing at sink grooming hair on arrival. Pt performing tub/shower transfer in preparation for discharge with min guard A. Educating pt to have daughter present when performing tub transfer at home. Providing written BUE HEP this session to challenge strength and coordination. Pt performing with min cues for technique/proper body mechanics. Pt following up to 3 step commands with increased time this session, however, cues during HEP for new learning. Continue to recommend discharge home with OP OT to optimize safety and independence with ADL and IADL.    Recommendations for follow up therapy are one component of a multi-disciplinary discharge planning process, led by the attending physician.  Recommendations may be updated based on patient status, additional functional criteria and insurance authorization.    Follow Up Recommendations  Outpatient OT    Assistance Recommended at Discharge Frequent or constant Supervision/Assistance  Patient can return home with the following  A little help with walking and/or transfers;A little help with bathing/dressing/bathroom;Assistance with cooking/housework;Direct supervision/assist for medications management;Direct supervision/assist for financial management;Assist for transportation;Help with stairs or ramp for entrance   Equipment Recommendations  None  recommended by OT (Pt has recommended equipment)    Recommendations for Other Services      Precautions / Restrictions Precautions Precautions: Fall Restrictions Weight Bearing Restrictions: No       Mobility Bed Mobility               General bed mobility comments: Standing in room on arrival    Transfers Overall transfer level: Needs assistance Equipment used: None Transfers: Sit to/from Stand Sit to Stand: Supervision           General transfer comment: Supervision for safety. Rocking to rise. Min guard A on initial rise     Balance Overall balance assessment: Needs assistance Sitting-balance support: Feet supported, No upper extremity supported Sitting balance-Leahy Scale: Good Sitting balance - Comments: Able to don socks seated   Standing balance support: During functional activity, No upper extremity supported Standing balance-Leahy Scale: Fair Standing balance comment: Standing without support at sink on arrival, and supervision for functional mobility                           ADL either performed or assessed with clinical judgement   ADL Overall ADL's : Needs assistance/impaired     Grooming: Brushing hair;Standing;Supervision/safety;Modified independent Grooming Details (indicate cue type and reason): Brushing hair standing by sink on arrival with supervision once OT arrived.                         Tub/ Shower Transfer: Tub transfer;Min guard;Ambulation Tub/Shower Transfer Details (indicate cue type and reason): Supporting self with BUE on wall during shower transfer in rehabilitation gym. Pt with decreased balance during bringing LLE into tub. Min guard A. No physical assist required. Min difficulty bringing LLE over edge of tub. Functional mobility  during ADLs: Supervision/safety General ADL Comments: Pt performing at mod I -supervision level for UB and LB ADL. Pt requiring min guard A for tub/shower transfer due to  decreased balance bring LLE into tub. Recommening to pt to have daughter present for first transfer into tub on arrival home    Extremity/Trunk Assessment Upper Extremity Assessment Upper Extremity Assessment: Generalized weakness;RUE deficits/detail;LUE deficits/detail RUE Deficits / Details: Pt with BUE tremors (better today), and decreased corodination with finger opposition (L less coordinated than R); RUE generalized wealness 4/5 shoulder flexion/push/pull/grip; decreased shoulder ROM ~140 degrees RUE Coordination: decreased fine motor;decreased gross motor LUE Deficits / Details: Pt with BUE tremors (better today), and decreased corodination with finger opposition (L less coordinated than R); LUE generalized weakness 4/5 push/pull/grip; L shoulder decreased ROM, and pt reporting that shoulder has been receivig injections due to bone on bone joint. decreased shoulder ROM ~140 degrees LUE Coordination: decreased fine motor;decreased gross motor   Lower Extremity Assessment Lower Extremity Assessment: Defer to PT evaluation        Vision   Vision Assessment?: No apparent visual deficits Additional Comments: No undershooting obsereved this session. Vision [facility] for tasks performed   Perception Perception Perception: Not tested   Praxis Praxis Praxis: Not tested    Cognition Arousal/Alertness: Awake/alert Behavior During Therapy: WFL for tasks assessed/performed Overall Cognitive Status: Impaired/Different from baseline Area of Impairment: Memory, Following commands, Problem solving                   Current Attention Level: Sustained   Following Commands: Follows one step commands consistently, Follows one step commands with increased time, Follows multi-step commands inconsistently     Problem Solving: Slow processing, Difficulty sequencing, Requires verbal cues General Comments: Pt requiring verbal/tactile cues for problem solving to achieve correct movement when  provided HEP. Pt requiring min cues to follow multistep directions during novel task, however, following 3 step path finding command with no verbal cues. Asking appriopriate questions        Exercises Exercises: Other exercises Other Exercises Other Exercises: written HEP provided. Scapular retraction with BUE 10x; elbow flexion 10x. Finger opposition 10x, composite flexion 10x. Pt educated and demonstrating during session.    Shoulder Instructions       General Comments VSS. Pt reporting home today.    Pertinent Vitals/ Pain       Pain Assessment Pain Assessment: No/denies pain  Home Living                                          Prior Functioning/Environment              Frequency  Min 2X/week        Progress Toward Goals  OT Goals(current goals can now be found in the care plan section)  Progress towards OT goals: Progressing toward goals  Acute Rehab OT Goals Patient Stated Goal: Go home today OT Goal Formulation: With patient Time For Goal Achievement: 10/11/21 Potential to Achieve Goals: Good ADL Goals Pt Will Perform Grooming: with modified independence;standing Pt Will Perform Lower Body Dressing: with modified independence;sit to/from stand Pt Will Transfer to Toilet: with modified independence;ambulating;regular height toilet Pt Will Perform Toileting - Clothing Manipulation and hygiene: with modified independence;sit to/from stand Pt Will Perform Tub/Shower Transfer: Tub transfer;3 in 1;ambulating Pt/caregiver will Perform Home Exercise Program: Left upper extremity;Both right and left upper extremity;Increased  strength;Increased ROM  Plan Discharge plan remains appropriate    Co-evaluation                 AM-PAC OT "6 Clicks" Daily Activity     Outcome Measure   Help from another person eating meals?: None Help from another person taking care of personal grooming?: A Little Help from another person toileting, which  includes using toliet, bedpan, or urinal?: A Little Help from another person bathing (including washing, rinsing, drying)?: A Little Help from another person to put on and taking off regular upper body clothing?: None Help from another person to put on and taking off regular lower body clothing?: A Little 6 Click Score: 20    End of Session Equipment Utilized During Treatment: Gait belt  OT Visit Diagnosis: Unsteadiness on feet (R26.81);Muscle weakness (generalized) (M62.81);History of falling (Z91.81);Other symptoms and signs involving cognitive function   Activity Tolerance Patient tolerated treatment well   Patient Left in chair;Other (comment) (MD in room)   Nurse Communication Mobility status        Time: [id number] OT Time Calculation (min): 20 min  Charges: OT General Charges $OT Visit: 1 Visit OT Treatments $Self Care/Home Management : 8-22 mins  Ladene Artist, OTR/L [facility] Acute Rehabilitation Office: [phone]   Drue Novel 09/27/2021, 11:13 AM

## 2021-09-27 NOTE — Progress Notes (Signed)
Physical Therapy Treatment Patient Details Name: Nicole Graves MRN: [id number] DOB: 04/12/1945 Today's Date: 09/27/2021   History of Present Illness 76 yo female presents to ED on 8/9 with falls, hallucinations, decreased intake. workup for AME, vs deficits from CVA. 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    PT Comments    Excellent progression. Pt safely navigating at supervision level, educated on [facility] use for additional stability since pt reports 3 falls in the past 6 months. Agreeable to use and OPPT for further balance and gait training. Safely navigates stairs with supervision similar to home set-up. Adequate for d/c from acute PT standpoint when medically cleared. Will follow and progress further until d/c.   Recommendations for follow up therapy are one component of a multi-disciplinary discharge planning process, led by the attending physician.  Recommendations may be updated based on patient status, additional functional criteria and insurance authorization.  Follow Up Recommendations  Outpatient PT     Assistance Recommended at Discharge Intermittent Supervision/Assistance  Patient can return home with the following A little help with bathing/dressing/bathroom;Assistance with cooking/housework;Assist for transportation;Help with stairs or ramp for entrance   Equipment Recommendations  None recommended by PT (pt has SPC and will use before OPPT)    Recommendations for Other Services       Precautions / Restrictions Precautions Precautions: Fall Restrictions Weight Bearing Restrictions: No     Mobility  Bed Mobility Overal bed mobility: Needs Assistance, Modified Independent             General bed mobility comments: No assist, extra  time    Transfers Overall transfer level: Needs assistance Equipment used: None, Straight cane Transfers: Sit to/from Stand Sit to Stand: Supervision           General transfer comment: Supervision for safety. Cues for technique. Pt rocks for momentum to rise from EOB. Practiced without AD and with SPC, showing slightly improved stability upon rising with SPC.    Ambulation/Gait Ambulation/Gait assistance: Supervision Gait Distance (Feet): 250 Feet Assistive device: None, Straight cane Gait Pattern/deviations: Step-through pattern, Narrow base of support, Drifts right/left Gait velocity: decreased Gait velocity interpretation: <1.8 ft/sec, indicate of risk for recurrent falls   General Gait Details: Pt amb initial bout with no AD, showing slight drift and occasional scissoring when turning but without overt LOB. Cues for awareness and symmetry of gait. WIth SPC pt educated on appropriate use, able to demonstrate improved control with turns, more stable, less drift. Pt agreeable to use.   Stairs Stairs: Yes Stairs assistance: Supervision Stair Management: One rail Right, Forwards Number of Stairs: 5 (x2) General stair comments: Educated on stair navigation simulating home set-up, completed without issues, supervision for safety.   Wheelchair Mobility    Modified Rankin (Stroke Patients Only) Modified Rankin (Stroke Patients Only) Pre-Morbid Rankin Score: No symptoms Modified Rankin: Moderately severe disability     Balance Overall balance assessment: Needs assistance Sitting-balance support: Feet supported, No upper extremity supported Sitting balance-Leahy Scale: Good     Standing balance support: During functional activity, No upper extremity supported Standing balance-Leahy Scale: Fair                              Cognition Arousal/Alertness: Awake/alert Behavior  During Therapy: WFL for tasks assessed/performed Overall Cognitive Status: Within  Functional Limits for tasks assessed                                          Exercises      General Comments        Pertinent Vitals/Pain Pain Assessment Pain Assessment: No/denies pain    Home Living                          Prior Function            PT Goals (current goals can now be found in the care plan section) Acute Rehab PT Goals PT Goal Formulation: With patient Time For Goal Achievement: 10/07/21 Potential to Achieve Goals: Good Progress towards PT goals: Progressing toward goals    Frequency    Min 3X/week      PT Plan Current plan remains appropriate    Co-evaluation              AM-PAC PT "6 Clicks" Mobility   Outcome Measure  Help needed turning from your back to your side while in a flat bed without using bedrails?: None Help needed moving from lying on your back to sitting on the side of a flat bed without using bedrails?: None Help needed moving to and from a bed to a chair (including a wheelchair)?: None Help needed standing up from a chair using your arms (e.g., wheelchair or bedside chair)?: A Little Help needed to walk in hospital room?: A Little Help needed climbing 3-5 steps with a railing? : A Little 6 Click Score: 21    End of Session   Activity Tolerance: Patient tolerated treatment well Patient left: in bed;with call bell/phone within reach;with bed alarm set   PT Visit Diagnosis: Other abnormalities of gait and mobility (R26.89);Muscle weakness (generalized) (M62.81)     Time: 1610-9604 PT Time Calculation (min) (ACUTE ONLY): 10 min  Charges:  $Gait Training: 8-22 mins                     Kathlyn [place], PT    Nicole Graves 09/27/2021, 8:56 AM

## 2021-09-27 NOTE — Telephone Encounter (Signed)
Pharmacy Patient Advocate Encounter  Insurance verification completed.    The patient is insured through [employer]   The patient is currently admitted and ran test claims for the following: Ingrezza 40 mg capsules..  Copays and coinsurance results were relayed to Inpatient clinical team.

## 2021-09-27 NOTE — Discharge Summary (Signed)
Physician Discharge Summary   Patient: Nicole Graves MRN: [id number] DOB: April 03, 1945  Admit date:     09/22/2021  Discharge date: 09/27/21  Discharge Physician: Flora Lipps   PCP: Kathyrn Lass, MD   Recommendations at discharge:   Follow-up with your primary care physician in 1 week.   Check CBC BMP magnesium phosphorus in the next visit. Anette Guarneri has been discontinued due to tardive dyskinesia.  Will need to follow-up with psychiatry as outpatient.  Discharge Diagnoses: Active Problems:   Acute metabolic encephalopathy   Chronic kidney disease, stage 3b (HCC)   Type 2 diabetes mellitus with stage 3b chronic kidney disease, without long-term current use of insulin (HCC)   Paroxysmal atrial tachycardia (HCC)   Anemia of chronic disease   Mixed hyperlipidemia due to type 2 diabetes mellitus ([place])   Major depressive disorder  Resolved Problems:   * No resolved hospital problems. *  Hospital Course: Nicole Graves a76 year old female with past medical history significant for severe aortic stenosis status post TAVR 02/2021, CKD 3b, paroxysmal atrial tachycardia, major depressive disorder, presented to the hospital for evaluation of hallucination and confusion.  Per family since patient was diagnosed with COVID-19 in late June she has exhibited a gradual decline.  She had progressive worsening weakness and decreased appetite.  Over the last several days, she has exhibited episode of anxiety with hallucination.  In the ED MRI was done revealing right frontal centrum semiovale acute to subacute infarct that was thought to be an artifact.  Patient was admitted with further neurological work-up. Working on different differential diagnosis including psychiatric outbursts/early dementia related hallucinations.  On 8/12, psychiatry consulted on family request and neurology recommendations.  Following conditions were addressed during hospitalization,  Acute metabolic encephalopathy/hallucinations: Improved  at this time.Marland Kitchen MRI of the brain with 4 mm focus right frontal centrum semiovale, this was thought to be artifact without any clinical significance.  Acute stroke ruled out. Ammonia, 14. TSH, 1.6. RPR, non-reactive.  Vitamin B12 at 400.  Urinalysis without concern for infection.  EEG without any focal seizures.  Neurology recommended outpatient follow-up for dementia work-up.  Avoid benzodiazepines.  No further hallucinations.  Advise outpatient follow-up with PCP for further dementia work-up.   Major depressive disorder/concern for tardive dyskinesia. Patient was multiple antidepressants including Cymbalta, Wellbutrin as well as [name] as outpatient..  Followed by outpatient psychiatry.  Psychiatry was consulted during hospitalization.  Flexeril tramadol and [name] has been discontinued at this time.  Anette Guarneri has been discontinued due to concern for tardive dyskinesia.  Patient was advised to follow-up with psychiatry as outpatient.  Paroxysmal atrial tachycardia: Rate controlled, continue metoprolol.   Hypomagnesemia/hypokalemia: Resolved after replacement.  CKD stage IIIb: At about baseline.     Obesity.  Takes metformin for weight loss.  Hemoglobin A1c of 4.6.   Impaired mobility.  Physical therapy has recommended home health PT, OT on discharge.  Consultants:  Neurology Psychiatry  Procedures performed:  None  Disposition: Home with home health.  Diet recommendation:  Discharge Diet Orders (From admission, onward)     Start     Ordered   09/27/21 0000  Diet general        09/27/21 1111           Regular diet DISCHARGE MEDICATION: Allergies as of 09/27/2021       Reactions   Sulfa Antibiotics Nausea And Vomiting, Other (See Comments)   Ativan [lorazepam] Other (See Comments)   Hyperactive    Tizanidine Hcl Other (See Comments)  Pt does not remember         Medication List     STOP taking these medications    cyclobenzaprine 10 MG tablet Commonly known as:  FLEXERIL   Lurasidone HCl 60 MG Tabs   traMADol 50 MG tablet Commonly known as: ULTRAM       TAKE these medications    acetaminophen 650 MG CR tablet Commonly known as: TYLENOL Take 1,300 mg by mouth in the morning and at bedtime.   alendronate 70 MG tablet Commonly known as: FOSAMAX Take 1 tablet by mouth every Thursday.   aspirin EC 81 MG tablet Take 81 mg by mouth daily. Swallow whole.   atorvastatin 10 MG tablet Commonly known as: LIPITOR Take 10 mg by mouth at bedtime.   buPROPion 300 MG 24 hr tablet Commonly known as: WELLBUTRIN XL Take 300 mg by mouth daily after breakfast.   calcium carbonate 600 MG Tabs tablet Commonly known as: OS-CAL Take 600 mg by mouth 2 (two) times daily with a meal.   cholecalciferol 25 MCG (1000 UNIT) tablet Commonly known as: VITAMIN D3 Take 1,000 Units by mouth in the morning and at bedtime.   cyanocobalamin 1000 MCG tablet Commonly known as: VITAMIN B12 Take 1,000 mcg by mouth daily.   DULoxetine 30 MG capsule Commonly known as: CYMBALTA Take 90 mg by mouth daily after breakfast.   famotidine 20 MG tablet Commonly known as: PEPCID Take 20 mg by mouth daily.   gabapentin 300 MG capsule Commonly known as: NEURONTIN Take 300 mg by mouth 3 (three) times daily.   lidocaine 5 % Commonly known as: LIDODERM Place 1 patch onto the skin daily. Remove & Discard patch within 12 hours or as directed by MD. Apply to site of pain   metFORMIN 500 MG tablet Commonly known as: GLUCOPHAGE Take 500 mg by mouth 2 (two) times daily.   metoprolol succinate 25 MG 24 hr tablet Commonly known as: Toprol XL Take 1 tablet (25 mg total) by mouth in the morning.        Follow-up Information     [facility] Follow up.   Specialty: Rehabilitation Why: please call them to set up your apt times. Contact information: [address] [id number] mc [place] [address] [phone]               Subjective Today, patient was seen and examined at bedside.  Feels better.  Walking in the hallway.  Discharge Exam: Filed Weights   09/22/21 1823  Weight: 83 kg      09/27/2021   11:27 AM 09/27/2021    7:00 AM 09/27/2021    5:00 AM  Vitals with BMI  Systolic 108 136 149  Diastolic 70 74 66  Pulse 91 80 85    General: Obese built, not in obvious distress HENT:   No scleral pallor or icterus noted. Oral mucosa is moist.  Chest:  Clear breath sounds.  Diminished breath sounds bilaterally. No crackles or wheezes.  CVS: S1 &S2 heard. No murmur.  Regular rate and rhythm. Abdomen: Soft, nontender, nondistended.  Bowel sounds are heard.   Extremities: No cyanosis, clubbing or edema.  Peripheral pulses are palpable. Psych: Alert, awake and oriented, normal mood CNS:  No cranial nerve deficits.  Generalized weakness. Skin: Warm and dry.  No rashes noted.  Condition at discharge: good  The results of significant diagnostics from this hospitalization (including imaging, microbiology, ancillary and laboratory) are listed below for reference.  Imaging Studies: ECHOCARDIOGRAM COMPLETE BUBBLE STUDY  Result Date: 09/24/2021    ECHOCARDIOGRAM REPORT   Patient Name:   AMYE KUPFERMAN Date of Exam: 09/24/2021 Medical Rec #:  [id number]    Height:       64.0 in Accession #:    [id number]   Weight:       183.0 lb Date of Birth:  07/23/1945    BSA:          1.884 m Patient Age:    1 years     BP:           131/85 mmHg Patient Gender: F            HR:           88 bpm. Exam Location:  Inpatient Procedure: 2D Echo, Cardiac Doppler, Color Doppler and Saline Contrast Bubble            Study Indications:    Stroke  History:        Patient has prior history of Echocardiogram examinations, most                 recent 04/16/2021. Risk Factors:Dyslipidemia and Diabetes. S/P                 TAVR with 29 mm CoreValve-Evolut Pro prosthetic (TAVR) valve                 present in the aortic position. CKD.   Sonographer:    Clayton Lefort RDCS (AE) Referring Phys: Sherryll Burger [facility] IMPRESSIONS  1. Left ventricular ejection fraction, by estimation, is 60 to 65%. The left ventricle has normal function. The left ventricle has no regional wall motion abnormalities. Left ventricular diastolic parameters are indeterminate. Elevated left ventricular end-diastolic pressure.  2. Right ventricular systolic function is normal. The right ventricular size is normal. There is mildly elevated pulmonary artery systolic pressure. The estimated right ventricular systolic pressure is 123456 mmHg.  3. The mitral valve is normal in structure. Mild mitral valve regurgitation. No evidence of mitral stenosis.  4. The aortic valve has been repaired/replaced. There is a 29 mm CoreValve-Evolut Pro prosthetic, stented (TAVR) valve present in the aortic position.     Aortic valve regurgitation is not visualized. No aortic stenosis is present. Aortic valve area, by VTI measures 3.48 cm. Aortic valve mean gradient measures 5.0 mmHg. Aortic valve Vmax measures 1.46 m/s.  5. The inferior vena cava is dilated in size with >50% respiratory variability, suggesting right atrial pressure of 8 mmHg. Conclusion(s)/Recommendation(s): No intracardiac source of embolism detected on this transthoracic study. Consider a transesophageal echocardiogram to exclude cardiac source of embolism if clinically indicated. FINDINGS  Left Ventricle: Left ventricular ejection fraction, by estimation, is 60 to 65%. The left ventricle has normal function. The left ventricle has no regional wall motion abnormalities. The left ventricular internal cavity size was normal in size. There is  no left ventricular hypertrophy. Left ventricular diastolic parameters are indeterminate. Elevated left ventricular end-diastolic pressure. Right Ventricle: The right ventricular size is normal. No increase in right ventricular wall thickness. Right ventricular systolic function is normal. There is  mildly elevated pulmonary artery systolic pressure. The tricuspid regurgitant velocity is 2.69  m/s, and with an assumed right atrial pressure of 8 mmHg, the estimated right ventricular systolic pressure is 123456 mmHg. Left Atrium: Left atrial size was normal in size. Right Atrium: Right atrial size was normal in size. Pericardium: There is no evidence of pericardial effusion.  Mitral Valve: The mitral valve is normal in structure. Mild to moderate mitral annular calcification. Mild mitral valve regurgitation. No evidence of mitral valve stenosis. Tricuspid Valve: The tricuspid valve is normal in structure. Tricuspid valve regurgitation is mild . No evidence of tricuspid stenosis. Aortic Valve: The aortic valve has been repaired/replaced. Aortic valve regurgitation is not visualized. No aortic stenosis is present. Aortic valve mean gradient measures 5.0 mmHg. Aortic valve peak gradient measures 8.5 mmHg. Aortic valve area, by VTI measures 3.48 cm. There is a 29 mm CoreValve-Evolut Pro prosthetic, stented (TAVR) valve present in the aortic position. Pulmonic Valve: The pulmonic valve was normal in structure. Pulmonic valve regurgitation is trivial. No evidence of pulmonic stenosis. Aorta: The aortic root is normal in size and structure. Venous: The inferior vena cava is dilated in size with greater than 50% respiratory variability, suggesting right atrial pressure of 8 mmHg. IAS/Shunts: No atrial level shunt detected by color flow Doppler. Agitated saline contrast was given intravenously to evaluate for intracardiac shunting.  LEFT VENTRICLE PLAX 2D LVIDd:         4.20 cm   Diastology LVIDs:         2.90 cm   LV e' medial:    6.20 cm/s LV PW:         0.90 cm   LV E/e' medial:  17.1 LV IVS:        0.90 cm   LV e' lateral:   7.51 cm/s LVOT diam:     2.70 cm   LV E/e' lateral: 14.1 LV SV:         100 LV SV Index:   53 LVOT Area:     5.73 cm  RIGHT VENTRICLE             IVC RV Basal diam:  3.10 cm     IVC diam: 2.30 cm  RV S prime:     14.50 cm/s TAPSE (M-mode): 2.2 cm LEFT ATRIUM             Index        RIGHT ATRIUM           Index LA diam:        2.40 cm 1.27 cm/m   RA Area:     13.20 cm LA Vol (A2C):   48.9 ml 25.96 ml/m  RA Volume:   31.40 ml  16.67 ml/m LA Vol (A4C):   45.6 ml 24.21 ml/m LA Biplane Vol: 52.0 ml 27.60 ml/m  AORTIC VALVE AV Area (Vmax):    3.53 cm AV Area (Vmean):   3.74 cm AV Area (VTI):     3.48 cm AV Vmax:           146.00 cm/s AV Vmean:          98.000 cm/s AV VTI:            0.288 m AV Peak Grad:      8.5 mmHg AV Mean Grad:      5.0 mmHg LVOT Vmax:         89.90 cm/s LVOT Vmean:        64.000 cm/s LVOT VTI:          0.175 m LVOT/AV VTI ratio: 0.61  AORTA Ao Root diam: 2.50 cm Ao Asc diam:  3.00 cm MITRAL VALVE                TRICUSPID VALVE MV Area (PHT): 2.87 cm     TR  Peak grad:   28.9 mmHg MV Decel Time: 264 msec     TR Vmax:        269.00 cm/s MV E velocity: 106.00 cm/s MV A velocity: 97.10 cm/s   SHUNTS MV E/A ratio:  1.09         Systemic VTI:  0.18 m                             Systemic Diam: 2.70 cm Fransico Him MD Electronically signed by Fransico Him MD Signature Date/Time: 09/24/2021/4:53:02 PM    Final    EEG adult  Result Date: 09/23/2021 Lora Havens, MD     09/23/2021  6:58 PM Patient Name: Nicole Graves MRN: [id number] Epilepsy Attending: Lora Havens Referring Physician/Provider: Katy Apo, NP Date: 09/23/2021 Duration: 22.23 mins Patient history: 76yo F with ams. EEG to evaluate for seizure Level of alertness: Awake AEDs during EEG study: None Technical aspects: This EEG study was done with scalp electrodes positioned according to the 10-20 International system of electrode placement. Electrical activity was reviewed with band pass filter of 1-70Hz , sensitivity of 7 uV/mm, display speed of 27mm/sec with a 60Hz  notched filter applied as appropriate. EEG data were recorded continuously and digitally stored.  Video monitoring was available and reviewed as  appropriate. Description: No posterior dominant rhythm was seen. EEG showed continuous generalized 3 to 6 Hz theta-delta slowing.  Hyperventilation and photic stimulation were not performed.     ABNORMALITY - Continuous slow, generalized  IMPRESSION: This study is suggestive of moderate diffuse encephalopathy, nonspecific etiology. No seizures or epileptiform discharges were seen throughout the recording.   Priyanka Barbra Sarks    CT ANGIO HEAD W OR WO CONTRAST  Result Date: 09/23/2021 CLINICAL DATA:  Follow-up examination for stroke. EXAM: CT ANGIOGRAPHY HEAD AND NECK TECHNIQUE: Multidetector CT imaging of the head and neck was performed using the standard protocol during bolus administration of intravenous contrast. Multiplanar CT image reconstructions and MIPs were obtained to evaluate the vascular anatomy. Carotid stenosis measurements (when applicable) are obtained utilizing NASCET criteria, using the distal internal carotid diameter as the denominator. RADIATION DOSE REDUCTION: This exam was performed according to the departmental dose-optimization program which includes automated exposure control, adjustment of the mA and/or kV according to patient size and/or use of iterative reconstruction technique. CONTRAST:  44mL OMNIPAQUE IOHEXOL 350 MG/ML SOLN COMPARISON:  Prior studies from 09/22/2021. FINDINGS: CTA NECK FINDINGS Aortic arch: Examination degraded by motion artifact. Visualized aortic arch normal caliber with standard 3 vessel morphology. Mild atheromatous change about the arch itself. No stenosis about the origin the great vessels. Right carotid system: Right common and internal carotid arteries are tortuous but widely patent without stenosis or dissection. Left carotid system: Left common and internal carotid arteries are tortuous but widely patent without stenosis or dissection. Vertebral arteries: Both vertebral arteries arise from subclavian arteries. No proximal subclavian artery stenosis.  Vertebral arteries mildly tortuous bilaterally. Evaluation of the distal V3 segments somewhat limited by motion. Visualized vertebral arteries patent without stenosis or dissection. Skeleton: No discrete or worrisome osseous lesions. Moderate spondylosis present at C4-5 through C6-7. Degenerative changes noted about the left TMJ. Other neck: No other acute soft tissue abnormality within the neck. Upper chest: Visualized upper chest demonstrates no acute finding. Review of the MIP images confirms the above findings CTA HEAD FINDINGS Anterior circulation: Evaluation of the intracranial circulation limited by motion artifact and  patient positioning. Petrous segments patent bilaterally. Scattered atheromatous change within the carotid siphons without hemodynamically significant stenosis. A1 segments patent bilaterally. Right A1 hypoplastic. Normal anterior communicating artery complex. Both ACAs patent without visible stenosis. Left ACA dominant. No M1 stenosis or occlusion. No proximal MCA branch occlusion. Distal MCA branches perfused and symmetric. Posterior circulation: Both vertebral arteries patent without stenosis. Both PICA patent. Basilar patent to its distal aspect without stenosis. Superior cerebral arteries patent bilaterally. Both PCAs primarily supplied via the basilar well perfused or distal aspects. Venous sinuses: Grossly patent allowing for timing the contrast bolus and motion. Anatomic variants: None significant.  No aneurysm. Review of the MIP images confirms the above findings IMPRESSION: 1. Negative CTA for large vessel occlusion or other emergent finding. 2. Mild atheromatous change about the carotid siphons without hemodynamically significant or correctable stenosis. 3. Diffuse tortuosity about the major arterial vasculature of the head and neck, suggesting chronic underlying hypertension. Electronically Signed   By: Jeannine Boga M.D.   On: 09/23/2021 03:54   CT ANGIO NECK W OR WO  CONTRAST  Result Date: 09/23/2021 CLINICAL DATA:  Follow-up examination for stroke. EXAM: CT ANGIOGRAPHY HEAD AND NECK TECHNIQUE: Multidetector CT imaging of the head and neck was performed using the standard protocol during bolus administration of intravenous contrast. Multiplanar CT image reconstructions and MIPs were obtained to evaluate the vascular anatomy. Carotid stenosis measurements (when applicable) are obtained utilizing NASCET criteria, using the distal internal carotid diameter as the denominator. RADIATION DOSE REDUCTION: This exam was performed according to the departmental dose-optimization program which includes automated exposure control, adjustment of the mA and/or kV according to patient size and/or use of iterative reconstruction technique. CONTRAST:  48mL OMNIPAQUE IOHEXOL 350 MG/ML SOLN COMPARISON:  Prior studies from 09/22/2021. FINDINGS: CTA NECK FINDINGS Aortic arch: Examination degraded by motion artifact. Visualized aortic arch normal caliber with standard 3 vessel morphology. Mild atheromatous change about the arch itself. No stenosis about the origin the great vessels. Right carotid system: Right common and internal carotid arteries are tortuous but widely patent without stenosis or dissection. Left carotid system: Left common and internal carotid arteries are tortuous but widely patent without stenosis or dissection. Vertebral arteries: Both vertebral arteries arise from subclavian arteries. No proximal subclavian artery stenosis. Vertebral arteries mildly tortuous bilaterally. Evaluation of the distal V3 segments somewhat limited by motion. Visualized vertebral arteries patent without stenosis or dissection. Skeleton: No discrete or worrisome osseous lesions. Moderate spondylosis present at C4-5 through C6-7. Degenerative changes noted about the left TMJ. Other neck: No other acute soft tissue abnormality within the neck. Upper chest: Visualized upper chest demonstrates no acute  finding. Review of the MIP images confirms the above findings CTA HEAD FINDINGS Anterior circulation: Evaluation of the intracranial circulation limited by motion artifact and patient positioning. Petrous segments patent bilaterally. Scattered atheromatous change within the carotid siphons without hemodynamically significant stenosis. A1 segments patent bilaterally. Right A1 hypoplastic. Normal anterior communicating artery complex. Both ACAs patent without visible stenosis. Left ACA dominant. No M1 stenosis or occlusion. No proximal MCA branch occlusion. Distal MCA branches perfused and symmetric. Posterior circulation: Both vertebral arteries patent without stenosis. Both PICA patent. Basilar patent to its distal aspect without stenosis. Superior cerebral arteries patent bilaterally. Both PCAs primarily supplied via the basilar well perfused or distal aspects. Venous sinuses: Grossly patent allowing for timing the contrast bolus and motion. Anatomic variants: None significant.  No aneurysm. Review of the MIP images confirms the above findings IMPRESSION: 1. Negative CTA  for large vessel occlusion or other emergent finding. 2. Mild atheromatous change about the carotid siphons without hemodynamically significant or correctable stenosis. 3. Diffuse tortuosity about the major arterial vasculature of the head and neck, suggesting chronic underlying hypertension. Electronically Signed   By: Jeannine Boga M.D.   On: 09/23/2021 03:54   MR BRAIN WO CONTRAST  Result Date: 09/22/2021 CLINICAL DATA:  Initial evaluation for neuro deficit, stroke suspected. EXAM: MRI HEAD WITHOUT CONTRAST TECHNIQUE: Multiplanar, multiecho pulse sequences of the brain and surrounding structures were obtained without intravenous contrast. COMPARISON:  Prior CT from earlier the same day. FINDINGS: Brain: Examination degraded by motion artifact. Mild age-related cerebral atrophy. Patchy T2/FLAIR hyperintensity involving the  periventricular deep white matter both cerebral hemispheres, consistent with chronic small vessel ischemic disease, mild for age. Punctate 4 mm focus of diffusion signal abnormality involving the deep white matter of the right frontal centrum semi ovale, consistent with a small acute to early subacute ischemic infarct (series 12, image 41). No other convincing evidence for acute or subacute ischemia. Gray-white matter differentiation otherwise maintained. No visible areas of chronic cortical infarction. No visible acute or chronic intracranial blood products. No mass lesion, midline shift or mass effect. No hydrocephalus or extra-axial fluid collection. Pituitary gland and suprasellar region within normal limits. Vascular: Major intracranial vascular flow voids are maintained. Skull and upper cervical spine: Craniocervical junction within normal limits. Bone marrow signal intensity grossly normal. No scalp soft tissue abnormality. Sinuses/Orbits: Prior bilateral ocular lens replacement. Paranasal sinuses are largely clear. No significant mastoid effusion. Other: None. IMPRESSION: 1. 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 No associated hemorrhage. 2. No other acute intracranial abnormality. 3. 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 Electronically Signed   By: Jeannine Boga M.D.   On: 09/22/2021 23:02   CT Head Wo Contrast  Result Date: 09/22/2021 CLINICAL DATA:  Mental status change unknown cause. EXAM: CT HEAD WITHOUT CONTRAST TECHNIQUE: Contiguous axial images were obtained from the base of the skull through the vertex without intravenous contrast. RADIATION DOSE REDUCTION: This exam was performed according to the departmental dose-optimization program which includes automated exposure control, adjustment of the mA and/or kV according  to patient size and/or use of iterative reconstruction technique. COMPARISON:  None Available. FINDINGS: Brain: Mild atrophy. Negative for hydrocephalus. Negative for acute infarct, hemorrhage, mass. Mild white matter hypodensity consistent with small vessel ischemia Vascular: Negative for hyperdense vessel Skull: Negative Sinuses/Orbits: Paranasal sinuses clear. Bilateral cataract extraction Other: None. IMPRESSION: Mild atrophy and mild chronic microvascular ischemia. No acute intracranial abnormality. Electronically Signed   By: Franchot Gallo M.D.   On: 09/22/2021 16:18   DG Chest 2 View  Result Date: 09/22/2021 CLINICAL DATA:  Altered mental status EXAM: CHEST - 2 VIEW COMPARISON:  Chest x-ray dated August 10, 2021 FINDINGS: Cardiac and mediastinal contours are within normal limits. Status post transcatheter aortic valve replacement. Large hiatal hernia. Lungs are clear. No large pleural effusion or pneumothorax. 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 IMPRESSION: 1. Clear lungs. 2. Large hiatal hernia. Electronically Signed   By: Yetta Glassman M.D.   On: 09/22/2021 15:46    Microbiology: Results for orders placed or performed during the hospital encounter of 09/22/21  Urine Culture     Status: None   Collection Time: 09/22/21  5:16 PM   Specimen: Urine, Clean  Catch  Result Value Ref Range Status   Specimen Description   Final    URINE, CLEAN CATCH Performed at [facility], [address], [place], [place] [address]    Special Requests   Final    NONE Performed at [facility], [address] [place], [place] [address]    Culture   Final    NO GROWTH Performed at [facility], [address] [place], [place] [address]    Report Status 09/24/2021 FINAL  Final  Urine Culture     Status: Abnormal   Collection Time: 09/23/21  8:19 AM   Specimen: Urine, Clean Catch  Result Value Ref Range Status   Specimen Description  URINE, CLEAN CATCH  Final   Special Requests   Final    NONE Performed at [facility], [address], [place], [place] [address]    Culture MULTIPLE SPECIES PRESENT, SUGGEST RECOLLECTION (A)  Final   Report Status 09/24/2021 FINAL  Final    Labs: CBC: Recent Labs  Lab 09/22/21 1509 09/23/21 0455 09/27/21 0654  WBC 5.9 5.3 5.9  NEUTROABS 3.4 2.6  --   HGB 10.9* 9.7* 10.5*  HCT 33.9* 29.8* 33.3*  MCV 96.0 94.3 97.7  PLT 238 203 260   Basic Metabolic Panel: Recent Labs  Lab 09/22/21 1509 09/23/21 0455 09/24/21 0242 09/27/21 0654  NA 139 139 138 138  K 3.5 3.4* 3.8 4.4  CL 105 108 107 111  CO2 26 25 24 22   GLUCOSE 89 90 102* 91  BUN 28* 19 13 17   CREATININE 1.35* 1.18* 1.15* 1.16*  CALCIUM 10.3 9.1 9.1 8.2*  MG  --  1.3* 1.7 1.9  PHOS  --   --  2.9  --    Liver Function Tests: Recent Labs  Lab 09/22/21 1509 09/23/21 0455  AST 14* 12*  ALT 9 8  ALKPHOS 48 40  BILITOT 0.5 0.5  PROT 6.4* 5.3*  ALBUMIN 3.6 2.9*   CBG: Recent Labs  Lab 09/26/21 1239 09/26/21 1736 09/26/21 2040 09/27/21 0635 09/27/21 1128  GLUCAP 264* 98 98 94 98    Discharge time spent: greater than wert states that was supplied tomorrow to 30 minutes.  Signed: 09/29/21, MD [facility] 09/27/2021

## 2021-09-27 NOTE — TOC Transition Note (Signed)
Transition of Care [facility]) - CM/SW Discharge Note   Patient Details  Name: Nicole Graves MRN: [id number] Date of Birth: [birth year]  [facility] CM/SW Contact:  Kermit Balo, RN Phone Number: 09/27/2021, 11:19 AM   Clinical Narrative:    Pt is discharging home with outpatient therapy through [facility] rehab. Information on the AVS.  Pt has all needed DME at home.  Pt has transportation home today.   Final next level of care: OP Rehab Barriers to Discharge: No Barriers Identified   Patient Goals and CMS Choice Patient states their goals for this hospitalization and ongoing recovery are:: home with outpatient PT/OT   Choice offered to / list presented to : NA  Discharge Placement                       Discharge Plan and Services                  DME Agency: NA       HH Arranged: NA          Social Determinants of Health (SDOH) Interventions     Readmission Risk Interventions     No data to display

## 2021-09-27 NOTE — TOC Benefit Eligibility Note (Signed)
Patient Product/process development scientist completed.    The patient is currently admitted and upon discharge could be taking Ingrezza 40 mg capsules.  Requires Prior Authorzation  The patient is insured through [employer] Medicare part D     Roland Earl, CPhT Pharmacy Patient Advocate Specialist [facility] Patient Advocate Team Direct Number: [phone]  Fax: [phone]

## 2021-09-29 LAB — VITAMIN B1: Vitamin B1 (Thiamine): 82.6 nmol/L (ref 66.5–200.0)

## 2021-09-30 ENCOUNTER — Ambulatory Visit: Payer: Medicare PPO | Admitting: Adult Health

## 2021-10-04 ENCOUNTER — Encounter: Payer: Self-pay | Admitting: Adult Health

## 2021-10-04 ENCOUNTER — Ambulatory Visit (INDEPENDENT_AMBULATORY_CARE_PROVIDER_SITE_OTHER): Payer: Medicare PPO | Admitting: Adult Health

## 2021-10-04 VITALS — BP 140/84 | HR 84 | Temp 98.0°F | Resp 16 | Ht 64.0 in | Wt 175.0 lb

## 2021-10-04 DIAGNOSIS — F339 Major depressive disorder, recurrent, unspecified: Secondary | ICD-10-CM

## 2021-10-04 DIAGNOSIS — I471 Supraventricular tachycardia: Secondary | ICD-10-CM | POA: Diagnosis not present

## 2021-10-04 DIAGNOSIS — G9341 Metabolic encephalopathy: Secondary | ICD-10-CM | POA: Diagnosis not present

## 2021-10-04 DIAGNOSIS — N1832 Chronic kidney disease, stage 3b: Secondary | ICD-10-CM | POA: Diagnosis not present

## 2021-10-04 DIAGNOSIS — E669 Obesity, unspecified: Secondary | ICD-10-CM | POA: Diagnosis not present

## 2021-10-04 DIAGNOSIS — Z952 Presence of prosthetic heart valve: Secondary | ICD-10-CM

## 2021-10-04 DIAGNOSIS — G629 Polyneuropathy, unspecified: Secondary | ICD-10-CM | POA: Diagnosis not present

## 2021-10-04 DIAGNOSIS — Z6830 Body mass index (BMI) 30.0-30.9, adult: Secondary | ICD-10-CM

## 2021-10-04 NOTE — Progress Notes (Unsigned)
Location:  [facility]  Provider:   Kenard Gower DNP  Code Status:  Full Code  Goals of Care:     10/04/2021   11:05 AM  Advanced Directives  Does Patient Have a Medical Advance Directive? No  Would patient like information on creating a medical advance directive? No - Patient declined     Chief Complaint  Patient presents with   Establish Care    Patient is here to establish care     HPI: Patient is a 76 y.o. female seen today to establish care with [facility]. She has a PMH severe aortic stenosis s/p TAVR 02/2021, CKD 3B, paroxysmal atrial tachycardia and major depressive disorder. She was recently hospitalized on 09/22/2021 to 09/27/2021.  She was brought to the ED for episode of anxiety with hallucination.  In the ED, MRI was done revealing right frontal centrum semiovale acute to subacute infarct that was thought to be an artifact.  Psychiatry recommended neurology evaluation.  Neurology recommended outpatient follow-up for dementia work-up.  Patient was on multiple antidepressants including Cymbalta, Wellbutrin and [name].  She follows up with outpatient psychiatry.  Flexeril, tramadol and [name] were discontinued due to concern for tardive dyskinesia.  She came to the clinic today accompanied by her daughter. She currently takes Metformin 500 mg BID for weight loss. She has CKD 3B with latest GFR 48. Latest weight is 175 lbs with Body mass index is 30.04 kg/m.    Past Medical History:  Diagnosis Date   Anemia    Anxiety    Arthritis    Knees, neck, back   Chronic lower back pain    with siatica   Colon polyp 1999   Depression    GERD (gastroesophageal reflux disease)    Mild aortic insufficiency    Mild mitral regurgitation    Orthostatic hypotension    PAT (paroxysmal atrial tachycardia) (HCC)    PONV (postoperative nausea and vomiting)    S/P TAVR (transcatheter aortic valve replacement) 03/16/2021   s/p TAVR with a 29 mm Medtronic Evolut FX via the TF approach  by Dr. Lynnette Caffey and Dr. Laneta Simmers   Severe aortic stenosis    SVT (supraventricular tachycardia) (HCC)    TR (tricuspid regurgitation) 09/19/2016   moderate by echo 01/2020    Past Surgical History:  Procedure Laterality Date   CESAREAN SECTION  1964   X2   CHOLECYSTECTOMY     COSMETIC SURGERY  1980   removal extra fat inner thighs   EYE SURGERY Bilateral     Cataract extraction with IOL   INTRAOPERATIVE TRANSTHORACIC ECHOCARDIOGRAM N/A 03/16/2021   Procedure: INTRAOPERATIVE TRANSTHORACIC ECHOCARDIOGRAM;  Surgeon: Orbie Pyo, MD;  Location: [facility] OR;  Service: Open Heart Surgery;  Laterality: N/A;   RIGHT HEART CATH AND CORONARY ANGIOGRAPHY N/A 02/17/2021   Procedure: RIGHT HEART CATH AND CORONARY ANGIOGRAPHY;  Surgeon: Orbie Pyo, MD;  Location: [facility];  Service: Cardiovascular;  Laterality: N/A;   TOTAL KNEE ARTHROPLASTY Right 11/30/2015   Procedure: RIGHT TOTAL KNEE ARTHROPLASTY;  Surgeon: Ollen Gross, MD;  Location: [facility];  Service: Orthopedics;  Laterality: Right;   TOTAL KNEE ARTHROPLASTY Left 11/14/2016   Procedure: LEFT TOTAL KNEE ARTHROPLASTY;  Surgeon: Ollen Gross, MD;  Location: [facility];  Service: Orthopedics;  Laterality: Left;   TRANSCATHETER AORTIC VALVE REPLACEMENT, TRANSFEMORAL Right 03/16/2021   Procedure: TRANSCATHETER AORTIC VALVE REPLACEMENT, TRANSFEMORAL;  Surgeon: Orbie Pyo, MD;  Location: [facility] OR;  Service: Open Heart Surgery;  Laterality: Right;  Allergies  Allergen Reactions   Sulfa Antibiotics Nausea And Vomiting and Other (See Comments)   Ativan [Lorazepam] Other (See Comments)    Hyperactive    Tizanidine Hcl Other (See Comments)    Pt does not remember     Outpatient Encounter Medications as of 10/04/2021  Medication Sig   acetaminophen (TYLENOL) 650 MG CR tablet Take 1,300 mg by mouth in the morning and at bedtime.   alendronate (FOSAMAX) 70 MG tablet Take 1 tablet by mouth every Thursday.   aspirin EC 81 MG tablet Take 81  mg by mouth daily. Swallow whole.   atorvastatin (LIPITOR) 10 MG tablet Take 10 mg by mouth at bedtime.    buPROPion (WELLBUTRIN XL) 300 MG 24 hr tablet Take 300 mg by mouth daily after breakfast.   calcium carbonate (OS-CAL) 600 MG TABS tablet Take 600 mg by mouth 2 (two) times daily with a meal.   cholecalciferol (VITAMIN D3) 25 MCG (1000 UNIT) tablet Take 1,000 Units by mouth in the morning and at bedtime.   DULoxetine (CYMBALTA) 30 MG capsule Take 90 mg by mouth daily after breakfast.   famotidine (PEPCID) 20 MG tablet Take 20 mg by mouth daily.   gabapentin (NEURONTIN) 300 MG capsule Take 300 mg by mouth 3 (three) times daily.   lidocaine (LIDODERM) 5 % Place 1 patch onto the skin daily. Remove & Discard patch within 12 hours or as directed by MD. Apply to site of pain   metoprolol succinate (TOPROL XL) 25 MG 24 hr tablet Take 1 tablet (25 mg total) by mouth in the morning.   vitamin B-12 (CYANOCOBALAMIN) 1000 MCG tablet Take 1,000 mcg by mouth daily.   [DISCONTINUED] metFORMIN (GLUCOPHAGE) 500 MG tablet Take 500 mg by mouth 2 (two) times daily.   No facility-administered encounter medications on file as of 10/04/2021.    Review of Systems:  Review of Systems  Constitutional:  Negative for appetite change, chills, fatigue and fever.  HENT:  Negative for congestion, hearing loss, rhinorrhea and sore throat.   Eyes: Negative.   Respiratory:  Negative for cough, shortness of breath and wheezing.   Cardiovascular:  Negative for chest pain, palpitations and leg swelling.  Gastrointestinal:  Negative for abdominal pain, constipation, diarrhea, nausea and vomiting.  Genitourinary:  Negative for dysuria.  Musculoskeletal:  Negative for arthralgias, back pain and myalgias.  Skin:  Negative for color change, rash and wound.  Neurological:  Negative for dizziness, weakness and headaches.  Psychiatric/Behavioral:  Negative for behavioral problems. The patient is not nervous/anxious.     Health  Maintenance  Topic Date Due   FOOT EXAM  Never done   OPHTHALMOLOGY EXAM  Never done   URINE MICROALBUMIN  Never done   Hepatitis C Screening  Never done   DEXA SCAN  Never done   Zoster Vaccines- Shingrix (2 of 2) 12/22/2018   COVID-19 Vaccine (4 - Moderna risk series) 11/28/2019   INFLUENZA VACCINE  09/14/2021   HEMOGLOBIN A1C  03/26/2022   TETANUS/TDAP  02/06/2023   Pneumonia Vaccine 14+ Years old  Completed   HPV VACCINES  Aged Out    Physical Exam: Vitals:   10/04/21 1314  BP: (!) 140/84  Pulse: 84  Resp: 16  Temp: 98 F (36.7 C)  TempSrc: Temporal  SpO2: 96%  Weight: 175 lb (79.4 kg)  Height: 5\' 4"  (1.626 m)   Body mass index is 30.04 kg/m. Physical Exam Constitutional:      Appearance: Normal appearance.  HENT:  Head: Normocephalic and atraumatic.     Nose: Nose normal.     Mouth/Throat:     Mouth: Mucous membranes are moist.  Eyes:     Conjunctiva/sclera: Conjunctivae normal.  Cardiovascular:     Rate and Rhythm: Normal rate and regular rhythm.  Pulmonary:     Effort: Pulmonary effort is normal.     Breath sounds: Normal breath sounds.  Abdominal:     General: Bowel sounds are normal.     Palpations: Abdomen is soft.  Musculoskeletal:        General: Normal range of motion.     Cervical back: Normal range of motion.  Skin:    General: Skin is warm and dry.  Neurological:     General: No focal deficit present.     Mental Status: She is alert and oriented to person, place, and time.  Psychiatric:        Mood and Affect: Mood normal.        Behavior: Behavior normal.        Thought Content: Thought content normal.        Judgment: Judgment normal.     Labs reviewed: Basic Metabolic Panel: Recent Labs    09/23/21 0455 09/23/21 0854 09/24/21 0242 09/27/21 0654  NA 139  --  138 138  K 3.4*  --  3.8 4.4  CL 108  --  107 111  CO2 25  --  24 22  GLUCOSE 90  --  102* 91  BUN 19  --  13 17  CREATININE 1.18*  --  1.15* 1.16*  CALCIUM 9.1   --  9.1 8.2*  MG 1.3*  --  1.7 1.9  PHOS  --   --  2.9  --   TSH 2.338 2.183 1.604  --    Liver Function Tests: Recent Labs    03/12/21 1040 09/22/21 1509 09/23/21 0455  AST 13* 14* 12*  ALT 9 9 8   ALKPHOS 56 48 40  BILITOT 0.6 0.5 0.5  PROT 6.6 6.4* 5.3*  ALBUMIN 3.6 3.6 2.9*   No results for input(s): "LIPASE", "AMYLASE" in the last 8760 hours. Recent Labs    09/23/21 0854  AMMONIA 14   CBC: Recent Labs    09/22/21 1509 09/23/21 0455 09/27/21 0654  WBC 5.9 5.3 5.9  NEUTROABS 3.4 2.6  --   HGB 10.9* 9.7* 10.5*  HCT 33.9* 29.8* 33.3*  MCV 96.0 94.3 97.7  PLT 238 203 260   Lipid Panel: Recent Labs    09/23/21 0455  CHOL 136  HDL 59  LDLCALC 64  TRIG 64  CHOLHDL 2.3   Lab Results  Component Value Date   HGBA1C 4.9 09/23/2021    Procedures since last visit: ECHOCARDIOGRAM COMPLETE BUBBLE STUDY  Result Date: 09/24/2021    ECHOCARDIOGRAM REPORT   Patient Name:   MARLA POULIOT Date of Exam: 09/24/2021 Medical Rec #:  11/24/2021    Height:       64.0 in Accession #:    [id number]   Weight:       183.0 lb Date of Birth:  1945/10/25    BSA:          1.884 m Patient Age:    76 years     BP:           131/85 mmHg Patient Gender: F            HR:  88 bpm. Exam Location:  Inpatient Procedure: 2D Echo, Cardiac Doppler, Color Doppler and Saline Contrast Bubble            Study Indications:    Stroke  History:        Patient has prior history of Echocardiogram examinations, most                 recent 04/16/2021. Risk Factors:Dyslipidemia and Diabetes. S/P                 TAVR with 29 mm CoreValve-Evolut Pro prosthetic (TAVR) valve                 present in the aortic position. CKD.  Sonographer:    Ross Ludwig RDCS (AE) Referring Phys: Deno Lunger [facility] IMPRESSIONS  1. Left ventricular ejection fraction, by estimation, is 60 to 65%. The left ventricle has normal function. The left ventricle has no regional wall motion abnormalities. Left ventricular diastolic parameters  are indeterminate. Elevated left ventricular end-diastolic pressure.  2. Right ventricular systolic function is normal. The right ventricular size is normal. There is mildly elevated pulmonary artery systolic pressure. The estimated right ventricular systolic pressure is 36.9 mmHg.  3. The mitral valve is normal in structure. Mild mitral valve regurgitation. No evidence of mitral stenosis.  4. The aortic valve has been repaired/replaced. There is a 29 mm CoreValve-Evolut Pro prosthetic, stented (TAVR) valve present in the aortic position.     Aortic valve regurgitation is not visualized. No aortic stenosis is present. Aortic valve area, by VTI measures 3.48 cm. Aortic valve mean gradient measures 5.0 mmHg. Aortic valve Vmax measures 1.46 m/s.  5. The inferior vena cava is dilated in size with >50% respiratory variability, suggesting right atrial pressure of 8 mmHg. Conclusion(s)/Recommendation(s): No intracardiac source of embolism detected on this transthoracic study. Consider a transesophageal echocardiogram to exclude cardiac source of embolism if clinically indicated. FINDINGS  Left Ventricle: Left ventricular ejection fraction, by estimation, is 60 to 65%. The left ventricle has normal function. The left ventricle has no regional wall motion abnormalities. The left ventricular internal cavity size was normal in size. There is  no left ventricular hypertrophy. Left ventricular diastolic parameters are indeterminate. Elevated left ventricular end-diastolic pressure. Right Ventricle: The right ventricular size is normal. No increase in right ventricular wall thickness. Right ventricular systolic function is normal. There is mildly elevated pulmonary artery systolic pressure. The tricuspid regurgitant velocity is 2.69  m/s, and with an assumed right atrial pressure of 8 mmHg, the estimated right ventricular systolic pressure is 36.9 mmHg. Left Atrium: Left atrial size was normal in size. Right Atrium: Right atrial  size was normal in size. Pericardium: There is no evidence of pericardial effusion. Mitral Valve: The mitral valve is normal in structure. Mild to moderate mitral annular calcification. Mild mitral valve regurgitation. No evidence of mitral valve stenosis. Tricuspid Valve: The tricuspid valve is normal in structure. Tricuspid valve regurgitation is mild . No evidence of tricuspid stenosis. Aortic Valve: The aortic valve has been repaired/replaced. Aortic valve regurgitation is not visualized. No aortic stenosis is present. Aortic valve mean gradient measures 5.0 mmHg. Aortic valve peak gradient measures 8.5 mmHg. Aortic valve area, by VTI measures 3.48 cm. There is a 29 mm CoreValve-Evolut Pro prosthetic, stented (TAVR) valve present in the aortic position. Pulmonic Valve: The pulmonic valve was normal in structure. Pulmonic valve regurgitation is trivial. No evidence of pulmonic stenosis. Aorta: The aortic root is normal in size and structure.  Venous: The inferior vena cava is dilated in size with greater than 50% respiratory variability, suggesting right atrial pressure of 8 mmHg. IAS/Shunts: No atrial level shunt detected by color flow Doppler. Agitated saline contrast was given intravenously to evaluate for intracardiac shunting.  LEFT VENTRICLE PLAX 2D LVIDd:         4.20 cm   Diastology LVIDs:         2.90 cm   LV e' medial:    6.20 cm/s LV PW:         0.90 cm   LV E/e' medial:  17.1 LV IVS:        0.90 cm   LV e' lateral:   7.51 cm/s LVOT diam:     2.70 cm   LV E/e' lateral: 14.1 LV SV:         100 LV SV Index:   53 LVOT Area:     5.73 cm  RIGHT VENTRICLE             IVC RV Basal diam:  3.10 cm     IVC diam: 2.30 cm RV S prime:     14.50 cm/s TAPSE (M-mode): 2.2 cm LEFT ATRIUM             Index        RIGHT ATRIUM           Index LA diam:        2.40 cm 1.27 cm/m   RA Area:     13.20 cm LA Vol (A2C):   48.9 ml 25.96 ml/m  RA Volume:   31.40 ml  16.67 ml/m LA Vol (A4C):   45.6 ml 24.21 ml/m LA Biplane  Vol: 52.0 ml 27.60 ml/m  AORTIC VALVE AV Area (Vmax):    3.53 cm AV Area (Vmean):   3.74 cm AV Area (VTI):     3.48 cm AV Vmax:           146.00 cm/s AV Vmean:          98.000 cm/s AV VTI:            0.288 m AV Peak Grad:      8.5 mmHg AV Mean Grad:      5.0 mmHg LVOT Vmax:         89.90 cm/s LVOT Vmean:        64.000 cm/s LVOT VTI:          0.175 m LVOT/AV VTI ratio: 0.61  AORTA Ao Root diam: 2.50 cm Ao Asc diam:  3.00 cm MITRAL VALVE                TRICUSPID VALVE MV Area (PHT): 2.87 cm     TR Peak grad:   28.9 mmHg MV Decel Time: 264 msec     TR Vmax:        269.00 cm/s MV E velocity: 106.00 cm/s MV A velocity: 97.10 cm/s   SHUNTS MV E/A ratio:  1.09         Systemic VTI:  0.18 m                             Systemic Diam: 2.70 cm [name] MD Electronically signed by [name] MD Signature Date/Time: 09/24/2021/4:53:02 PM    Final    EEG adult  Result Date: 09/23/2021 Charlsie Quest, MD     09/23/2021  6:58 PM Patient Name: Carlena Hurl  Waldeck MRN: [id number] Epilepsy Attending: Charlsie Quest Referring Physician/Provider: Marjorie Smolder, NP Date: 09/23/2021 Duration: 22.23 mins Patient history: 76yo F with ams. EEG to evaluate for seizure Level of alertness: Awake AEDs during EEG study: None Technical aspects: This EEG study was done with scalp electrodes positioned according to the 10-20 International system of electrode placement. Electrical activity was reviewed with band pass filter of 1-70Hz , sensitivity of 7 uV/mm, display speed of 56mm/sec with a  notched filter applied as appropriate. EEG data were recorded continuously and digitally stored.  Video monitoring was available and reviewed as appropriate. Description: No posterior dominant rhythm was seen. EEG showed continuous generalized 3 to 6 Hz theta-delta slowing.  Hyperventilation and photic stimulation were not performed.     ABNORMALITY - Continuous slow, generalized  IMPRESSION: This study is suggestive of moderate diffuse  encephalopathy, nonspecific etiology. No seizures or epileptiform discharges were seen throughout the recording.   Priyanka Annabelle Harman    CT ANGIO HEAD W OR WO CONTRAST  Result Date: 09/23/2021 CLINICAL DATA:  Follow-up examination for stroke. EXAM: CT ANGIOGRAPHY HEAD AND NECK TECHNIQUE: Multidetector CT imaging of the head and neck was performed using the standard protocol during bolus administration of intravenous contrast. Multiplanar CT image reconstructions and MIPs were obtained to evaluate the vascular anatomy. Carotid stenosis measurements (when applicable) are obtained utilizing NASCET criteria, using the distal internal carotid diameter as the denominator. RADIATION DOSE REDUCTION: This exam was performed according to the departmental dose-optimization program which includes automated exposure control, adjustment of the mA and/or kV according to patient size and/or use of iterative reconstruction technique. CONTRAST:  65mL OMNIPAQUE IOHEXOL 350 MG/ML SOLN COMPARISON:  Prior studies from 09/22/2021. FINDINGS: CTA NECK FINDINGS Aortic arch: Examination degraded by motion artifact. Visualized aortic arch normal caliber with standard 3 vessel morphology. Mild atheromatous change about the arch itself. No stenosis about the origin the great vessels. Right carotid system: Right common and internal carotid arteries are tortuous but widely patent without stenosis or dissection. Left carotid system: Left common and internal carotid arteries are tortuous but widely patent without stenosis or dissection. Vertebral arteries: Both vertebral arteries arise from subclavian arteries. No proximal subclavian artery stenosis. Vertebral arteries mildly tortuous bilaterally. Evaluation of the distal V3 segments somewhat limited by motion. Visualized vertebral arteries patent without stenosis or dissection. Skeleton: No discrete or worrisome osseous lesions. Moderate spondylosis present at C4-5 through C6-7. Degenerative  changes noted about the left TMJ. Other neck: No other acute soft tissue abnormality within the neck. Upper chest: Visualized upper chest demonstrates no acute finding. Review of the MIP images confirms the above findings CTA HEAD FINDINGS Anterior circulation: Evaluation of the intracranial circulation limited by motion artifact and patient positioning. Petrous segments patent bilaterally. Scattered atheromatous change within the carotid siphons without hemodynamically significant stenosis. A1 segments patent bilaterally. Right A1 hypoplastic. Normal anterior communicating artery complex. Both ACAs patent without visible stenosis. Left ACA dominant. No M1 stenosis or occlusion. No proximal MCA branch occlusion. Distal MCA branches perfused and symmetric. Posterior circulation: Both vertebral arteries patent without stenosis. Both PICA patent. Basilar patent to its distal aspect without stenosis. Superior cerebral arteries patent bilaterally. Both PCAs primarily supplied via the basilar well perfused or distal aspects. Venous sinuses: Grossly patent allowing for timing the contrast bolus and motion. Anatomic variants: None significant.  No aneurysm. Review of the MIP images confirms the above findings IMPRESSION: 1. Negative CTA for large vessel occlusion or other emergent finding. 2.  Mild atheromatous change about the carotid siphons without hemodynamically significant or correctable stenosis. 3. Diffuse tortuosity about the major arterial vasculature of the head and neck, suggesting chronic underlying hypertension. Electronically Signed   By: Rise MuBenjamin  McClintock M.D.   On: 09/23/2021 03:54   CT ANGIO NECK W OR WO CONTRAST  Result Date: 09/23/2021 CLINICAL DATA:  Follow-up examination for stroke. EXAM: CT ANGIOGRAPHY HEAD AND NECK TECHNIQUE: Multidetector CT imaging of the head and neck was performed using the standard protocol during bolus administration of intravenous contrast. Multiplanar CT image  reconstructions and MIPs were obtained to evaluate the vascular anatomy. Carotid stenosis measurements (when applicable) are obtained utilizing NASCET criteria, using the distal internal carotid diameter as the denominator. RADIATION DOSE REDUCTION: This exam was performed according to the departmental dose-optimization program which includes automated exposure control, adjustment of the mA and/or kV according to patient size and/or use of iterative reconstruction technique. CONTRAST:  65mL OMNIPAQUE IOHEXOL 350 MG/ML SOLN COMPARISON:  Prior studies from 09/22/2021. FINDINGS: CTA NECK FINDINGS Aortic arch: Examination degraded by motion artifact. Visualized aortic arch normal caliber with standard 3 vessel morphology. Mild atheromatous change about the arch itself. No stenosis about the origin the great vessels. Right carotid system: Right common and internal carotid arteries are tortuous but widely patent without stenosis or dissection. Left carotid system: Left common and internal carotid arteries are tortuous but widely patent without stenosis or dissection. Vertebral arteries: Both vertebral arteries arise from subclavian arteries. No proximal subclavian artery stenosis. Vertebral arteries mildly tortuous bilaterally. Evaluation of the distal V3 segments somewhat limited by motion. Visualized vertebral arteries patent without stenosis or dissection. Skeleton: No discrete or worrisome osseous lesions. Moderate spondylosis present at C4-5 through C6-7. Degenerative changes noted about the left TMJ. Other neck: No other acute soft tissue abnormality within the neck. Upper chest: Visualized upper chest demonstrates no acute finding. Review of the MIP images confirms the above findings CTA HEAD FINDINGS Anterior circulation: Evaluation of the intracranial circulation limited by motion artifact and patient positioning. Petrous segments patent bilaterally. Scattered atheromatous change within the carotid siphons  without hemodynamically significant stenosis. A1 segments patent bilaterally. Right A1 hypoplastic. Normal anterior communicating artery complex. Both ACAs patent without visible stenosis. Left ACA dominant. No M1 stenosis or occlusion. No proximal MCA branch occlusion. Distal MCA branches perfused and symmetric. Posterior circulation: Both vertebral arteries patent without stenosis. Both PICA patent. Basilar patent to its distal aspect without stenosis. Superior cerebral arteries patent bilaterally. Both PCAs primarily supplied via the basilar well perfused or distal aspects. Venous sinuses: Grossly patent allowing for timing the contrast bolus and motion. Anatomic variants: None significant.  No aneurysm. Review of the MIP images confirms the above findings IMPRESSION: 1. Negative CTA for large vessel occlusion or other emergent finding. 2. Mild atheromatous change about the carotid siphons without hemodynamically significant or correctable stenosis. 3. Diffuse tortuosity about the major arterial vasculature of the head and neck, suggesting chronic underlying hypertension. Electronically Signed   By: Rise MuBenjamin  McClintock M.D.   On: 09/23/2021 03:54   MR BRAIN WO CONTRAST  Result Date: 09/22/2021 CLINICAL DATA:  Initial evaluation for neuro deficit, stroke suspected. EXAM: MRI HEAD WITHOUT CONTRAST TECHNIQUE: Multiplanar, multiecho pulse sequences of the brain and surrounding structures were obtained without intravenous contrast. COMPARISON:  Prior CT from earlier the same day. FINDINGS: Brain: Examination degraded by motion artifact. Mild age-related cerebral atrophy. Patchy T2/FLAIR hyperintensity involving the periventricular deep white matter both cerebral hemispheres, consistent with chronic small vessel  ischemic disease, mild for age. Punctate 4 mm focus of diffusion signal abnormality involving the deep white matter of the right frontal centrum semi ovale, consistent with a small acute to early subacute  ischemic infarct (series 12, image 41). No other convincing evidence for acute or subacute ischemia. Gray-white matter differentiation otherwise maintained. No visible areas of chronic cortical infarction. No visible acute or chronic intracranial blood products. No mass lesion, midline shift or mass effect. No hydrocephalus or extra-axial fluid collection. Pituitary gland and suprasellar region within normal limits. Vascular: Major intracranial vascular flow voids are maintained. Skull and upper cervical spine: Craniocervical junction within normal limits. Bone marrow signal intensity grossly normal. No scalp soft tissue abnormality. Sinuses/Orbits: Prior bilateral ocular lens replacement. Paranasal sinuses are largely clear. No significant mastoid effusion. Other: None. IMPRESSION: 1. 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 No associated hemorrhage. 2. No other acute intracranial abnormality. 3. 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 Electronically Signed   By: Rise Mu M.D.   On: 09/22/2021 23:02   CT Head Wo Contrast  Result Date: 09/22/2021 CLINICAL DATA:  Mental status change unknown cause. EXAM: CT HEAD WITHOUT CONTRAST TECHNIQUE: Contiguous axial images were obtained from the base of the skull through the vertex without intravenous contrast. RADIATION DOSE REDUCTION: This exam was performed according to the departmental dose-optimization program which includes automated exposure control, adjustment of the mA and/or kV according to patient size and/or use of iterative reconstruction technique. COMPARISON:  None Available. FINDINGS: Brain: Mild atrophy. Negative for hydrocephalus. Negative for acute infarct, hemorrhage, mass. Mild white matter hypodensity consistent with small vessel ischemia Vascular: Negative for hyperdense  vessel Skull: Negative Sinuses/Orbits: Paranasal sinuses clear. Bilateral cataract extraction Other: None. IMPRESSION: Mild atrophy and mild chronic microvascular ischemia. No acute intracranial abnormality. Electronically Signed   By: Marlan Palau M.D.   On: 09/22/2021 16:18   DG Chest 2 View  Result Date: 09/22/2021 CLINICAL DATA:  Altered mental status EXAM: CHEST - 2 VIEW COMPARISON:  Chest x-ray dated August 10, 2021 FINDINGS: Cardiac and mediastinal contours are within normal limits. Status post transcatheter aortic valve replacement. Large hiatal hernia. Lungs are clear. No large pleural effusion or pneumothorax. 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 IMPRESSION: 1. Clear lungs. 2. Large hiatal hernia. Electronically Signed   By: Allegra Lai M.D.   On: 09/22/2021 15:46    Assessment/Plan     Labs/tests ordered:    Next appt:  Visit date not found

## 2021-10-04 NOTE — Patient Instructions (Signed)
Chronic Kidney Disease, Adult Chronic kidney disease (CKD) occurs when the kidneys are slowly and permanently damaged over a long period of time. The kidneys are a pair of organs that do many important jobs in the body, including: Removing waste and extra fluid from the blood to make urine. Making hormones that maintain the amount of fluid in tissues and blood vessels. Maintaining the right amount of fluids and chemicals in the body. A small amount of kidney damage may not cause problems, but a large amount of damage may make it hard or impossible for the kidneys to work right. Steps must be taken to slow kidney damage or to stop it from getting worse. If steps are not taken, the kidneys may stop working permanently (end-stage renal disease, or ESRD). Most of the time, CKD does not go away, but it can often be controlled. People who have CKD are usually able to live full lives. What are the causes? The most common causes of this condition are diabetes and high blood pressure (hypertension). Other causes include: Cardiovascular diseases. These affect the heart and blood vessels. Kidney diseases. These include: Glomerulonephritis, or inflammation of the tiny filters in the kidneys. Interstitial nephritis. This is swelling of the small tubes of the kidneys and of the surrounding structures. Polycystic kidney disease, in which clusters of fluid-filled sacs form within the kidneys. Renal vascular disease. This includes disorders that affect the arteries and veins of the kidneys. Diseases that affect the body's defense system (immune system). A problem with urine flow. This may be caused by: Kidney stones. Cancer. An enlarged prostate, in males. A kidney infection or urinary tract infection (UTI) that keeps coming back. Vasculitis. This is swelling or inflammation of the blood vessels. What increases the risk? Your chances of having kidney disease increase with age. The following factors may make  you more likely to develop this condition: A family history of kidney disease or kidney failure. Kidney failure means the kidneys can no longer work right. Certain genetic diseases. Taking medicines often that are damaging to the kidneys. Being around or being in contact with toxic substances. Obesity. A history of tobacco use. What are the signs or symptoms? Symptoms of this condition include: Feeling very tired (lethargic) and having less energy. Swelling, or edema, of the face, legs, ankles, or feet. Nausea or vomiting, or loss of appetite. Confusion or trouble concentrating. Muscle twitches and cramps, especially in the legs. Dry, itchy skin. A metallic taste in the mouth. Producing less urine, or producing more urine (especially at night). Shortness of breath. Trouble sleeping. CKD may also result in not having enough red blood cells or hemoglobin in the blood (anemia) or having weak bones (bone disease). Symptoms develop slowly and may not be obvious until the kidney damage becomes severe. It is possible to have kidney disease for years without having symptoms. How is this diagnosed? This condition may be diagnosed based on: Blood tests. Urine tests. Imaging tests, such as an ultrasound or a CT scan. A kidney biopsy. This involves removing a sample of kidney tissue to be looked at under a microscope. Results from these tests will help to determine how serious the CKD is. How is this treated? There is no cure for most cases of this condition, but treatment usually relieves symptoms and prevents or slows the worsening of the disease. Treatment may include: Diet changes, which may require you to avoid alcohol and foods that are high in salt, potassium, phosphorous, and protein. Medicines. These may:   Lower blood pressure. Control blood sugar (glucose). Relieve anemia. Relieve swelling. Protect your bones. Improve the balance of salts and minerals in your blood  (electrolytes). Dialysis, which is a type of treatment that removes toxic waste from the body. It may be needed if you have kidney failure. Managing any other conditions that are causing your CKD or making it worse. Follow these instructions at home: Medicines Take over-the-counter and prescription medicines only as told by your health care provider. The amount of some medicines that you take may need to be changed. Do not take any new medicines unless approved by your health care provider. Many medicines can make kidney damage worse. Do not take any vitamin and mineral supplements unless approved by your health care provider. Many nutritional supplements can make kidney damage worse. Lifestyle  Do not use any products that contain nicotine or tobacco, such as cigarettes, e-cigarettes, and chewing tobacco. If you need help quitting, ask your health care provider. If you drink alcohol: Limit how much you use to: 0-1 drink a day for women who are not pregnant. 0-2 drinks a day for men. Know how much alcohol is in your drink. In the [place] one drink equals one 12 oz bottle of beer (355 mL), one 5 oz glass of wine (148 mL), or one 1 oz glass of hard liquor (44 mL). Maintain a healthy weight. If you need help, ask your health care provider. General instructions  Follow instructions from your health care provider about eating or drinking restrictions, including any prescribed diet. Track your blood pressure at home. Report changes in your blood pressure as told. If you are being treated for diabetes, track your blood glucose levels as told. Start or continue an exercise plan. Exercise at least 30 minutes a day, 5 days a week. Keep your immunizations up to date as told. Keep all follow-up visits. This is important. Where to find more information American Association of Kidney Patients: www.aakp.org National Kidney Foundation: www.kidney.org American Kidney Fund: www.akfinc.org Life Options:  www.lifeoptions.org Kidney School: www.kidneyschool.org Contact a health care provider if: Your symptoms get worse. You develop new symptoms. Get help right away if: You develop symptoms of ESRD. These include: Headaches. Numbness in your hands or feet. Easy bruising. Frequent hiccups. Chest pain. Shortness of breath. Lack of menstrual periods, in women. You have a fever. You are producing less urine than usual. You have pain or bleeding when you urinate or when you have a bowel movement. These symptoms may represent a serious problem that is an emergency. Do not wait to see if the symptoms will go away. Get medical help right away. Call your local emergency services (911 in the [place] Do not drive yourself to the hospital. Summary Chronic kidney disease (CKD) occurs when the kidneys become damaged slowly over a long period of time. The most common causes of this condition are diabetes and high blood pressure (hypertension). There is no cure for most cases of CKD, but treatment usually relieves symptoms and prevents or slows the worsening of the disease. Treatment may include a combination of lifestyle changes, medicines, and dialysis. This information is not intended to replace advice given to you by your health care provider. Make sure you discuss any questions you have with your health care provider. Document Revised: 05/08/2019 Document Reviewed: 05/08/2019 Elsevier Patient Education  2023 Elsevier Inc.  

## 2021-10-05 ENCOUNTER — Ambulatory Visit: Payer: Self-pay | Admitting: Adult Health

## 2021-11-30 DIAGNOSIS — M19012 Primary osteoarthritis, left shoulder: Secondary | ICD-10-CM | POA: Diagnosis not present

## 2021-12-07 ENCOUNTER — Other Ambulatory Visit: Payer: Self-pay

## 2021-12-07 ENCOUNTER — Emergency Department (HOSPITAL_BASED_OUTPATIENT_CLINIC_OR_DEPARTMENT_OTHER)
Admission: EM | Admit: 2021-12-07 | Discharge: 2021-12-07 | Disposition: A | Discharge status: Home / Self Care | Payer: Medicare PPO | Attending: Emergency Medicine | Admitting: Emergency Medicine

## 2021-12-07 ENCOUNTER — Telehealth: Payer: Self-pay | Admitting: Cardiology

## 2021-12-07 DIAGNOSIS — Z79899 Other long term (current) drug therapy: Secondary | ICD-10-CM | POA: Diagnosis not present

## 2021-12-07 DIAGNOSIS — M25512 Pain in left shoulder: Secondary | ICD-10-CM | POA: Insufficient documentation

## 2021-12-07 DIAGNOSIS — Z1389 Encounter for screening for other disorder: Secondary | ICD-10-CM | POA: Diagnosis not present

## 2021-12-07 DIAGNOSIS — I471 Supraventricular tachycardia, unspecified: Secondary | ICD-10-CM | POA: Diagnosis not present

## 2021-12-07 DIAGNOSIS — Z7982 Long term (current) use of aspirin: Secondary | ICD-10-CM | POA: Insufficient documentation

## 2021-12-07 DIAGNOSIS — Z0000 Encounter for general adult medical examination without abnormal findings: Secondary | ICD-10-CM | POA: Diagnosis not present

## 2021-12-07 DIAGNOSIS — R42 Dizziness and giddiness: Secondary | ICD-10-CM | POA: Diagnosis not present

## 2021-12-07 LAB — CBC WITH DIFFERENTIAL/PLATELET
Abs Immature Granulocytes: 0.05 10*3/uL (ref 0.00–0.07)
Basophils Absolute: 0.1 10*3/uL (ref 0.0–0.1)
Basophils Relative: 1 %
Eosinophils Absolute: 0.1 10*3/uL (ref 0.0–0.5)
Eosinophils Relative: 1 %
HCT: 45.3 % (ref 36.0–46.0)
Hemoglobin: 14.3 g/dL (ref 12.0–15.0)
Immature Granulocytes: 0 %
Lymphocytes Relative: 34 %
Lymphs Abs: 4.3 10*3/uL — ABNORMAL HIGH (ref 0.7–4.0)
MCH: 30.6 pg (ref 26.0–34.0)
MCHC: 31.6 g/dL (ref 30.0–36.0)
MCV: 96.8 fL (ref 80.0–100.0)
Monocytes Absolute: 1.1 10*3/uL — ABNORMAL HIGH (ref 0.1–1.0)
Monocytes Relative: 8 %
Neutro Abs: 7.1 10*3/uL (ref 1.7–7.7)
Neutrophils Relative %: 56 %
Platelets: 393 10*3/uL (ref 150–400)
RBC: 4.68 MIL/uL (ref 3.87–5.11)
RDW: 13.6 % (ref 11.5–15.5)
WBC: 12.7 10*3/uL — ABNORMAL HIGH (ref 4.0–10.5)
nRBC: 0 % (ref 0.0–0.2)

## 2021-12-07 LAB — BASIC METABOLIC PANEL
Anion gap: 13 (ref 5–15)
BUN: 33 mg/dL — ABNORMAL HIGH (ref 8–23)
CO2: 21 mmol/L — ABNORMAL LOW (ref 22–32)
Calcium: 9.9 mg/dL (ref 8.9–10.3)
Chloride: 103 mmol/L (ref 98–111)
Creatinine, Ser: 1.59 mg/dL — ABNORMAL HIGH (ref 0.44–1.00)
GFR, Estimated: 33 mL/min — ABNORMAL LOW (ref >60)
Glucose, Bld: 111 mg/dL — ABNORMAL HIGH (ref 70–99)
Potassium: 4.8 mmol/L (ref 3.5–5.1)
Sodium: 137 mmol/L (ref 135–145)

## 2021-12-07 LAB — MAGNESIUM: Magnesium: 1.9 mg/dL (ref 1.7–2.4)

## 2021-12-07 MED ORDER — METOPROLOL TARTRATE 5 MG/5ML IV SOLN
5.0000 mg | Freq: Once | INTRAVENOUS | Status: AC
  Administered 2021-12-07: 5 mg via INTRAVENOUS
  Filled 2021-12-07: qty 5

## 2021-12-07 MED ORDER — SODIUM CHLORIDE 0.9 % IV BOLUS
500.0000 mL | Freq: Once | INTRAVENOUS | Status: AC
  Administered 2021-12-07: 500 mL via INTRAVENOUS

## 2021-12-07 NOTE — ED Triage Notes (Signed)
Pt arrived POV. Pt caox4, ambulatory into triage RM 1 no obvious distress. Pt states she was at her PCP for a medication follow up and had an ECG done which showed SVT at 160 bpm who advised her to go straight to the ED.   Pt reports hx of SVT.   Pt c/o weakness and nausea since yesterday. Denies CP, palpitations, SOB at present.

## 2021-12-07 NOTE — ED Provider Notes (Signed)
[facility] EMERGENCY DEPT Provider Note   CSN: [id number] Arrival date & time: 12/07/21  1201     History  Chief Complaint  Patient presents with   Tachycardia    Nicole Graves is a 76 y.o. female.  76 yo F with a chief complaints of finding out that her heart rate was rapid.  She went to her family doctor's office today for a Medicare medication check.  Was found to incidentally have a heart rate in the 160s.  The patient not specifically symptomatic with this.  Tells me that she was having some increased left shoulder pain that she typically gets from her arthralgias.  Presumed it likely has been going on for about 48 hours.  She denies any difficulty breathing denies lower extremity edema.  Denies orthopnea.  Denies cough congestion or fever.  Has been taken off of some of her medications after she was thought to not of had a stroke and had serotonin syndrome.        Home Medications Prior to Admission medications   Medication Sig Start Date End Date Taking? Authorizing Provider  acetaminophen (TYLENOL) 650 MG CR tablet Take 1,300 mg by mouth in the morning and at bedtime.    [provider]  alendronate (FOSAMAX) 70 MG tablet Take 1 tablet by mouth every Thursday.    [provider]  aspirin EC 81 MG tablet Take 81 mg by mouth daily. Swallow whole.    [provider]  atorvastatin (LIPITOR) 10 MG tablet Take 10 mg by mouth at bedtime.  02/11/15   [provider]  buPROPion (WELLBUTRIN XL) 300 MG 24 hr tablet Take 300 mg by mouth daily after breakfast.    [provider]  calcium carbonate (OS-CAL) 600 MG TABS tablet Take 600 mg by mouth 2 (two) times daily with a meal.    [provider]  cholecalciferol (VITAMIN D3) 25 MCG (1000 UNIT) tablet Take 1,000 Units by mouth in the morning and at bedtime.    [provider]  DULoxetine (CYMBALTA) 30 MG capsule Take 90 mg by mouth daily after breakfast.     [provider]  famotidine (PEPCID) 20 MG tablet Take 20 mg by mouth daily.    [provider]  gabapentin (NEURONTIN) 300 MG capsule Take 300 mg by mouth 3 (three) times daily. 12/20/19   [provider]  lidocaine (LIDODERM) 5 % Place 1 patch onto the skin daily. Remove & Discard patch within 12 hours or as directed by MD. Apply to site of pain 09/27/21   Pokhrel, Corrie Mckusick, MD  metoprolol succinate (TOPROL XL) 25 MG 24 hr tablet Take 1 tablet (25 mg total) by mouth in the morning. 06/22/21   [name], MD  vitamin B-12 (CYANOCOBALAMIN) 1000 MCG tablet Take 1,000 mcg by mouth daily.    [provider]      Allergies    Sulfa antibiotics, Ativan [lorazepam], and Tizanidine hcl    Review of Systems   Review of Systems  Physical Exam Updated Vital Signs BP 122/89   Pulse 70   Temp (!) 97.5 F (36.4 C) (Oral)   Resp 11   SpO2 100%  Physical Exam Vitals and nursing note reviewed.  Constitutional:      General: She is not in acute distress.    Appearance: She is well-developed. She is not diaphoretic.  HENT:     Head: Normocephalic and atraumatic.  Eyes:     Pupils: Pupils are  equal, round, and reactive to light.  Cardiovascular:     Rate and Rhythm: Regular rhythm. Tachycardia present.     Heart sounds: No murmur heard.    No friction rub. No gallop.  Pulmonary:     Effort: Pulmonary effort is normal.     Breath sounds: No wheezing or rales.  Abdominal:     General: There is no distension.     Palpations: Abdomen is soft.     Tenderness: There is no abdominal tenderness.  Musculoskeletal:        General: No tenderness.     Cervical back: Normal range of motion and neck supple.  Skin:    General: Skin is warm and dry.  Neurological:     Mental Status: She is alert and oriented to person, place, and time.  Psychiatric:        Behavior: Behavior normal.     ED Results / Procedures / Treatments   Labs (all labs ordered are listed,  but only abnormal results are displayed) Labs Reviewed  CBC WITH DIFFERENTIAL/PLATELET - Abnormal; Notable for the following components:      Result Value   WBC 12.7 (*)    Lymphs Abs 4.3 (*)    Monocytes Absolute 1.1 (*)    All other components within normal limits  BASIC METABOLIC PANEL - Abnormal; Notable for the following components:   CO2 21 (*)    Glucose, Bld 111 (*)    BUN 33 (*)    Creatinine, Ser 1.59 (*)    GFR, Estimated 33 (*)    All other components within normal limits  MAGNESIUM    EKG EKG Interpretation  Date/Time:  Tuesday December 07 2021 12:29:36 EDT Ventricular Rate:  167 PR Interval:    QRS Duration: 100 QT Interval:  290 QTC Calculation: 483 R Axis:   10 Text Interpretation: Supraventricular tachycardia Nonspecific ST and T wave abnormality Abnormal ECG Otherwise no significant change Confirmed by Melene Plan [phone]) on 12/07/2021 1:13:21 PM  Radiology No results found.  Procedures .Cardioversion  Date/Time: 12/07/2021 1:15 PM  Performed by: Melene Plan, DO Authorized by: Melene Plan, DO   Consent:    Consent obtained:  Verbal   Consent given by:  Patient   Alternatives discussed:  No treatment and rate-control medication Universal protocol:    Immediately prior to procedure a time out was called: yes   Pre-procedure details:    Cardioversion basis:  Elective   Rhythm:  Supraventricular tachycardia Patient sedated: No Comments:     Patient was converted with metoprolol.  Nicole Kitchen1-3 Lead EKG Interpretation  Performed by: Melene Plan, DO Authorized by: Melene Plan, DO     Interpretation: normal     ECG rate:  78   ECG rate assessment: normal     Rhythm: sinus rhythm     Ectopy: none     Conduction: normal   .Critical Care  Performed by: Melene Plan, DO Authorized by: Melene Plan, DO   Critical care provider statement:    Critical care time (minutes):  35   Critical care time was exclusive of:  Separately billable procedures and treating  other patients   Critical care was time spent personally by me on the following activities:  Development of treatment plan with patient or surrogate, discussions with consultants, evaluation of patient's response to treatment, examination of patient, ordering and review of laboratory studies, ordering and review of radiographic studies, ordering and performing treatments and interventions, pulse oximetry, re-evaluation of patient's condition  and review of old charts     Medications Ordered in ED Medications  sodium chloride 0.9 % bolus 500 mL (0 mLs Intravenous Stopped 12/07/21 1355)  metoprolol tartrate (LOPRESSOR) injection 5 mg (5 mg Intravenous Given 12/07/21 1257)    ED Course/ Medical Decision Making/ A&P                           Medical Decision Making Amount and/or Complexity of Data Reviewed Labs: ordered.  Risk Prescription drug management.   76 yo F with a chief complaints of a rapid heart rate.  This was noted incidentally at her PCP visit today.  She does have a history of SVT and usually symptomatic with it.  She was having some increased left shoulder pain that she thinks might of been indicative of it.  She has no signs of heart failure.  We will obtain a laboratory evaluation chest x-ray bolus of IV fluids bolus of metoprolol and reassess.  Patient converted after her bolus dose of metoprolol.  Patient continues to feel well.  No significant electrolyte abnormality.  No significant anemia.  Mild bump in her creatinine.  Question dehydration.  Chest x-ray independently interpreted by me without focal trait or pneumothorax.    2:02 PM:  I have discussed the diagnosis/risks/treatment options with the patient and family.  Evaluation and diagnostic testing in the emergency department does not suggest an emergent condition requiring admission or immediate intervention beyond what has been performed at this time.  They will follow up with PCP. We also discussed returning to the  ED immediately if new or worsening sx occur. We discussed the sx which are most concerning (e.g., sudden worsening pain, fever, inability to tolerate by mouth) that necessitate immediate return. Medications administered to the patient during their visit and any new prescriptions provided to the patient are listed below.  Medications given during this visit Medications  sodium chloride 0.9 % bolus 500 mL (0 mLs Intravenous Stopped 12/07/21 1355)  metoprolol tartrate (LOPRESSOR) injection 5 mg (5 mg Intravenous Given 12/07/21 1257)     The patient appears reasonably screen and/or stabilized for discharge and I doubt any other medical condition or other [facility] requiring further screening, evaluation, or treatment in the ED at this time prior to discharge.          Final Clinical Impression(s) / ED Diagnoses Final diagnoses:  SVT (supraventricular tachycardia)    Rx / DC Orders ED Discharge Orders     None         Melene Plan, DO 12/07/21 1402

## 2021-12-07 NOTE — ED Notes (Signed)
Pt given Metoprolol and HR at 74 now. VSS. Pt states she "feels a little nauseous, but this is how her PO extra Metoprolol at home makes her feel". Family at bedside. No c/o pain, a/ox4.

## 2021-12-07 NOTE — ED Notes (Signed)
EDP notified of pt HR.  Heather RN at the bedside

## 2021-12-07 NOTE — ED Notes (Signed)
Call to lab as tubes were sent prior to orders placed

## 2021-12-07 NOTE — Discharge Instructions (Signed)
Follow-up with your family doctor in the office.  Please return for worsening or persistent symptoms.  Let your cardiologist know that you went back into SVT.

## 2021-12-07 NOTE — Telephone Encounter (Signed)
Pt c/o medication issue:  1. Name of Medication: metoprolol succinate (TOPROL XL) 25 MG 24 hr tablet  2. How are you currently taking this medication (dosage and times per day)?  Take 1 tablet (25 mg total) by mouth in the morning  3. Are you having a reaction (difficulty breathing--STAT)? no  4. What is your medication issue? Patient states she was in the ED earlier today because of her heart rate being in the 160's.  They gave her IV metoprolol and that brought down her heart rate. They advised her to call the office to find out if she should start taking metoprolol twice a day or continue taking it only once a day.  Please advise.

## 2021-12-07 NOTE — ED Notes (Signed)
Pt sent home with daughter. Reviewed dc instructions, opportunities given for questioning. Pt and daughter verbalized understanding. Leanne Chang, RN

## 2021-12-08 MED ORDER — METOPROLOL SUCCINATE ER 25 MG PO TB24: 37.5000 mg | ORAL_TABLET | Freq: Every day | ORAL | 3 refills | Status: DC

## 2021-12-08 NOTE — Telephone Encounter (Signed)
Patient aware of Dr. Theodosia Blender recommendations to increase Toprol. Patient verbalized understanding. New prescription has been sent in.

## 2021-12-13 DIAGNOSIS — F325 Major depressive disorder, single episode, in full remission: Secondary | ICD-10-CM | POA: Diagnosis not present

## 2021-12-13 DIAGNOSIS — N183 Chronic kidney disease, stage 3 unspecified: Secondary | ICD-10-CM | POA: Diagnosis not present

## 2021-12-13 DIAGNOSIS — Z23 Encounter for immunization: Secondary | ICD-10-CM | POA: Diagnosis not present

## 2021-12-13 DIAGNOSIS — E6609 Other obesity due to excess calories: Secondary | ICD-10-CM | POA: Diagnosis not present

## 2021-12-13 DIAGNOSIS — M19012 Primary osteoarthritis, left shoulder: Secondary | ICD-10-CM | POA: Diagnosis not present

## 2021-12-13 DIAGNOSIS — M810 Age-related osteoporosis without current pathological fracture: Secondary | ICD-10-CM | POA: Diagnosis not present

## 2021-12-13 DIAGNOSIS — E7800 Pure hypercholesterolemia, unspecified: Secondary | ICD-10-CM | POA: Diagnosis not present

## 2021-12-16 ENCOUNTER — Telehealth: Payer: Self-pay | Admitting: *Deleted

## 2021-12-16 NOTE — Telephone Encounter (Signed)
     Patient  visit on 12/07/2021  at [facility] was for palpatations  [facility] has contacted cardilogist and is doing better. e you been able to follow up with your primary care physician?  The patient was or was not able to obtain any needed medicine or equipment.  Are there diet recommendations that you are having difficulty following?  Patient expresses understanding of discharge instructions and education provided has no other needs at this time.    [facility] [phone] [address] , [place] [address] Email : [email or web address]

## 2021-12-21 ENCOUNTER — Ambulatory Visit: Payer: Medicare PPO | Admitting: Cardiology

## 2021-12-21 ENCOUNTER — Encounter: Payer: Self-pay | Admitting: Physician Assistant

## 2021-12-21 ENCOUNTER — Ambulatory Visit: Payer: Medicare PPO | Attending: Cardiology | Admitting: Nurse Practitioner

## 2021-12-21 VITALS — BP 136/78 | HR 78 | Ht 63.0 in | Wt 177.0 lb

## 2021-12-21 DIAGNOSIS — I350 Nonrheumatic aortic (valve) stenosis: Secondary | ICD-10-CM

## 2021-12-21 DIAGNOSIS — N1832 Chronic kidney disease, stage 3b: Secondary | ICD-10-CM | POA: Diagnosis not present

## 2021-12-21 DIAGNOSIS — I4719 Other supraventricular tachycardia: Secondary | ICD-10-CM

## 2021-12-21 DIAGNOSIS — I471 Supraventricular tachycardia, unspecified: Secondary | ICD-10-CM | POA: Diagnosis not present

## 2021-12-21 DIAGNOSIS — Z952 Presence of prosthetic heart valve: Secondary | ICD-10-CM | POA: Diagnosis not present

## 2021-12-21 DIAGNOSIS — E785 Hyperlipidemia, unspecified: Secondary | ICD-10-CM

## 2021-12-21 DIAGNOSIS — I071 Rheumatic tricuspid insufficiency: Secondary | ICD-10-CM

## 2021-12-21 DIAGNOSIS — I10 Essential (primary) hypertension: Secondary | ICD-10-CM

## 2021-12-21 NOTE — Progress Notes (Signed)
Cardiology Office Note:    Date:  12/21/2021   ID:  Nicole Graves, DOB 10-Jul-1945, MRN [id number]  PCP:  Kathyrn Lass, MD   [facility] Cardiologist:  Fransico Him, MD     Referring MD: Kathyrn Lass, MD   CC: Here for 6 month follow-up appointment  History of Present Illness:    Nicole Graves is a 76 y.o. female with a hx of the following:  PAT Aortic insufficiency SVT AS, s/p TAVR TR T2DM HLD CKD stage 3b Anemia of chronic disease Large hiatal hernia    In October 2020, 2D echo revealed normal LV function with moderate LVH, mild MR, and moderate to severe AAS with mean aortic valve gradient 25 mmHg, DI 0.25 and stroke-volume index reduced to 28, consistent with low-flow low gradient moderate to severe AAS in setting of normal LV function.  Underwent TAVR on March 16, 2021 with a 29 mm Medtronic evolute pro-plus via the TF approach.  Postop echo revealed normal LV function with EF 55% and mean TAVR gradient of 9 mmHg with trivial paravalvular leak at 1 o'clock position.  Last seen by Dr. Radford [name] in May 2023 and was doing well.  Tolerating her medications well.  She did note some issues with dizziness, BP in office that day was 98/72.  Toprol-XL was decreased to 25 mg daily.  24-hour blood pressure monitor was normal.  2D echo complete in August 2023 revealed EF 60 to 65%, elevated LVEDP, mildly elevated PASP.  Estimated RVSP 36.9 mmHg.  Mild MR, no mitral stenosis, no AR visualized and no AS present.   Most recently she presented to [facility] ED on December 07, 2021 with chief complaint of tachycardia.  She went to her primary care provider for medication check and was incidentally found to have heart rate in 160s, was not specifically symptomatic with this, this was going on for about 48 hours, had been recently taken off some of her medications.  Twelve-lead EKG revealed SVT with nonspecific ST and T wave abnormality, 167 bpm.  Patient was converted to normal  sinus rhythm via metoprolol given IV.  Electrolytes normal, chest x-ray without anything acute.  Today she presents for 53-monthfollow-up appointment.  She states she is doing well since last office visit with Dr. TRadford [name]  Had an episode of hallucinations while on [name].  Stopped several medications, had medication check in October 2023 when she realized heart rate was in 160s, did not feel any palpitations or have any symptoms.  Went to the ED and received metoprolol, and denies any palpitations since.  Denies any chest pain, shortness of breath, syncope, presyncope, dizziness, orthopnea, PND, bleeding, or claudication.  BP well controlled at home.  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 TAli Loweand Dr. BCyndia Bent  Severe  aortic stenosis    Stage 3b chronic kidney disease (CKD) (HCC)    SVT (supraventricular tachycardia)    TR (tricuspid regurgitation) 09/19/2016   mild by echo 09/2021    Past Surgical History:  Procedure Laterality Date   CESAREAN SECTION  1964   [place]   removal extra fat inner thighs   EYE SURGERY Bilateral     Cataract extraction with IOL   INTRAOPERATIVE TRANSTHORACIC ECHOCARDIOGRAM N/A 03/16/2021   Procedure: INTRAOPERATIVE TRANSTHORACIC ECHOCARDIOGRAM;  Surgeon: Early Osmond, MD;  Location: [facility];  Service: Open Heart Surgery;  Laterality: N/A;   RIGHT HEART  CATH AND CORONARY ANGIOGRAPHY N/A 02/17/2021   Procedure: RIGHT HEART CATH AND CORONARY ANGIOGRAPHY;  Surgeon: Early Osmond, MD;  Location: [facility];  Service: Cardiovascular;  Laterality: N/A;   TOTAL KNEE ARTHROPLASTY Right 11/30/2015   Procedure: RIGHT TOTAL KNEE ARTHROPLASTY;  Surgeon: Gaynelle Arabian, MD;  Location: [facility];  Service: Orthopedics;  Laterality: Right;   TOTAL KNEE ARTHROPLASTY Left 11/14/2016   Procedure: LEFT TOTAL KNEE ARTHROPLASTY;  Surgeon: Gaynelle Arabian, MD;  Location: [facility];  Service: Orthopedics;  Laterality: Left;   TRANSCATHETER AORTIC VALVE REPLACEMENT, TRANSFEMORAL Right 03/16/2021   Procedure: TRANSCATHETER AORTIC VALVE REPLACEMENT, TRANSFEMORAL;  Surgeon: Early Osmond, MD;  Location: [facility];  Service: Open Heart Surgery;  Laterality: Right;    Current Medications: Current Meds  Medication Sig   acetaminophen (TYLENOL) 650 MG CR tablet Take 1,300 mg by mouth in the morning and at bedtime.   alendronate (FOSAMAX) 70 MG tablet Take 1 tablet by mouth every Thursday.   amoxicillin (AMOXIL) 500 MG tablet Take by mouth. TAKE 4 TABLETS BY MOUTH PRIOR TO DENTAL APPOINTMENTS   aspirin EC 81 MG tablet Take 81 mg by mouth daily. Swallow whole.   atorvastatin (LIPITOR) 10 MG tablet Take 10 mg by mouth at bedtime.    buPROPion (WELLBUTRIN XL) 300 MG 24 hr tablet Take 300 mg by mouth daily after breakfast.   calcium carbonate (OS-CAL) 600 MG TABS tablet Take 600 mg by mouth 2 (two) times daily with a meal.   cholecalciferol (VITAMIN D3) 25 MCG (1000 UNIT) tablet Take 1,000 Units by mouth in the morning and at bedtime.   DULoxetine (CYMBALTA) 30 MG capsule Take 90 mg by mouth daily after breakfast.   famotidine (PEPCID) 20 MG tablet Take 20 mg by mouth daily.   gabapentin (NEURONTIN) 300 MG capsule Take 300 mg by mouth 3 (three) times daily.   lidocaine (LIDODERM) 5 % Place 1 patch onto the skin daily. Remove & Discard patch within 12 hours or as directed by MD.  Apply to site of pain   metoprolol succinate (TOPROL XL) 25 MG 24 hr tablet Take 1.5 tablets (37.5 mg total) by mouth daily. Take an extra tablet (25 mg) for breakthrough palpitations.   vitamin B-12 (CYANOCOBALAMIN) 1000 MCG tablet Take 1,000 mcg by mouth daily.     Allergies:   Sulfa antibiotics, Ativan [lorazepam], and Tizanidine hcl   Social History   Socioeconomic History   Marital status: Divorced    Spouse name: Not on file   Number of children: Not on file   Years of education: Not on file   Highest education level: Not on file  Occupational History   Not on file  Tobacco Use   Smoking status: Never   Smokeless tobacco: Never  Vaping Use   Vaping Use:  Never used  Substance and Sexual Activity   Alcohol use: No   Drug use: No   Sexual activity: Not on file  Other Topics Concern   Not on file  Social History Narrative   Not on file   Social Determinants of Health   Financial Resource Strain: Not on file  Food Insecurity: Not on file  Transportation Needs: Not on file  Physical Activity: Not on file  Stress: Not on file  Social Connections: Not on file     Family History: The patient's family history includes Colon cancer in her mother; Heart attack in her brother; Hypertension in her brother. There is no history of Stroke.  ROS:   Review of Systems  Constitutional: Negative.   HENT: Negative.    Eyes: Negative.   Respiratory: Negative.    Cardiovascular: Negative.   Gastrointestinal: Negative.   Genitourinary: Negative.   Musculoskeletal: Negative.   Skin: Negative.   Neurological: Negative.   Endo/Heme/Allergies: Negative.   Psychiatric/Behavioral: Negative.      Please see the history of present illness.    All other systems reviewed and are negative.  EKGs/Labs/Other Studies Reviewed:    The following studies were reviewed today:   EKG:  EKG is not ordered today.   2D Echo Complete Bubble Study on 09/24/2021:  1. Left ventricular ejection  fraction, by estimation, is 60 to 65%. The  left ventricle has normal function. The left ventricle has no regional  wall motion abnormalities. Left ventricular diastolic parameters are  indeterminate. Elevated left ventricular  end-diastolic pressure.   2. Right ventricular systolic function is normal. The right ventricular  size is normal. There is mildly elevated pulmonary artery systolic  pressure. The estimated right ventricular systolic pressure is 40.8 mmHg.   3. The mitral valve is normal in structure. Mild mitral valve  regurgitation. No evidence of mitral stenosis.   4. The aortic valve has been repaired/replaced. There is a 29 mm  CoreValve-Evolut Pro prosthetic, stented (TAVR) valve present in the  aortic position.      Aortic valve regurgitation is not visualized. No aortic stenosis is  present. Aortic valve area, by VTI measures 3.48 cm. Aortic valve mean  gradient measures 5.0 mmHg. Aortic valve Vmax measures 1.46 m/s.   5. The inferior vena cava is dilated in size with >50% respiratory  variability, suggesting right atrial pressure of 8 mmHg.   Conclusion(s)/Recommendation(s): No intracardiac source of embolism  detected on this transthoracic study. Consider a transesophageal  echocardiogram to exclude cardiac source of embolism if clinically  indicated.  Coronary CTA on 03/02/2021: 1. Tricuspid aortic valve with bulky calcification of the NCC.   2. Annular measurements in between 23/26 mm S3 TAVR (435 mm2). Measurements do support a 29 mm Evolut Pro. Would recommend structural heart team discussion for valve size.   3. No significant annular or subannular calcifications.   4. Sufficient coronary to annulus distance.   5. Optimal Fluoroscopic Angle for Delivery: LAO 15 CRA 5   6. Large hiatal hernia.  Right and left heart cath and coronary angiography on 02/17/2021: 1.  Normal right dominant coronary artery circulation. 2.  Mean RA of 2 mmHg and mean wedge of 6  mmHg with cardiac output of 7.4 L/min and cardiac index of 4.0 L/min/m 3.  Right upper extremity tortuosity.   Recent Labs: 03/12/2021: B Natriuretic Peptide 155.7 09/23/2021: ALT 8 09/24/2021: TSH 1.604 12/07/2021: BUN 33; Creatinine, Ser 1.59; Hemoglobin 14.3; Magnesium 1.9; Platelets 393; Potassium 4.8;  Sodium 137  Recent Lipid Panel    Component Value Date/Time   CHOL 136 09/23/2021 0455   TRIG 64 09/23/2021 0455   HDL 59 09/23/2021 0455   CHOLHDL 2.3 09/23/2021 0455   VLDL 13 09/23/2021 0455   LDLCALC 64 09/23/2021 0455    Physical Exam:    VS:  BP 136/78   Pulse 78   Ht _0  (1.6 m)   Wt 177 lb (80.3 kg)   SpO2 97%   BMI 31.35 kg/m     Wt Readings from Last 3 Encounters:  12/21/21 177 lb (80.3 kg)  10/04/21 175 lb (79.4 kg)  09/22/21 183 lb (83 kg)     GEN: Well nourished, well developed in no acute distress HEENT: Normal NECK: No JVD; No carotid bruits CARDIAC: S1/S2, RRR, no murmurs, rubs, gallops; 2+ peripheral pulses throughout, strong and equal bilaterally RESPIRATORY:  Clear and diminished to auscultation without rales, wheezing or rhonchi  ABDOMEN: Soft, non-tender, non-distended MUSCULOSKELETAL:  No edema; No deformity  SKIN: Thin skin, warm and dry NEUROLOGIC:  Alert and oriented x 3 PSYCHIATRIC:  Normal affect   ASSESSMENT:    1. PAT (paroxysmal atrial tachycardia)   2. SVT (supraventricular tachycardia)   3. Nonrheumatic aortic valve stenosis   4. S/P TAVR (transcatheter aortic valve replacement)   5. Tricuspid valve insufficiency, unspecified etiology   6. Hypertension, unspecified type   7. Hyperlipidemia, unspecified hyperlipidemia type   8. Stage 3b chronic kidney disease (CKD) (HCC)    PLAN:    In order of problems listed above:  PAT, SVT Denies any recurrent fast heart rates or palpitations.  Continue Toprol-XL 37.5 mg daily.  May take an extra tablet (25 mg) for breakthrough palpitations. Heart healthy diet and regular  cardiovascular exercise encouraged.   2.  Aortic stenosis, status post TAVR, TR Denies any issues.  Well-functioning replaced aortic valve seen in echo in August 2023.  Continue to follow-up with structural heart team.  She has 2D echo scheduled for next February.  Went over SBE prophylaxis with her, will add amoxicillin 2 g one-time dose 60 minutes prior to dental procedure to her medication list.  She states she has this medication as Dr. Radford [name] filled this previously.  TTE 09/2021 revealed mild TR. Heart healthy diet and regular cardiovascular exercise encouraged.   3.  Hypertension BP today 136/78.  BP well controlled at home.  Continue Toprol-XL. Heart healthy diet and regular cardiovascular exercise encouraged.     4.  Hyperlipidemia Recent labs r8/2023 revealed total cholesterol 136, HDL 59, LDL 64, triglycerides 64.  Continue Lipitor 10 mg daily. Heart healthy diet and regular cardiovascular exercise encouraged.   5.  CKD stage IIIb Most recent serum creatinine was 1.6 with a eGFR 33.  Because this is the lowest the EGFR has been since about 9 months ago, she is agreeable for me to arrange nephrology referral.  We will place this referral today.  6.  Disposition: Follow-up with structural heart team in February 2024 , follow-up with Dr. Radford [name] in 6 months.  Medication Adjustments/Labs and Tests Ordered: Current medicines are reviewed at length with the patient today.  Concerns regarding medicines are outlined above.  No orders of the defined types were placed in this encounter.  No orders of the defined types were placed in this encounter.   Patient Instructions  Medication Instructions:  Your physician recommends that you continue on your current medications as directed. Please refer to the Current Medication list given  to you today.  *If you need a refill on your cardiac medications before your next appointment, please call your pharmacy*   Lab Work: None ordered  If you  have labs (blood work) drawn today and your tests are completely normal, you will receive your results only by: [place] (if you have MyChart) OR A paper copy in the mail If you have any lab test that is abnormal or we need to change your treatment, we will call you to review the results.   Testing/Procedures: None ordered   Follow-Up: At [facility], you and your health needs are our priority.  As part of our continuing mission to provide you with exceptional heart care, we have created designated Provider Care Teams.  These Care Teams include your primary Cardiologist (physician) and Advanced Practice Providers (APPs -  Physician Assistants and Nurse Practitioners) who all work together to provide you with the care you need, when you need it.  We recommend signing up for the patient portal called "MyChart".  Sign up information is provided on this After Visit Summary.  MyChart is used to connect with patients for Virtual Visits (Telemedicine).  Patients are able to view lab/test results, encounter notes, upcoming appointments, etc.  Non-urgent messages can be sent to your provider as well.   To learn more about what you can do with MyChart, go to [email or web address].    Your next appointment:   6 month(s)  The format for your next appointment:   In Person  Provider:   Fransico Him, MD     Other Instructions   Important Information About Sugar         Signed, Finis Bud, NP  12/21/2021 10:08 AM    [place]

## 2021-12-21 NOTE — Patient Instructions (Signed)
Medication Instructions:  Your physician recommends that you continue on your current medications as directed. Please refer to the Current Medication list given to you today.  *If you need a refill on your cardiac medications before your next appointment, please call your pharmacy*   Lab Work: None ordered  If you have labs (blood work) drawn today and your tests are completely normal, you will receive your results only by: MyChart Message (if you have MyChart) OR A paper copy in the mail If you have any lab test that is abnormal or we need to change your treatment, we will call you to review the results.   Testing/Procedures: None ordered   Follow-Up: At [facility], you and your health needs are our priority.  As part of our continuing mission to provide you with exceptional heart care, we have created designated Provider Care Teams.  These Care Teams include your primary Cardiologist (physician) and Advanced Practice Providers (APPs -  Physician Assistants and Nurse Practitioners) who all work together to provide you with the care you need, when you need it.  We recommend signing up for the patient portal called "MyChart".  Sign up information is provided on this After Visit Summary.  MyChart is used to connect with patients for Virtual Visits (Telemedicine).  Patients are able to view lab/test results, encounter notes, upcoming appointments, etc.  Non-urgent messages can be sent to your provider as well.   To learn more about what you can do with MyChart, go to [email or web address].    Your next appointment:   6 month(s)  The format for your next appointment:   In Person  Provider:   Traci Turner, MD     Other Instructions   Important Information About Sugar       

## 2022-02-22 ENCOUNTER — Emergency Department (HOSPITAL_COMMUNITY)
Admission: EM | Admit: 2022-02-22 | Discharge: 2022-02-22 | Disposition: A | Discharge status: Home / Self Care | Payer: Medicare PPO | Attending: Emergency Medicine | Admitting: Emergency Medicine

## 2022-02-22 ENCOUNTER — Encounter (HOSPITAL_COMMUNITY): Payer: Self-pay

## 2022-02-22 ENCOUNTER — Emergency Department (HOSPITAL_COMMUNITY): Payer: Medicare PPO

## 2022-02-22 ENCOUNTER — Telehealth: Payer: Self-pay | Admitting: Cardiology

## 2022-02-22 DIAGNOSIS — I471 Supraventricular tachycardia, unspecified: Secondary | ICD-10-CM | POA: Diagnosis not present

## 2022-02-22 DIAGNOSIS — R000 Tachycardia, unspecified: Secondary | ICD-10-CM | POA: Diagnosis not present

## 2022-02-22 DIAGNOSIS — N1832 Chronic kidney disease, stage 3b: Secondary | ICD-10-CM | POA: Insufficient documentation

## 2022-02-22 DIAGNOSIS — Z7982 Long term (current) use of aspirin: Secondary | ICD-10-CM | POA: Diagnosis not present

## 2022-02-22 DIAGNOSIS — K449 Diaphragmatic hernia without obstruction or gangrene: Secondary | ICD-10-CM | POA: Diagnosis not present

## 2022-02-22 DIAGNOSIS — Z79899 Other long term (current) drug therapy: Secondary | ICD-10-CM | POA: Insufficient documentation

## 2022-02-22 DIAGNOSIS — R0789 Other chest pain: Secondary | ICD-10-CM | POA: Diagnosis not present

## 2022-02-22 DIAGNOSIS — I129 Hypertensive chronic kidney disease with stage 1 through stage 4 chronic kidney disease, or unspecified chronic kidney disease: Secondary | ICD-10-CM | POA: Diagnosis not present

## 2022-02-22 DIAGNOSIS — I10 Essential (primary) hypertension: Secondary | ICD-10-CM | POA: Diagnosis not present

## 2022-02-22 LAB — BASIC METABOLIC PANEL
Anion gap: 6 (ref 5–15)
BUN: 22 mg/dL (ref 8–23)
CO2: 21 mmol/L — ABNORMAL LOW (ref 22–32)
Calcium: 7.8 mg/dL — ABNORMAL LOW (ref 8.9–10.3)
Chloride: 113 mmol/L — ABNORMAL HIGH (ref 98–111)
Creatinine, Ser: 1.06 mg/dL — ABNORMAL HIGH (ref 0.44–1.00)
GFR, Estimated: 54 mL/min — ABNORMAL LOW (ref >60)
Glucose, Bld: 85 mg/dL (ref 70–99)
Potassium: 3.6 mmol/L (ref 3.5–5.1)
Sodium: 140 mmol/L (ref 135–145)

## 2022-02-22 LAB — CBC
HCT: 38.1 % (ref 36.0–46.0)
Hemoglobin: 12.2 g/dL (ref 12.0–15.0)
MCH: 30.7 pg (ref 26.0–34.0)
MCHC: 32 g/dL (ref 30.0–36.0)
MCV: 95.7 fL (ref 80.0–100.0)
Platelets: 299 10*3/uL (ref 150–400)
RBC: 3.98 MIL/uL (ref 3.87–5.11)
RDW: 13 % (ref 11.5–15.5)
WBC: 8.1 10*3/uL (ref 4.0–10.5)
nRBC: 0 % (ref 0.0–0.2)

## 2022-02-22 MED ORDER — SODIUM CHLORIDE 0.9% FLUSH: 3.0000 mL | Freq: Once | INTRAVENOUS | Status: DC

## 2022-02-22 NOTE — ED Notes (Signed)
Patient given disposable scrubs and shoes.

## 2022-02-22 NOTE — Telephone Encounter (Signed)
STAT if HR is under 50 or over 120 (normal HR is 60-100 beats per minute)  What is your heart rate? 151  Do you have a log of your heart rate readings (document readings)? HR 145 this morning   Do you have any other symptoms? Nausea, tightness in chest, and headache  Has taken two extra metoprolol it hasn't helped/

## 2022-02-22 NOTE — Telephone Encounter (Signed)
Spoke with pt who complains of HR-151 with chest tightness, nausea and dizziness.  Pt states symptoms started this morning so she took an extra Metoprolol and 3 hours later took another one.  HR still remains above 150.   Pt advised with current symptoms she should call EMS for further evaluation in the ED.  Pt advised to not drive herself.  Pt verbalizes understanding and agrees with current plan.

## 2022-02-22 NOTE — ED Triage Notes (Signed)
Pt coming from home c/o increased HR, chest tightness. HR in the 160's in scene. Pt converted after being given 6 and 12 mg of adenosine @ 1548. Pt had taken a total of 87.5 mg of metoprolol previous. Pt has had previous dx of high heart but is unsure if she has had SVT before.

## 2022-02-22 NOTE — ED Provider Notes (Addendum)
[facility] EMERGENCY DEPARTMENT Provider Note   CSN: [id number] Arrival date & time: 02/22/22  1634     History  Chief Complaint  Patient presents with   Tachycardia    Nicole Graves is a 77 y.o. female.  HPI Patient has history of SVT.  Woke up reportedly feeling bad today.  Found to have fast heart rate in 160s.  Had been given 6 then 12 mg adenosine.  Had taken extra metoprolol today to help break out of it since she has had this before.  Feeling much better now now that her heart rate has come down.  No chest pain.  No fevers or chills.  No cough.   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 Lynnette Caffey and Dr. Laneta Simmers   Severe aortic stenosis    Stage 3b chronic kidney disease (CKD) (HCC)    SVT (supraventricular tachycardia)    TR (tricuspid regurgitation) 09/19/2016   mild by echo 09/2021    Home Medications Prior to Admission medications   Medication Sig Start Date End Date Taking? Authorizing Provider  acetaminophen (TYLENOL) 650 MG CR tablet Take 1,300 mg by mouth in the morning and at bedtime.    [provider]  alendronate (FOSAMAX) 70 MG tablet Take 1 tablet by mouth every Thursday.    [provider]  amoxicillin (AMOXIL) 500 MG tablet Take by mouth. TAKE 4 TABLETS BY MOUTH PRIOR TO DENTAL APPOINTMENTS    [provider]  aspirin EC 81 MG tablet Take 81 mg by mouth daily. Swallow whole.    [provider]  atorvastatin (LIPITOR) 10 MG tablet Take 10 mg by mouth at bedtime.  02/11/15   [provider]  buPROPion (WELLBUTRIN XL) 300 MG 24 hr tablet Take 300 mg by mouth daily after breakfast.    [provider]  calcium carbonate (OS-CAL) 600 MG TABS tablet Take 600 mg by mouth 2 (two) times daily with a meal.    [provider]  cholecalciferol (VITAMIN D3) 25 MCG (1000 UNIT) tablet Take 1,000 Units by mouth in the morning and at bedtime.    [provider]  DULoxetine (CYMBALTA) 30 MG capsule Take 90 mg by mouth daily after breakfast.    [provider]  famotidine (PEPCID) 20 MG tablet Take 20 mg by mouth daily.    [provider]  gabapentin (NEURONTIN) 300 MG capsule Take 300 mg by mouth 3 (three) times daily. 12/20/19   [provider]  lidocaine (LIDODERM) 5 % Place 1 patch onto the skin daily. Remove & Discard patch within 12 hours or as directed by MD. Apply to site of pain 09/27/21   Pokhrel, Rebekah Chesterfield, MD  metoprolol succinate (TOPROL XL) 25 MG 24 hr tablet Take 1.5 tablets (37.5 mg total) by mouth daily. Take an extra tablet (25 mg) for  breakthrough palpitations. 12/08/21   [name], MD  vitamin B-12 (CYANOCOBALAMIN) 1000 MCG tablet Take 1,000 mcg by mouth daily.    [provider]      Allergies    Sulfa antibiotics, Ativan [lorazepam], and Tizanidine hcl    Review of Systems   Review of Systems  Physical Exam Updated Vital Signs BP 97/62   Pulse 73   Temp 98.6 F (37 C) (Oral)   Resp 17   Ht 5\' 4"  (1.626 m)   Wt 81.2 kg   SpO2 100%   BMI 30.73 kg/m  Physical Exam Vitals reviewed.  Cardiovascular:     Rate and Rhythm: Regular rhythm.  Pulmonary:     Breath sounds: No wheezing or rhonchi.  Abdominal:     Tenderness: There is no abdominal tenderness.  Musculoskeletal:        General: No tenderness.     Cervical back: Neck supple.  Skin:    General: Skin is warm.  Neurological:     Mental  Status: She is alert and oriented to person, place, and time.     ED Results / Procedures / Treatments   Labs (all labs ordered are listed, but only abnormal results are displayed) Labs Reviewed  BASIC METABOLIC PANEL - Abnormal; Notable for the following components:      Result Value   Chloride 113 (*)    CO2 21 (*)    Creatinine, Ser 1.06 (*)    Calcium 7.8 (*)    GFR, Estimated 54 (*)    All other components within normal limits  CBC    EKG EKG Interpretation  Date/Time:  Tuesday February 22 2022 16:55:49 EST Ventricular Rate:  82 PR Interval:  150 QRS Duration: 92 QT Interval:  347 QTC Calculation: 406 R Axis:   39 Text Interpretation: Sinus rhythm Abnormal R-wave progression, early transition Minimal ST elevation, inferior leads Artifact in lead(s) I II III aVR aVL aVF V1 V2 Confirmed by Davonna Belling [id number]) on 02/22/2022 5:34:39 PM  Radiology DG Chest 2 View  Result Date: 02/22/2022 CLINICAL DATA:  Tachycardia.  Chest tightness. EXAM: CHEST - 2 VIEW COMPARISON:  09/22/2021 FINDINGS: Mild cardiomegaly. Previous TAVR. Aortic atherosclerosis. Large hiatal hernia. Pulmonary vascularity is normal. The lungs are clear. No edema or effusions. Ordinary mild degenerative changes affect the spine. IMPRESSION: No active cardiopulmonary disease. Previous TAVR. Large hiatal hernia. Electronically Signed   By: Nelson Chimes M.D.   On: 02/22/2022 17:36    Procedures Procedures    Medications Ordered in ED Medications  sodium chloride flush (NS) 0.9 % injection 3 mL (3 mLs Intravenous Not Given 02/22/22 1707)    ED Course/ Medical Decision Making/ A&P                           Medical Decision Making Amount and/or Complexity of Data Reviewed Labs: ordered. Radiology: ordered.   Patient with SVT.  History of same.  Took metoprolol at home.  Converted with adenosine by EMS.  Feeling much better now.  Now back in sinus rhythm.  Will check some basic blood work to look for  abnormalities such as electrolyte abnormalities that could cause this.  Reviewed previous cardiology notes.  Likely will be able to discharge home.  Has remained in sinus rhythm.  Lab work reassuring.  Discharge home with outpatient follow-up with cardiology.         Final Clinical Impression(s) / ED Diagnoses Final diagnoses:  SVT (supraventricular tachycardia)  Hypocalcemia    Rx / DC Orders ED Discharge Orders     None         Benjiman Core, MD 02/22/22 1497    Benjiman Core, MD 02/22/22 2352

## 2022-02-23 ENCOUNTER — Telehealth: Payer: Self-pay | Admitting: Cardiology

## 2022-02-23 NOTE — Telephone Encounter (Signed)
Pt c/o medication issue:  1. Name of Medication:  metoprolol succinate (TOPROL XL) 25 MG 24 hr tablet  2. How are you currently taking this medication (dosage and times per day)?   3. Are you having a reaction (difficulty breathing--STAT)?   4. What is your medication issue?   Patient is following up. She rescheduled her follow-up for 1/16 with Richardson Dopp due to lack of transportation. Now she would like to know if her Metoprolol can be adjusted without her coming in for an appointment.

## 2022-02-23 NOTE — Telephone Encounter (Signed)
New message:    Patient called back to let the nurse know that she did end up going to the ER yesterday. She said her heart rate went up to 180. They did get it down to 88. She said she was told that her Metoprolol might need to be adjusted.     Pt c/o medication issue:  1. Name of Medication: Metoprolol  2. How are you currently taking this medication (dosage and times per day)?   3. Are you having a reaction (difficulty breathing--STAT)?   4. What is your medication issue? Was told at the ER on yesterday(02-22-22) that her Metoprolol might need to be adjusted

## 2022-02-23 NOTE — Telephone Encounter (Signed)
Spoke to patient who states her current HR is 88, denies SOB, palpitations or lightheadedness. Reviewed ED note, patient confirming she received adenosine from EMS yesterday after she called in to report HR of 180. Scheduled patient for visit with T. Conte on 02/25/22 as she no longer drives and needs time to find a ride. Reviewed patients current dose of metoprolol and advised patient to rest and avoid exertion until she can come in for visit. Also advised patient to rest immediately if she experiences elevated HR accompanied by SOB, palpitations, chest pain or lightheadedness. If it does not improve she verbalizes understanding to call EMS.

## 2022-02-23 NOTE — Telephone Encounter (Signed)
Appointment changed to a 02/28/22 when patient has a ride.

## 2022-02-25 ENCOUNTER — Ambulatory Visit: Payer: Medicare PPO | Admitting: Physician Assistant

## 2022-02-28 ENCOUNTER — Encounter: Payer: Self-pay | Admitting: Physician Assistant

## 2022-02-28 ENCOUNTER — Ambulatory Visit: Payer: Medicare PPO | Attending: Physician Assistant | Admitting: Physician Assistant

## 2022-02-28 VITALS — BP 138/78 | HR 85 | Ht 64.0 in | Wt 182.8 lb

## 2022-02-28 DIAGNOSIS — I471 Supraventricular tachycardia, unspecified: Secondary | ICD-10-CM | POA: Diagnosis not present

## 2022-02-28 DIAGNOSIS — I10 Essential (primary) hypertension: Secondary | ICD-10-CM

## 2022-02-28 DIAGNOSIS — I071 Rheumatic tricuspid insufficiency: Secondary | ICD-10-CM

## 2022-02-28 DIAGNOSIS — E782 Mixed hyperlipidemia: Secondary | ICD-10-CM

## 2022-02-28 DIAGNOSIS — I4719 Other supraventricular tachycardia: Secondary | ICD-10-CM

## 2022-02-28 DIAGNOSIS — N1832 Chronic kidney disease, stage 3b: Secondary | ICD-10-CM | POA: Diagnosis not present

## 2022-02-28 DIAGNOSIS — Z952 Presence of prosthetic heart valve: Secondary | ICD-10-CM

## 2022-02-28 DIAGNOSIS — I350 Nonrheumatic aortic (valve) stenosis: Secondary | ICD-10-CM

## 2022-02-28 MED ORDER — METOPROLOL SUCCINATE ER 50 MG PO TB24: 50.0000 mg | ORAL_TABLET | Freq: Every day | ORAL | 3 refills | Status: DC

## 2022-02-28 NOTE — Patient Instructions (Addendum)
Medication Instructions:  Increase metoprolol succinate (Toprol XL) to 50 mg daily *If you need a refill on your cardiac medications before your next appointment, please call your pharmacy*   Lab Work: None ordered If you have labs (blood work) drawn today and your tests are completely normal, you will receive your results only by: [place] (if you have MyChart) OR A paper copy in the mail If you have any lab test that is abnormal or we need to change your treatment, we will call you to review the results.   Follow-Up: At [facility], you and your health needs are our priority.  As part of our continuing mission to provide you with exceptional heart care, we have created designated Provider Care Teams.  These Care Teams include your primary Cardiologist (physician) and Advanced Practice Providers (APPs -  Physician Assistants and Nurse Practitioners) who all work together to provide you with the care you need, when you need it.   Your next appointments:   Echo on 03/18/22 at 10:30 AM Structural heart appt. 03/18/22 at 11:30 AM Dr Radford [name] appt. 06/28/22 at 9:20 AM  Cardiac Ablation Cardiac ablation is a procedure to destroy, or ablate, a small amount of heart tissue that is causing problems. The heart has many electrical connections. Sometimes, these connections are abnormal and can cause the heart to beat very fast or irregularly. Ablating the abnormal areas can improve the heart's rhythm or return it to normal. Ablation may be done for people who: Have irregular or rapid heartbeats (arrhythmias). Have Wolff-Parkinson-White syndrome. Have taken medicines for an arrhythmia that did not work or caused side effects. Have a high-risk heartbeat that may be life-threatening. Tell a health care provider about: Any allergies you have. All medicines you are taking, including vitamins, herbs, eye drops, creams, and over-the-counter medicines. Any problems you or family members have  had with anesthesia. Any bleeding problems you have. Any surgeries you have had. Any medical conditions you have. Whether you are pregnant or may be pregnant. What are the risks? Your health care provider will talk with you about risks. These may include: Infection. Bruising and bleeding. Stroke or blood clots. Damage to nearby structures or organs. Allergic reaction to medicines or dyes. Needing a pacemaker if the heart gets damaged. A pacemaker is a device that helps the heart beat normally. Failure of the procedure. A repeat procedure may be needed. What happens before the procedure? Medicines Ask your health care provider about: Changing or stopping your regular medicines. These include any heart rhythm medicines, diabetes medicines, or blood thinners you take. Taking medicines such as aspirin and ibuprofen. These medicines can thin your blood. Do not take them unless your health care provider tells you to. Taking over-the-counter medicines, vitamins, herbs, and supplements. General instructions Follow instructions from your health care provider about what you may eat and drink. If you will be going home right after the procedure, plan to have a responsible adult: Take you home from the hospital or clinic. You will not be allowed to drive. Care for you for the time you are told. Ask your health care provider what steps will be taken to prevent infection. What happens during the procedure?  An IV will be inserted into one of your veins. You may be given: A sedative. This helps you relax. Anesthesia. This will: Numb certain areas of your body. An incision will be made in your neck or your groin. A needle will be inserted through the incision and  into a large vein in your neck or groin. The small, thin tube (catheter) will be inserted through the needle and moved to your heart. A type of X-ray (fluoroscopy) will be used to help guide the catheter and provide images of the heart  on a monitor. Dye may be injected through the catheter to help your surgeon see the area of the heart that needs treatment. Electrical currents will be sent from the catheter to destroy heart tissue in certain areas. There are three types of energy that may be used to do this: Heat (radiofrequency energy). Laser energy. Extreme cold (cryoablation). When the tissue has been destroyed, the catheter will be removed. Pressure will be held on the insertion area to prevent bleeding. A bandage (dressing) will be placed over the insertion area. The procedure may vary among health care providers and hospitals. What happens after the procedure? Your blood pressure, heart rate and rhythm, breathing rate, and blood oxygen level will be monitored until you leave the hospital or clinic. Your insertion area will be checked for bleeding. You will need to lie still for a few hours. If your groin was used, you will need to keep your leg straight for a few hours after the catheter is removed. This information is not intended to replace advice given to you by your health care provider. Make sure you discuss any questions you have with your health care provider. Document Revised: 07/20/2021 Document Reviewed: 07/20/2021 Elsevier Patient Education  [place].

## 2022-02-28 NOTE — Progress Notes (Signed)
Office Visit    Patient Name: Nicole Graves Date of Encounter: 02/28/2022  PCP:  Kathyrn Lass, MD   [facility]  Cardiologist:  Fransico Him, MD  Advanced Practice Provider:  No care team member to display Electrophysiologist:  None   HPI    Nicole Graves is a 77 y.o. female with past medical history significant for anxiety, anemia, depression, hypertension, hyperlipidemia, paroxysmal atrial tachycardia, mild mitral regurgitation, mild aortic insufficiency, severe aortic stenosis status post TAVR 03/16/2021, stage IIIb CKD, SVT, mild TR presents today for hospital follow-up visit.  The patient was recently in the ED 02/22/2022 and had reportedly woke up that morning feeling bad.  Found to have a heart rate in the 160s.  She was given 6 mg then 12 mg of adenosine.  She had taken an extra metoprolol to help break her out of the arrhythmia.  Was feeling much better now that her heart rate had come down.  No chest pain.  No fever/chills.  No cough.  She was seen at our [place] location December 07, 2021 with a chief complaint of tachycardia.  She went to her primary care provider and was found to have a heart rate in the 160s.  Was not specifically symptomatic at that time.  It had been going on for about 48 hours.  Twelve-lead EKG revealed SVT with nonspecific ST and T wave abnormalities, 167 bpm.  Patient was converted back to normal sinus rhythm via metoprolol given IV.  Electrolytes normal, chest x-ray without anything acute.  She was seen in the clinic by Finis Bud, NP 12/21/2021 and had been doing well.  She had an episode of hallucinations while on [name].  Stopped several medications.  Had not had any palpitations or symptoms.  Went to the ED and received metoprolol at the time of tachycardia event.  Denied chest pain, shortness of breath, syncope, presyncope, dizziness, orthopnea, PND, bleeding, or claudication.  Today, she states that she has SVT about twice a  year.  The most recent time she was in the ER and converted with adenosine.  Her blood pressure is well-controlled today.  We discussed increasing her beta-blocker for better heart rate control.  We did briefly discuss SVT ablation as an option if she is not well-controlled with medication.  Otherwise, doing well from a CV standpoint.  Reports no shortness of breath nor dyspnea on exertion. Reports no chest pain, pressure, or tightness. No edema, orthopnea, PND.   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 Ali Lowe and Dr. Cyndia Bent   Severe aortic stenosis    Stage 3b chronic kidney disease (CKD) ([place])    SVT (supraventricular tachycardia)    TR (tricuspid regurgitation) 09/19/2016   mild by echo 09/2021   Past Surgical History:  Procedure Laterality Date  CESAREAN SECTION  1964   X2   CHOLECYSTECTOMY     COSMETIC SURGERY  1980   removal extra fat inner thighs   EYE SURGERY Bilateral     Cataract extraction with IOL   INTRAOPERATIVE TRANSTHORACIC ECHOCARDIOGRAM N/A 03/16/2021   Procedure: INTRAOPERATIVE TRANSTHORACIC ECHOCARDIOGRAM;  Surgeon: Early Osmond, MD;  Location: [place];  Service: Open Heart Surgery;  Laterality: N/A;   RIGHT HEART CATH AND CORONARY ANGIOGRAPHY N/A 02/17/2021   Procedure: RIGHT HEART CATH AND CORONARY ANGIOGRAPHY;  Surgeon: Early Osmond, MD;  Location: [facility];  Service: Cardiovascular;  Laterality: N/A;   TOTAL  KNEE ARTHROPLASTY Right 11/30/2015   Procedure: RIGHT TOTAL KNEE ARTHROPLASTY;  Surgeon: Gaynelle Arabian, MD;  Location: [facility];  Service: Orthopedics;  Laterality: Right;   TOTAL KNEE ARTHROPLASTY Left 11/14/2016   Procedure: LEFT TOTAL KNEE ARTHROPLASTY;  Surgeon: Gaynelle Arabian, MD;  Location: [facility];  Service: Orthopedics;  Laterality: Left;   TRANSCATHETER AORTIC VALVE REPLACEMENT, TRANSFEMORAL Right 03/16/2021   Procedure: TRANSCATHETER AORTIC VALVE REPLACEMENT, TRANSFEMORAL;  Surgeon: Early Osmond, MD;  Location: [place];  Service: Open Heart Surgery;  Laterality: Right;    Allergies  Allergies  Allergen Reactions   Sulfa Antibiotics Nausea And Vomiting and Other (See Comments)   Ativan [Lorazepam] Other (See Comments)    Hyperactive    Tizanidine Hcl Other (See Comments)    Pt does not remember      EKGs/Labs/Other Studies Reviewed:   The following studies were reviewed today:  Echocardiogram 09/24/21  IMPRESSIONS     1. Left ventricular ejection fraction, by estimation, is 60 to 65%. The  left ventricle has normal function. The left ventricle has no regional  wall motion abnormalities. Left ventricular diastolic parameters are  indeterminate. Elevated left ventricular  end-diastolic pressure.   2. Right ventricular systolic function is normal. The right ventricular  size is normal. There is mildly elevated pulmonary artery systolic  pressure. The estimated right ventricular systolic pressure is 67.2 mmHg.   3. The mitral valve is normal in structure. Mild mitral valve  regurgitation. No evidence of mitral stenosis.   4. The aortic valve has been repaired/replaced. There is a 29 mm  CoreValve-Evolut Pro prosthetic, stented (TAVR) valve present in the  aortic position.      Aortic valve regurgitation is not visualized. No aortic stenosis is  present. Aortic valve area, by VTI measures 3.48 cm. Aortic valve mean  gradient measures 5.0 mmHg. Aortic valve Vmax measures  1.46 m/s.   5. The inferior vena cava is dilated in size with >50% respiratory  variability, suggesting right atrial pressure of 8 mmHg.   Conclusion(s)/Recommendation(s): No intracardiac source of embolism  detected on this transthoracic study. Consider a transesophageal  echocardiogram to exclude cardiac source of embolism if clinically  indicated.   FINDINGS   Left Ventricle: Left ventricular ejection fraction, by estimation, is 60  to 65%. The left ventricle has normal function. The left ventricle has no  regional wall motion abnormalities. The left ventricular internal cavity  size was normal in size. There is   no left ventricular hypertrophy. Left ventricular diastolic parameters  are indeterminate. Elevated left ventricular end-diastolic pressure.   Right Ventricle: The right ventricular size is normal. No increase in  right ventricular wall thickness. Right ventricular systolic function is  normal. There is mildly elevated pulmonary artery systolic pressure. The  tricuspid regurgitant velocity is 2.69   m/s, and with an assumed right atrial pressure  of 8 mmHg, the estimated  right ventricular systolic pressure is 44.0 mmHg.   Left Atrium: Left atrial size was normal in size.   Right Atrium: Right atrial size was normal in size.   Pericardium: There is no evidence of pericardial effusion.   Mitral Valve: The mitral valve is normal in structure. Mild to moderate  mitral annular calcification. Mild mitral valve regurgitation. No evidence  of mitral valve stenosis.   Tricuspid Valve: The tricuspid valve is normal in structure. Tricuspid  valve regurgitation is mild . No evidence of tricuspid stenosis.   Aortic Valve: The aortic valve has been repaired/replaced. Aortic valve  regurgitation is not visualized. No aortic stenosis is present. Aortic  valve mean gradient measures 5.0 mmHg. Aortic valve peak gradient measures  8.5 mmHg. Aortic valve area, by VTI  measures 3.48  cm. There is a 29 mm CoreValve-Evolut Pro prosthetic,  stented (TAVR) valve present in the aortic position.   Pulmonic Valve: The pulmonic valve was normal in structure. Pulmonic valve  regurgitation is trivial. No evidence of pulmonic stenosis.   Aorta: The aortic root is normal in size and structure.   Venous: The inferior vena cava is dilated in size with greater than 50%  respiratory variability, suggesting right atrial pressure of 8 mmHg.   IAS/Shunts: No atrial level shunt detected by color flow Doppler. Agitated  saline contrast was given intravenously to evaluate for intracardiac  shunting.   EKG:  EKG is not ordered today.   Recent Labs: 03/12/2021: B Natriuretic Peptide 155.7 09/23/2021: ALT 8 09/24/2021: TSH 1.604 12/07/2021: Magnesium 1.9 02/22/2022: BUN 22; Creatinine, Ser 1.06; Hemoglobin 12.2; Platelets 299; Potassium 3.6; Sodium 140  Recent Lipid Panel    Component Value Date/Time   CHOL 136 09/23/2021 0455   TRIG 64 09/23/2021 0455   HDL 59 09/23/2021 0455   CHOLHDL 2.3 09/23/2021 0455   VLDL 13 09/23/2021 0455   LDLCALC 64 09/23/2021 0455    Home Medications   Current Meds  Medication Sig   acetaminophen (TYLENOL) 650 MG CR tablet Take 1,300 mg by mouth in the morning and at bedtime.   alendronate (FOSAMAX) 70 MG tablet Take 1 tablet by mouth every Thursday.   amoxicillin (AMOXIL) 500 MG tablet Take by mouth. TAKE 4 TABLETS BY MOUTH PRIOR TO DENTAL APPOINTMENTS   aspirin EC 81 MG tablet Take 81 mg by mouth daily. Swallow whole.   atorvastatin (LIPITOR) 10 MG tablet Take 10 mg by mouth at bedtime.    buPROPion (WELLBUTRIN XL) 300 MG 24 hr tablet Take 300 mg by mouth daily after breakfast.   calcium carbonate (OS-CAL) 600 MG TABS tablet Take 600 mg by mouth 2 (two) times daily with a meal.   cholecalciferol (VITAMIN D3) 25 MCG (1000 UNIT) tablet Take 1,000 Units by mouth in the morning and at bedtime.   DULoxetine (CYMBALTA) 30 MG capsule Take 90 mg by mouth  daily after breakfast.   famotidine (PEPCID) 20 MG tablet Take 20 mg by mouth daily.   Ferrous Sulfate (IRON) 325 (65 Fe) MG TABS Take 1 tablet by mouth daily at 6 (six) AM.   gabapentin (NEURONTIN) 300 MG capsule Take 300 mg by mouth 3 (three) times daily.   lidocaine (LIDODERM) 5 % Place 1 patch onto the skin daily. Remove & Discard patch within 12 hours or as directed by MD. Apply to site of pain   Melatonin 5 MG CAPS Take 1 capsule by mouth at bedtime.   metoprolol succinate (TOPROL-XL)  50 MG 24 hr tablet Take 1 tablet (50 mg total) by mouth daily. Take with or immediately following a meal.   vitamin B-12 (CYANOCOBALAMIN) 1000 MCG tablet Take 1,000 mcg by mouth daily.   [DISCONTINUED] metoprolol succinate (TOPROL XL) 25 MG 24 hr tablet Take 1.5 tablets (37.5 mg total) by mouth daily. Take an extra tablet (25 mg) for breakthrough palpitations.     Review of Systems      All other systems reviewed and are otherwise negative except as noted above.  Physical Exam    VS:  BP 138/78   Pulse 85   Ht 5\' 4"  (1.626 m)   Wt 182 lb 12.8 oz (82.9 kg)   SpO2 96%   BMI 31.38 kg/m  , BMI Body mass index is 31.38 kg/m.  Wt Readings from Last 3 Encounters:  02/28/22 182 lb 12.8 oz (82.9 kg)  02/22/22 179 lb (81.2 kg)  12/21/21 177 lb (80.3 kg)     GEN: Well nourished, well developed, in no acute distress. HEENT: normal. Neck: Supple, no JVD, carotid bruits, or masses. Cardiac: RRR, + systolic murmur, rubs, or gallops. No clubbing, cyanosis, edema.  Radials/PT 2+ and equal bilaterally.  Respiratory:  Respirations regular and unlabored, clear to auscultation bilaterally. GI: Soft, nontender, nondistended. MS: No deformity or atrophy. Skin: Warm and dry, no rash. Neuro:  Strength and sensation are intact. Psych: Normal affect.  Assessment & Plan    PAT/SVT -increase metoprolol succinate to 50mg  daily  -Increase hydration to 64 ounces daily -Avoid caffeine -recent labs reviewed  Mild  MR -echo scheduled next month -currently asymptomatic -+ murmur on exam  Aortic stenosis status post TAVR -she has an echo next months and follow-up with structural -she has not had any symptoms  Hypertension -well controlled today -we did increased her metoprolol succinate -continue to track at home (she needs to get a new cuff that's easier to use)  Hyperlipidemia -LDL 64 -continue lipitor 10mg  daily  CKD stage IIIb -creatinine 1.06 -continue to monitor         Disposition: Follow up 3 months with 13/07/23, MD or APP.  Signed, , PA-C 02/28/2022, 5:03 PM [facility]

## 2022-03-01 ENCOUNTER — Ambulatory Visit: Payer: Medicare PPO | Admitting: Physician Assistant

## 2022-03-16 ENCOUNTER — Ambulatory Visit: Payer: Medicare PPO

## 2022-03-17 ENCOUNTER — Other Ambulatory Visit (HOSPITAL_COMMUNITY): Payer: Medicare PPO

## 2022-03-17 NOTE — Progress Notes (Signed)
[facility] [place]                                     Cardiology Office Note:    Date:  03/18/2022   ID:  Nicole Graves, DOB [birth year], MRN [id number]  PCP:  Kathyrn Lass, MD  [facility] Cardiologist:  Fransico Him, MD / Dr. Ali Lowe, MD & Dr. Cyndia Bent, MD (TAVR) [facility] Electrophysiologist:  None   Referring MD: Kathyrn Lass, MD   1 year s/p TAVR  History of Present Illness:    Nicole Graves is a 77 y.o. 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 Post op echo showed EF 55%, normally functioning TAVR with a mean gradient of 9 mmHg and trivial paravalvular leak at the 1oclock position. She was discharged on aspirin monotherapy. Of note, Pre TAVR [facility] 02/17/21 showed no CAD with right dominance.  She recently had several episodes of SVT requiring trips to the ER. She saw Nicholes Rough PA-C and BB was increased. SVT ablation was discused.   Today the patient presents to clinic for follow up. She reports that sensation of palpitations has greatly reduced since metoprolol titration. She denies SOB, chest pain, LE edema, or orthopnea symptoms. She has some mild orthostatic dizziness that resolves within seconds of standing. Discussed monitoring this closely with the increase on beta blocker.   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 Ali Lowe and Dr. Cyndia Bent   Severe aortic stenosis    Stage 3b chronic kidney disease (CKD) ([place])    SVT (supraventricular tachycardia)    TR (tricuspid regurgitation) 09/19/2016   mild by echo 09/2021    Past Surgical History:  Procedure Laterality Date   CESAREAN SECTION  1964   [place]   removal extra fat inner thighs   EYE SURGERY Bilateral     Cataract extraction with IOL   INTRAOPERATIVE TRANSTHORACIC ECHOCARDIOGRAM N/A 03/16/2021   Procedure: INTRAOPERATIVE TRANSTHORACIC ECHOCARDIOGRAM;  Surgeon: Early Osmond, MD;  Location: [place];  Service: Open Heart Surgery;  Laterality: N/A;   RIGHT HEART CATH AND CORONARY ANGIOGRAPHY N/A 02/17/2021   Procedure: RIGHT HEART CATH AND CORONARY ANGIOGRAPHY;  Surgeon: Early Osmond, MD;  Location: [place] [facility];  Service: Cardiovascular;  Laterality: N/A;   TOTAL KNEE ARTHROPLASTY Right 11/30/2015   Procedure: RIGHT TOTAL KNEE ARTHROPLASTY;  Surgeon: Gaynelle Arabian, MD;  Location: [facility];  Service: Orthopedics;  Laterality: Right;  TOTAL KNEE ARTHROPLASTY Left 11/14/2016   Procedure: LEFT TOTAL KNEE ARTHROPLASTY;  Surgeon: Ollen Gross, MD;  Location: [facility];  Service: Orthopedics;  Laterality: Left;   TRANSCATHETER AORTIC VALVE REPLACEMENT, TRANSFEMORAL Right 03/16/2021   Procedure: TRANSCATHETER AORTIC VALVE REPLACEMENT, TRANSFEMORAL;  Surgeon: Orbie Pyo, MD;  Location: [facility];  Service: Open Heart Surgery;  Laterality: Right;    Current Medications: Current Meds  Medication Sig   acetaminophen (TYLENOL) 650 MG CR tablet Take 1,300 mg by mouth in the morning and at bedtime.   alendronate (FOSAMAX) 70 MG tablet Take 1 tablet by mouth every Thursday.   amoxicillin (AMOXIL) 500 MG tablet Take by mouth. TAKE 4 TABLETS BY MOUTH PRIOR TO  DENTAL APPOINTMENTS   aspirin EC 81 MG tablet Take 81 mg by mouth daily. Swallow whole.   atorvastatin (LIPITOR) 10 MG tablet Take 10 mg by mouth at bedtime.    buPROPion (WELLBUTRIN XL) 300 MG 24 hr tablet Take 300 mg by mouth daily after breakfast.   calcium carbonate (OS-CAL) 600 MG TABS tablet Take 600 mg by mouth 2 (two) times daily with a meal.   cholecalciferol (VITAMIN D3) 25 MCG (1000 UNIT) tablet Take 1,000 Units by mouth in the morning and at bedtime.   DULoxetine (CYMBALTA) 30 MG capsule Take 90 mg by mouth daily after breakfast.   famotidine (PEPCID) 20 MG tablet Take 20 mg by mouth daily.   Ferrous Sulfate (IRON) 325 (65 Fe) MG TABS Take 1 tablet by mouth daily at 6 (six) AM.   gabapentin (NEURONTIN) 300 MG capsule Take 300 mg by mouth 3 (three) times daily.   lidocaine (LIDODERM) 5 % Place 1 patch onto the skin daily. Remove & Discard patch within 12 hours or as directed by MD. Apply to site of pain   Melatonin 5 MG CAPS Take 1 capsule by mouth at bedtime.   metoprolol succinate (TOPROL-XL) 100 MG 24 hr tablet Take 1 tablet (100 mg total) by mouth daily. Take with or immediately following a meal.   vitamin B-12 (CYANOCOBALAMIN) 1000 MCG tablet Take 1,000 mcg by mouth daily.   [DISCONTINUED] metoprolol succinate (TOPROL-XL) 50 MG 24 hr tablet Take 100 mg by mouth daily. Take with or immediately following a meal.     Allergies:   Sulfa antibiotics, Ativan [lorazepam], and Tizanidine hcl   Social History   Socioeconomic History   Marital status: Divorced    Spouse name: Not on file   Number of children: Not on file   Years of education: Not on file   Highest education level: Not on file  Occupational History   Not on file  Tobacco Use   Smoking status: Never   Smokeless tobacco: Never  Vaping Use   Vaping Use: Never used  Substance and Sexual Activity   Alcohol use: No   Drug use: No   Sexual activity: Not on file  Other Topics Concern   Not on file  Social History  Narrative   Not on file   Social Determinants of Health   Financial Resource Strain: Not on file  Food Insecurity: Not on file  Transportation Needs: Not on file  Physical Activity: Not on file  Stress: Not on file  Social Connections: Not on file     Family History: The patient's family history includes Colon cancer in her mother; Heart attack in her brother; Hypertension in her brother. There is no history of Stroke.  ROS:   Please see the history of present  illness.    All other systems reviewed and are negative.  EKGs/Labs/Other Studies Reviewed:    The following studies were reviewed today:  Echocardiogram 03/18/22:   1. S/P TAVR with mean gradient 6 mmHg and DI 0.65; mild AI; no  significant change compared to 04/16/21.   2. Left ventricular ejection fraction, by estimation, is 60 to 65%. The  left ventricle has normal function. The left ventricle has no regional  wall motion abnormalities. There is mild left ventricular hypertrophy.  Left ventricular diastolic parameters  are consistent with Grade I diastolic dysfunction (impaired relaxation).  Elevated left atrial pressure.   3. Right ventricular systolic function is normal. The right ventricular  size is normal.   4. The mitral valve is normal in structure. Mild mitral valve  regurgitation. No evidence of mitral stenosis.   5. Tricuspid valve regurgitation is mild to moderate.   6. The aortic valve has been repaired/replaced. Aortic valve  regurgitation is mild. No aortic stenosis is present. There is a 29 mm  CoreValve-Evolut Pro prosthetic (TAVR) valve present in the aortic  position. Procedure Date: 03/16/21.   7. The inferior vena cava is normal in size with greater than 50%  respiratory variability, suggesting right atrial pressure of 3 mmHg.    TAVR OPERATIVE NOTE     Date of Procedure:                03/16/2021    Preoperative Diagnosis:      Severe Aortic Stenosis    Postoperative Diagnosis:    Same     Procedure:        Transcatheter Aortic Valve Replacement - Percutaneous Transfemoral Approach             Medtronic Evolut Pro-Plus (size 29 mm, serial # [id number])              Co-Surgeons:                        Gaye Pollack, MD and Lenna Sciara MD   Anesthesiologist:                  Adele Barthel, MD   Echocardiographer:              Jenkins Rouge, MD   Pre-operative Echo Findings:  Severe aortic stenosis  Left ventricular systolic function   Post-operative Echo Findings: Trace paravalvular leak Normal left ventricular systolic function   ____________    Echo 03/17/21:   1. Left ventricular ejection fraction, by estimation, is 55 to 60%. The  left ventricle has normal function. The left ventricle has no regional  wall motion abnormalities. Left ventricular diastolic parameters are  indeterminate.   2. Right ventricular systolic function is normal. The right ventricular  size is normal. There is mildly elevated pulmonary artery systolic  pressure.   3. Left atrial size was moderately dilated.   4. The mitral valve is degenerative. Mild mitral valve regurgitation. No  evidence of mitral stenosis. Moderate mitral annular calcification.   5. Well seated bioprosthetic valve. The aortic valve has been  repaired/replaced. Aortic valve regurgitation Mild aortic regurgitation  with trivial paravalvular leak at the 1oclock position. No aortic stenosis  is present. There is a CoreValve-Evolut Pro  prosthetic (TAVR) valve present in the aortic position. Procedure Date:  03/16/2021. Aortic valve mean gradient measures 9.0 mmHg. Aortic valve Vmax  measures 2.09 m/s.   6. The inferior vena cava is normal in  size with greater than 50%  respiratory variability, suggesting right atrial pressure of 3 mmHg.   EKG:  EKG is NOT ordered today.    Recent Labs: 09/23/2021: ALT 8 09/24/2021: TSH 1.604 12/07/2021: Magnesium 1.9 02/22/2022: BUN 22; Creatinine, Ser 1.06; Hemoglobin 12.2;  Platelets 299; Potassium 3.6; Sodium 140   Recent Lipid Panel    Component Value Date/Time   CHOL 136 09/23/2021 0455   TRIG 64 09/23/2021 0455   HDL 59 09/23/2021 0455   CHOLHDL 2.3 09/23/2021 0455   VLDL 13 09/23/2021 0455   LDLCALC 64 09/23/2021 0455   Physical Exam:    VS:  BP 110/70   Pulse (!) 53   Ht 5\' 4"  (1.626 m)   Wt 178 lb (80.7 kg)   SpO2 100%   BMI 30.55 kg/m     Wt Readings from Last 3 Encounters:  03/18/22 178 lb (80.7 kg)  02/28/22 182 lb 12.8 oz (82.9 kg)  02/22/22 179 lb (81.2 kg)    General: Well developed, well nourished, NAD Lungs:Clear to ausculation bilaterally. No wheezes, rales, or rhonchi. Breathing is unlabored. Cardiovascular: RRR with S1 S2. Soft flow murmur Extremities: No edema.  Neuro: Alert and oriented. No focal deficits. No facial asymmetry. MAE spontaneously. Psych: Responds to questions appropriately with normal affect.    ASSESSMENT:    1. S/P TAVR (transcatheter aortic valve replacement)   2. SVT (supraventricular tachycardia)   3. Stage 3b chronic kidney disease (CKD) ([facility])    PLAN:    In order of problems listed above:  Severe AS s/p TAVR: echo today shows EF at 60-65%, normally functioning TAVR with a mean gradient of 1mm hg and no PVL. She has NYHA class I symptoms. Continue aspirin 81mg  daily. SBE prophylaxis discussed; I have RX'd amoxicillin. Continue regular follow up with Dr. Radford [name].     SVT: Symptoms improved with increase in BB. Continue current regimen.     CKD stage IIIa: creat last checked 02/22/22 ~1.06  Medication Adjustments/Labs and Tests Ordered: Current medicines are reviewed at length with the patient today.  Concerns regarding medicines are outlined above.  No orders of the defined types were placed in this encounter.  Meds ordered this encounter  Medications   metoprolol succinate (TOPROL-XL) 100 MG 24 hr tablet    Sig: Take 1 tablet (100 mg total) by mouth daily. Take with or immediately following a  meal.    Dispense:  90 tablet    Refill:  3    Patient Instructions  Medication Instructions:  Your physician recommends that you continue on your current medications as directed. Please refer to the Current Medication list given to you today.  *If you need a refill on your cardiac medications before your next appointment, please call your pharmacy*   Lab Work: NONE If you have labs (blood work) drawn today and your tests are completely normal, you will receive your results only by: [place] (if you have MyChart) OR A paper copy in the mail If you have any lab test that is abnormal or we need to change your treatment, we will call you to review the results.   Testing/Procedures: NONE   Follow-Up: At [facility], you and your health needs are our priority.  As part of our continuing mission to provide you with exceptional heart care, we have created designated Provider Care Teams.  These Care Teams include your primary Cardiologist (physician) and Advanced Practice Providers (APPs -  Physician Assistants and Nurse Practitioners) who  all work together to provide you with the care you need, when you need it.  We recommend signing up for the patient portal called "MyChart".  Sign up information is provided on this After Visit Summary.  MyChart is used to connect with patients for Virtual Visits (Telemedicine).  Patients are able to view lab/test results, encounter notes, upcoming appointments, etc.  Non-urgent messages can be sent to your provider as well.   To learn more about what you can do with MyChart, go to [email or web address].    Your next appointment:   KEEP SCHEDULED FOLLOW-UP    Signed, Georgie Chard, NP  03/18/2022 2:49 PM    [facility]

## 2022-03-18 ENCOUNTER — Ambulatory Visit: Payer: Medicare PPO

## 2022-03-18 ENCOUNTER — Ambulatory Visit: Payer: Medicare PPO | Attending: Physician Assistant | Admitting: Cardiology

## 2022-03-18 ENCOUNTER — Ambulatory Visit (HOSPITAL_COMMUNITY): Payer: Medicare PPO | Attending: Cardiology

## 2022-03-18 VITALS — BP 110/70 | HR 53 | Ht 64.0 in | Wt 178.0 lb

## 2022-03-18 DIAGNOSIS — I471 Supraventricular tachycardia, unspecified: Secondary | ICD-10-CM | POA: Diagnosis not present

## 2022-03-18 DIAGNOSIS — N1832 Chronic kidney disease, stage 3b: Secondary | ICD-10-CM

## 2022-03-18 DIAGNOSIS — Z952 Presence of prosthetic heart valve: Secondary | ICD-10-CM | POA: Insufficient documentation

## 2022-03-18 LAB — ECHOCARDIOGRAM COMPLETE
AR max vel: 4.05 cm2
AV Area VTI: 4.28 cm2
AV Area mean vel: 4.06 cm2
AV Mean grad: 6 mmHg
AV Peak grad: 12 mmHg
Ao pk vel: 1.73 m/s
Area-P 1/2: 3.87 cm2
Height: 64 in
MV M vel: 5.4 m/s
MV Peak grad: 116.6 mmHg
S' Lateral: 2.1 cm
Weight: 2848 oz

## 2022-03-18 MED ORDER — METOPROLOL SUCCINATE ER 100 MG PO TB24: 100.0000 mg | ORAL_TABLET | Freq: Every day | ORAL | 3 refills | Status: DC

## 2022-03-18 NOTE — Patient Instructions (Signed)
Medication Instructions:  Your physician recommends that you continue on your current medications as directed. Please refer to the Current Medication list given to you today.  *If you need a refill on your cardiac medications before your next appointment, please call your pharmacy*   Lab Work: NONE If you have labs (blood work) drawn today and your tests are completely normal, you will receive your results only by: MyChart Message (if you have MyChart) OR A paper copy in the mail If you have any lab test that is abnormal or we need to change your treatment, we will call you to review the results.   Testing/Procedures: NONE   Follow-Up: At [facility], you and your health needs are our priority.  As part of our continuing mission to provide you with exceptional heart care, we have created designated Provider Care Teams.  These Care Teams include your primary Cardiologist (physician) and Advanced Practice Providers (APPs -  Physician Assistants and Nurse Practitioners) who all work together to provide you with the care you need, when you need it.  We recommend signing up for the patient portal called "MyChart".  Sign up information is provided on this After Visit Summary.  MyChart is used to connect with patients for Virtual Visits (Telemedicine).  Patients are able to view lab/test results, encounter notes, upcoming appointments, etc.  Non-urgent messages can be sent to your provider as well.   To learn more about what you can do with MyChart, go to [email or web address].    Your next appointment:   KEEP SCHEDULED FOLLOW-UP 

## 2022-03-22 ENCOUNTER — Telehealth: Payer: Self-pay | Admitting: Cardiology

## 2022-03-22 NOTE — Telephone Encounter (Signed)
Calling for update on echo results. Please advise

## 2022-03-22 NOTE — Telephone Encounter (Signed)
Resulted her echo and sent to triage to call her. Thank you

## 2022-03-28 DIAGNOSIS — E1122 Type 2 diabetes mellitus with diabetic chronic kidney disease: Secondary | ICD-10-CM | POA: Diagnosis not present

## 2022-03-28 DIAGNOSIS — N2581 Secondary hyperparathyroidism of renal origin: Secondary | ICD-10-CM | POA: Diagnosis not present

## 2022-03-28 DIAGNOSIS — N1831 Chronic kidney disease, stage 3a: Secondary | ICD-10-CM | POA: Diagnosis not present

## 2022-03-28 DIAGNOSIS — N1832 Chronic kidney disease, stage 3b: Secondary | ICD-10-CM | POA: Diagnosis not present

## 2022-03-28 DIAGNOSIS — D631 Anemia in chronic kidney disease: Secondary | ICD-10-CM | POA: Diagnosis not present

## 2022-03-28 DIAGNOSIS — E785 Hyperlipidemia, unspecified: Secondary | ICD-10-CM | POA: Diagnosis not present

## 2022-03-28 DIAGNOSIS — I350 Nonrheumatic aortic (valve) stenosis: Secondary | ICD-10-CM | POA: Diagnosis not present

## 2022-04-01 ENCOUNTER — Other Ambulatory Visit: Payer: Self-pay | Admitting: Nephrology

## 2022-04-01 DIAGNOSIS — E785 Hyperlipidemia, unspecified: Secondary | ICD-10-CM

## 2022-04-01 DIAGNOSIS — I350 Nonrheumatic aortic (valve) stenosis: Secondary | ICD-10-CM

## 2022-04-01 DIAGNOSIS — N2581 Secondary hyperparathyroidism of renal origin: Secondary | ICD-10-CM

## 2022-04-01 DIAGNOSIS — D631 Anemia in chronic kidney disease: Secondary | ICD-10-CM

## 2022-04-01 DIAGNOSIS — E1169 Type 2 diabetes mellitus with other specified complication: Secondary | ICD-10-CM

## 2022-04-01 DIAGNOSIS — N1831 Chronic kidney disease, stage 3a: Secondary | ICD-10-CM

## 2022-04-03 ENCOUNTER — Emergency Department (HOSPITAL_BASED_OUTPATIENT_CLINIC_OR_DEPARTMENT_OTHER): Payer: Medicare PPO | Admitting: Radiology

## 2022-04-03 ENCOUNTER — Other Ambulatory Visit: Payer: Self-pay

## 2022-04-03 ENCOUNTER — Encounter (HOSPITAL_BASED_OUTPATIENT_CLINIC_OR_DEPARTMENT_OTHER): Payer: Self-pay | Admitting: Emergency Medicine

## 2022-04-03 ENCOUNTER — Emergency Department (HOSPITAL_BASED_OUTPATIENT_CLINIC_OR_DEPARTMENT_OTHER)
Admission: EM | Admit: 2022-04-03 | Discharge: 2022-04-03 | Disposition: A | Discharge status: Home / Self Care | Payer: Medicare PPO | Attending: Emergency Medicine | Admitting: Emergency Medicine

## 2022-04-03 DIAGNOSIS — Z7982 Long term (current) use of aspirin: Secondary | ICD-10-CM | POA: Insufficient documentation

## 2022-04-03 DIAGNOSIS — R002 Palpitations: Secondary | ICD-10-CM | POA: Diagnosis present

## 2022-04-03 DIAGNOSIS — I471 Supraventricular tachycardia, unspecified: Secondary | ICD-10-CM | POA: Diagnosis not present

## 2022-04-03 DIAGNOSIS — R079 Chest pain, unspecified: Secondary | ICD-10-CM | POA: Diagnosis not present

## 2022-04-03 DIAGNOSIS — K449 Diaphragmatic hernia without obstruction or gangrene: Secondary | ICD-10-CM | POA: Diagnosis not present

## 2022-04-03 LAB — BASIC METABOLIC PANEL
Anion gap: 11 (ref 5–15)
BUN: 37 mg/dL — ABNORMAL HIGH (ref 8–23)
CO2: 27 mmol/L (ref 22–32)
Calcium: 10 mg/dL (ref 8.9–10.3)
Chloride: 104 mmol/L (ref 98–111)
Creatinine, Ser: 1.45 mg/dL — ABNORMAL HIGH (ref 0.44–1.00)
GFR, Estimated: 37 mL/min — ABNORMAL LOW (ref >60)
Glucose, Bld: 112 mg/dL — ABNORMAL HIGH (ref 70–99)
Potassium: 3.9 mmol/L (ref 3.5–5.1)
Sodium: 142 mmol/L (ref 135–145)

## 2022-04-03 LAB — CBC
HCT: 38.9 % (ref 36.0–46.0)
Hemoglobin: 12.6 g/dL (ref 12.0–15.0)
MCH: 30.4 pg (ref 26.0–34.0)
MCHC: 32.4 g/dL (ref 30.0–36.0)
MCV: 94 fL (ref 80.0–100.0)
Platelets: 276 10*3/uL (ref 150–400)
RBC: 4.14 MIL/uL (ref 3.87–5.11)
RDW: 13.1 % (ref 11.5–15.5)
WBC: 6.5 10*3/uL (ref 4.0–10.5)
nRBC: 0 % (ref 0.0–0.2)

## 2022-04-03 LAB — TROPONIN I (HIGH SENSITIVITY)
Troponin I (High Sensitivity): 7 ng/L (ref <18)
Troponin I (High Sensitivity): 11 ng/L (ref <18)

## 2022-04-03 NOTE — Discharge Instructions (Addendum)
Please follow-up with your cardiologist in the office.

## 2022-04-03 NOTE — ED Provider Notes (Signed)
[facility] Provider Note   CSN: [id number] Arrival date & time: 04/03/22  1816     History  Chief Complaint  Patient presents with   Tachycardia   Chest Pain    Nicole Graves is a 77 y.o. female.  77 yo F with a cc of palpitations.  Started abruptly today.  Has a history of SVT.  Thinks it was the same.  Tried multiple doses of metop at home.  Wasn't working so called EMS.  Given adenosine prior to arrival with improvement.  No symptoms now.  Denies recently illness. Denies cough congestion or fever.  Denies headache or neck pain.    Chest Pain      Home Medications Prior to Admission medications   Medication Sig Start Date End Date Taking? Authorizing Provider  acetaminophen (TYLENOL) 650 MG CR tablet Take 1,300 mg by mouth in the morning and at bedtime.    [provider]  alendronate (FOSAMAX) 70 MG tablet Take 1 tablet by mouth every Thursday.    [provider]  amoxicillin (AMOXIL) 500 MG tablet Take by mouth. TAKE 4 TABLETS BY MOUTH PRIOR TO DENTAL APPOINTMENTS    [provider]  aspirin EC 81 MG tablet Take 81 mg by mouth daily. Swallow whole.    [provider]  atorvastatin (LIPITOR) 10 MG tablet Take 10 mg by mouth at bedtime.  02/11/15   [provider]  buPROPion (WELLBUTRIN XL) 300 MG 24 hr tablet Take 300 mg by mouth daily after breakfast.    [provider]  calcium carbonate (OS-CAL) 600 MG TABS tablet Take 600 mg by mouth 2 (two) times daily with a meal.    [provider]  cholecalciferol (VITAMIN D3) 25 MCG (1000 UNIT) tablet Take 1,000 Units by mouth in the morning and at bedtime.    [provider]  DULoxetine (CYMBALTA) 30 MG capsule Take 90 mg by mouth daily after breakfast.    [provider]  famotidine (PEPCID) 20 MG tablet Take 20 mg by mouth daily.    [provider]  Ferrous Sulfate (IRON) 325 (65 Fe) MG TABS Take 1  tablet by mouth daily at 6 (six) AM.    [provider]  gabapentin (NEURONTIN) 300 MG capsule Take 300 mg by mouth 3 (three) times daily. 12/20/19   [provider]  lidocaine (LIDODERM) 5 % Place 1 patch onto the skin daily. Remove & Discard patch within 12 hours or as directed by MD. Apply to site of pain 09/27/21   Pokhrel, Corrie Mckusick, MD  Melatonin 5 MG CAPS Take 1 capsule by mouth at bedtime.    [provider]  metoprolol succinate (TOPROL-XL) 100 MG 24 hr tablet Take 1 tablet (100 mg total) by mouth daily. Take with or immediately following a meal. 03/18/22   Tommie Raymond, NP  vitamin B-12 (CYANOCOBALAMIN) 1000 MCG tablet Take 1,000 mcg by mouth daily.    [provider]      Allergies    Sulfa antibiotics, Ativan [lorazepam], and Tizanidine hcl    Review of Systems   Review of Systems  Cardiovascular:  Positive for chest pain.    Physical Exam Updated Vital Signs BP (!) 103/57   Pulse 68   Temp 97.7 F (36.5 C) (Temporal)   Resp 10   Ht 5' 4"$  (1.626 m)   Wt 81.6 kg   SpO2 99%   BMI 30.90 kg/m  Physical Exam Vitals  and nursing note reviewed.  Constitutional:      General: She is not in acute distress.    Appearance: She is well-developed. She is not diaphoretic.  HENT:     Head: Normocephalic and atraumatic.  Eyes:     Pupils: Pupils are equal, round, and reactive to light.  Cardiovascular:     Rate and Rhythm: Normal rate and regular rhythm.     Heart sounds: No murmur heard.    No friction rub. No gallop.  Pulmonary:     Effort: Pulmonary effort is normal.     Breath sounds: No wheezing or rales.  Abdominal:     General: There is no distension.     Palpations: Abdomen is soft.     Tenderness: There is no abdominal tenderness.  Musculoskeletal:        General: No tenderness.     Cervical back: Normal range of motion and neck supple.  Skin:    General: Skin is warm and dry.  Neurological:     Mental Status: She is alert and  oriented to person, place, and time.  Psychiatric:        Behavior: Behavior normal.     ED Results / Procedures / Treatments   Labs (all labs ordered are listed, but only abnormal results are displayed) Labs Reviewed  BASIC METABOLIC PANEL - Abnormal; Notable for the following components:      Result Value   Glucose, Bld 112 (*)    BUN 37 (*)    Creatinine, Ser 1.45 (*)    GFR, Estimated 37 (*)    All other components within normal limits  CBC  TROPONIN I (HIGH SENSITIVITY)  TROPONIN I (HIGH SENSITIVITY)    EKG EKG Interpretation  Date/Time:  Sunday April 03 2022 18:38:06 EST Ventricular Rate:  93 PR Interval:  162 QRS Duration: 78 QT Interval:  344 QTC Calculation: 427 R Axis:   32 Text Interpretation: Normal sinus rhythm Low voltage QRS Cannot rule out Anterior infarct , age undetermined Abnormal ECG No significant change since last tracing Confirmed by Deno Etienne [phone]) on 04/03/2022 8:54:17 PM  Radiology DG Chest 2 View  Result Date: 04/03/2022 CLINICAL DATA:  chest pain EXAM: CHEST - 2 VIEW COMPARISON:  Chest x-ray 10/03/2022, CT chest 03/02/2021 FINDINGS: The heart and mediastinal contours are within normal limits. Large rounded density posterior to the cardiac silhouette consistent with a large hiatal hernia. Status post aortic valve replacement. No focal consolidation. No pulmonary edema. No pleural effusion. No pneumothorax. No acute osseous abnormality. Severe degenerative changes of the left shoulder. IMPRESSION: 1. No active cardiopulmonary disease. 2. Large hiatal hernia. 3. Aortic Atherosclerosis (ICD10-I70.0). Electronically Signed   By: Iven Finn M.D.   On: 04/03/2022 19:02    Procedures Procedures    Medications Ordered in ED Medications - No data to display  ED Course/ Medical Decision Making/ A&P                             Medical Decision Making Amount and/or Complexity of Data Reviewed Labs: ordered. Radiology: ordered.   77 yo F  with a cc of palpitations.  This started abruptly.  She has a history of SVT and thinks it was the same.  Was picked up by EMS and was given adenosine and then had resolution.  She now feels completely asymptomatic.  She had initial blood work that was without significant electrolyte abnormality.  Her creatinine  is mildly bumped from baseline.  She tells me that she saw her nephrologist this week and plans to have that followed up.  Her initial troponin was negative.  Her second 1 is already running.  Will await its resolution.  Second troponin is also negative.  Will discharge the patient home.  PCP follow-up.  10:39 PM:  I have discussed the diagnosis/risks/treatment options with the patient.  Evaluation and diagnostic testing in the emergency department does not suggest an emergent condition requiring admission or immediate intervention beyond what has been performed at this time.  They will follow up with PCP. We also discussed returning to the ED immediately if new or worsening sx occur. We discussed the sx which are most concerning (e.g., sudden worsening pain, fever, inability to tolerate by mouth) that necessitate immediate return. Medications administered to the patient during their visit and any new prescriptions provided to the patient are listed below.  Medications given during this visit Medications - No data to display   The patient appears reasonably screen and/or stabilized for discharge and I doubt any other medical condition or other [facility] requiring further screening, evaluation, or treatment in the ED at this time prior to discharge.          Final Clinical Impression(s) / ED Diagnoses Final diagnoses:  SVT (supraventricular tachycardia)    Rx / DC Orders ED Discharge Orders          Ordered    Ambulatory referral to Cardiology       Comments: If you have not heard from the Cardiology office within the next 72 hours please call [phone].   04/03/22 2144               Deno Etienne, DO 04/03/22 2239

## 2022-04-03 NOTE — ED Notes (Signed)
Pt verbalized understanding of d/c instructions, meds, and followup care. Denies questions. VSS, no distress noted. Steady gait to exit with all belongings.Pt verbalized understanding of d/c instructions, meds, and followup care. Denies questions. VSS, no distress noted. Steady gait to exit with all belongings.

## 2022-04-03 NOTE — ED Triage Notes (Signed)
Aox4. Ambulatory on arrival-POV.  Reports HR of 160 at home. Took two doses of metoprolol per PRN cardiologist orders- @1500$ . Reports HR reached 140 after this. Reports some CP and tightness with this event. Improved before arriving.

## 2022-04-04 ENCOUNTER — Telehealth: Payer: Self-pay | Admitting: Cardiology

## 2022-04-04 DIAGNOSIS — I471 Supraventricular tachycardia, unspecified: Secondary | ICD-10-CM

## 2022-04-04 NOTE — Telephone Encounter (Signed)
Patient was admitted in the hospital last night with  SVT. She is back home today and states she does not need a follow up appointment due to recently being seen, but she was advised to let Dr. Heron Nay know about this incident. Notes are on file.

## 2022-04-05 NOTE — Telephone Encounter (Signed)
Spoke to patient regarding ED visit yesterday 04/03/22. Patient states that her home cardiac monitor alerted her that her heart rate was fast yesterday and the only symptom she noted was chest tightness. She took extra doses of metoprolol and when she had no result, she drove herself to the ED. She states she did not call EMS as the note from the ED states and she was not given adenosine this stay. She states by the time she got to the ED her heart rate was improving and she was not treated with any additional medication. She states she has not had any episodes of SVT since her previous ED visit on 02/22/22. She denies any current chest pain or SOB.

## 2022-04-05 NOTE — Addendum Note (Signed)
Addended by: Eather Colas L on: 04/05/2022 01:00 PM   Modules accepted: Orders

## 2022-04-05 NOTE — Addendum Note (Signed)
Addended by: Joni Reining on: 04/05/2022 12:55 PM   Modules accepted: Orders

## 2022-04-05 NOTE — Telephone Encounter (Signed)
Called patient back to discuss Dr. Theodosia Blender recommendation that she be referred to EP w/ Dr. Lovena Le. Patient agrees to plan. Referral placed.

## 2022-04-06 NOTE — Progress Notes (Signed)
Cardiology Office Note:    Date:  04/11/2022   ID:  Nicole, Graves 1946/02/03, MRN [id number]  PCP:  Kathyrn Lass, [facility] Cardiologist:  Fransico Him, MD     Referring MD: Kathyrn Lass, MD   Chief Complaint:  Palpitations     History of Present Illness:   Nicole Graves is a 77 y.o. 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 She was discharged on aspirin monotherapy. Of note, Pre TAVR [facility] 02/17/21 showed no CAD with right dominance.    She was last seen in our office 03/18/22 for several episodes of SVT and metoprolol increased and was doing better but mild orthostatic dizziness. Back in ED 04/03/22 with recurrent SVT. She had taken an extra metoprolol and converted by the time she got to ED. Dr. Radford [name] referred her to Dr. Lovena Le for ablation. No further palpitations.     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 Ali Lowe and Dr. Cyndia Bent   Severe aortic stenosis    Stage 3b chronic kidney disease (CKD) (HCC)    SVT (supraventricular tachycardia)    TR (tricuspid regurgitation) 09/19/2016   mild by echo 09/2021   Current Medications: Current Meds  Medication Sig   acetaminophen (TYLENOL) 650 MG CR  tablet Take 1,300 mg by mouth in the morning and at bedtime.   alendronate (FOSAMAX) 70 MG tablet Take 1 tablet by mouth every Thursday.   amoxicillin (AMOXIL) 500 MG tablet Take by mouth. TAKE 4 TABLETS BY MOUTH PRIOR TO DENTAL APPOINTMENTS   aspirin EC 81 MG tablet Take 81 mg by mouth daily. Swallow whole.   atorvastatin (LIPITOR) 10 MG tablet Take 10 mg by mouth at bedtime.    buPROPion (WELLBUTRIN XL) 300 MG 24 hr tablet Take 300 mg by mouth daily after breakfast.   calcium carbonate (OS-CAL) 600 MG TABS tablet Take 600 mg by mouth 2 (two) times daily with a meal.   cholecalciferol (VITAMIN D3) 25 MCG (1000 UNIT) tablet Take 1,000 Units by mouth in the morning and at bedtime.   cyclobenzaprine (FLEXERIL) 10 MG tablet Take 10 mg by mouth daily.   DULoxetine (CYMBALTA) 30 MG capsule Take 90 mg by mouth daily after breakfast.   famotidine (PEPCID) 20 MG tablet Take 20 mg by mouth daily.   Ferrous Sulfate (IRON) 325 (65 Fe) MG TABS Take 1 tablet by mouth daily at 6 (six) AM.   gabapentin (NEURONTIN) 300 MG capsule Take 300 mg by mouth 3 (three) times daily.   lidocaine (LIDODERM) 5 % Place 1 patch onto the skin daily. Remove & Discard patch within  12 hours or as directed by MD. Apply to site of pain   Melatonin 5 MG CAPS Take 1 capsule by mouth at bedtime.   vitamin B-12 (CYANOCOBALAMIN) 1000 MCG tablet Take 1,000 mcg by mouth daily.   [DISCONTINUED] metoprolol succinate (TOPROL-XL) 100 MG 24 hr tablet Take 1 tablet (100 mg total) by mouth daily. Take with or immediately following a meal. (Patient taking differently: Take 50 mg by mouth daily. Take with or immediately following a meal.)    Allergies:   Sulfa antibiotics, Ativan [lorazepam], and Tizanidine hcl   Social History   Tobacco Use   Smoking status: Never   Smokeless tobacco: Never  Vaping Use   Vaping Use: Never used  Substance Use Topics   Alcohol use: No   Drug use: No    Family Hx: The patient's family history includes  Colon cancer in her mother; Heart attack in her brother; Hypertension in her brother. There is no history of Stroke.  ROS     Physical Exam:    VS:  BP 118/68   Pulse 73   Ht 5' 2"$  (1.575 m)   Wt 182 lb 9.6 oz (82.8 kg)   SpO2 98%   BMI 33.40 kg/m     Wt Readings from Last 3 Encounters:  04/11/22 182 lb 9.6 oz (82.8 kg)  04/03/22 180 lb (81.6 kg)  03/18/22 178 lb (80.7 kg)    Physical Exam  GEN: Well nourished, well developed, in no acute distress  Neck: no JVD, carotid bruits, or masses Cardiac:RRR; no murmurs, rubs, or gallops  Respiratory:  clear to auscultation bilaterally, normal work of breathing GI: soft, nontender, nondistended, + BS Ext: without cyanosis, clubbing, or edema, Good distal pulses bilaterally Neuro:  Alert and Oriented x 3,  Psych: euthymic mood, full affect        EKGs/Labs/Other Test Reviewed:    EKG:  EKG is  not ordered today.     Recent Labs: 09/23/2021: ALT 8 09/24/2021: TSH 1.604 12/07/2021: Magnesium 1.9 04/03/2022: BUN 37; Creatinine, Ser 1.45; Hemoglobin 12.6; Platelets 276; Potassium 3.9; Sodium 142   Recent Lipid Panel Recent Labs    09/23/21 0455  CHOL 136  TRIG 64  HDL 59  VLDL 13  LDLCALC 64     Prior CV Studies:     Echocardiogram 03/18/22:   1. S/P TAVR with mean gradient 6 mmHg and DI 0.65; mild AI; no  significant change compared to 04/16/21.   2. Left ventricular ejection fraction, by estimation, is 60 to 65%. The  left ventricle has normal function. The left ventricle has no regional  wall motion abnormalities. There is mild left ventricular hypertrophy.  Left ventricular diastolic parameters  are consistent with Grade I diastolic dysfunction (impaired relaxation).  Elevated left atrial pressure.   3. Right ventricular systolic function is normal. The right ventricular  size is normal.   4. The mitral valve is normal in structure. Mild mitral valve  regurgitation. No evidence of mitral stenosis.   5. Tricuspid  valve regurgitation is mild to moderate.   6. The aortic valve has been repaired/replaced. Aortic valve  regurgitation is mild. No aortic stenosis is present. There is a 29 mm  CoreValve-Evolut Pro prosthetic (TAVR) valve present in the aortic  position. Procedure Date: 03/16/21.   7. The inferior vena cava is normal in size with greater than 50%  respiratory variability, suggesting right atrial pressure of 3 mmHg.      TAVR OPERATIVE NOTE  Date of Procedure:                03/16/2021    Preoperative Diagnosis:      Severe Aortic Stenosis    Postoperative Diagnosis:    Same    Procedure:        Transcatheter Aortic Valve Replacement - Percutaneous Transfemoral Approach             Medtronic Evolut Pro-Plus (size 29 mm, serial # [id number])              Co-Surgeons:                        Gaye Pollack, MD and Lenna Sciara MD   Anesthesiologist:                  Adele Barthel, MD   Echocardiographer:              Jenkins Rouge, MD   Pre-operative Echo Findings:  Severe aortic stenosis  Left ventricular systolic function   Post-operative Echo Findings: Trace paravalvular leak Normal left ventricular systolic function   ____________     Echo 03/17/21:   1. Left ventricular ejection fraction, by estimation, is 55 to 60%. The  left ventricle has normal function. The left ventricle has no regional  wall motion abnormalities. Left ventricular diastolic parameters are  indeterminate.   2. Right ventricular systolic function is normal. The right ventricular  size is normal. There is mildly elevated pulmonary artery systolic  pressure.   3. Left atrial size was moderately dilated.   4. The mitral valve is degenerative. Mild mitral valve regurgitation. No  evidence of mitral stenosis. Moderate mitral annular calcification.   5. Well seated bioprosthetic valve. The aortic valve has been  repaired/replaced. Aortic valve regurgitation Mild aortic regurgitation  with trivial  paravalvular leak at the 1oclock position. No aortic stenosis  is present. There is a CoreValve-Evolut Pro  prosthetic (TAVR) valve present in the aortic position. Procedure Date:  03/16/2021. Aortic valve mean gradient measures 9.0 mmHg. Aortic valve Vmax  measures 2.09 m/s.   6. The inferior vena cava is normal in size with greater than 50%  respiratory variability, suggesting right atrial pressure of 3 mmHg.    Risk Assessment/Calculations/Metrics:              ASSESSMENT & PLAN:   No problem-specific Assessment & Plan notes found for this encounter.   Severe AS s/p TAVR: echo today shows EF at 60-65%, normally functioning TAVR with a mean gradient of 35m hg and no PVL. She has NYHA class I symptoms. Continue aspirin 859mdaily. SBE prophylaxis discussed; I have RX'd amoxicillin. Continue regular follow up with Dr. TuRadford [name]    SVT: recurrence and referred to Dr. TaLovena Leor possible ablation. Taking extra toprol as needed. Will try to move appt up.   CKD stage IIIa: creat last checked 04/03/22 ~1.45              Dispo:  Return in about 1 month (around 05/10/2022) for Follow up in 1 month with Dr. TaLovena Le  Medication Adjustments/Labs and Tests Ordered: Current medicines are reviewed at length with the patient today.  Concerns regarding medicines are outlined above.  Tests Ordered: No orders of the defined types were placed in this encounter.  Medication Changes: No orders of the defined types were placed in this encounter.  SiSumner BoastPA-C  04/11/2022 10:22 AM  [facility] [place], [place], [place]  [address] Phone: [phone]; Fax: [phone]

## 2022-04-07 ENCOUNTER — Ambulatory Visit: Payer: Medicare PPO | Admitting: Adult Health

## 2022-04-10 ENCOUNTER — Other Ambulatory Visit: Payer: Self-pay | Admitting: Cardiology

## 2022-04-11 ENCOUNTER — Encounter: Payer: Self-pay | Admitting: Physician Assistant

## 2022-04-11 ENCOUNTER — Ambulatory Visit: Payer: Medicare PPO | Attending: Physician Assistant | Admitting: Physician Assistant

## 2022-04-11 ENCOUNTER — Other Ambulatory Visit: Payer: Self-pay | Admitting: *Deleted

## 2022-04-11 VITALS — BP 118/68 | HR 73 | Ht 62.0 in | Wt 182.6 lb

## 2022-04-11 DIAGNOSIS — Z952 Presence of prosthetic heart valve: Secondary | ICD-10-CM | POA: Diagnosis not present

## 2022-04-11 DIAGNOSIS — N1832 Chronic kidney disease, stage 3b: Secondary | ICD-10-CM

## 2022-04-11 DIAGNOSIS — I471 Supraventricular tachycardia, unspecified: Secondary | ICD-10-CM

## 2022-04-11 MED ORDER — METOPROLOL SUCCINATE ER 100 MG PO TB24: 100.0000 mg | ORAL_TABLET | Freq: Every day | ORAL | 3 refills | Status: DC

## 2022-04-11 NOTE — Patient Instructions (Signed)
Medication Instructions:   Your physician recommends that you continue on your current medications as directed. Please refer to the Current Medication list given to you today.   *If you need a refill on your cardiac medications before your next appointment, please call your pharmacy*   Lab Work:  None ordered.  If you have labs (blood work) drawn today and your tests are completely normal, you will receive your results only by: [place] (if you have MyChart) OR A paper copy in the mail If you have any lab test that is abnormal or we need to change your treatment, we will call you to review the results.   Testing/Procedures:  None ordered.   Follow-Up: At [facility], you and your health needs are our priority.  As part of our continuing mission to provide you with exceptional heart care, we have created designated Provider Care Teams.  These Care Teams include your primary Cardiologist (physician) and Advanced Practice Providers (APPs -  Physician Assistants and Nurse Practitioners) who all work together to provide you with the care you need, when you need it.  We recommend signing up for the patient portal called "MyChart".  Sign up information is provided on this After Visit Summary.  MyChart is used to connect with patients for Virtual Visits (Telemedicine).  Patients are able to view lab/test results, encounter notes, upcoming appointments, etc.  Non-urgent messages can be sent to your provider as well.   To learn more about what you can do with MyChart, go to [email or web address].    Your next appointment:   1 month(s)  Provider:   Cristopher Peru, MD

## 2022-04-14 ENCOUNTER — Ambulatory Visit: Payer: Medicare PPO | Admitting: Adult Health

## 2022-04-14 ENCOUNTER — Encounter: Payer: Self-pay | Admitting: Adult Health

## 2022-04-14 VITALS — BP 120/80 | HR 64 | Temp 96.6°F | Resp 18 | Ht 64.0 in | Wt 183.0 lb

## 2022-04-14 DIAGNOSIS — D638 Anemia in other chronic diseases classified elsewhere: Secondary | ICD-10-CM

## 2022-04-14 DIAGNOSIS — M5441 Lumbago with sciatica, right side: Secondary | ICD-10-CM | POA: Diagnosis not present

## 2022-04-14 DIAGNOSIS — E1169 Type 2 diabetes mellitus with other specified complication: Secondary | ICD-10-CM

## 2022-04-14 DIAGNOSIS — F329 Major depressive disorder, single episode, unspecified: Secondary | ICD-10-CM | POA: Diagnosis not present

## 2022-04-14 DIAGNOSIS — N1832 Chronic kidney disease, stage 3b: Secondary | ICD-10-CM | POA: Diagnosis not present

## 2022-04-14 DIAGNOSIS — G8929 Other chronic pain: Secondary | ICD-10-CM | POA: Diagnosis not present

## 2022-04-14 DIAGNOSIS — I4719 Other supraventricular tachycardia: Secondary | ICD-10-CM | POA: Diagnosis not present

## 2022-04-14 DIAGNOSIS — E782 Mixed hyperlipidemia: Secondary | ICD-10-CM

## 2022-04-14 MED ORDER — CYCLOBENZAPRINE HCL 10 MG PO TABS: 10.0000 mg | ORAL_TABLET | Freq: Every day | ORAL | 0 refills | Status: AC

## 2022-04-14 NOTE — Progress Notes (Signed)
[facility] clinic  Provider: Durenda Age DNP  Code Status:  Full Code  Goals of Care:     04/03/2022    6:32 PM  Advanced Directives  Does Patient Have a Medical Advance Directive? No     Chief Complaint  Patient presents with   Follow-up    Six Month Follow-up    HPI: Patient is a 77 y.o. female seen today for medical management of chronic issues.  Paroxysmal atrial tachycardia -  been in the ED 02/22/22 and 03/25/22 for tachycardia with chest pains, incidents occur while at rest, takes Toprol XL   Mixed hyperlipidemia due to type 2 diabetes mellitus ([place]) -  takes  Atorvastatin   Anemia of chronic disease -  takes FeSO4, hgb 12.6 (04/03/22  Major depressive disorder, remission status unspecified, unspecified whether recurrent -  denies depression, takes Wellbutrin  Chronic bilateral low back pain with right-sided sciatica - re-started taking Flexeril at night to help with pain and sleep - cyclobenzaprine (FLEXERIL) 10 MG tablet; Take 1 tablet (10 mg total) by mouth at bedtime.  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 Ali Lowe and Dr. Cyndia Bent   Severe aortic stenosis    Stage 3b chronic kidney disease (CKD) ([place])    SVT (supraventricular tachycardia)    TR (tricuspid regurgitation)  09/19/2016   mild by echo 09/2021    Past Surgical History:  Procedure Laterality Date   CESAREAN SECTION  1964   [place]   removal extra fat inner thighs   EYE SURGERY Bilateral     Cataract extraction with IOL   INTRAOPERATIVE TRANSTHORACIC ECHOCARDIOGRAM N/A 03/16/2021   Procedure: INTRAOPERATIVE TRANSTHORACIC ECHOCARDIOGRAM;  Surgeon: Early Osmond, MD;  Location: [facility];  Service: Open Heart Surgery;  Laterality: N/A;   RIGHT HEART CATH AND CORONARY ANGIOGRAPHY N/A 02/17/2021   Procedure: RIGHT HEART CATH AND CORONARY ANGIOGRAPHY;  Surgeon: Early Osmond, MD;  Location: [facility];  Service: Cardiovascular;  Laterality: N/A;   TOTAL KNEE ARTHROPLASTY Right 11/30/2015   Procedure: RIGHT TOTAL KNEE ARTHROPLASTY;  Surgeon: Gaynelle Arabian, MD;  Location: [facility];  Service: Orthopedics;  Laterality: Right;   TOTAL KNEE ARTHROPLASTY Left 11/14/2016   Procedure: LEFT TOTAL KNEE ARTHROPLASTY;  Surgeon: Gaynelle Arabian, MD;  Location: [facility];  Service: Orthopedics;  Laterality: Left;   TRANSCATHETER AORTIC VALVE REPLACEMENT, TRANSFEMORAL Right 03/16/2021   Procedure: TRANSCATHETER AORTIC VALVE REPLACEMENT, TRANSFEMORAL;  Surgeon: Early Osmond, MD;  Location: [facility];  Service: Open Heart Surgery;  Laterality: Right;    Allergies  Allergen Reactions  Sulfa Antibiotics Nausea And Vomiting and Other (See Comments)   Ativan [Lorazepam] Other (See Comments)    Hyperactive    Tizanidine Hcl Other (See Comments)    Pt does not remember     Outpatient Encounter Medications as of 04/14/2022  Medication Sig   acetaminophen (TYLENOL) 650 MG CR tablet Take 1,300 mg by mouth in the morning and at bedtime.   alendronate (FOSAMAX) 70 MG tablet Take 1 tablet by mouth every Thursday.   amoxicillin (AMOXIL) 500 MG tablet Take by mouth. TAKE 4 TABLETS BY MOUTH PRIOR TO DENTAL APPOINTMENTS   aspirin EC 81 MG tablet Take 81 mg by mouth daily. Swallow whole.    atorvastatin (LIPITOR) 10 MG tablet Take 10 mg by mouth at bedtime.    buPROPion (WELLBUTRIN XL) 300 MG 24 hr tablet Take 300 mg by mouth daily after breakfast.   calcium carbonate (OS-CAL) 600 MG TABS tablet Take 600 mg by mouth 2 (two) times daily with a meal.   cholecalciferol (VITAMIN D3) 25 MCG (1000 UNIT) tablet Take 1,000 Units by mouth in the morning and at bedtime.   cyclobenzaprine (FLEXERIL) 10 MG tablet Take 10 mg by mouth daily.   DULoxetine (CYMBALTA) 30 MG capsule Take 90 mg by mouth daily after breakfast.   famotidine (PEPCID) 20 MG tablet Take 20 mg by mouth daily.   Ferrous Sulfate (IRON) 325 (65 Fe) MG TABS Take 1 tablet by mouth daily at 6 (six) AM.   gabapentin (NEURONTIN) 300 MG capsule Take 300 mg by mouth 3 (three) times daily.   lidocaine (LIDODERM) 5 % Place 1 patch onto the skin daily. Remove & Discard patch within 12 hours or as directed by MD. Apply to site of pain   Melatonin 5 MG CAPS Take 1 capsule by mouth at bedtime.   metoprolol succinate (TOPROL-XL) 50 MG 24 hr tablet Take 50 mg by mouth daily. Take with or immediately following a meal.   vitamin B-12 (CYANOCOBALAMIN) 1000 MCG tablet Take 1,000 mcg by mouth daily.   [DISCONTINUED] metoprolol succinate (TOPROL-XL) 100 MG 24 hr tablet Take 1 tablet (100 mg total) by mouth daily. Take with or immediately following a meal.   No facility-administered encounter medications on file as of 04/14/2022.    Review of Systems:  Review of Systems  Constitutional:  Negative for appetite change, chills, fatigue and fever.  HENT:  Negative for congestion, hearing loss, rhinorrhea and sore throat.   Eyes: Negative.   Respiratory:  Negative for cough, shortness of breath and wheezing.   Cardiovascular:  Negative for chest pain, palpitations and leg swelling.  Gastrointestinal:  Negative for abdominal pain, constipation, diarrhea, nausea and vomiting.  Genitourinary:  Negative for dysuria.  Musculoskeletal:  Negative for  arthralgias, back pain and myalgias.  Skin:  Negative for color change, rash and wound.  Neurological:  Negative for dizziness, weakness and headaches.  Psychiatric/Behavioral:  Negative for behavioral problems. The patient is not nervous/anxious.     Health Maintenance  Topic Date Due   Medicare Annual Wellness (AWV)  Never done   OPHTHALMOLOGY EXAM  Never done   Diabetic kidney evaluation - Urine ACR  Never done   Hepatitis C Screening  Never done   DEXA SCAN  Never done   Zoster Vaccines- Shingrix (2 of 2) 12/22/2018   COVID-19 Vaccine (4 - 2023-24 season) 10/15/2021   HEMOGLOBIN A1C  03/26/2022   FOOT EXAM  04/15/2022 (Originally 03/29/1955)   DTaP/Tdap/Td (4 - Td or Tdap) 02/06/2023  Diabetic kidney evaluation - eGFR measurement  04/04/2023   Pneumonia Vaccine 56+ Years old  Completed   INFLUENZA VACCINE  Completed   HPV VACCINES  Aged Out    Physical Exam: Vitals:   04/14/22 1012  BP: 120/80  Pulse: 64  Resp: 18  Temp: (!) 96.6 F (35.9 C)  SpO2: 96%  Weight: 183 lb (83 kg)  Height: '5\' 4"'$  (1.626 m)   Body mass index is 31.41 kg/m. Physical Exam Constitutional:      General: She is not in acute distress.    Appearance: She is obese.  HENT:     Head: Normocephalic and atraumatic.     Nose: Nose normal.     Mouth/Throat:     Mouth: Mucous membranes are moist.  Eyes:     Conjunctiva/sclera: Conjunctivae normal.  Cardiovascular:     Rate and Rhythm: Normal rate and regular rhythm.  Pulmonary:     Effort: Pulmonary effort is normal.     Breath sounds: Normal breath sounds.  Abdominal:     General: Bowel sounds are normal.     Palpations: Abdomen is soft.  Musculoskeletal:        General: Normal range of motion.     Cervical back: Normal range of motion.  Skin:    General: Skin is warm and dry.  Neurological:     General: No focal deficit present.     Mental Status: She is alert and oriented to person, place, and time.  Psychiatric:        Mood and  Affect: Mood normal.        Behavior: Behavior normal.        Thought Content: Thought content normal.        Judgment: Judgment normal.     Labs reviewed: Basic Metabolic Panel: Recent Labs    09/23/21 0455 09/23/21 0854 09/24/21 0242 09/27/21 0654 12/07/21 1251 02/22/22 1649 04/03/22 1834  NA 139  --  138 138 137 140 142  K 3.4*  --  3.8 4.4 4.8 3.6 3.9  CL 108  --  107 111 103 113* 104  CO2 25  --  24 22 21* 21* 27  GLUCOSE 90  --  102* 91 111* 85 112*  BUN 19  --  13 17 33* 22 37*  CREATININE 1.18*  --  1.15* 1.16* 1.59* 1.06* 1.45*  CALCIUM 9.1  --  9.1 8.2* 9.9 7.8* 10.0  MG 1.3*  --  1.7 1.9 1.9  --   --   PHOS  --   --  2.9  --   --   --   --   TSH 2.338 2.183 1.604  --   --   --   --    Liver Function Tests: Recent Labs    09/22/21 1509 09/23/21 0455  AST 14* 12*  ALT 9 8  ALKPHOS 48 40  BILITOT 0.5 0.5  PROT 6.4* 5.3*  ALBUMIN 3.6 2.9*   No results for input(s): "LIPASE", "AMYLASE" in the last 8760 hours. Recent Labs    09/23/21 0854  AMMONIA 14   CBC: Recent Labs    09/22/21 1509 09/23/21 0455 09/27/21 0654 12/07/21 1251 02/22/22 1649 04/03/22 1834  WBC 5.9 5.3   < > 12.7* 8.1 6.5  NEUTROABS 3.4 2.6  --  7.1  --   --   HGB 10.9* 9.7*   < > 14.3 12.2 12.6  HCT 33.9* 29.8*   < > 45.3 38.1 38.9  MCV 96.0 94.3   < > 96.8 95.7 94.0  PLT 238 203   < > 393 299 276   < > = values in this interval not displayed.   Lipid Panel: Recent Labs    09/23/21 0455  CHOL 136  HDL 59  LDLCALC 64  TRIG 64  CHOLHDL 2.3   Lab Results  Component Value Date   HGBA1C 4.9 09/23/2021    Procedures since last visit: DG Chest 2 View  Result Date: 04/03/2022 CLINICAL DATA:  chest pain EXAM: CHEST - 2 VIEW COMPARISON:  Chest x-ray 10/03/2022, CT chest 03/02/2021 FINDINGS: The heart and mediastinal contours are within normal limits. Large rounded density posterior to the cardiac silhouette consistent with a large hiatal hernia. Status post aortic valve  replacement. No focal consolidation. No pulmonary edema. No pleural effusion. No pneumothorax. No acute osseous abnormality. Severe degenerative changes of the left shoulder. IMPRESSION: 1. No active cardiopulmonary disease. 2. Large hiatal hernia. 3. Aortic Atherosclerosis (ICD10-I70.0). Electronically Signed   By: Iven Finn M.D.   On: 04/03/2022 19:02   ECHOCARDIOGRAM COMPLETE  Result Date: 03/18/2022    ECHOCARDIOGRAM REPORT   Patient Name:   NIKKOL CONKEL  Date of Exam: 03/18/2022 Medical Rec #:  [id number]     Height:       64.0 in Accession #:    [id number]    Weight:       178.0 lb Date of Birth:  07-Aug-1945     BSA:          1.862 m Patient Age:    35 years      BP:           110/70 mmHg Patient Gender: F             HR:           75 bpm. Exam Location:  [address] Procedure: 2D Echo, Cardiac Doppler and Color Doppler Indications:    Z95.2 Post TAVR evaluation (31m Medtronic Evolut FX)  History:        Patient has prior history of Echocardiogram examinations, most                 recent 09/24/2021. Risk Factors:Diabetes. Paroxysmal atrial                 tachycardia. Anemia. SVT.                 Aortic Valve: 29 mm CoreValve-Evolut Pro prosthetic (TAVR) valve                 is present in the aortic position. Procedure Date: 03/16/21.  Sonographer:    ADiamond NickelRCS Referring Phys: JAlphonsa OverallMKaser 1. S/P TAVR with mean gradient 6 mmHg and DI 0.65; mild AI; no significant change compared to 04/16/21.  2. Left ventricular ejection fraction, by estimation, is 60 to 65%. The left ventricle has normal function. The left ventricle has no regional wall motion abnormalities. There is mild left ventricular hypertrophy. Left ventricular diastolic parameters are consistent with Grade I diastolic dysfunction (impaired relaxation). Elevated left atrial pressure.  3. Right ventricular systolic function is normal. The right ventricular size is normal.  4. The mitral valve is normal in structure. Mild  mitral valve regurgitation. No evidence of mitral stenosis.  5. Tricuspid valve regurgitation is mild to moderate.  6. The aortic valve has been repaired/replaced. Aortic valve regurgitation is mild. No aortic stenosis is present. There is  a 29 mm CoreValve-Evolut Pro prosthetic (TAVR) valve present in the aortic position. Procedure Date: 03/16/21.  7. The inferior vena cava is normal in size with greater than 50% respiratory variability, suggesting right atrial pressure of 3 mmHg. FINDINGS  Left Ventricle: Left ventricular ejection fraction, by estimation, is 60 to 65%. The left ventricle has normal function. The left ventricle has no regional wall motion abnormalities. The left ventricular internal cavity size was normal in size. There is  mild left ventricular hypertrophy. Left ventricular diastolic parameters are consistent with Grade I diastolic dysfunction (impaired relaxation). Elevated left atrial pressure. Right Ventricle: The right ventricular size is normal. Right ventricular systolic function is normal. Left Atrium: Left atrial size was normal in size. Right Atrium: Right atrial size was normal in size. Pericardium: There is no evidence of pericardial effusion. Mitral Valve: The mitral valve is normal in structure. Mild mitral annular calcification. Mild mitral valve regurgitation. No evidence of mitral valve stenosis. Tricuspid Valve: The tricuspid valve is normal in structure. Tricuspid valve regurgitation is mild to moderate. No evidence of tricuspid stenosis. Aortic Valve: The aortic valve has been repaired/replaced. Aortic valve regurgitation is mild. No aortic stenosis is present. Aortic valve mean gradient measures 6.0 mmHg. Aortic valve peak gradient measures 12.0 mmHg. Aortic valve area, by VTI measures 4.28 cm. There is a 29 mm CoreValve-Evolut Pro prosthetic (TAVR) valve present in the aortic position. Procedure Date: 03/16/21. Pulmonic Valve: The pulmonic valve was normal in structure.  Pulmonic valve regurgitation is mild. No evidence of pulmonic stenosis. Aorta: The aortic root is normal in size and structure. Venous: The inferior vena cava is normal in size with greater than 50% respiratory variability, suggesting right atrial pressure of 3 mmHg. IAS/Shunts: No atrial level shunt detected by color flow Doppler. Additional Comments: S/P TAVR with mean gradient 6 mmHg and DI 0.65; mild AI; no significant change compared to 04/16/21.  LEFT VENTRICLE PLAX 2D LVIDd:         3.80 cm   Diastology LVIDs:         2.10 cm   LV e' medial:    6.64 cm/s LV PW:         1.20 cm   LV E/e' medial:  16.0 LV IVS:        1.20 cm   LV e' lateral:   9.25 cm/s LVOT diam:     2.90 cm   LV E/e' lateral: 11.5 LV SV:         155 LV SV Index:   83 LVOT Area:     6.61 cm  RIGHT VENTRICLE RV Basal diam:  3.80 cm RV S prime:     12.00 cm/s TAPSE (M-mode): 1.8 cm RVSP:           37.1 mmHg LEFT ATRIUM             Index        RIGHT ATRIUM           Index LA diam:        3.80 cm 2.04 cm/m   RA Pressure: 3.00 mmHg LA Vol (A2C):   35.6 ml 19.12 ml/m  RA Area:     14.10 cm LA Vol (A4C):   53.3 ml 28.63 ml/m  RA Volume:   33.60 ml  18.05 ml/m LA Biplane Vol: 45.7 ml 24.55 ml/m  AORTIC VALVE  PULMONIC VALVE AV Area (Vmax):    4.05 cm      PR End Diast Vel: 5.82 msec AV Area (Vmean):   4.06 cm AV Area (VTI):     4.28 cm AV Vmax:           173.00 cm/s AV Vmean:          110.000 cm/s AV VTI:            0.363 m AV Peak Grad:      12.0 mmHg AV Mean Grad:      6.0 mmHg LVOT Vmax:         106.00 cm/s LVOT Vmean:        67.600 cm/s LVOT VTI:          0.235 m LVOT/AV VTI ratio: 0.65  AORTA Ao Asc diam: 2.90 cm MITRAL VALVE                TRICUSPID VALVE MV Area (PHT): 3.87 cm     TR Peak grad:   34.1 mmHg MV Decel Time: 196 msec     TR Vmax:        292.00 cm/s MR Peak grad: 116.6 mmHg    Estimated RAP:  3.00 mmHg MR Mean grad: 77.0 mmHg     RVSP:           37.1 mmHg MR Vmax:      540.00 cm/s MR Vmean:     418.0  cm/s    SHUNTS MV E velocity: 106.00 cm/s  Systemic VTI:  0.24 m MV A velocity: 113.00 cm/s  Systemic Diam: 2.90 cm MV E/A ratio:  0.94 Kirk Ruths MD Electronically signed by Kirk Ruths MD Signature Date/Time: 03/18/2022/12:38:28 PM    Final     Assessment/Plan  1. Paroxysmal atrial tachycardia -  HR 64 -  recently went to ED for tachycardia with chest pains twice -  continue Toprol XL -  follows up with cardiology on 05/12/22  2. 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 Chronic kidney disease, stage 3b [facility]) Lab Results  Component Value Date   NA 142 04/03/2022   K 3.9 04/03/2022   CO2 27 04/03/2022   GLUCOSE 112 (H) 04/03/2022   BUN 37 (H) 04/03/2022   CREATININE 1.45 (H) 04/03/2022   CALCIUM 10.0 04/03/2022   EGFR 49 (L) 03/24/2021   GFRNONAA 37 (L) 04/03/2022   -  stable  4. Anemia of chronic disease Lab Results  Component Value Date   HGB 12.6 04/03/2022   -  discontinue FeSO4  5. Major depressive disorder, remission status unspecified, unspecified whether recurrent -  mood is stable, continue Wellbutrin  6. Chronic bilateral low back pain with right-sided sciatica -  re-started Flexeril at HS - cyclobenzaprine (FLEXERIL) 10 MG tablet; Take 1 tablet (10 mg total) by mouth at bedtime.  Dispense: 90 tablet; Refill: 0    Labs/tests ordered:  None  Next appt:  6 months

## 2022-04-20 ENCOUNTER — Telehealth: Payer: Self-pay | Admitting: Cardiology

## 2022-04-20 DIAGNOSIS — I4719 Other supraventricular tachycardia: Secondary | ICD-10-CM

## 2022-04-20 DIAGNOSIS — I471 Supraventricular tachycardia, unspecified: Secondary | ICD-10-CM

## 2022-04-20 MED ORDER — METOPROLOL SUCCINATE ER 50 MG PO TB24: 50.0000 mg | ORAL_TABLET | Freq: Every day | ORAL | 3 refills | Status: DC

## 2022-04-20 NOTE — Telephone Encounter (Signed)
Patient calling in and requesting refill of 50 mg Toprol. She states that the last time she was in the ED for SVT (04/03/22) her toprol dose was increased to 100 mg but she has not been taking it this way. She states she takes 50 mg Toprol daily and then takes an extra 50 mg dose if she experiences palpitations. She states she prefers this dosing and requests refill of 50 mg dose with extra tablets for PRN dosing when she was palpitations. She states she has not been having many episodes of palpitations lately. Patient has also been referred to EP for palpitations and that appointment is 05/12/22. Discussed with Dr. Johney Frame, refill order placed for 50 mg Toprol with extra pills for PRN doses. Called patient to check pharmacy and make her aware of dose and quantity being called in, patient verbalizes understanding.

## 2022-04-20 NOTE — Telephone Encounter (Signed)
Pt c/o medication issue:  1. Name of Medication: metoprolol succinate (TOPROL-XL) 50 MG 24 hr tablet [id number]   2. How are you currently taking this medication (dosage and times per day)?   3. Are you having a reaction (difficulty breathing--STAT)?   4. What is your medication issue? Patient states she is having issues getting this filled at pharmacy because 100 mg tab was called in.

## 2022-04-29 ENCOUNTER — Ambulatory Visit
Admission: RE | Admit: 2022-04-29 | Discharge: 2022-04-29 | Disposition: A | Discharge status: Home / Self Care | Payer: Medicare PPO | Source: Ambulatory Visit | Attending: Nephrology | Admitting: Nephrology

## 2022-04-29 DIAGNOSIS — D631 Anemia in chronic kidney disease: Secondary | ICD-10-CM

## 2022-04-29 DIAGNOSIS — N189 Chronic kidney disease, unspecified: Secondary | ICD-10-CM | POA: Diagnosis not present

## 2022-04-29 DIAGNOSIS — N1831 Chronic kidney disease, stage 3a: Secondary | ICD-10-CM

## 2022-04-29 DIAGNOSIS — N2889 Other specified disorders of kidney and ureter: Secondary | ICD-10-CM | POA: Diagnosis not present

## 2022-04-29 DIAGNOSIS — E785 Hyperlipidemia, unspecified: Secondary | ICD-10-CM

## 2022-04-29 DIAGNOSIS — N2581 Secondary hyperparathyroidism of renal origin: Secondary | ICD-10-CM

## 2022-04-29 DIAGNOSIS — I350 Nonrheumatic aortic (valve) stenosis: Secondary | ICD-10-CM

## 2022-04-29 DIAGNOSIS — E1169 Type 2 diabetes mellitus with other specified complication: Secondary | ICD-10-CM

## 2022-05-05 DIAGNOSIS — N1831 Chronic kidney disease, stage 3a: Secondary | ICD-10-CM | POA: Diagnosis not present

## 2022-05-12 ENCOUNTER — Ambulatory Visit: Payer: Medicare PPO | Attending: Internal Medicine | Admitting: Internal Medicine

## 2022-05-12 ENCOUNTER — Encounter: Payer: Self-pay | Admitting: Internal Medicine

## 2022-05-12 VITALS — BP 116/72 | HR 76 | Ht 64.0 in | Wt 185.6 lb

## 2022-05-12 DIAGNOSIS — I471 Supraventricular tachycardia, unspecified: Secondary | ICD-10-CM

## 2022-05-12 DIAGNOSIS — I4719 Other supraventricular tachycardia: Secondary | ICD-10-CM

## 2022-05-12 NOTE — Patient Instructions (Addendum)
Medication Instructions:  Your physician recommends that you continue on your current medications as directed. Please refer to the Current Medication list given to you today.  *If you need a refill on your cardiac medications before your next appointment, please call your pharmacy*  Lab Work: None ordered.  If you have labs (blood work) drawn today and your tests are completely normal, you will receive your results only by: [place] (if you have MyChart) OR A paper copy in the mail If you have any lab test that is abnormal or we need to change your treatment, we will call you to review the results.  Testing/Procedures: SVT Ablation ordered with Carto by Dr. Cristopher Peru.     Follow-Up: Dr. Cristopher Peru ordered DATES for SVT Ablation with Carto.  Please see dates below: ( Pt selected May 13, and scheduling with Lab is complete. )  Pt needs letter, labs drawn / lab draw date.    May: 7, 13, 20, 21, 31 June: 10, 12, 17, 26  Please select a Month and Day, and we will schedule your procedure.  Please call [phone] when you select a date that works best for you.    Provider:   Cristopher Peru, MD{or one of the following Advanced Practice Providers on your designated Care Team:   Tommye Standard, [place] Legrand Como "Jonni Sanger" Chalmers Cater, [place]  Cardiac Ablation Cardiac ablation is a procedure to destroy, or ablate, a small amount of heart tissue that is causing problems. The heart has many electrical connections. Sometimes, these connections are abnormal and can cause the heart to beat very fast or irregularly. Ablating the abnormal areas can improve the heart's rhythm or return it to normal. Ablation may be done for people who: Have irregular or rapid heartbeats (arrhythmias). Have Wolff-Parkinson-White syndrome. Have taken medicines for an arrhythmia that did not work or caused side effects. Have a high-risk heartbeat that may be life-threatening. Tell a health care provider about: Any allergies  you have. All medicines you are taking, including vitamins, herbs, eye drops, creams, and over-the-counter medicines. Any problems you or family members have had with anesthesia. Any bleeding problems you have. Any surgeries you have had. Any medical conditions you have. Whether you are pregnant or may be pregnant. What are the risks? Your health care provider will talk with you about risks. These may include: Infection. Bruising and bleeding. Stroke or blood clots. Damage to nearby structures or organs. Allergic reaction to medicines or dyes. Needing a pacemaker if the heart gets damaged. A pacemaker is a device that helps the heart beat normally. Failure of the procedure. A repeat procedure may be needed. What happens before the procedure? Medicines Ask your health care provider about: Changing or stopping your regular medicines. These include any heart rhythm medicines, diabetes medicines, or blood thinners you take. Taking medicines such as aspirin and ibuprofen. These medicines can thin your blood. Do not take them unless your health care provider tells you to. Taking over-the-counter medicines, vitamins, herbs, and supplements. General instructions Follow instructions from your health care provider about what you may eat and drink. If you will be going home right after the procedure, plan to have a responsible adult: Take you home from the hospital or clinic. You will not be allowed to drive. Care for you for the time you are told. Ask your health care provider what steps will be taken to prevent infection. What happens during the procedure?  An IV will be inserted into one of your veins.  You may be given: A sedative. This helps you relax. Anesthesia. This will: Numb certain areas of your body. An incision will be made in your neck or your groin. A needle will be inserted through the incision and into a large vein in your neck or groin. The small, thin tube (catheter) will  be inserted through the needle and moved to your heart. A type of X-ray (fluoroscopy) will be used to help guide the catheter and provide images of the heart on a monitor. Dye may be injected through the catheter to help your surgeon see the area of the heart that needs treatment. Electrical currents will be sent from the catheter to destroy heart tissue in certain areas. There are three types of energy that may be used to do this: Heat (radiofrequency energy). Laser energy. Extreme cold (cryoablation). When the tissue has been destroyed, the catheter will be removed. Pressure will be held on the insertion area to prevent bleeding. A bandage (dressing) will be placed over the insertion area. The procedure may vary among health care providers and hospitals. What happens after the procedure? Your blood pressure, heart rate and rhythm, breathing rate, and blood oxygen level will be monitored until you leave the hospital or clinic. Your insertion area will be checked for bleeding. You will need to lie still for a few hours. If your groin was used, you will need to keep your leg straight for a few hours after the catheter is removed. This information is not intended to replace advice given to you by your health care provider. Make sure you discuss any questions you have with your health care provider. Document Revised: 07/20/2021 Document Reviewed: 07/20/2021 Elsevier Patient Education  [place].

## 2022-05-12 NOTE — Progress Notes (Signed)
HPI Nicole Graves is referred by Dr. Radford [name] for evaluation of SVT. She is a pleasant 77 yo woman with AS s/p TAVR, HTN, who had had palpitations for about 5 years but over the last year, her symptoms have increased in frequency and severity despite beta blocker therapy with metoprolol. She has not had syncope. When she goes into SVT she sob. She has had documented SVT at 160/min. These spells can last over an hour. Allergies  Allergen Reactions   Sulfa Antibiotics Nausea And Vomiting and Other (See Comments)   Ativan [Lorazepam] Other (See Comments)    Hyperactive    Tizanidine Hcl Other (See Comments)    Pt does not remember      Current Outpatient Medications  Medication Sig Dispense Refill   acetaminophen (TYLENOL) 650 MG CR tablet Take 1,300 mg by mouth in the morning and at bedtime.     alendronate (FOSAMAX) 70 MG tablet Take 1 tablet by mouth every Thursday.     amoxicillin (AMOXIL) 500 MG tablet Take by mouth. TAKE 4 TABLETS BY MOUTH PRIOR TO DENTAL APPOINTMENTS     aspirin EC 81 MG tablet Take 81 mg by mouth daily. Swallow whole.     atorvastatin (LIPITOR) 10 MG tablet Take 10 mg by mouth at bedtime.      buPROPion (WELLBUTRIN XL) 300 MG 24 hr tablet Take 300 mg by mouth daily after breakfast.     cyclobenzaprine (FLEXERIL) 10 MG tablet Take 1 tablet (10 mg total) by mouth at bedtime. 90 tablet 0   DULoxetine (CYMBALTA) 30 MG capsule Take 90 mg by mouth daily after breakfast.     famotidine (PEPCID) 20 MG tablet Take 20 mg by mouth daily.     gabapentin (NEURONTIN) 300 MG capsule Take 300 mg by mouth 3 (three) times daily.     lidocaine (LIDODERM) 5 % Place 1 patch onto the skin daily. Remove & Discard patch within 12 hours or as directed by MD. Apply to site of pain 30 patch 0   Melatonin 5 MG CAPS Take 1 capsule by mouth at bedtime.     metoprolol succinate (TOPROL-XL) 50 MG 24 hr tablet Take 1 tablet (50 mg total) by mouth daily. Take with or immediately following a meal.  You may take an extra 50 mg dose if you experience palpitations. 135 tablet 3   No current facility-administered medications for this visit.     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 Ali Lowe and Dr. Cyndia Bent   Severe aortic stenosis    Stage 3b chronic kidney disease (CKD) (HCC)    SVT (supraventricular tachycardia)    TR (tricuspid regurgitation) 09/19/2016   mild by echo 09/2021    ROS:   All systems reviewed and negative except as noted in the HPI.   Past Surgical History:  Procedure Laterality  Date   CESAREAN SECTION  1964   [facility]   removal extra fat inner thighs   EYE SURGERY Bilateral     Cataract extraction with IOL   INTRAOPERATIVE TRANSTHORACIC ECHOCARDIOGRAM N/A 03/16/2021   Procedure: INTRAOPERATIVE TRANSTHORACIC ECHOCARDIOGRAM;  Surgeon: Early Osmond, MD;  Location: [place];  Service: Open Heart Surgery;  Laterality: N/A;   RIGHT HEART CATH AND CORONARY ANGIOGRAPHY N/A 02/17/2021   Procedure: RIGHT HEART CATH AND CORONARY ANGIOGRAPHY;  Surgeon: Early Osmond, MD;  Location: [place] [facility];  Service: Cardiovascular;  Laterality: N/A;   TOTAL KNEE ARTHROPLASTY Right 11/30/2015   Procedure: RIGHT TOTAL KNEE ARTHROPLASTY;  Surgeon: Gaynelle Arabian, MD;  Location: [facility];  Service: Orthopedics;  Laterality: Right;   TOTAL KNEE ARTHROPLASTY Left 11/14/2016   Procedure: LEFT TOTAL KNEE ARTHROPLASTY;  Surgeon:  Gaynelle Arabian, MD;  Location: [facility];  Service: Orthopedics;  Laterality: Left;   TRANSCATHETER AORTIC VALVE REPLACEMENT, TRANSFEMORAL Right 03/16/2021   Procedure: TRANSCATHETER AORTIC VALVE REPLACEMENT, TRANSFEMORAL;  Surgeon: Early Osmond, MD;  Location: [place];  Service: Open Heart Surgery;  Laterality: Right;     Family History  Problem Relation Age of Onset   Colon cancer Mother    Heart attack Brother    Hypertension Brother    Stroke Neg Hx      Social History   Socioeconomic History   Marital status: Divorced    Spouse name: Not on file   Number of children: Not on file   Years of education: Not on file   Highest education level: Not on file  Occupational History   Not on file  Tobacco Use   Smoking status: Never   Smokeless tobacco: Never  Vaping Use   Vaping Use: Never used  Substance and Sexual Activity   Alcohol use: No   Drug use: No   Sexual activity: Not on file  Other Topics Concern   Not on file  Social History Narrative   Not on file   Social Determinants of Health   Financial Resource Strain: Not on file  Food Insecurity: Not on file  Transportation Needs: Not on file  Physical Activity: Not on file  Stress: Not on file  Social Connections: Not on file  Intimate Partner Violence: Not on file     BP 116/72   Pulse 76   Ht 5\' 4"  (1.626 m)   Wt 185 lb 9.6 oz (84.2 kg)   SpO2 97%   BMI 31.86 kg/m   Physical Exam:  Well appearing NAD HEENT: Unremarkable Neck:  No JVD, no thyromegally Lymphatics:  No adenopathy Back:  No CVA tenderness Lungs:  Clear with no wheezes HEART:  Regular rate rhythm, no murmurs, no rubs, no clicks Abd:  soft, positive bowel sounds, no organomegally, no rebound, no guarding Ext:  2 plus pulses, no edema, no cyanosis, no clubbing Skin:  No rashes no nodules Neuro:  CN II through XII intact, motor grossly intact  EKG - NSR  Assess/Plan: SVT - I have discussed the treatment options with the patient and  her daughter. Continued beta blocker as well as adding an AA drug vs catheter ablation were all a consideration. I have reviewed the indications/risks/benefits/goals/expectations of EP study and ablation with the patient and her daughter and they wish to proceed.  Carleene Overlie Zaylia Riolo,MD

## 2022-05-23 ENCOUNTER — Other Ambulatory Visit (HOSPITAL_COMMUNITY): Payer: Self-pay | Admitting: Nephrology

## 2022-05-23 DIAGNOSIS — N2889 Other specified disorders of kidney and ureter: Secondary | ICD-10-CM

## 2022-05-27 ENCOUNTER — Ambulatory Visit (HOSPITAL_COMMUNITY)
Admission: RE | Admit: 2022-05-27 | Discharge: 2022-05-27 | Disposition: A | Discharge status: Home / Self Care | Payer: Medicare PPO | Source: Ambulatory Visit | Attending: Nephrology | Admitting: Nephrology

## 2022-05-27 DIAGNOSIS — N281 Cyst of kidney, acquired: Secondary | ICD-10-CM | POA: Diagnosis not present

## 2022-05-27 DIAGNOSIS — N2889 Other specified disorders of kidney and ureter: Secondary | ICD-10-CM

## 2022-05-27 MED ORDER — GADOBUTROL 1 MMOL/ML IV SOLN
9.0000 mL | Freq: Once | INTRAVENOUS | Status: AC | PRN
  Administered 2022-05-27: 9 mL via INTRAVENOUS

## 2022-06-11 ENCOUNTER — Emergency Department (HOSPITAL_BASED_OUTPATIENT_CLINIC_OR_DEPARTMENT_OTHER)
Admission: EM | Admit: 2022-06-11 | Discharge: 2022-06-11 | Disposition: A | Discharge status: Home / Self Care | Payer: Medicare PPO | Attending: Emergency Medicine | Admitting: Emergency Medicine

## 2022-06-11 ENCOUNTER — Emergency Department (HOSPITAL_BASED_OUTPATIENT_CLINIC_OR_DEPARTMENT_OTHER): Payer: Medicare PPO | Admitting: Radiology

## 2022-06-11 ENCOUNTER — Other Ambulatory Visit: Payer: Self-pay

## 2022-06-11 ENCOUNTER — Emergency Department (HOSPITAL_BASED_OUTPATIENT_CLINIC_OR_DEPARTMENT_OTHER): Payer: Medicare PPO

## 2022-06-11 ENCOUNTER — Encounter (HOSPITAL_BASED_OUTPATIENT_CLINIC_OR_DEPARTMENT_OTHER): Payer: Self-pay | Admitting: Emergency Medicine

## 2022-06-11 DIAGNOSIS — I471 Supraventricular tachycardia, unspecified: Secondary | ICD-10-CM

## 2022-06-11 DIAGNOSIS — N183 Chronic kidney disease, stage 3 unspecified: Secondary | ICD-10-CM | POA: Insufficient documentation

## 2022-06-11 DIAGNOSIS — J9811 Atelectasis: Secondary | ICD-10-CM | POA: Diagnosis not present

## 2022-06-11 DIAGNOSIS — Z7982 Long term (current) use of aspirin: Secondary | ICD-10-CM | POA: Insufficient documentation

## 2022-06-11 DIAGNOSIS — D72829 Elevated white blood cell count, unspecified: Secondary | ICD-10-CM | POA: Diagnosis not present

## 2022-06-11 DIAGNOSIS — K449 Diaphragmatic hernia without obstruction or gangrene: Secondary | ICD-10-CM | POA: Diagnosis not present

## 2022-06-11 DIAGNOSIS — I129 Hypertensive chronic kidney disease with stage 1 through stage 4 chronic kidney disease, or unspecified chronic kidney disease: Secondary | ICD-10-CM | POA: Insufficient documentation

## 2022-06-11 DIAGNOSIS — R079 Chest pain, unspecified: Secondary | ICD-10-CM | POA: Diagnosis not present

## 2022-06-11 DIAGNOSIS — Z79899 Other long term (current) drug therapy: Secondary | ICD-10-CM | POA: Diagnosis not present

## 2022-06-11 DIAGNOSIS — R0602 Shortness of breath: Secondary | ICD-10-CM | POA: Diagnosis present

## 2022-06-11 LAB — CBC
HCT: 40.3 % (ref 36.0–46.0)
Hemoglobin: 12.7 g/dL (ref 12.0–15.0)
MCH: 30.2 pg (ref 26.0–34.0)
MCHC: 31.5 g/dL (ref 30.0–36.0)
MCV: 96 fL (ref 80.0–100.0)
Platelets: 299 10*3/uL (ref 150–400)
RBC: 4.2 MIL/uL (ref 3.87–5.11)
RDW: 13.3 % (ref 11.5–15.5)
WBC: 11.1 10*3/uL — ABNORMAL HIGH (ref 4.0–10.5)
nRBC: 0 % (ref 0.0–0.2)

## 2022-06-11 LAB — BASIC METABOLIC PANEL
Anion gap: 9 (ref 5–15)
BUN: 28 mg/dL — ABNORMAL HIGH (ref 8–23)
CO2: 25 mmol/L (ref 22–32)
Calcium: 9 mg/dL (ref 8.9–10.3)
Chloride: 102 mmol/L (ref 98–111)
Creatinine, Ser: 1.35 mg/dL — ABNORMAL HIGH (ref 0.44–1.00)
GFR, Estimated: 40 mL/min — ABNORMAL LOW (ref >60)
Glucose, Bld: 123 mg/dL — ABNORMAL HIGH (ref 70–99)
Potassium: 4.3 mmol/L (ref 3.5–5.1)
Sodium: 136 mmol/L (ref 135–145)

## 2022-06-11 LAB — TROPONIN I (HIGH SENSITIVITY)
Troponin I (High Sensitivity): 28 ng/L — ABNORMAL HIGH (ref <18)
Troponin I (High Sensitivity): 53 ng/L — ABNORMAL HIGH (ref <18)

## 2022-06-11 MED ORDER — ADENOSINE 6 MG/2ML IV SOLN
12.0000 mg | Freq: Once | INTRAVENOUS | Status: AC
  Administered 2022-06-11: 12 mg via INTRAVENOUS
  Filled 2022-06-11: qty 4

## 2022-06-11 NOTE — ED Provider Notes (Signed)
Accepted handoff at shift change from Jannifer Hick, PA-C. Please see prior provider note for more detail.   Briefly: Patient is 77 y.o. with history of SVT anemia, paroxysmal atrial tachycardia CKD and hypertension presenting for shortness of breath.  DDX: concern for SVT, ACS, PE, CHF  Plan: Ambulate patient and assess for respiratory symptoms.  Also reassess level of chest pain.  Follow-up on second troponin.  Physical Exam  BP 109/74   Pulse 86   Temp 99 F (37.2 C) (Oral)   Resp 16   SpO2 100%   Physical Exam  Procedures  Procedures  ED Course / MDM    Medical Decision Making Amount and/or Complexity of Data Reviewed Labs: ordered. Radiology: ordered.  Risk Prescription drug management.   Patient was able to ambulate around the unit with relative ease.  Denied tachypnea, increased work of breathing or shortness of breath at that time.  Sats were maintained in the low to mid 90s.  Patient stated that on reassessment her chest pain is still 2/10.  Second troponin was 53 in comparison to 28.  Suspect it is likely related stress on the heart secondary to SVT earlier during this encounter.  Patient overall expressed that she felt much better with no signs of respiratory distress while ambulating.  Now in sinus rhythm.  Felt that given a clinically well she appears with improvement of her chest pain as well she is appropriate to discharge and follow-up with her PCP and cardiologist.  I also discussed pertinent return precautions.  Vital stable at discharge.       Gareth Eagle, PA-C 06/11/22 2209    Gwyneth Sprout, MD 06/11/22 [phone]

## 2022-06-11 NOTE — Discharge Instructions (Addendum)
Evaluation today revealed that you were in SVT, this is likely contributing in someway to your shortness of breath.  He did convert back to normal sinus rhythm with adenosine.  Your second troponin level was slightly more elevated than the first.  Suspect it is related to the fact that you are in SVT today.  Given how clinically well you appear and the fact that you are feeling better yourself, do feel like you are okay to be discharged and follow-up with your cardiologist and your PCP.  If you start to have worsening chest pain, shortness of breath or feeling of palpitations, trouble breathing, syncope or any other concerning symptom please return emerged part for further evaluation.

## 2022-06-11 NOTE — ED Notes (Signed)
Pt ambulated on RA w/pulse ox. Pt respiratory status remained stable throughout ambulation with sats of 91-94% w/out increased WOB or SOB. Pt tolerated well. PA Roxan Hockey notified of O2 walk results.

## 2022-06-11 NOTE — ED Notes (Signed)
Reviewed AVS with patient, patient expressed understanding of directions, denies further questions at this time. Provider at bedside reviewing results as well.

## 2022-06-11 NOTE — ED Triage Notes (Signed)
Pt started feeling heart race, short of breath,just riding in the car. Started about 12. Pt was on the way home.

## 2022-06-11 NOTE — ED Provider Notes (Signed)
[place] EMERGENCY DEPARTMENT AT [facility] Provider Note   CSN: [id number] Arrival date & time: 06/11/22  1700     History No chief complaint on file.   Nicole Graves is a 77 y.o. female with history of SVT, anemia, anxiety, arthritis, chronic low back pain, GERD, HTN, paroxysmal atrial tachycardia, CKD stage III. Patient presents to ED with shortness of breath. Patient reports this began at 12PM as her and her daughter were traveling through [place], [place]. Patient daughter states they turned around at symptom onset and drove straight to ED. Patient scheduled for ablation on May 13 with Dr. Ladona Ridgel. Patient has history of SVT. Patient clearly in SVT on monitor with rate in 140s. Chart review shows patient has history of this, most recently on 2/18. Patient states she took metoprolol this morning as scheduled. Patient endorsing chest pain which she states has not occurred in the past with her instances of SVT. States it radiates between her anterior shoulders but denies back pain. She is endorsing shortness of breath. She is also endorsing nausea without vomiting. She denies recent fevers, leg swelling, lightheadedness, dizziness, syncope.   HPI     Home Medications Prior to Admission medications   Medication Sig Start Date End Date Taking? Authorizing Provider  acetaminophen (TYLENOL) 650 MG CR tablet Take 1,300 mg by mouth in the morning and at bedtime.    [provider]  alendronate (FOSAMAX) 70 MG tablet Take 1 tablet by mouth every Thursday.    [provider]  amoxicillin (AMOXIL) 500 MG tablet Take by mouth. TAKE 4 TABLETS BY MOUTH PRIOR TO DENTAL APPOINTMENTS    [provider]  aspirin EC 81 MG tablet Take 81 mg by mouth daily. Swallow whole.    [provider]  atorvastatin (LIPITOR) 10 MG tablet Take 10 mg by mouth at bedtime.  02/11/15   [provider]  buPROPion (WELLBUTRIN XL) 300 MG 24 hr tablet Take 300 mg by mouth  daily after breakfast.    [provider]  cyclobenzaprine (FLEXERIL) 10 MG tablet Take 1 tablet (10 mg total) by mouth at bedtime. 04/14/22   Medina-Vargas, Monina C, NP  DULoxetine (CYMBALTA) 30 MG capsule Take 90 mg by mouth daily after breakfast.    [provider]  famotidine (PEPCID) 20 MG tablet Take 20 mg by mouth daily.    [provider]  gabapentin (NEURONTIN) 300 MG capsule Take 300 mg by mouth 3 (three) times daily. 12/20/19   [provider]  lidocaine (LIDODERM) 5 % Place 1 patch onto the skin daily. Remove & Discard patch within 12 hours or as directed by MD. Apply to site of pain 09/27/21   Pokhrel, Rebekah Chesterfield, MD  Melatonin 5 MG CAPS Take 1 capsule by mouth at bedtime.    [provider]  metoprolol succinate (TOPROL-XL) 50 MG 24 hr tablet Take 1 tablet (50 mg total) by mouth daily. Take with or immediately following a meal. You may take an extra 50 mg dose if you experience palpitations. 04/20/22   Meriam Sprague, MD      Allergies    Sulfa antibiotics, Ativan [lorazepam], and Tizanidine hcl    Review of Systems   Review of Systems  Respiratory:  Positive for shortness of breath.   Cardiovascular:  Positive for chest pain. Negative for leg swelling.  Gastrointestinal:  Positive for nausea. Negative for vomiting.  All other systems reviewed and are negative.   Physical Exam Updated Vital Signs  BP (!) 92/57   Pulse 93   Temp 98.7 F (37.1 C) (Oral)   Resp (!) 24   SpO2 100%  Physical Exam Vitals and nursing note reviewed.  Constitutional:      General: She is not in acute distress.    Appearance: Normal appearance. She is not ill-appearing, toxic-appearing or diaphoretic.  HENT:     Head: Normocephalic and atraumatic.     Nose: Nose normal.     Mouth/Throat:     Mouth: Mucous membranes are moist.     Pharynx: Oropharynx is clear.  Eyes:     Extraocular Movements: Extraocular movements intact.     Conjunctiva/sclera:  Conjunctivae normal.     Pupils: Pupils are equal, round, and reactive to light.  Cardiovascular:     Rate and Rhythm: Regular rhythm. Tachycardia present.  Pulmonary:     Effort: Pulmonary effort is normal. No respiratory distress.     Breath sounds: Normal breath sounds. No wheezing or rales.  Abdominal:     General: Abdomen is flat. Bowel sounds are normal.     Palpations: Abdomen is soft.     Tenderness: There is no abdominal tenderness.  Musculoskeletal:     Cervical back: Normal range of motion and neck supple. No tenderness.  Skin:    General: Skin is warm and dry.     Capillary Refill: Capillary refill takes less than 2 seconds.  Neurological:     Mental Status: She is alert and oriented to person, place, and time.     ED Results / Procedures / Treatments   Labs (all labs ordered are listed, but only abnormal results are displayed) Labs Reviewed  BASIC METABOLIC PANEL - Abnormal; Notable for the following components:      Result Value   Glucose, Bld 123 (*)    BUN 28 (*)    Creatinine, Ser 1.35 (*)    GFR, Estimated 40 (*)    All other components within normal limits  CBC - Abnormal; Notable for the following components:   WBC 11.1 (*)    All other components within normal limits  TROPONIN I (HIGH SENSITIVITY) - Abnormal; Notable for the following components:   Troponin I (High Sensitivity) 28 (*)    All other components within normal limits  TROPONIN I (HIGH SENSITIVITY)    EKG EKG Interpretation  Date/Time:  Saturday June 11 2022 17:54:04 EDT Ventricular Rate:  93 PR Interval:  154 QRS Duration: 89 QT Interval:  346 QTC Calculation: 431 R Axis:   52 Text Interpretation: Sinus rhythm Minimal ST elevation, inferior leads Supraventricular tachycardia RESOLVED SINCE PREVIOUS Confirmed by Gwyneth Sprout ([id number]) on 06/11/2022 6:34:15 PM  Radiology DG Chest Port 1 View  Result Date: 06/11/2022 CLINICAL DATA:  Chest pain EXAM: PORTABLE CHEST 1 VIEW  COMPARISON:  Chest x-ray 04/03/2022 FINDINGS: Cardiac stent present. The heart is mildly enlarged. Large hiatal hernia again noted. There is minimal atelectasis in the left lung base. There is no pleural effusion or pneumothorax identified. No acute fractures are seen. There severe degenerative changes of the left shoulder. IMPRESSION: 1. Mild cardiomegaly. 2. Minimal atelectasis in the left lung base. Electronically Signed   By: Darliss Cheney M.D.   On: 06/11/2022 18:52    Procedures Procedures   Medications Ordered in ED Medications  adenosine (ADENOCARD) 6 MG/2ML injection 12 mg (12 mg Intravenous Given 06/11/22 1749)    ED Course/ Medical Decision Making/ A&P  Medical Decision Making Amount and/or Complexity of Data Reviewed  Labs: ordered. Radiology: ordered.  Risk Prescription drug management.   77 year old female presents to the ED for evaluation.  Please see HPI for further details.  On examination the patient is tachycardic in the 140s with what appears to be SVT on the monitor.  This is confirmed on EKG.  Lung sounds are clear bilaterally, she is not hypoxic.  Abdomen soft and compressible.  Neurological examination at baseline.  Monitor, EKG showed paced and SVT.  Patient has history of SVT.  On chart review, appears that the patient has been broken into this rhythm before with 12 mg adenosine.  Attempted to control patient tachycardia with vagal maneuvers, patient SVT persists.  Notified attending Dr. Anitra Lauth of patient SVT.  She arrived at patient bedside.  Crash cart placed in room, respiratory therapy notified, defibrillator pads placed on patient.  Patient converted from SVT into sinus rhythm with 12 mg adenosine with immediate conversion to normal sinus rhythm.  Repeat EKG shows patient in normal sinus rhythm.  Patient CBC with leukocytosis to 11.1.  BMP with creatinine 1.35 which is baseline, no electrolyte derangement.  Troponin elevated at 28 however this should be  expected with the patient being in SVT for a long she was.  She reports that her symptoms began around 12 noon, she did not present to ED until 530 for evaluation because she drove from [facility].  On reassessment she reports her chest pain is decreased from a 9 out of 10 initially to a 2 out of 10.  At this time, the patient will be ambulated.  If the patient feels fine with ambulation, she can discharge home.  If patient feels unsteady on her feet, we will provide further workup.  The patient will be signed out to oncoming provider Riki Sheer, PA-C for further management.  Patient case discussed with oncoming provider.  All questions answered.  Final Clinical Impression(s) / ED Diagnoses Final diagnoses:  SVT (supraventricular tachycardia)    Rx / DC Orders ED Discharge Orders     None         Clent Ridges 06/11/22 1915    Gwyneth Sprout, MD 06/11/22 2306

## 2022-06-11 NOTE — ED Notes (Signed)
Adm 12mg  adenosine, 1749 immediate conversion to NSR

## 2022-06-13 ENCOUNTER — Telehealth: Payer: Self-pay | Admitting: Cardiology

## 2022-06-13 NOTE — Telephone Encounter (Signed)
Pt wanted to notify Dr. [name] that she was in the ER over the weekend with SVT. She states she was given adenosine to bring it down. She states she feels fine now.

## 2022-06-13 NOTE — Telephone Encounter (Signed)
Returned call to patient.  Patient in ED on 06/11/22 for SVT, per ED note she received 12mg  of adenosine and converted to SR.  Patient denies any SOB, CP, or nausea today. She states she feels "much better today."  Patient is scheduled for SVT ablation with Dr. Ladona Ridgel 06/27/22. Patient is coming in for pre-procedure labs on 06/14/22.  Will forward to Dr. Norris Cross nurse and Dr. Lubertha Basque nurse to review.

## 2022-06-14 ENCOUNTER — Ambulatory Visit: Payer: Medicare PPO | Attending: Internal Medicine

## 2022-06-14 DIAGNOSIS — I700 Atherosclerosis of aorta: Secondary | ICD-10-CM | POA: Diagnosis not present

## 2022-06-14 DIAGNOSIS — F325 Major depressive disorder, single episode, in full remission: Secondary | ICD-10-CM | POA: Diagnosis not present

## 2022-06-14 DIAGNOSIS — M4722 Other spondylosis with radiculopathy, cervical region: Secondary | ICD-10-CM | POA: Diagnosis not present

## 2022-06-14 DIAGNOSIS — N1831 Chronic kidney disease, stage 3a: Secondary | ICD-10-CM | POA: Diagnosis not present

## 2022-06-14 DIAGNOSIS — M810 Age-related osteoporosis without current pathological fracture: Secondary | ICD-10-CM | POA: Diagnosis not present

## 2022-06-14 DIAGNOSIS — M179 Osteoarthritis of knee, unspecified: Secondary | ICD-10-CM | POA: Diagnosis not present

## 2022-06-14 DIAGNOSIS — I4719 Other supraventricular tachycardia: Secondary | ICD-10-CM

## 2022-06-14 DIAGNOSIS — N1832 Chronic kidney disease, stage 3b: Secondary | ICD-10-CM | POA: Diagnosis not present

## 2022-06-14 DIAGNOSIS — I471 Supraventricular tachycardia, unspecified: Secondary | ICD-10-CM | POA: Diagnosis not present

## 2022-06-14 DIAGNOSIS — E7800 Pure hypercholesterolemia, unspecified: Secondary | ICD-10-CM | POA: Diagnosis not present

## 2022-06-15 LAB — BASIC METABOLIC PANEL
BUN/Creatinine Ratio: 16 (ref 12–28)
BUN: 20 mg/dL (ref 8–27)
CO2: 22 mmol/L (ref 20–29)
Calcium: 9.2 mg/dL (ref 8.7–10.3)
Chloride: 103 mmol/L (ref 96–106)
Creatinine, Ser: 1.26 mg/dL — ABNORMAL HIGH (ref 0.57–1.00)
Glucose: 88 mg/dL (ref 70–99)
Potassium: 4.3 mmol/L (ref 3.5–5.2)
Sodium: 140 mmol/L (ref 134–144)
eGFR: 43 mL/min/{1.73_m2} — ABNORMAL LOW (ref >59)

## 2022-06-15 LAB — CBC WITH DIFFERENTIAL/PLATELET
Basophils Absolute: 0 10*3/uL (ref 0.0–0.2)
Basos: 1 %
EOS (ABSOLUTE): 0.1 10*3/uL (ref 0.0–0.4)
Eos: 3 %
Hematocrit: 34.4 % (ref 34.0–46.6)
Hemoglobin: 11.7 g/dL (ref 11.1–15.9)
Immature Grans (Abs): 0 10*3/uL (ref 0.0–0.1)
Immature Granulocytes: 0 %
Lymphocytes Absolute: 1.1 10*3/uL (ref 0.7–3.1)
Lymphs: 20 %
MCH: 31.1 pg (ref 26.6–33.0)
MCHC: 34 g/dL (ref 31.5–35.7)
MCV: 92 fL (ref 79–97)
Monocytes Absolute: 0.4 10*3/uL (ref 0.1–0.9)
Monocytes: 7 %
Neutrophils Absolute: 3.6 10*3/uL (ref 1.4–7.0)
Neutrophils: 69 %
Platelets: 284 10*3/uL (ref 150–450)
RBC: 3.76 x10E6/uL — ABNORMAL LOW (ref 3.77–5.28)
RDW: 11.7 % (ref 11.7–15.4)
WBC: 5.2 10*3/uL (ref 3.4–10.8)

## 2022-06-15 NOTE — Telephone Encounter (Signed)
Called patient to review PRN metoprolol for palpitations. Patient verbalizes understanding and also teaches back vagal maneuvers. Patient agrees to notify our office if SVT returns between now and scheduled ablation.

## 2022-06-22 DIAGNOSIS — N1831 Chronic kidney disease, stage 3a: Secondary | ICD-10-CM | POA: Diagnosis not present

## 2022-06-22 DIAGNOSIS — N2889 Other specified disorders of kidney and ureter: Secondary | ICD-10-CM | POA: Diagnosis not present

## 2022-06-22 DIAGNOSIS — E1122 Type 2 diabetes mellitus with diabetic chronic kidney disease: Secondary | ICD-10-CM | POA: Diagnosis not present

## 2022-06-22 DIAGNOSIS — N2581 Secondary hyperparathyroidism of renal origin: Secondary | ICD-10-CM | POA: Diagnosis not present

## 2022-06-22 DIAGNOSIS — D631 Anemia in chronic kidney disease: Secondary | ICD-10-CM | POA: Diagnosis not present

## 2022-06-22 DIAGNOSIS — I471 Supraventricular tachycardia, unspecified: Secondary | ICD-10-CM | POA: Diagnosis not present

## 2022-06-24 NOTE — Pre-Procedure Instructions (Signed)
Instructed patient on the following items: Arrival time 0515 Nothing to eat or drink after midnight No meds AM of procedure Responsible person to drive you home and stay with you for 24 hrs  Last dose of Metoprolol 06/24/22.

## 2022-06-27 ENCOUNTER — Ambulatory Visit (HOSPITAL_COMMUNITY)
Admission: RE | Admit: 2022-06-27 | Discharge: 2022-06-27 | Disposition: A | Discharge status: Home / Self Care | Payer: Medicare PPO | Attending: Internal Medicine | Admitting: Internal Medicine

## 2022-06-27 ENCOUNTER — Encounter (HOSPITAL_COMMUNITY)
Admission: RE | Disposition: A | Discharge status: Home / Self Care | Payer: Self-pay | Source: Home / Self Care | Attending: Internal Medicine

## 2022-06-27 DIAGNOSIS — I471 Supraventricular tachycardia, unspecified: Secondary | ICD-10-CM | POA: Diagnosis not present

## 2022-06-27 DIAGNOSIS — Z8249 Family history of ischemic heart disease and other diseases of the circulatory system: Secondary | ICD-10-CM | POA: Insufficient documentation

## 2022-06-27 DIAGNOSIS — I4719 Other supraventricular tachycardia: Secondary | ICD-10-CM | POA: Diagnosis not present

## 2022-06-27 HISTORY — PX: SVT ABLATION: EP1225

## 2022-06-27 SURGERY — SVT ABLATION
Anesthesia: LOCAL

## 2022-06-27 MED ORDER — MIDAZOLAM HCL 5 MG/5ML IJ SOLN
INTRAMUSCULAR | Status: AC
  Filled 2022-06-27: qty 5

## 2022-06-27 MED ORDER — HEPARIN (PORCINE) IN NACL 1000-0.9 UT/500ML-% IV SOLN
INTRAVENOUS | Status: DC | PRN
  Administered 2022-06-27: 500 mL

## 2022-06-27 MED ORDER — BUPIVACAINE HCL (PF) 0.25 % IJ SOLN
INTRAMUSCULAR | Status: DC | PRN
  Administered 2022-06-27: 30 mL

## 2022-06-27 MED ORDER — FENTANYL CITRATE (PF) 100 MCG/2ML IJ SOLN
INTRAMUSCULAR | Status: DC | PRN
  Administered 2022-06-27 (×3): 12.5 ug via INTRAVENOUS
  Administered 2022-06-27: 25 ug via INTRAVENOUS
  Administered 2022-06-27 (×2): 12.5 ug via INTRAVENOUS

## 2022-06-27 MED ORDER — MIDAZOLAM HCL 5 MG/5ML IJ SOLN
INTRAMUSCULAR | Status: DC | PRN
  Administered 2022-06-27: 1 mg via INTRAVENOUS
  Administered 2022-06-27: 2 mg via INTRAVENOUS
  Administered 2022-06-27 (×4): 1 mg via INTRAVENOUS

## 2022-06-27 MED ORDER — SODIUM CHLORIDE 0.9 % IV SOLN: INTRAVENOUS | Status: DC

## 2022-06-27 MED ORDER — ONDANSETRON HCL 4 MG/2ML IJ SOLN: 4.0000 mg | Freq: Four times a day (QID) | INTRAMUSCULAR | Status: DC | PRN

## 2022-06-27 MED ORDER — SODIUM CHLORIDE 0.9% FLUSH: 3.0000 mL | INTRAVENOUS | Status: DC | PRN

## 2022-06-27 MED ORDER — FENTANYL CITRATE (PF) 100 MCG/2ML IJ SOLN
INTRAMUSCULAR | Status: AC
  Filled 2022-06-27: qty 2

## 2022-06-27 MED ORDER — ACETAMINOPHEN 325 MG PO TABS: 650.0000 mg | ORAL_TABLET | ORAL | Status: DC | PRN

## 2022-06-27 MED ORDER — SODIUM CHLORIDE 0.9 % IV SOLN: 250.0000 mL | INTRAVENOUS | Status: DC | PRN

## 2022-06-27 SURGICAL SUPPLY — 10 items
CATH EZ STEER NAV 4MM D-F CUR (ABLATOR) IMPLANT
CATH JOSEPH QUAD ALLRED 6F REP (CATHETERS) IMPLANT
CATH POLARIS X REPROCESSED (CATHETERS) IMPLANT
PACK EP LATEX FREE (CUSTOM PROCEDURE TRAY) ×1
PACK EP LF (CUSTOM PROCEDURE TRAY) ×1 IMPLANT
PAD DEFIB RADIO PHYSIO CONN (PAD) ×1 IMPLANT
PATCH CARTO3 (PAD) IMPLANT
SHEATH PINNACLE 6F 10CM (SHEATH) IMPLANT
SHEATH PINNACLE 7F 10CM (SHEATH) IMPLANT
SHEATH PINNACLE 8F 10CM (SHEATH) IMPLANT

## 2022-06-27 NOTE — Progress Notes (Signed)
Assumed care of pt at this time.

## 2022-06-27 NOTE — Progress Notes (Signed)
Patient and daughter was given discharge instructions. Both verbalized understanding. 

## 2022-06-27 NOTE — H&P (Signed)
    HPI [name] is referred by Dr. Turner for evaluation of SVT. She is a pleasant 77 yo woman with AS s/p TAVR, HTN, who had had palpitations for about 5 years but over the last year, her symptoms have increased in frequency and severity despite beta blocker therapy with metoprolol. She has not had syncope. When she goes into SVT she sob. She has had documented SVT at 160/min. These spells can last over an hour. Allergies  Allergen Reactions   Sulfa Antibiotics Nausea And Vomiting and Other (See Comments)   Ativan [Lorazepam] Other (See Comments)    Hyperactive    Tizanidine Hcl Other (See Comments)    Pt does not remember      Current Outpatient Medications  Medication Sig Dispense Refill   acetaminophen (TYLENOL) 650 MG CR tablet Take 1,300 mg by mouth in the morning and at bedtime.     alendronate (FOSAMAX) 70 MG tablet Take 1 tablet by mouth every Thursday.     amoxicillin (AMOXIL) 500 MG tablet Take by mouth. TAKE 4 TABLETS BY MOUTH PRIOR TO DENTAL APPOINTMENTS     aspirin EC 81 MG tablet Take 81 mg by mouth daily. Swallow whole.     atorvastatin (LIPITOR) 10 MG tablet Take 10 mg by mouth at bedtime.      buPROPion (WELLBUTRIN XL) 300 MG 24 hr tablet Take 300 mg by mouth daily after breakfast.     cyclobenzaprine (FLEXERIL) 10 MG tablet Take 1 tablet (10 mg total) by mouth at bedtime. 90 tablet 0   DULoxetine (CYMBALTA) 30 MG capsule Take 90 mg by mouth daily after breakfast.     famotidine (PEPCID) 20 MG tablet Take 20 mg by mouth daily.     gabapentin (NEURONTIN) 300 MG capsule Take 300 mg by mouth 3 (three) times daily.     lidocaine (LIDODERM) 5 % Place 1 patch onto the skin daily. Remove & Discard patch within 12 hours or as directed by MD. Apply to site of pain 30 patch 0   Melatonin 5 MG CAPS Take 1 capsule by mouth at bedtime.     metoprolol succinate (TOPROL-XL) 50 MG 24 hr tablet Take 1 tablet (50 mg total) by mouth daily. Take with or immediately following a meal.  You may take an extra 50 mg dose if you experience palpitations. 135 tablet 3   No current facility-administered medications for this visit.     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 Thukkani and Dr. Bartle   Severe aortic stenosis    Stage 3b chronic kidney disease (CKD) (HCC)    SVT (supraventricular tachycardia)    TR (tricuspid regurgitation) 09/19/2016   mild by echo 09/2021    ROS:   All systems reviewed and negative except as noted in the HPI.   Past Surgical History:  Procedure Laterality   Date   CESAREAN SECTION  1964   X2   CHOLECYSTECTOMY     COSMETIC SURGERY  1980   removal extra fat inner thighs   EYE SURGERY Bilateral     Cataract extraction with IOL   INTRAOPERATIVE TRANSTHORACIC ECHOCARDIOGRAM N/A 03/16/2021   Procedure: INTRAOPERATIVE TRANSTHORACIC ECHOCARDIOGRAM;  Surgeon: Thukkani, Arun K, MD;  Location: [facility] OR;  Service: Open Heart Surgery;  Laterality: N/A;   RIGHT HEART CATH AND CORONARY ANGIOGRAPHY N/A 02/17/2021   Procedure: RIGHT HEART CATH AND CORONARY ANGIOGRAPHY;  Surgeon: Thukkani, Arun K, MD;  Location: [facility];  Service: Cardiovascular;  Laterality: N/A;   TOTAL KNEE ARTHROPLASTY Right 11/30/2015   Procedure: RIGHT TOTAL KNEE ARTHROPLASTY;  Surgeon: Frank Aluisio, MD;  Location: [facility];  Service: Orthopedics;  Laterality: Right;   TOTAL KNEE ARTHROPLASTY Left 11/14/2016   Procedure: LEFT TOTAL KNEE ARTHROPLASTY;  Surgeon:  Aluisio, Frank, MD;  Location: [facility];  Service: Orthopedics;  Laterality: Left;   TRANSCATHETER AORTIC VALVE REPLACEMENT, TRANSFEMORAL Right 03/16/2021   Procedure: TRANSCATHETER AORTIC VALVE REPLACEMENT, TRANSFEMORAL;  Surgeon: Thukkani, Arun K, MD;  Location: [facility] OR;  Service: Open Heart Surgery;  Laterality: Right;     Family History  Problem Relation Age of Onset   Colon cancer Mother    Heart attack Brother    Hypertension Brother    Stroke Neg Hx      Social History   Socioeconomic History   Marital status: Divorced    Spouse name: Not on file   Number of children: Not on file   Years of education: Not on file   Highest education level: Not on file  Occupational History   Not on file  Tobacco Use   Smoking status: Never   Smokeless tobacco: Never  Vaping Use   Vaping Use: Never used  Substance and Sexual Activity   Alcohol use: No   Drug use: No   Sexual activity: Not on file  Other Topics Concern   Not on file  Social History Narrative   Not on file   Social Determinants of Health   Financial Resource Strain: Not on file  Food Insecurity: Not on file  Transportation Needs: Not on file  Physical Activity: Not on file  Stress: Not on file  Social Connections: Not on file  Intimate Partner Violence: Not on file     BP 116/72   Pulse 76   Ht 5' 4" (1.626 m)   Wt 185 lb 9.6 oz (84.2 kg)   SpO2 97%   BMI 31.86 kg/m   Physical Exam:  Well appearing NAD HEENT: Unremarkable Neck:  No JVD, no thyromegally Lymphatics:  No adenopathy Back:  No CVA tenderness Lungs:  Clear with no wheezes HEART:  Regular rate rhythm, no murmurs, no rubs, no clicks Abd:  soft, positive bowel sounds, no organomegally, no rebound, no guarding Ext:  2 plus pulses, no edema, no cyanosis, no clubbing Skin:  No rashes no nodules Neuro:  CN II through XII intact, motor grossly intact  EKG - NSR  Assess/Plan: SVT - I have discussed the treatment options with the patient and  her daughter. Continued beta blocker as well as adding an AA drug vs catheter ablation were all a consideration. I have reviewed the indications/risks/benefits/goals/expectations of EP study and ablation with the patient and her daughter and they wish to proceed.  Ibrahem Volkman,MD 

## 2022-06-27 NOTE — Discharge Instructions (Signed)

## 2022-06-27 NOTE — Progress Notes (Signed)
Pt ambulated to and from bathroom to void with no signs of oozing from right groin site  

## 2022-06-27 NOTE — Progress Notes (Signed)
85F, 26F, 29F venous sheaths to right groin removed.  Manual pressure held for 20 minutes.  Right groin level 0 pre and post pull.  Right DP pulse palpable post shealth pull.  Bedrest started at 10:15 am.  Gauze and tegaderm applied to right groin.  Instructions reviewed with patient.

## 2022-06-28 ENCOUNTER — Ambulatory Visit: Payer: Medicare PPO | Admitting: Cardiology

## 2022-06-28 ENCOUNTER — Encounter (HOSPITAL_COMMUNITY): Payer: Self-pay | Admitting: Internal Medicine

## 2022-07-18 DIAGNOSIS — H524 Presbyopia: Secondary | ICD-10-CM | POA: Diagnosis not present

## 2022-07-18 DIAGNOSIS — H52203 Unspecified astigmatism, bilateral: Secondary | ICD-10-CM | POA: Diagnosis not present

## 2022-07-18 DIAGNOSIS — H5213 Myopia, bilateral: Secondary | ICD-10-CM | POA: Diagnosis not present

## 2022-07-18 DIAGNOSIS — Z961 Presence of intraocular lens: Secondary | ICD-10-CM | POA: Diagnosis not present

## 2022-07-26 DIAGNOSIS — M5412 Radiculopathy, cervical region: Secondary | ICD-10-CM | POA: Diagnosis not present

## 2022-07-26 DIAGNOSIS — M5030 Other cervical disc degeneration, unspecified cervical region: Secondary | ICD-10-CM | POA: Diagnosis not present

## 2022-07-26 DIAGNOSIS — G894 Chronic pain syndrome: Secondary | ICD-10-CM | POA: Diagnosis not present

## 2022-07-26 DIAGNOSIS — Z1231 Encounter for screening mammogram for malignant neoplasm of breast: Secondary | ICD-10-CM | POA: Diagnosis not present

## 2022-07-26 DIAGNOSIS — M19012 Primary osteoarthritis, left shoulder: Secondary | ICD-10-CM | POA: Diagnosis not present

## 2022-07-29 ENCOUNTER — Ambulatory Visit: Payer: Medicare PPO | Admitting: Internal Medicine

## 2022-08-08 ENCOUNTER — Ambulatory Visit: Payer: Medicare PPO | Admitting: Internal Medicine

## 2022-08-08 ENCOUNTER — Telehealth: Payer: Self-pay | Admitting: Internal Medicine

## 2022-08-08 NOTE — Telephone Encounter (Signed)
Patient is unable to make appt that was today. She states that since not else is sooner then her appt with Dr. [name], she would just want to be seen then. Please advise

## 2022-08-08 NOTE — Telephone Encounter (Signed)
Spoke with the patient and have reschedule her appointment with Dr. Ladona Ridgel. She needs to follow up post SVT ablation.

## 2022-08-12 ENCOUNTER — Other Ambulatory Visit: Payer: Self-pay | Admitting: Cardiology

## 2022-08-12 ENCOUNTER — Other Ambulatory Visit: Payer: Self-pay | Admitting: Adult Health

## 2022-08-12 DIAGNOSIS — G8929 Other chronic pain: Secondary | ICD-10-CM

## 2022-08-16 ENCOUNTER — Ambulatory Visit
Admission: RE | Admit: 2022-08-16 | Discharge: 2022-08-16 | Disposition: A | Discharge status: Home / Self Care | Payer: Medicare PPO | Source: Ambulatory Visit | Attending: Internal Medicine | Admitting: Internal Medicine

## 2022-08-16 ENCOUNTER — Ambulatory Visit: Payer: Medicare PPO | Attending: Internal Medicine | Admitting: Internal Medicine

## 2022-08-16 ENCOUNTER — Encounter: Payer: Self-pay | Admitting: Internal Medicine

## 2022-08-16 VITALS — BP 114/68 | HR 79 | Ht 63.0 in | Wt 184.6 lb

## 2022-08-16 DIAGNOSIS — W19XXXA Unspecified fall, initial encounter: Secondary | ICD-10-CM

## 2022-08-16 DIAGNOSIS — J90 Pleural effusion, not elsewhere classified: Secondary | ICD-10-CM | POA: Diagnosis not present

## 2022-08-16 DIAGNOSIS — I4719 Other supraventricular tachycardia: Secondary | ICD-10-CM

## 2022-08-16 NOTE — Patient Instructions (Signed)
Medication Instructions:  Your physician recommends that you continue on your current medications as directed. Please refer to the Current Medication list given to you today.  *If you need a refill on your cardiac medications before your next appointment, please call your pharmacy*  Testing/Procedures: A chest x-ray takes a picture of the organs and structures inside the chest, including the heart, lungs, and blood vessels. This test can show several things, including, whether the heart is enlarges; whether fluid is building up in the lungs; and whether pacemaker / defibrillator leads are still in place.  Follow-Up: At [facility] you and your health needs are our priority.  As part of our continuing mission to provide you with exceptional heart care, we have created designated Provider Care Teams.  These Care Teams include your primary Cardiologist (physician) and Advanced Practice Providers (APPs -  Physician Assistants and Nurse Practitioners) who all work together to provide you with the care you need, when you need it.  Your next appointment:   As needed

## 2022-08-16 NOTE — Progress Notes (Signed)
HPI Nicole Graves returns today for followup. She is a pleasant 77 yo woman with SVT who underwent EP study and catheter ablation of AVNRT. She has done well from this perspective with no more symptoms. She fell about a week ago and has had pain over her right chest and also notes sob. She did not seek medical attention.  Allergies  Allergen Reactions   Sulfa Antibiotics Nausea And Vomiting and Other (See Comments)   Ativan [Lorazepam] Other (See Comments)    Hyperactive    Tizanidine Hcl Other (See Comments)    Pt does not remember      Current Outpatient Medications  Medication Sig Dispense Refill   acetaminophen (TYLENOL) 650 MG CR tablet Take 1,300 mg by mouth in the morning and at bedtime.     alendronate (FOSAMAX) 70 MG tablet Take 1 tablet by mouth every Thursday.     amoxicillin (AMOXIL) 500 MG tablet Take by mouth. TAKE 4 TABLETS BY MOUTH PRIOR TO DENTAL APPOINTMENTS     aspirin EC 81 MG tablet Take 81 mg by mouth daily. Swallow whole.     atorvastatin (LIPITOR) 10 MG tablet Take 10 mg by mouth at bedtime.      buPROPion (WELLBUTRIN XL) 300 MG 24 hr tablet Take 300 mg by mouth daily after breakfast.     cyclobenzaprine (FLEXERIL) 10 MG tablet Take 1 tablet (10 mg total) by mouth at bedtime. 90 tablet 0   DULoxetine (CYMBALTA) 30 MG capsule Take 90 mg by mouth daily after breakfast.     famotidine (PEPCID) 20 MG tablet Take 20 mg by mouth daily as needed for heartburn.     ferrous sulfate 325 (65 FE) MG tablet Take 325 mg by mouth daily.     gabapentin (NEURONTIN) 300 MG capsule Take 300 mg by mouth 3 (three) times daily.     lidocaine (LIDODERM) 5 % Place 1 patch onto the skin daily. Remove & Discard patch within 12 hours or as directed by MD. Apply to site of pain (Patient taking differently: Place 1 patch onto the skin daily as needed (pain). Remove & Discard patch within 12 hours or as directed by MD. Apply to site of pain) 30 patch 0   Melatonin 5 MG CAPS Take 1 capsule  by mouth at bedtime.     Menthol, Topical Analgesic, (BIOFREEZE EX) Apply 1 Application topically daily as needed (pain).     metoprolol succinate (TOPROL-XL) 50 MG 24 hr tablet Take 1 tablet (50 mg total) by mouth daily. Take with or immediately following a meal. You may take an extra 50 mg dose if you experience palpitations. 135 tablet 3   No current facility-administered medications for this visit.     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 Lynnette Caffey and Dr. Laneta Simmers   Severe aortic stenosis  Stage 3b chronic kidney disease (CKD) (HCC)    SVT (supraventricular tachycardia)    TR (tricuspid regurgitation) 09/19/2016   mild by echo 09/2021    ROS:   All systems reviewed and negative except as noted in the HPI.   Past Surgical History:  Procedure Laterality Date   CESAREAN SECTION  1964   X2   CHOLECYSTECTOMY     COSMETIC SURGERY  1980   removal extra fat inner thighs   EYE SURGERY Bilateral     Cataract extraction with IOL   INTRAOPERATIVE TRANSTHORACIC ECHOCARDIOGRAM N/A 03/16/2021   Procedure: INTRAOPERATIVE TRANSTHORACIC ECHOCARDIOGRAM;  Surgeon: Orbie Pyo, MD;  Location: [facility];  Service: Open Heart Surgery;  Laterality: N/A;   RIGHT HEART CATH AND CORONARY ANGIOGRAPHY N/A 02/17/2021   Procedure: RIGHT HEART CATH AND CORONARY ANGIOGRAPHY;  Surgeon: Orbie Pyo, MD;  Location: [facility];  Service: Cardiovascular;  Laterality: N/A;   SVT  ABLATION N/A 06/27/2022   Procedure: SVT ABLATION;  Surgeon: Marinus Maw, MD;  Location: [facility];  Service: Cardiovascular;  Laterality: N/A;   TOTAL KNEE ARTHROPLASTY Right 11/30/2015   Procedure: RIGHT TOTAL KNEE ARTHROPLASTY;  Surgeon: Ollen Gross, MD;  Location: [facility];  Service: Orthopedics;  Laterality: Right;   TOTAL KNEE ARTHROPLASTY Left 11/14/2016   Procedure: LEFT TOTAL KNEE ARTHROPLASTY;  Surgeon: Ollen Gross, MD;  Location: [facility];  Service: Orthopedics;  Laterality: Left;   TRANSCATHETER AORTIC VALVE REPLACEMENT, TRANSFEMORAL Right 03/16/2021   Procedure: TRANSCATHETER AORTIC VALVE REPLACEMENT, TRANSFEMORAL;  Surgeon: Orbie Pyo, MD;  Location: [facility] OR;  Service: Open Heart Surgery;  Laterality: Right;     Family History  Problem Relation Age of Onset   Colon cancer Mother    Heart attack Brother    Hypertension Brother    Stroke Neg Hx      Social History   Socioeconomic History   Marital status: Divorced    Spouse name: Not on file   Number of children: Not on file   Years of education: Not on file   Highest education level: Not on file  Occupational History   Not on file  Tobacco Use   Smoking status: Never   Smokeless tobacco: Never  Vaping Use   Vaping Use: Never used  Substance and Sexual Activity   Alcohol use: No   Drug use: No   Sexual activity: Not on file  Other Topics Concern   Not on file  Social History Narrative   Not on file   Social Determinants of Health   Financial Resource Strain: Not on file  Food Insecurity: Not on file  Transportation Needs: Not on file  Physical Activity: Not on file  Stress: Not on file  Social Connections: Not on file  Intimate Partner Violence: Not on file     BP 114/68   Pulse 79   Ht 5\' 3"  (1.6 m)   Wt 184 lb 9.6 oz (83.7 kg)   SpO2 96%   BMI 32.70 kg/m   Physical Exam:  Well appearing 77 yo woman, NAD HEENT: Unremarkable Neck:  No JVD, no thyromegally Lymphatics:  No  adenopathy Back:  No CVA tenderness Lungs:  Clear with no wheezes. Questionable decreased breath sounds. HEART:  Regular rate rhythm, no murmurs, no rubs, no clicks Abd:  soft, positive bowel sounds, no organomegally, no rebound, no guarding Ext:  2 plus pulses, no edema, no cyanosis, no clubbing Skin:  No rashes no nodules Neuro:  CN II through XII intact, motor grossly intact  EKG - nsr  Assess/Plan: SVT - she is doing well after catheter ablation. No additional symptoms. She will undergo watchful waiting. Fall - I have asked her to undergo a chest Xray as she is still symptomatic and did not undergo any eval immediately after she fell. Would like to rule out a PTX and/or rib fracture.  Sharlot Gowda Nicole Gargan,MD

## 2022-08-17 ENCOUNTER — Telehealth: Payer: Self-pay | Admitting: Internal Medicine

## 2022-08-17 NOTE — Telephone Encounter (Signed)
Left message for patient advising that chest x-ray results have not been reviewed but we will give her a call when they have been. Advised to call back with any questions.

## 2022-08-17 NOTE — Telephone Encounter (Signed)
Patient is requesting call back to get update on results.  

## 2022-08-19 ENCOUNTER — Inpatient Hospital Stay (HOSPITAL_COMMUNITY)
Admission: EM | Admit: 2022-08-19 | Discharge: 2022-08-22 | DRG: 186 | Disposition: A | Discharge status: Home / Self Care | Payer: Medicare PPO | Attending: Internal Medicine | Admitting: Internal Medicine

## 2022-08-19 ENCOUNTER — Other Ambulatory Visit: Payer: Self-pay

## 2022-08-19 ENCOUNTER — Emergency Department (HOSPITAL_COMMUNITY): Payer: Medicare PPO

## 2022-08-19 ENCOUNTER — Encounter (HOSPITAL_COMMUNITY): Payer: Self-pay

## 2022-08-19 ENCOUNTER — Telehealth: Payer: Self-pay | Admitting: Internal Medicine

## 2022-08-19 DIAGNOSIS — Z96653 Presence of artificial knee joint, bilateral: Secondary | ICD-10-CM | POA: Diagnosis present

## 2022-08-19 DIAGNOSIS — Z800 Family history of malignant neoplasm of digestive organs: Secondary | ICD-10-CM | POA: Diagnosis not present

## 2022-08-19 DIAGNOSIS — R0602 Shortness of breath: Secondary | ICD-10-CM | POA: Diagnosis not present

## 2022-08-19 DIAGNOSIS — I5032 Chronic diastolic (congestive) heart failure: Secondary | ICD-10-CM | POA: Diagnosis not present

## 2022-08-19 DIAGNOSIS — Z86010 Personal history of colonic polyps: Secondary | ICD-10-CM | POA: Diagnosis not present

## 2022-08-19 DIAGNOSIS — Z888 Allergy status to other drugs, medicaments and biological substances status: Secondary | ICD-10-CM | POA: Diagnosis not present

## 2022-08-19 DIAGNOSIS — W1830XA Fall on same level, unspecified, initial encounter: Secondary | ICD-10-CM | POA: Diagnosis present

## 2022-08-19 DIAGNOSIS — K219 Gastro-esophageal reflux disease without esophagitis: Secondary | ICD-10-CM | POA: Diagnosis present

## 2022-08-19 DIAGNOSIS — N1832 Chronic kidney disease, stage 3b: Secondary | ICD-10-CM | POA: Diagnosis not present

## 2022-08-19 DIAGNOSIS — E785 Hyperlipidemia, unspecified: Secondary | ICD-10-CM | POA: Diagnosis present

## 2022-08-19 DIAGNOSIS — I130 Hypertensive heart and chronic kidney disease with heart failure and stage 1 through stage 4 chronic kidney disease, or unspecified chronic kidney disease: Secondary | ICD-10-CM | POA: Diagnosis present

## 2022-08-19 DIAGNOSIS — D631 Anemia in chronic kidney disease: Secondary | ICD-10-CM | POA: Diagnosis present

## 2022-08-19 DIAGNOSIS — I517 Cardiomegaly: Secondary | ICD-10-CM | POA: Diagnosis not present

## 2022-08-19 DIAGNOSIS — Z7983 Long term (current) use of bisphosphonates: Secondary | ICD-10-CM

## 2022-08-19 DIAGNOSIS — J948 Other specified pleural conditions: Secondary | ICD-10-CM | POA: Diagnosis not present

## 2022-08-19 DIAGNOSIS — Z953 Presence of xenogenic heart valve: Secondary | ICD-10-CM

## 2022-08-19 DIAGNOSIS — G8929 Other chronic pain: Secondary | ICD-10-CM | POA: Diagnosis present

## 2022-08-19 DIAGNOSIS — M545 Low back pain, unspecified: Secondary | ICD-10-CM | POA: Diagnosis present

## 2022-08-19 DIAGNOSIS — R9431 Abnormal electrocardiogram [ECG] [EKG]: Secondary | ICD-10-CM | POA: Diagnosis present

## 2022-08-19 DIAGNOSIS — Z8249 Family history of ischemic heart disease and other diseases of the circulatory system: Secondary | ICD-10-CM

## 2022-08-19 DIAGNOSIS — N1831 Chronic kidney disease, stage 3a: Secondary | ICD-10-CM

## 2022-08-19 DIAGNOSIS — F418 Other specified anxiety disorders: Secondary | ICD-10-CM

## 2022-08-19 DIAGNOSIS — M25521 Pain in right elbow: Secondary | ICD-10-CM | POA: Diagnosis not present

## 2022-08-19 DIAGNOSIS — R0781 Pleurodynia: Secondary | ICD-10-CM | POA: Diagnosis present

## 2022-08-19 DIAGNOSIS — K449 Diaphragmatic hernia without obstruction or gangrene: Secondary | ICD-10-CM | POA: Diagnosis not present

## 2022-08-19 DIAGNOSIS — S46001A Unspecified injury of muscle(s) and tendon(s) of the rotator cuff of right shoulder, initial encounter: Secondary | ICD-10-CM | POA: Diagnosis not present

## 2022-08-19 DIAGNOSIS — Z7982 Long term (current) use of aspirin: Secondary | ICD-10-CM

## 2022-08-19 DIAGNOSIS — Z9049 Acquired absence of other specified parts of digestive tract: Secondary | ICD-10-CM | POA: Diagnosis not present

## 2022-08-19 DIAGNOSIS — R079 Chest pain, unspecified: Secondary | ICD-10-CM | POA: Diagnosis not present

## 2022-08-19 DIAGNOSIS — J9601 Acute respiratory failure with hypoxia: Secondary | ICD-10-CM | POA: Diagnosis present

## 2022-08-19 DIAGNOSIS — D72829 Elevated white blood cell count, unspecified: Secondary | ICD-10-CM | POA: Diagnosis present

## 2022-08-19 DIAGNOSIS — Z79899 Other long term (current) drug therapy: Secondary | ICD-10-CM

## 2022-08-19 DIAGNOSIS — R0609 Other forms of dyspnea: Secondary | ICD-10-CM | POA: Diagnosis not present

## 2022-08-19 DIAGNOSIS — M79601 Pain in right arm: Secondary | ICD-10-CM | POA: Diagnosis not present

## 2022-08-19 DIAGNOSIS — Z48813 Encounter for surgical aftercare following surgery on the respiratory system: Secondary | ICD-10-CM | POA: Diagnosis not present

## 2022-08-19 DIAGNOSIS — J9811 Atelectasis: Secondary | ICD-10-CM | POA: Diagnosis present

## 2022-08-19 DIAGNOSIS — R918 Other nonspecific abnormal finding of lung field: Secondary | ICD-10-CM | POA: Diagnosis not present

## 2022-08-19 DIAGNOSIS — M79621 Pain in right upper arm: Secondary | ICD-10-CM | POA: Diagnosis present

## 2022-08-19 DIAGNOSIS — G2401 Drug induced subacute dyskinesia: Secondary | ICD-10-CM | POA: Diagnosis present

## 2022-08-19 DIAGNOSIS — S299XXA Unspecified injury of thorax, initial encounter: Secondary | ICD-10-CM | POA: Diagnosis not present

## 2022-08-19 DIAGNOSIS — Z952 Presence of prosthetic heart valve: Secondary | ICD-10-CM

## 2022-08-19 DIAGNOSIS — Z882 Allergy status to sulfonamides status: Secondary | ICD-10-CM

## 2022-08-19 DIAGNOSIS — J90 Pleural effusion, not elsewhere classified: Secondary | ICD-10-CM | POA: Diagnosis not present

## 2022-08-19 DIAGNOSIS — M19011 Primary osteoarthritis, right shoulder: Secondary | ICD-10-CM | POA: Diagnosis not present

## 2022-08-19 DIAGNOSIS — I2510 Atherosclerotic heart disease of native coronary artery without angina pectoris: Secondary | ICD-10-CM | POA: Diagnosis present

## 2022-08-19 DIAGNOSIS — F419 Anxiety disorder, unspecified: Secondary | ICD-10-CM | POA: Diagnosis present

## 2022-08-19 DIAGNOSIS — M25511 Pain in right shoulder: Secondary | ICD-10-CM | POA: Diagnosis not present

## 2022-08-19 DIAGNOSIS — R846 Abnormal cytological findings in specimens from respiratory organs and thorax: Secondary | ICD-10-CM | POA: Diagnosis not present

## 2022-08-19 DIAGNOSIS — Z79890 Hormone replacement therapy (postmenopausal): Secondary | ICD-10-CM

## 2022-08-19 DIAGNOSIS — D649 Anemia, unspecified: Secondary | ICD-10-CM

## 2022-08-19 DIAGNOSIS — F32A Depression, unspecified: Secondary | ICD-10-CM | POA: Diagnosis present

## 2022-08-19 LAB — CBC
HCT: 30.3 % — ABNORMAL LOW (ref 36.0–46.0)
Hemoglobin: 9.1 g/dL — ABNORMAL LOW (ref 12.0–15.0)
MCH: 28.9 pg (ref 26.0–34.0)
MCHC: 30 g/dL (ref 30.0–36.0)
MCV: 96.2 fL (ref 80.0–100.0)
Platelets: 523 10*3/uL — ABNORMAL HIGH (ref 150–400)
RBC: 3.15 MIL/uL — ABNORMAL LOW (ref 3.87–5.11)
RDW: 14 % (ref 11.5–15.5)
WBC: 15 10*3/uL — ABNORMAL HIGH (ref 4.0–10.5)
nRBC: 0 % (ref 0.0–0.2)

## 2022-08-19 LAB — BASIC METABOLIC PANEL
Anion gap: 12 (ref 5–15)
BUN: 18 mg/dL (ref 8–23)
CO2: 24 mmol/L (ref 22–32)
Calcium: 9.1 mg/dL (ref 8.9–10.3)
Chloride: 100 mmol/L (ref 98–111)
Creatinine, Ser: 1.11 mg/dL — ABNORMAL HIGH (ref 0.44–1.00)
GFR, Estimated: 51 mL/min — ABNORMAL LOW (ref >60)
Glucose, Bld: 113 mg/dL — ABNORMAL HIGH (ref 70–99)
Potassium: 4.4 mmol/L (ref 3.5–5.1)
Sodium: 136 mmol/L (ref 135–145)

## 2022-08-19 LAB — TROPONIN I (HIGH SENSITIVITY)
Troponin I (High Sensitivity): 11 ng/L (ref <18)
Troponin I (High Sensitivity): 9 ng/L (ref <18)

## 2022-08-19 MED ORDER — FUROSEMIDE 10 MG/ML IJ SOLN
40.0000 mg | Freq: Once | INTRAMUSCULAR | Status: AC
  Administered 2022-08-19: 40 mg via INTRAVENOUS
  Filled 2022-08-19: qty 4

## 2022-08-19 MED ORDER — MELATONIN 5 MG PO TABS
5.0000 mg | ORAL_TABLET | Freq: Every evening | ORAL | Status: DC | PRN
  Administered 2022-08-20 – 2022-08-21 (×2): 5 mg via ORAL
  Filled 2022-08-19 (×2): qty 1

## 2022-08-19 MED ORDER — FENTANYL CITRATE PF 50 MCG/ML IJ SOSY
50.0000 ug | PREFILLED_SYRINGE | Freq: Once | INTRAMUSCULAR | Status: AC
  Administered 2022-08-19: 50 ug via INTRAVENOUS
  Filled 2022-08-19: qty 1

## 2022-08-19 MED ORDER — ATORVASTATIN CALCIUM 10 MG PO TABS
10.0000 mg | ORAL_TABLET | Freq: Every day | ORAL | Status: DC
  Administered 2022-08-20 – 2022-08-21 (×3): 10 mg via ORAL
  Filled 2022-08-19 (×3): qty 1

## 2022-08-19 MED ORDER — METOPROLOL SUCCINATE ER 50 MG PO TB24
50.0000 mg | ORAL_TABLET | Freq: Every day | ORAL | Status: DC
  Administered 2022-08-20 – 2022-08-22 (×3): 50 mg via ORAL
  Filled 2022-08-19 (×3): qty 1

## 2022-08-19 MED ORDER — ONDANSETRON HCL 4 MG/2ML IJ SOLN: 4.0000 mg | Freq: Four times a day (QID) | INTRAMUSCULAR | Status: DC | PRN

## 2022-08-19 MED ORDER — ONDANSETRON HCL 4 MG PO TABS: 4.0000 mg | ORAL_TABLET | Freq: Four times a day (QID) | ORAL | Status: DC | PRN

## 2022-08-19 MED ORDER — FAMOTIDINE 20 MG PO TABS: 20.0000 mg | ORAL_TABLET | Freq: Every day | ORAL | Status: DC | PRN

## 2022-08-19 MED ORDER — SENNOSIDES-DOCUSATE SODIUM 8.6-50 MG PO TABS: 1.0000 | ORAL_TABLET | Freq: Every evening | ORAL | Status: DC | PRN

## 2022-08-19 MED ORDER — ACETAMINOPHEN 325 MG PO TABS
650.0000 mg | ORAL_TABLET | Freq: Four times a day (QID) | ORAL | Status: DC | PRN
  Filled 2022-08-19 (×2): qty 2

## 2022-08-19 MED ORDER — ACETAMINOPHEN 650 MG RE SUPP: 650.0000 mg | Freq: Four times a day (QID) | RECTAL | Status: DC | PRN

## 2022-08-19 MED ORDER — SODIUM CHLORIDE 0.9% FLUSH
3.0000 mL | Freq: Two times a day (BID) | INTRAVENOUS | Status: DC
  Administered 2022-08-20 – 2022-08-21 (×4): 3 mL via INTRAVENOUS

## 2022-08-19 MED ORDER — LIDOCAINE 5 % EX PTCH
1.0000 | MEDICATED_PATCH | CUTANEOUS | Status: DC
  Administered 2022-08-19 – 2022-08-21 (×3): 1 via TRANSDERMAL
  Filled 2022-08-19 (×3): qty 1

## 2022-08-19 MED ORDER — IOHEXOL 350 MG/ML SOLN
55.0000 mL | Freq: Once | INTRAVENOUS | Status: AC | PRN
  Administered 2022-08-19: 55 mL via INTRAVENOUS

## 2022-08-19 NOTE — Telephone Encounter (Signed)
Patient is requesting call back in regards to results.  

## 2022-08-19 NOTE — ED Notes (Signed)
Patient transported to CT 

## 2022-08-19 NOTE — ED Notes (Signed)
ED TO INPATIENT HANDOFF REPORT  ED Nurse Name and Phone #: [phone]  S Name/Age/Gender Nicole Graves 77 y.o. female Room/Bed: 016C/016C  Code Status   Code Status: Full Code  Home/SNF/Other Home Patient oriented to: self, place, time, and situation Is this baseline? Yes   Triage Complete: Triage complete  Chief Complaint Pleural effusion, bilateral [J90]  Triage Note Pt came in via POV d/t heaviness in her chest when she breaths that has progressively gotten worse since she fell 2 times 2 weeks ago. States that she fell on her Rt side, did not hit her head but is hurting under her Rt breast & in her arm & states her voice sounds different caused by the way she is breathing (per pt). Does report Hx of cardiac problems but deniers her CP to be cardiac related.    Allergies Allergies  Allergen Reactions   Sulfa Antibiotics Nausea And Vomiting and Other (See Comments)   Ativan [Lorazepam] Other (See Comments)    Hyperactive    Tizanidine Hcl Other (See Comments)    Pt does not remember     Level of Care/Admitting Diagnosis ED Disposition     ED Disposition  Admit   Condition  --   Comment  Hospital Area: [facility] [[id number]]  Level of Care: Telemetry Cardiac [[id number]]  May admit patient to [facility] or [facility] if equivalent level of care is available:: No  Covid Evaluation: Asymptomatic - no recent exposure (last 10 days) testing not required  Diagnosis: Pleural effusion, bilateral [[id number]]  Admitting Physician: Charlsie Quest [[id number]]  Attending Physician: Charlsie Quest [[id number]]  Certification:: I certify this patient will need inpatient services for at least 2 midnights  Estimated Length of Stay: 2          B Medical/Surgery History Past Medical History:  Diagnosis Date   Anemia    Anxiety    Arthritis    Knees, neck, back   Chronic lower back pain    with siatica   Colon polyp 1999   Depression    GERD (gastroesophageal reflux  disease)    HTN (hypertension)    Hyperlipidemia    Mild aortic insufficiency    Mild mitral regurgitation    Orthostatic hypotension    PAT (paroxysmal atrial tachycardia)    PONV (postoperative nausea and vomiting)    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 Lynnette Caffey and Dr. Laneta Simmers   Severe aortic stenosis    Stage 3b chronic kidney disease (CKD) (HCC)    SVT (supraventricular tachycardia)    TR (tricuspid regurgitation) 09/19/2016   mild by echo 09/2021   Past Surgical History:  Procedure Laterality Date   CESAREAN SECTION  1964   X2   CHOLECYSTECTOMY     COSMETIC SURGERY  1980   removal extra fat inner thighs   EYE SURGERY Bilateral     Cataract extraction with IOL   INTRAOPERATIVE TRANSTHORACIC ECHOCARDIOGRAM N/A 03/16/2021   Procedure: INTRAOPERATIVE TRANSTHORACIC ECHOCARDIOGRAM;  Surgeon: Orbie Pyo, MD;  Location: [facility] OR;  Service: Open Heart Surgery;  Laterality: N/A;   RIGHT HEART CATH AND CORONARY ANGIOGRAPHY N/A 02/17/2021   Procedure: RIGHT HEART CATH AND CORONARY ANGIOGRAPHY;  Surgeon: Orbie Pyo, MD;  Location: [facility] INVASIVE CV LAB;  Service: Cardiovascular;  Laterality: N/A;   SVT ABLATION N/A 06/27/2022   Procedure: SVT ABLATION;  Surgeon:  Marinus Maw, MD;  Location: [facility] INVASIVE CV LAB;  Service: Cardiovascular;  Laterality: N/A;   TOTAL KNEE ARTHROPLASTY Right 11/30/2015   Procedure: RIGHT TOTAL KNEE ARTHROPLASTY;  Surgeon: Ollen Gross, MD;  Location: [facility] ORS;  Service: Orthopedics;  Laterality: Right;   TOTAL KNEE ARTHROPLASTY Left 11/14/2016   Procedure: LEFT TOTAL KNEE ARTHROPLASTY;  Surgeon: Ollen Gross, MD;  Location: [facility] ORS;  Service: Orthopedics;  Laterality: Left;   TRANSCATHETER AORTIC VALVE REPLACEMENT, TRANSFEMORAL Right 03/16/2021   Procedure: TRANSCATHETER AORTIC VALVE REPLACEMENT, TRANSFEMORAL;  Surgeon: Orbie Pyo, MD;  Location: [facility] OR;   Service: Open Heart Surgery;  Laterality: Right;     A IV Location/Drains/Wounds Patient Lines/Drains/Airways Status     Active Line/Drains/Airways     Name Placement date Placement time Site Days   Peripheral IV 08/19/22 20 G Left Antecubital 08/19/22  1901  Antecubital  less than 1            Intake/Output Last 24 hours No intake or output data in the 24 hours ending 08/19/22 2227  Labs/Imaging Results for orders placed or performed during the hospital encounter of 08/19/22 (from the past 48 hour(s))  Basic metabolic panel     Status: Abnormal   Collection Time: 08/19/22  4:47 PM  Result Value Ref Range   Sodium 136 135 - 145 mmol/L   Potassium 4.4 3.5 - 5.1 mmol/L   Chloride 100 98 - 111 mmol/L   CO2 24 22 - 32 mmol/L   Glucose, Bld 113 (H) 70 - 99 mg/dL    Comment: Glucose reference range applies only to samples taken after fasting for at least 8 hours.   BUN 18 8 - 23 mg/dL   Creatinine, Ser 4.09 (H) 0.44 - 1.00 mg/dL   Calcium 9.1 8.9 - 81.1 mg/dL   GFR, Estimated 51 (L) >60 mL/min    Comment: (NOTE) Calculated using the CKD-EPI Creatinine Equation (2021)    Anion gap 12 5 - 15    Comment: Performed at [facility], [address], [place], [place] [address]  CBC     Status: Abnormal   Collection Time: 08/19/22  4:47 PM  Result Value Ref Range   WBC 15.0 (H) 4.0 - 10.5 K/uL   RBC 3.15 (L) 3.87 - 5.11 MIL/uL   Hemoglobin 9.1 (L) 12.0 - 15.0 g/dL   HCT 29.5 (L) 62.1 - 30.8 %   MCV 96.2 80.0 - 100.0 fL   MCH 28.9 26.0 - 34.0 pg   MCHC 30.0 30.0 - 36.0 g/dL   RDW 65.7 84.6 - 96.2 %   Platelets 523 (H) 150 - 400 K/uL   nRBC 0.0 0.0 - 0.2 %    Comment: Performed at [facility], [address] [place], [place] [address]  Troponin I (High Sensitivity)     Status: None   Collection Time: 08/19/22  4:47 PM  Result Value Ref Range   Troponin I (High Sensitivity) 11 <18 ng/L    Comment: (NOTE) Elevated high sensitivity troponin I (hsTnI) values and  significant  changes across serial measurements may suggest ACS but many other  chronic and acute conditions are known to elevate hsTnI results.  Refer to the "Links" section for chest pain algorithms and additional  guidance. Performed at [facility], [address] [place], [place] [address]   Troponin I (High Sensitivity)     Status: None   Collection Time: 08/19/22  6:38 PM  Result Value  Ref Range   Troponin I (High Sensitivity) 9 <18 ng/L    Comment: (NOTE) Elevated high sensitivity troponin I (hsTnI) values and significant  changes across serial measurements may suggest ACS but many other  chronic and acute conditions are known to elevate hsTnI results.  Refer to the "Links" section for chest pain algorithms and additional  guidance. Performed at [facility], [address], [place], [place] [address]    CT Angio Chest PE W and/or Wo Contrast  Result Date: 08/19/2022 CLINICAL DATA:  Trauma bilateral effusions w/ unclear etiology. Shortness of breath, chest pain. Fall 2 weeks ago. EXAM: CT ANGIOGRAPHY CHEST WITH CONTRAST TECHNIQUE: Multidetector CT imaging of the chest was performed using the standard protocol during bolus administration of intravenous contrast. Multiplanar CT image reconstructions and MIPs were obtained to evaluate the vascular anatomy. RADIATION DOSE REDUCTION: This exam was performed according to the departmental dose-optimization program which includes automated exposure control, adjustment of the mA and/or kV according to patient size and/or use of iterative reconstruction technique. CONTRAST:  55mL OMNIPAQUE IOHEXOL 350 MG/ML SOLN COMPARISON:  03/02/2021 FINDINGS: Cardiovascular: No filling defects in the pulmonary arteries to suggest pulmonary emboli. Mild cardiomegaly. Prior TAVR. Scattered coronary artery and aortic calcifications. No aneurysm. Mediastinum/Nodes: No mediastinal, hilar, or axillary adenopathy. Trachea and esophagus are unremarkable.  Thyroid unremarkable. Lungs/Pleura: Large bilateral pleural effusions. Compressive atelectasis in the lower lobes. Small pericardial effusion. Upper Abdomen: Large hiatal hernia.  No acute findings. Musculoskeletal: Chest wall soft tissues are unremarkable. No acute bony abnormality. Review of the MIP images confirms the above findings. IMPRESSION: No evidence of pulmonary embolus. Large bilateral pleural effusions with compressive atelectasis in the lower lobes. Large hiatal hernia. Scattered coronary artery disease. Aortic Atherosclerosis (ICD10-I70.0). Electronically Signed   By: Charlett Nose M.D.   On: 08/19/2022 20:13   DG Chest Portable 1 View  Result Date: 08/19/2022 CLINICAL DATA:  Chest pain, initial encounter EXAM: PORTABLE CHEST 1 VIEW COMPARISON:  08/16/2022 FINDINGS: Cardiac shadow is stable. TAVR is again noted. Small effusions are again seen right greater than left. Mild bibasilar atelectatic changes are seen. IMPRESSION: Small effusions and bibasilar atelectasis. Electronically Signed   By: Alcide Clever M.D.   On: 08/19/2022 19:27   DG Elbow 2 Views Right  Result Date: 08/19/2022 CLINICAL DATA:  Recent fall with right elbow pain, initial encounter EXAM: RIGHT ELBOW - 2 VIEW COMPARISON:  None Available. FINDINGS: There is no evidence of fracture, dislocation, or joint effusion. There is no evidence of arthropathy or other focal bone abnormality. Soft tissues are unremarkable. IMPRESSION: No acute abnormality noted. Electronically Signed   By: Alcide Clever M.D.   On: 08/19/2022 19:26   DG Shoulder Right  Result Date: 08/19/2022 CLINICAL DATA:  Recent fall with right shoulder pain, initial encounter EXAM: RIGHT SHOULDER - 2+ VIEW COMPARISON:  None Available. FINDINGS: Degenerative changes of the acromioclavicular joint are seen. Humeral head is high-riding related to underlying rotator cuff injury. Right-sided pleural effusion is noted. No other focal abnormality is seen. IMPRESSION:  Degenerative changes of the right shoulder joint. Right pleural effusion. Electronically Signed   By: Alcide Clever M.D.   On: 08/19/2022 19:25   DG Humerus Right  Result Date: 08/19/2022 CLINICAL DATA:  Recent fall with right arm pain, initial encounter EXAM: RIGHT HUMERUS - 2+ VIEW COMPARISON:  None Available. FINDINGS: Humeral head is high-riding likely related to chronic rotator cuff injury. No acute fracture or dislocation is noted. No soft tissue abnormality  is seen IMPRESSION: Chronic changes without acute abnormality. Electronically Signed   By: Alcide Clever M.D.   On: 08/19/2022 19:24    Pending Labs Unresulted Labs (From admission, onward)     Start     Ordered   08/20/22 0500  Magnesium  Tomorrow morning,   R        08/19/22 2141   08/20/22 0500  TSH  Tomorrow morning,   R        08/19/22 2141   08/20/22 0500  CBC  Tomorrow morning,   R        08/19/22 2141   08/20/22 0500  Comprehensive metabolic panel  Tomorrow morning,   R        08/19/22 2141   08/19/22 1940  Brain natriuretic peptide  Once,   URGENT        08/19/22 1940            Vitals/Pain Today's Vitals   08/19/22 2100 08/19/22 2130 08/19/22 2200 08/19/22 2215  BP: 103/69 114/61 112/60   Pulse: 93 93 92 95  Resp: (!) 25 (!) 27 (!) 22 16  Temp:      TempSrc:      SpO2: 96% 96% 94% 92%  PainSc:        Isolation Precautions No active isolations  Medications Medications  lidocaine (LIDODERM) 5 % 1 patch (1 patch Transdermal Patch Applied 08/19/22 2008)  sodium chloride flush (NS) 0.9 % injection 3 mL (3 mLs Intravenous Not Given 08/19/22 2153)  acetaminophen (TYLENOL) tablet 650 mg (has no administration in time range)    Or  acetaminophen (TYLENOL) suppository 650 mg (has no administration in time range)  ondansetron (ZOFRAN) tablet 4 mg (has no administration in time range)    Or  ondansetron (ZOFRAN) injection 4 mg (has no administration in time range)  senna-docusate (Senokot-S) tablet 1 tablet (has  no administration in time range)  fentaNYL (SUBLIMAZE) injection 50 mcg (50 mcg Intravenous Given 08/19/22 1926)  iohexol (OMNIPAQUE) 350 MG/ML injection 55 mL (55 mLs Intravenous Contrast Given 08/19/22 2007)  furosemide (LASIX) injection 40 mg (40 mg Intravenous Given 08/19/22 2029)    Mobility walks with person assist     Focused Assessments     R Recommendations: See Admitting Provider Note  Report given to:   Additional Notes: pt from home with daughter / [place] / gets [facility] when ambulating, on RA currently / a&o /

## 2022-08-19 NOTE — ED Provider Notes (Signed)
[place] EMERGENCY DEPARTMENT AT [facility] Provider Note   CSN: [id number] Arrival date & time: 08/19/22  1616     History  Chief Complaint  Patient presents with   Shortness of Breath   Chest Pain    Nicole Graves is a 77 y.o. female.  77 yof hx of SVT, anemia, anxiety, arthritis, chronic low back pain, GERD, HTN, paroxysmal atrial tachycardia, CKD stage III, here for right chest wall pain and sob for the last 2 weeks. She fell approximately 2 weeks ago and landed on her right side. She denies LOC. States she was on the ground for about 2 hours before family came home. She has been having right sided chest wall pain, right upper arm pain since the fall.  She was seen at her cardiologist for follow-up, who ordered a chest x-ray which has not been resulted.  Came today because she is continuing to have pain, difficulty taking deep breath, and right arm pain.  She reports that because she is unable to take a deep breath and her breathing is limited by pain, she is having difficulty with speaking complete sentences.  Denies any other complaint at this time, including abdominal pain, headache, nausea or vomiting, any recent fevers or chills or productive cough.  The history is provided by a relative.  Shortness of Breath Associated symptoms: chest pain   Associated symptoms: no fever, no neck pain and no vomiting   Chest Pain Associated symptoms: shortness of breath   Associated symptoms: no fever, no nausea and no vomiting        Home Medications Prior to Admission medications   Medication Sig Start Date End Date Taking? Authorizing Provider  acetaminophen (TYLENOL) 650 MG CR tablet Take 1,300 mg by mouth in the morning and at bedtime.    [provider]  alendronate (FOSAMAX) 70 MG tablet Take 1 tablet by mouth every Thursday.    [provider]  amoxicillin (AMOXIL) 500 MG tablet Take by mouth. TAKE 4 TABLETS BY MOUTH PRIOR TO DENTAL APPOINTMENTS     [provider]  aspirin EC 81 MG tablet Take 81 mg by mouth daily. Swallow whole.    [provider]  atorvastatin (LIPITOR) 10 MG tablet Take 10 mg by mouth at bedtime.  02/11/15   [provider]  buPROPion (WELLBUTRIN XL) 300 MG 24 hr tablet Take 300 mg by mouth daily after breakfast.    [provider]  cyclobenzaprine (FLEXERIL) 10 MG tablet Take 1 tablet (10 mg total) by mouth at bedtime. 04/14/22   Medina-Vargas, Monina C, NP  DULoxetine (CYMBALTA) 30 MG capsule Take 90 mg by mouth daily after breakfast.    [provider]  famotidine (PEPCID) 20 MG tablet Take 20 mg by mouth daily as needed for heartburn.    [provider]  ferrous sulfate 325 (65 FE) MG tablet Take 325 mg by mouth daily.    [provider]  gabapentin (NEURONTIN) 300 MG capsule Take 300 mg by mouth 3 (three) times daily. 12/20/19   [provider]  lidocaine (LIDODERM) 5 % Place 1 patch onto the skin daily. Remove & Discard patch within 12 hours or as directed by MD. Apply to site of pain Patient taking differently: Place 1 patch onto the skin daily as needed (pain). Remove & Discard patch within 12 hours or as directed by MD. Apply to site of pain 09/27/21   Pokhrel, Rebekah Chesterfield, MD  Melatonin 5 MG CAPS Take  1 capsule by mouth at bedtime.    [provider]  Menthol, Topical Analgesic, (BIOFREEZE EX) Apply 1 Application topically daily as needed (pain).    [provider]  metoprolol succinate (TOPROL-XL) 50 MG 24 hr tablet Take 1 tablet (50 mg total) by mouth daily. Take with or immediately following a meal. You may take an extra 50 mg dose if you experience palpitations. 04/20/22   Meriam Sprague, MD      Allergies    Sulfa antibiotics, Ativan [lorazepam], and Tizanidine hcl    Review of Systems   Review of Systems  Constitutional:  Negative for chills and fever.  Respiratory:  Positive for shortness of breath.   Cardiovascular:   Positive for chest pain.  Gastrointestinal:  Negative for nausea and vomiting.  Musculoskeletal:  Negative for neck pain.  Neurological:  Negative for syncope and light-headedness.  All other systems reviewed and are negative.   Physical Exam Updated Vital Signs BP 133/71 (BP Location: Right Arm)   Pulse 94   Temp 97.9 F (36.6 C) (Oral)   Resp 20   SpO2 95%  Physical Exam Vitals and nursing note reviewed.  Constitutional:      General: She is not in acute distress.    Appearance: She is well-developed.  HENT:     Head: Normocephalic and atraumatic.  Eyes:     Extraocular Movements: Extraocular movements intact.     Conjunctiva/sclera: Conjunctivae normal.     Pupils: Pupils are equal, round, and reactive to light.  Cardiovascular:     Rate and Rhythm: Normal rate and regular rhythm.     Pulses:          Radial pulses are 2+ on the right side and 2+ on the left side.       Dorsalis pedis pulses are 2+ on the right side and 2+ on the left side.     Heart sounds: No murmur heard. Pulmonary:     Effort: Pulmonary effort is normal. Tachypnea present.     Breath sounds: No decreased breath sounds, wheezing, rhonchi or rales.     Comments: Difficulty with complete sentences.  Low volume breaths. Chest:     Chest wall: Tenderness present. No deformity.  Abdominal:     Palpations: Abdomen is soft.     Tenderness: There is no abdominal tenderness.  Musculoskeletal:        General: No swelling.     Right shoulder: Tenderness present. Decreased range of motion.     Left shoulder: Decreased range of motion (Baseline).     Right upper arm: Tenderness and bony tenderness present.     Right elbow: Tenderness present.     Right forearm: No deformity.     Cervical back: Neck supple.     Right lower leg: No edema.     Left lower leg: No edema.  Skin:    General: Skin is warm and dry.     Capillary Refill: Capillary refill takes less than 2 seconds.  Neurological:     General: No  focal deficit present.     Mental Status: She is alert and oriented to person, place, and time.  Psychiatric:        Mood and Affect: Mood normal.     ED Results / Procedures / Treatments   Labs (all labs ordered are listed, but only abnormal results are displayed) Labs Reviewed  BASIC METABOLIC PANEL - Abnormal; Notable for the following components:  Result Value   Glucose, Bld 113 (*)    Creatinine, Ser 1.11 (*)    GFR, Estimated 51 (*)    All other components within normal limits  CBC - Abnormal; Notable for the following components:   WBC 15.0 (*)    RBC 3.15 (*)    Hemoglobin 9.1 (*)    HCT 30.3 (*)    Platelets 523 (*)    All other components within normal limits  TROPONIN I (HIGH SENSITIVITY)  TROPONIN I (HIGH SENSITIVITY)    EKG EKG Interpretation Date/Time:  Friday August 19 2022 16:37:41 EDT Ventricular Rate:  94 PR Interval:  140 QRS Duration:  84 QT Interval:  302 QTC Calculation: 377 R Axis:   51  Text Interpretation: Normal sinus rhythm Nonspecific T wave abnormality Abnormal ECG When compared with ECG of 16-Aug-2022 13:26, PREVIOUS ECG IS PRESENT Confirmed by Virgina Norfolk [phone]) on 08/19/2022 6:32:59 PM  Radiology No results found.  Procedures Procedures    Medications Ordered in ED Medications - No data to display  ED Course/ Medical Decision Making/ A&P                             Medical Decision Making 77 year old female from standing approximately 2 weeks ago and since then has been having right-sided chest pain that has limited her ability to take deep breaths.  Concern for traumatic injury including rib fracture.  Additional concern for possibility of effusion causing pleurisy vs pneumonia.  Will plan for imaging, labs.  Serial reevaluation.  Suspect likely admission pending workup.  Labs without significant abnormality that would contribute to current presentation. Imaging shows bilateral pleural effusions. CTA of the chest shows  large volume bilateral pleural effusions.  Given her new symptoms, discussed case with pulmonology who agreed to see the patient tomorrow morning, but requested to be reconsulted.  Hospitalist paged who graciously agreed to admit the patient for further workup.  Amount and/or Complexity of Data Reviewed Labs: ordered. Radiology: ordered.  Risk Prescription drug management. Decision regarding hospitalization.          Final Clinical Impression(s) / ED Diagnoses Final diagnoses:  None    Rx / DC Orders ED Discharge Orders     None         Fayrene Helper, MD 08/19/22 2308    Virgina Norfolk, DO 08/19/22 2326

## 2022-08-19 NOTE — ED Triage Notes (Signed)
Pt came in via POV d/t heaviness in her chest when she breaths that has progressively gotten worse since she fell 2 times 2 weeks ago. States that she fell on her Rt side, did not hit her head but is hurting under her Rt breast & in her arm & states her voice sounds different caused by the way she is breathing (per pt). Does report Hx of cardiac problems but deniers her CP to be cardiac related.

## 2022-08-19 NOTE — ED Provider Notes (Signed)
Supervised resident visit.  Patient here with chest pain shortness of breath.  Said symptoms started after a fall that hit the right side of her ribs a few weeks ago.  History of TAVR, she is not on blood thinners or diuretics.  She has increased work of breathing on exam, tenderness to the right side of the ribs.  Lab work was initiated to evaluate for any electrolyte abnormality, ACS, anemia.  Chest x-ray showed effusions.  CT scan was obtained to further evaluate for pneumonia, rib fractures, PE and overall showed large bilateral pleural effusions with atelectasis compressively.  No pneumonia.  No PE.  Troponins negative.  No signs of leg swelling.  Overall not sure if there is a cardiac process.  She had a recent ablation and TAVR 2 years ago.  Think she needs echocardiogram.  Will give Lasix.  Talked with pulmonology team to maybe evaluate her tomorrow for possible thoracentesis.  Overall to be admitted to medicine for further care.  This chart was dictated using voice recognition software.  Despite best efforts to proofread,  errors can occur which can change the documentation meaning.    Virgina Norfolk, DO 08/19/22 2051

## 2022-08-19 NOTE — H&P (Signed)
History and Physical    Nicole Graves ZOX:[id number] DOB: 1945-03-06 DOA: 08/19/2022  PCP: [name], MD  Patient coming from: Home  I have personally briefly reviewed patient's old medical records in [facility]  Chief Complaint: Shortness of breath  HPI: Nicole Graves is a 77 y.o. female with medical history significant for SVT s/p ablation (06/27/2022), severe AS s/p TAVR 2023, CKD stage IIIa, HLD, depression/anxiety who presented to the ED for evaluation of shortness of breath.  Patient states that she had a mechanical fall 2 weeks ago.  She fell onto her right side.  Since then she has been having right-sided rib pain.  She has been having some progressive shortness of breath with exertion, worse in the last couple days.  She has had a nonproductive cough.  She has not seen any swelling in her lower extremities.  She reports good urine output without dysuria.  She denies fevers, chills, diaphoresis, chest pain, abdominal pain.  She also notes the last few days having frequent perioral facial movement.  She denies any recent medication changes.  ED Course  Labs/Imaging on admission: I have personally reviewed following labs and imaging studies.  Initial vitals showed BP 122/68, pulse 94, RR 20, temp 99.1 F, SpO2 98% on room air.  Labs show WBC 15.0, hemoglobin 9.1, platelets 523,000, sodium 136, potassium 4.4, bicarb 24, BUN 18, creatinine 1.11, serum glucose 113, troponin negative x 2.  CTA chest negative for PE.  Large bilateral pleural effusions with compressive atelectasis in the lower lobes noted.  Large hiatal hernia and scattered CAD seen.  X-ray right shoulder humerus and elbow negative for acute abnormalities.  Patient was given IV Lasix 40 mg.  EDP discussed with on-call pulmonology who recommended medical admission and they will see in consultation.  The hospitalist service was consulted to admit for further evaluation and management.  Review of Systems: All systems  reviewed and are negative except as documented in history of present illness above.   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 Lynnette Caffey and Dr. Laneta Simmers   Severe aortic stenosis    Stage 3b chronic kidney disease (CKD) (HCC)    SVT (supraventricular tachycardia)    TR (tricuspid regurgitation) 09/19/2016   mild by echo 09/2021    Past Surgical History:  Procedure Laterality Date   CESAREAN SECTION  1964   X2   CHOLECYSTECTOMY     COSMETIC SURGERY  1980   removal extra fat inner thighs   EYE SURGERY Bilateral     Cataract extraction with IOL   INTRAOPERATIVE TRANSTHORACIC ECHOCARDIOGRAM N/A 03/16/2021   Procedure: INTRAOPERATIVE TRANSTHORACIC ECHOCARDIOGRAM;  Surgeon: Orbie Pyo, MD;  Location: [facility] OR;  Service: Open Heart Surgery;  Laterality: N/A;   RIGHT HEART CATH AND CORONARY ANGIOGRAPHY N/A 02/17/2021   Procedure: RIGHT HEART CATH AND CORONARY ANGIOGRAPHY;  Surgeon: Orbie Pyo, MD;  Location: [facility] INVASIVE CV LAB;  Service: Cardiovascular;  Laterality: N/A;   SVT ABLATION N/A 06/27/2022   Procedure: SVT ABLATION;  Surgeon: Marinus Maw, MD;  Location: [facility] INVASIVE CV LAB;  Service: Cardiovascular;  Laterality: N/A;   TOTAL KNEE ARTHROPLASTY Right 11/30/2015   Procedure: RIGHT TOTAL KNEE ARTHROPLASTY;  Surgeon: Ollen Gross, MD;  Location: [facility] ORS;  Service: Orthopedics;  Laterality: Right;   TOTAL KNEE ARTHROPLASTY Left 11/14/2016   Procedure: LEFT TOTAL KNEE  ARTHROPLASTY;  Surgeon: Ollen Gross, MD;  Location: [facility] ORS;  Service: Orthopedics;  Laterality: Left;   TRANSCATHETER AORTIC VALVE REPLACEMENT, TRANSFEMORAL Right 03/16/2021   Procedure: TRANSCATHETER AORTIC VALVE REPLACEMENT, TRANSFEMORAL;  Surgeon: Orbie Pyo, MD;  Location: [facility] OR;  Service: Open Heart Surgery;  Laterality: Right;    Social History:  reports that she has never smoked. She has never used smokeless tobacco. She reports that she does not drink alcohol and does not use drugs.  Allergies  Allergen Reactions   Sulfa Antibiotics Nausea And Vomiting and Other (See Comments)   Ativan [Lorazepam] Other (See Comments)    Hyperactive    Tizanidine Hcl Other (See Comments)    Pt does not remember     Family History  Problem Relation Age of Onset   Colon cancer Mother    Heart attack Brother    Hypertension Brother    Stroke Neg Hx      Prior to Admission medications   Medication Sig Start Date End Date Taking? Authorizing Provider  acetaminophen (TYLENOL) 650 MG CR tablet Take 1,300 mg by mouth in the morning and at bedtime.    [provider]  alendronate (FOSAMAX) 70 MG tablet Take 1 tablet by mouth every Thursday.    [provider]  amoxicillin (AMOXIL) 500 MG tablet Take by mouth. TAKE 4 TABLETS BY MOUTH PRIOR TO DENTAL APPOINTMENTS    [provider]  aspirin EC 81 MG tablet Take 81 mg by mouth daily. Swallow whole.    [provider]  atorvastatin (LIPITOR) 10 MG tablet Take 10 mg by mouth at bedtime.  02/11/15   [provider]  buPROPion (WELLBUTRIN XL) 300 MG 24 hr tablet Take 300 mg by mouth daily after breakfast.    [provider]  cyclobenzaprine (FLEXERIL) 10 MG tablet Take 1 tablet (10 mg total) by mouth at bedtime. 04/14/22   Medina-Vargas, Monina C, NP  DULoxetine (CYMBALTA) 30 MG capsule Take 90 mg by mouth daily after breakfast.    [provider]  famotidine (PEPCID) 20 MG tablet Take  20 mg by mouth daily as needed for heartburn.    [provider]  ferrous sulfate 325 (65 FE) MG tablet Take 325 mg by mouth daily.    [provider]  gabapentin (NEURONTIN) 300 MG capsule Take 300 mg by mouth 3 (three) times daily. 12/20/19   [provider]  lidocaine (LIDODERM) 5 % Place 1 patch onto the skin daily. Remove & Discard patch within 12 hours or as directed by MD. Apply to site of pain Patient taking differently: Place 1 patch onto the skin daily as needed (pain). Remove & Discard patch within 12 hours or as directed by MD. Apply to site of pain 09/27/21   Pokhrel, Rebekah Chesterfield, MD  Melatonin 5 MG CAPS Take 1 capsule by mouth at bedtime.    [provider]  Menthol, Topical Analgesic, (BIOFREEZE EX) Apply 1 Application topically daily as needed (  pain).    [provider]  metoprolol succinate (TOPROL-XL) 50 MG 24 hr tablet Take 1 tablet (50 mg total) by mouth daily. Take with or immediately following a meal. You may take an extra 50 mg dose if you experience palpitations. 04/20/22   Meriam Sprague, MD    Physical Exam: Vitals:   08/19/22 2100 08/19/22 2130 08/19/22 2200 08/19/22 2215  BP: 103/69 114/61 112/60   Pulse: 93 93 92 95  Resp: (!) 25 (!) 27 (!) 22 16  Temp:      TempSrc:      SpO2: 96% 96% 94% 92%   Constitutional: Resting in bed, NAD, calm, comfortable Eyes: EOMI, lids and conjunctivae normal ENMT: Facial dyskinesia type perioral movement Neck: normal, supple, no masses. Respiratory: clear to auscultation bilaterally, no wheezing, no crackles. Normal respiratory effort. No accessory muscle use.  Cardiovascular: Regular rate and rhythm, no murmurs / rubs / gallops. No extremity edema. 2+ pedal pulses. Abdomen: no tenderness, no masses palpated. No hepatosplenomegaly. Bowel sounds positive.  Musculoskeletal: no clubbing / cyanosis. No joint deformity upper and lower extremities. Good ROM, no contractures. Normal muscle tone.   Skin: no rashes, lesions, ulcers. No induration Neurologic: CN 2-12 grossly intact. Sensation intact. Strength 5/5 in all 4.  Psychiatric: Normal judgment and insight. Alert and oriented x 3. Normal mood.   EKG: Personally reviewed. Sinus rhythm, rate 94, no acute ischemic changes.  Assessment/Plan Principal Problem:   Pleural effusion, bilateral Active Problems:   S/P TAVR (transcatheter aortic valve replacement)   Chronic kidney disease, stage 3a (HCC)   Normocytic anemia   Depression with anxiety   Nicole Graves is a 77 y.o. female with medical history significant for SVT s/p ablation (06/27/2022), severe AS s/p TAVR 2023, CKD stage IIIa, HLD, depression/anxiety who is admitted with large bilateral pleural effusions.  Assessment and Plan: Large bilateral pleural effusions: New finding without obvious etiology.  Patient reports mechanical fall 2 weeks ago without clear injury.  History of TAVR and recent SVT ablation.  TTE 03/18/2022 showed EF 60-65%, G1DD.  Leukocytosis noted with WBC 15.  Patient is afebrile. -PCCM to consult for possible therapeutic/diagnostic thoracentesis, appreciate assistance -Update echocardiogram -S/p IV Lasix 40 mg -Supplement O2 as needed, continue IS  SVT s/p ablation 06/27/2022: Remains in sinus rhythm.  Follows with EP Dr. Ladona Ridgel.  Resume Toprol-XL.  AS s/p TAVR 2023: Updating echocardiogram as above.  Facial dyskinesia: Perioral facial movement noted on admission.  Patient states this began 2 days ago.  No obvious etiology.  No expressive or receptive aphasia.  Will hold bupropion, Flexeril, Cymbalta at this time.  CKD stage IIIa: Renal function stable.  Normocytic anemia: Hemoglobin 9.1 decreased from previous 11.7 on 4/30.  She denies obvious bleeding.  Hyperlipidemia: Continue atorvastatin.  Depression/anxiety: Holding home meds as above.   DVT prophylaxis: SCDs Start: 08/19/22 2141 Code Status: Full code, confirmed with patient on  admission Family Communication: Discussed with patient, she has discussed with family Disposition Plan: From home and likely discharge to home pending clinical progress Consults called: PCCM Severity of Illness: The appropriate patient status for this patient is INPATIENT. Inpatient status is judged to be reasonable and necessary in order to provide the required intensity of service to ensure the patient's safety. The patient's presenting symptoms, physical exam findings, and initial radiographic and laboratory data in the context of their chronic comorbidities is felt to place them at high risk for further clinical deterioration. Furthermore, it is not anticipated  that the patient will be medically stable for discharge from the hospital within 2 midnights of admission.   * I certify that at the point of admission it is my clinical judgment that the patient will require inpatient hospital care spanning beyond 2 midnights from the point of admission due to high intensity of service, high risk for further deterioration and high frequency of surveillance required.Darreld Mclean MD [facility]  If 7PM-7AM, please contact night-coverage [email or web address]  08/19/2022, 10:32 PM

## 2022-08-19 NOTE — Hospital Course (Signed)
Nicole Graves is a 77 y.o. female with medical history significant for SVT s/p ablation (06/27/2022), severe AS s/p TAVR 2023, CKD stage IIIa, HLD, depression/anxiety who is admitted with large bilateral pleural effusions.

## 2022-08-19 NOTE — Telephone Encounter (Signed)
Called pt to f/u.  Reports is currently in the ED for difficulty breathing r/t fall.   Was calling to get results of 08/16/22 CXR.  Advised that MD has not resulted testing.  Will send message as a reminder to MD.

## 2022-08-19 NOTE — ED Notes (Signed)
Family at bedside. 

## 2022-08-19 NOTE — Consult Note (Signed)
NAME:  Nicole Graves, MRN:  [id number], DOB:  Dec 11, 1945, LOS: 0 ADMISSION DATE:  08/19/2022, CONSULTATION DATE:  08/19/22 REFERRING MD:  Virgina Norfolk, DO CHIEF COMPLAINT:  Bilateral pleural effusions   History of Present Illness:  77 year old female with severe AS s/p TAVR, chronic diastolic heart failure, Hx SVT s/p ablation, CKD stage II, anemia. She reports mechanical fall 1-2 weeks ago, hitting her right side of her ribs and arm.  Has had worsening shortness of breath associated with occasional nonproductive cough. Denies fevers, chills. Chest imaging including CTA neg for PE but demonstrated large bilateral pleural effusions with compressive atelectasis. Given lasix however due to dyspnea PCCM consulted by EDP for thoracentesis.  ED vitals stable. On room air with SpO2 91%. Labs significant for WBC 15. XR right shoulder and elbow neg for acute fracture. Admitted to [facility]  Pertinent  Medical History  As above  Significant Hospital Events: Including procedures, antibiotic start and stop dates in addition to other pertinent events   CTA 08/19/22 - no PE. Large bilateral pleural effusion with compressive atelectasis. Large hiatal hernia  Interim History / Subjective:  As above  Objective   Blood pressure 103/69, pulse 93, temperature 97.9 F (36.6 C), temperature source Oral, resp. rate (!) 25, SpO2 96 %.       No intake or output data in the 24 hours ending 08/19/22 2123 There were no vitals filed for this visit.  Physical Exam: General: Well-appearing, no acute distress HENT: [place], AT Eyes: EOMI, no scleral icterus Respiratory: Diminished breath sounds bilaterally Cardiovascular: RRR, -M/R/G, no JVD Extremities:-Edema,-tenderness Neuro: AAO x4, CNII-XII grossly intact Psych: Normal mood, normal affect   Resolved Hospital Problem list   N/A  Assessment & Plan:   Bilateral pleural effusions Recent mechanical fall Thoracentesis dark yellow with small fibrinous clots. No evidence  of hemothorax. Unclear etiology. Consider heart failure-related effusion with bilateral effusion. No recent respiratory infection. --S/p right thoracentesis with 650 cc removed --Post-procedure CXR ordered --CXR in am --F/u pleural studies   Pulmonary will continue to follow  Best Practice (right click and "Reselect all SmartList Selections" daily)   Diet/type: Regular consistency (see orders) DVT prophylaxis: other Per primary GI prophylaxis: N/A Lines: N/A Foley:  N/A Code Status:  full code Last date of multidisciplinary goals of care discussion [per primary]  Labs   CBC: Recent Labs  Lab 08/19/22 1647  WBC 15.0*  HGB 9.1*  HCT 30.3*  MCV 96.2  PLT 523*    Basic Metabolic Panel: Recent Labs  Lab 08/19/22 1647  NA 136  K 4.4  CL 100  CO2 24  GLUCOSE 113*  BUN 18  CREATININE 1.11*  CALCIUM 9.1   GFR: Estimated Creatinine Clearance: 42.1 mL/min (A) (by C-G formula based on SCr of 1.11 mg/dL (H)). Recent Labs  Lab 08/19/22 1647  WBC 15.0*    Liver Function Tests: No results for input(s): "AST", "ALT", "ALKPHOS", "BILITOT", "PROT", "ALBUMIN" in the last 168 hours. No results for input(s): "LIPASE", "AMYLASE" in the last 168 hours. No results for input(s): "AMMONIA" in the last 168 hours.  ABG    Component Value Date/Time   PHART 7.402 03/12/2021 1107   PCO2ART 41.5 03/12/2021 1107   PO2ART 98.0 03/12/2021 1107   HCO3 25.3 03/12/2021 1107   TCO2 26 03/16/2021 1041   O2SAT 97.6 03/12/2021 1107     Coagulation Profile: No results for input(s): "INR", "PROTIME" in the last 168 hours.  Cardiac Enzymes: No results for  input(s): "CKTOTAL", "CKMB", "CKMBINDEX", "TROPONINI" in the last 168 hours.  HbA1C: Hgb A1c MFr Bld  Date/Time Value Ref Range Status  09/23/2021 04:55 AM 4.9 4.8 - 5.6 % Final    Comment:    (NOTE) Pre diabetes:          5.7%-6.4%  Diabetes:              >6.4%  Glycemic control for   <7.0% adults with diabetes      CBG: No results for input(s): "GLUCAP" in the last 168 hours.  Review of Systems:   Review of Systems  Constitutional:  Negative for chills, diaphoresis, fever, malaise/fatigue and weight loss.  HENT:  Negative for congestion.   Respiratory:  Positive for cough and shortness of breath. Negative for hemoptysis, sputum production and wheezing.   Cardiovascular:  Negative for chest pain, palpitations and leg swelling.     Past Medical History:  She,  has a past medical history of Anemia, Anxiety, Arthritis, Chronic lower back pain, Colon polyp (1999), Depression, GERD (gastroesophageal reflux disease), HTN (hypertension), Hyperlipidemia, Mild aortic insufficiency, Mild mitral regurgitation, Orthostatic hypotension, PAT (paroxysmal atrial tachycardia), PONV (postoperative nausea and vomiting), S/P TAVR (transcatheter aortic valve replacement) (03/16/2021), Severe aortic stenosis, Stage 3b chronic kidney disease (CKD) (HCC), SVT (supraventricular tachycardia), and TR (tricuspid regurgitation) (09/19/2016).   Surgical History:   Past Surgical History:  Procedure Laterality Date   CESAREAN SECTION  1964   X2   CHOLECYSTECTOMY     COSMETIC SURGERY  1980   removal extra fat inner thighs   EYE SURGERY Bilateral     Cataract extraction with IOL   INTRAOPERATIVE TRANSTHORACIC ECHOCARDIOGRAM N/A 03/16/2021   Procedure: INTRAOPERATIVE TRANSTHORACIC ECHOCARDIOGRAM;  Surgeon: Orbie Pyo, MD;  Location: [facility] OR;  Service: Open Heart Surgery;  Laterality: N/A;   RIGHT HEART CATH AND CORONARY ANGIOGRAPHY N/A 02/17/2021   Procedure: RIGHT HEART CATH AND CORONARY ANGIOGRAPHY;  Surgeon: Orbie Pyo, MD;  Location: [facility] INVASIVE CV LAB;  Service: Cardiovascular;  Laterality: N/A;   SVT ABLATION N/A 06/27/2022   Procedure: SVT ABLATION;  Surgeon: Marinus Maw, MD;  Location: [facility] INVASIVE CV LAB;  Service: Cardiovascular;  Laterality: N/A;   TOTAL KNEE ARTHROPLASTY Right 11/30/2015   Procedure:  RIGHT TOTAL KNEE ARTHROPLASTY;  Surgeon: Ollen Gross, MD;  Location: [facility] ORS;  Service: Orthopedics;  Laterality: Right;   TOTAL KNEE ARTHROPLASTY Left 11/14/2016   Procedure: LEFT TOTAL KNEE ARTHROPLASTY;  Surgeon: Ollen Gross, MD;  Location: [facility] ORS;  Service: Orthopedics;  Laterality: Left;   TRANSCATHETER AORTIC VALVE REPLACEMENT, TRANSFEMORAL Right 03/16/2021   Procedure: TRANSCATHETER AORTIC VALVE REPLACEMENT, TRANSFEMORAL;  Surgeon: Orbie Pyo, MD;  Location: [facility] OR;  Service: Open Heart Surgery;  Laterality: Right;     Social History:   reports that she has never smoked. She has never used smokeless tobacco. She reports that she does not drink alcohol and does not use drugs.   Family History:  Her family history includes Colon cancer in her mother; Heart attack in her brother; Hypertension in her brother. There is no history of Stroke.   Allergies Allergies  Allergen Reactions   Sulfa Antibiotics Nausea And Vomiting and Other (See Comments)   Ativan [Lorazepam] Other (See Comments)    Hyperactive    Tizanidine Hcl Other (See Comments)    Pt does not remember      Home Medications  Prior to Admission medications   Medication Sig Start Date End Date Taking? Authorizing Provider  acetaminophen (TYLENOL) 650 MG CR tablet Take 1,300 mg by mouth in the morning and at bedtime.    [provider]  alendronate (FOSAMAX) 70 MG tablet Take 1 tablet by mouth every Thursday.    [provider]  amoxicillin (AMOXIL) 500 MG tablet Take by mouth. TAKE 4 TABLETS BY MOUTH PRIOR TO DENTAL APPOINTMENTS    [provider]  aspirin EC 81 MG tablet Take 81 mg by mouth daily. Swallow whole.    [provider]  atorvastatin (LIPITOR) 10 MG tablet Take 10 mg by mouth at bedtime.  02/11/15   [provider]  buPROPion (WELLBUTRIN XL) 300 MG 24 hr tablet Take 300 mg by mouth daily after breakfast.    [provider]  cyclobenzaprine (FLEXERIL)  10 MG tablet Take 1 tablet (10 mg total) by mouth at bedtime. 04/14/22   Medina-Vargas, Monina C, NP  DULoxetine (CYMBALTA) 30 MG capsule Take 90 mg by mouth daily after breakfast.    [provider]  famotidine (PEPCID) 20 MG tablet Take 20 mg by mouth daily as needed for heartburn.    [provider]  ferrous sulfate 325 (65 FE) MG tablet Take 325 mg by mouth daily.    [provider]  gabapentin (NEURONTIN) 300 MG capsule Take 300 mg by mouth 3 (three) times daily. 12/20/19   [provider]  lidocaine (LIDODERM) 5 % Place 1 patch onto the skin daily. Remove & Discard patch within 12 hours or as directed by MD. Apply to site of pain Patient taking differently: Place 1 patch onto the skin daily as needed (pain). Remove & Discard patch within 12 hours or as directed by MD. Apply to site of pain 09/27/21   Pokhrel, Rebekah Chesterfield, MD  Melatonin 5 MG CAPS Take 1 capsule by mouth at bedtime.    [provider]  Menthol, Topical Analgesic, (BIOFREEZE EX) Apply 1 Application topically daily as needed (pain).    [provider]  metoprolol succinate (TOPROL-XL) 50 MG 24 hr tablet Take 1 tablet (50 mg total) by mouth daily. Take with or immediately following a meal. You may take an extra 50 mg dose if you experience palpitations. 04/20/22   Meriam Sprague, MD     Critical care time:  N/A    MDM: Moderate  Mechele Collin, M.D. [facility] 08/19/2022 11:41 PM   See Amion for personal pager For hours between 7 PM to 7 AM, please call Elink for urgent questions

## 2022-08-19 NOTE — Procedures (Addendum)
Thoracentesis  Procedure Note  Nicole Graves  [id number]  1945-06-05  Date:08/19/22  Time:11:27 PM   Provider Performing:Deval Mroczka D Suzie Portela   Procedure: Thoracentesis with imaging guidance ([id number])  Indication(s) Large Right Pleural Effusion  Consent Risks of the procedure as well as the alternatives and risks of each were explained to the patient and/or caregiver.  Consent for the procedure was obtained and is signed in the bedside chart  Anesthesia Topical only with 1% lidocaine    Time Out Verified patient identification, verified procedure, site/side was marked, verified correct patient position, special equipment/implants available, medications/allergies/relevant history reviewed, required imaging and test results available.   Sterile Technique Maximal sterile technique including full sterile barrier drape, hand hygiene, sterile gown, sterile gloves, mask, hair covering, sterile ultrasound probe cover (if used).  Procedure Description Ultrasound was used to identify appropriate pleural anatomy for placement and overlying skin marked.  Area of drainage cleaned and draped in sterile fashion. Lidocaine was used to anesthetize the skin and subcutaneous tissue.  500 cc's of tea colored appearing fluid w/ small white fibrin clots was drained from the right pleural space. Catheter then removed and bandaid applied to site.   Complications/Tolerance None; patient tolerated the procedure well. Chest X-ray is ordered to confirm no post-procedural complication.   EBL Minimal   Specimen(s) Pleural fluid   JD Anselm Lis [facility] [facility] 08/19/2022, 11:28 PM  Please see [email or web address] for pager details.  From 7A-7P if no response, please call [phone]. After hours, please call ELink [phone].

## 2022-08-20 ENCOUNTER — Inpatient Hospital Stay (HOSPITAL_COMMUNITY): Payer: Medicare PPO

## 2022-08-20 DIAGNOSIS — J90 Pleural effusion, not elsewhere classified: Secondary | ICD-10-CM | POA: Diagnosis not present

## 2022-08-20 LAB — PROTEIN, PLEURAL OR PERITONEAL FLUID: Total protein, fluid: <3 g/dL

## 2022-08-20 LAB — COMPREHENSIVE METABOLIC PANEL
ALT: 10 U/L (ref 0–44)
AST: 13 U/L — ABNORMAL LOW (ref 15–41)
Albumin: 2.2 g/dL — ABNORMAL LOW (ref 3.5–5.0)
Alkaline Phosphatase: 81 U/L (ref 38–126)
Anion gap: 11 (ref 5–15)
BUN: 16 mg/dL (ref 8–23)
CO2: 21 mmol/L — ABNORMAL LOW (ref 22–32)
Calcium: 8.1 mg/dL — ABNORMAL LOW (ref 8.9–10.3)
Chloride: 98 mmol/L (ref 98–111)
Creatinine, Ser: 1.15 mg/dL — ABNORMAL HIGH (ref 0.44–1.00)
GFR, Estimated: 48 mL/min — ABNORMAL LOW (ref >60)
Glucose, Bld: 102 mg/dL — ABNORMAL HIGH (ref 70–99)
Potassium: 3.5 mmol/L (ref 3.5–5.1)
Sodium: 130 mmol/L — ABNORMAL LOW (ref 135–145)
Total Bilirubin: 1.1 mg/dL (ref 0.3–1.2)
Total Protein: 5.7 g/dL — ABNORMAL LOW (ref 6.5–8.1)

## 2022-08-20 LAB — CBC
HCT: 24.8 % — ABNORMAL LOW (ref 36.0–46.0)
Hemoglobin: 7.7 g/dL — ABNORMAL LOW (ref 12.0–15.0)
MCH: 28.8 pg (ref 26.0–34.0)
MCHC: 31 g/dL (ref 30.0–36.0)
MCV: 92.9 fL (ref 80.0–100.0)
Platelets: 430 10*3/uL — ABNORMAL HIGH (ref 150–400)
RBC: 2.67 MIL/uL — ABNORMAL LOW (ref 3.87–5.11)
RDW: 14.1 % (ref 11.5–15.5)
WBC: 13 10*3/uL — ABNORMAL HIGH (ref 4.0–10.5)
nRBC: 0 % (ref 0.0–0.2)

## 2022-08-20 LAB — LACTATE DEHYDROGENASE, PLEURAL OR PERITONEAL FLUID: LD, Fluid: 148 U/L — ABNORMAL HIGH (ref 3–23)

## 2022-08-20 LAB — BODY FLUID CELL COUNT WITH DIFFERENTIAL
Lymphs, Fluid: 56 %
Neutrophil Count, Fluid: 44 % — ABNORMAL HIGH (ref 0–25)
Total Nucleated Cell Count, Fluid: 1980 cu mm — ABNORMAL HIGH (ref 0–1000)

## 2022-08-20 LAB — BRAIN NATRIURETIC PEPTIDE: B Natriuretic Peptide: 302.5 pg/mL — ABNORMAL HIGH (ref 0.0–100.0)

## 2022-08-20 LAB — AMYLASE, PLEURAL OR PERITONEAL FLUID: Amylase, Fluid: 25 U/L

## 2022-08-20 LAB — PROTEIN, TOTAL: Total Protein: 6 g/dL — ABNORMAL LOW (ref 6.5–8.1)

## 2022-08-20 LAB — LACTATE DEHYDROGENASE: LDH: 112 U/L (ref 98–192)

## 2022-08-20 LAB — MAGNESIUM: Magnesium: 1.7 mg/dL (ref 1.7–2.4)

## 2022-08-20 LAB — TSH: TSH: 1.847 u[IU]/mL (ref 0.350–4.500)

## 2022-08-20 LAB — BODY FLUID CULTURE W GRAM STAIN

## 2022-08-20 MED ORDER — FENTANYL CITRATE PF 50 MCG/ML IJ SOSY
12.5000 ug | PREFILLED_SYRINGE | INTRAMUSCULAR | Status: DC | PRN
  Administered 2022-08-20 (×2): 25 ug via INTRAVENOUS
  Administered 2022-08-20 – 2022-08-21 (×2): 12.5 ug via INTRAVENOUS
  Filled 2022-08-20 (×4): qty 1

## 2022-08-20 NOTE — Progress Notes (Addendum)
   NAME:  Nicole Graves, MRN:  [id number], DOB:  1945-07-24, LOS: 1 ADMISSION DATE:  08/19/2022, CONSULTATION DATE:  08/19/2022 REFERRING MD:  Virgina Norfolk, DO , CHIEF COMPLAINT:  Bilateral pleural effusions    History of Present Illness:  77 year old female with severe AS s/p TAVR, chronic diastolic heart failure, Hx SVT s/p ablation, CKD stage II, anemia. She reports mechanical fall 1-2 weeks ago, hitting her right side of her ribs and arm.  Has had worsening shortness of breath associated with occasional nonproductive cough. Denies fevers, chills. Chest imaging including CTA neg for PE but demonstrated large bilateral pleural effusions with compressive atelectasis. Given lasix however due to dyspnea PCCM consulted by EDP for thoracentesis.   ED vitals stable. On room air with SpO2 91%. Labs significant for WBC 15. XR right shoulder and elbow neg for acute fracture. 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 Large bilateral pleural effusion with compressive atelectasis. Large hiatal hernia  7/6 no acute events overnight, states dyspnea is improved but not yet back to baseline.  Pleural LDH 148, total nucleated cells 1980, lights criteria positive  Interim History / Subjective:  Seen lying in bed in no acute distress Asking appropriate questions regarding pleural fluid study results updated  Objective   Blood pressure 95/68, pulse 98, temperature 97.7 F (36.5 C), temperature source Oral, resp. rate 15, height 5\' 3"  (1.6 m),  weight 83.6 kg, SpO2 95 %.        Intake/Output Summary (Last 24 hours) at 08/20/2022 1356 Last data filed at 08/20/2022 0850 Gross per 24 hour  Intake 120 ml  Output 300 ml  Net -180 ml   Filed Weights   08/20/22 0131  Weight: 83.6 kg    Examination: General: Acute on chronic ill-appearing elderly female lying in bed in no acute distress HEENT:/AT, MM pink/moist, PERRL,  Neuro: Oriented x 3, nonfocal CV: s1s2 regular rate and rhythm, no murmur, rubs, or gallops,  PULM: Diminished bilateral bases, no increased work of breathing, no added breath sounds, on room air GI: soft, bowel sounds active in all 4 quadrants, non-tender, non-distended, tolerating oral diet Extremities: warm/dry, no edema  Skin: no rashes or lesions   Resolved Hospital Problem list     Assessment & Plan:  Bilateral pleural effusions Recent mechanical fall -Thoracentesis on right 7/5 with dark yellow with small fibrinous clots. No evidence of hemothorax. Consider heart failure-related effusion with bilateral effusion. No recent respiratory infection. P: Exudative by lights criteria Follow cytology  ECHO pending  Encourage pulmonary hygiene Mobilize as able  PCCM will round again 7/8  Best Practice (right click and "Reselect all SmartList Selections" daily)  Per primary     Critical care time: NA  Zaron Zwiefelhofer D. Harris, NP-C [facility] Personal contact information can be found on Amion  If no contact or response made please call [phone] 08/20/2022, 2:18 PM

## 2022-08-20 NOTE — Progress Notes (Signed)
PROGRESS NOTE    ALIEGHA HOCKENBERRY  ZOX:[id number] DOB: 10/14/1945 DOA: 08/19/2022 PCP: [name], MD   Brief Narrative:  Nicole Graves is a 77 y.o. female with medical history significant for SVT s/p ablation (06/27/2022), severe AS s/p TAVR 2023, CKD stage IIIa, HLD, depression/anxiety who presented to the ED for evaluation of shortness of breath.  Patient found to have bilateral pleural effusions of unknown origin, pulmonology called for thoracentesis.  Hospitalist called for admission.  Assessment & Plan:   Principal Problem:   Pleural effusion, bilateral Active Problems:   S/P TAVR (transcatheter aortic valve replacement)   Chronic kidney disease, stage 3a (HCC)   Normocytic anemia   Depression with anxiety   Large bilateral pleural effusions Acute respiratory failure without hypoxia or hypercarbia -PCCM consulted for thoracentesis, appreciate assistance -Lytes criteria met for exudative effusion - trauma would be the most likely cause given her history and recent fall. -History of TAVR with recent SVT ablation would be exceedingly rare as cause given more likely cause of trauma - dark yellow fluid with small fibrinous clots noted -not grossly hemorrhagic -Update echocardiogram pending -Status post Lasix, hold off on further diuretics given CKD history -Patient currently on room air at rest, ambulatory oxygen screen pending   SVT s/p ablation 06/27/2022: Remains in sinus rhythm.  Follows with EP Dr. Ladona Ridgel.  Resume Toprol-XL.   AS s/p TAVR 2023: Echo pending   Facial dyskinesia: Perioral facial movement noted on admission.   Patient states this began 2 days ago.  No obvious etiology.  No expressive or receptive aphasia.   Will hold bupropion, Flexeril, Cymbalta at this time and have patient follow-up with PCP.  If symptoms resolve off these medications would consider transitioning to alternatives.   CKD stage IIIa: Near baseline, follow morning labs.   Normocytic  anemia: Hemoglobin 9.1; baseline around 11 No signs or symptoms of bleeding/blood loss, effusion was not hemorrhagic.   Hyperlipidemia: Continue atorvastatin.   Depression/anxiety: Holding home meds as above given questionable etiology of tardive dyskinesia.  DVT prophylaxis: SCDs Start: 08/19/22 2141 Code Status:   Code Status: Full Code Family Communication: None present  Status is: Inpatient  Dispo: The patient is from: Home              Anticipated d/c is to: Home              Anticipated d/c date is: 24 to 48 hours              Patient currently not medically stable for discharge  Consultants:  PCCM  Procedures:  Thoracentesis, right, 08/19/2022  Antimicrobials:  None currently indicated  Subjective: No acute issues or events overnight, postthoracentesis patient's respiratory status has not improved drastically per her own account.  She otherwise denies nausea vomiting diarrhea constipation headache fevers or chills.  Objective: Vitals:   08/20/22 0131 08/20/22 0400 08/20/22 0700 08/20/22 0754  BP: 92/74 100/60 (!) 87/73 (!) 92/54  Pulse: 91 91 99   Resp: 20 19 19    Temp: 97.7 F (36.5 C)   97.7 F (36.5 C)  TempSrc: Oral   Oral  SpO2: 98% 95% 95%   Weight: 83.6 kg     Height: 5\' 3"  (1.6 m)       Intake/Output Summary (Last 24 hours) at 08/20/2022 0817 Last data filed at 08/20/2022 0135 Gross per 24 hour  Intake 120 ml  Output --  Net 120 ml   Filed Weights   08/20/22 0131  Weight: 83.6 kg    Examination:  General:  Pleasantly resting in bed, No acute distress. HEENT: Orofacial dyskinesia, occasional. Neck:  Without mass or deformity.  Trachea is midline. Lungs: Bilateral rales, left greater than right Heart:  Regular rate and rhythm.  Without murmurs, rubs, or gallops. Abdomen:  Soft, nontender, nondistended.  Without guarding or rebound. Extremities: Without cyanosis, clubbing, edema, or obvious deformity. Skin:  Warm and dry, no erythema.  Data  Reviewed: I have personally reviewed following labs and imaging studies  CBC: Recent Labs  Lab 08/19/22 1647 08/20/22 0212  WBC 15.0* 13.0*  HGB 9.1* 7.7*  HCT 30.3* 24.8*  MCV 96.2 92.9  PLT 523* 430*   Basic Metabolic Panel: Recent Labs  Lab 08/19/22 1647 08/20/22 0212  NA 136 130*  K 4.4 3.5  CL 100 98  CO2 24 21*  GLUCOSE 113* 102*  BUN 18 16  CREATININE 1.11* 1.15*  CALCIUM 9.1 8.1*  MG  --  1.7   GFR: Estimated Creatinine Clearance: 40.6 mL/min (A) (by C-G formula based on SCr of 1.15 mg/dL (H)). Liver Function Tests: Recent Labs  Lab 08/20/22 0020 08/20/22 0212  AST  --  13*  ALT  --  10  ALKPHOS  --  81  BILITOT  --  1.1  PROT 6.0* 5.7*  ALBUMIN  --  2.2*   No results for input(s): "LIPASE", "AMYLASE" in the last 168 hours. No results for input(s): "AMMONIA" in the last 168 hours. Coagulation Profile: No results for input(s): "INR", "PROTIME" in the last 168 hours. Cardiac Enzymes: No results for input(s): "CKTOTAL", "CKMB", "CKMBINDEX", "TROPONINI" in the last 168 hours. BNP (last 3 results) No results for input(s): "PROBNP" in the last 8760 hours. HbA1C: No results for input(s): "HGBA1C" in the last 72 hours. CBG: No results for input(s): "GLUCAP" in the last 168 hours. Lipid Profile: No results for input(s): "CHOL", "HDL", "LDLCALC", "TRIG", "CHOLHDL", "LDLDIRECT" in the last 72 hours. Thyroid Function Tests: Recent Labs    08/20/22 0212  TSH 1.847   Anemia Panel: No results for input(s): "VITAMINB12", "FOLATE", "FERRITIN", "TIBC", "IRON", "RETICCTPCT" in the last 72 hours. Sepsis Labs: No results for input(s): "PROCALCITON", "LATICACIDVEN" in the last 168 hours.  Recent Results (from the past 240 hour(s))  Body fluid culture w Gram Stain     Status: None (Preliminary result)   Collection Time: 08/19/22 11:14 PM   Specimen: Pleural Fluid  Result Value Ref Range Status   Specimen Description PLEURAL  Final   Special Requests NONE   Final   Gram Stain   Final    FEW WBC PRESENT,BOTH PMN AND MONONUCLEAR NO ORGANISMS SEEN Performed at [facility], [address] [place], [place] [address]    Culture PENDING  Incomplete   Report Status PENDING  Incomplete         Radiology Studies: DG Chest Port 1 View  Result Date: 08/20/2022 CLINICAL DATA:  Status post thoracentesis EXAM: PORTABLE CHEST 1 VIEW COMPARISON:  08/19/2022 FINDINGS: Moderate left pleural effusion remains. Decreasing right pleural effusion following thoracentesis. No pneumothorax. Cardiomegaly. Status post TAVR. Left lower lobe atelectasis. IMPRESSION: Decreasing right effusion and improving right base atelectasis following thoracentesis. No pneumothorax. Continued moderate left effusion with left base atelectasis. Electronically Signed   By: Charlett Nose M.D.   On: 08/20/2022 00:19   CT Angio Chest PE W and/or Wo Contrast  Result Date: 08/19/2022 CLINICAL DATA:  Trauma bilateral effusions w/ unclear etiology. Shortness of breath,  chest pain. Fall 2 weeks ago. EXAM: CT ANGIOGRAPHY CHEST WITH CONTRAST TECHNIQUE: Multidetector CT imaging of the chest was performed using the standard protocol during bolus administration of intravenous contrast. Multiplanar CT image reconstructions and MIPs were obtained to evaluate the vascular anatomy. RADIATION DOSE REDUCTION: This exam was performed according to the departmental dose-optimization program which includes automated exposure control, adjustment of the mA and/or kV according to patient size and/or use of iterative reconstruction technique. CONTRAST:  55mL OMNIPAQUE IOHEXOL 350 MG/ML SOLN COMPARISON:  03/02/2021 FINDINGS: Cardiovascular: No filling defects in the pulmonary arteries to suggest pulmonary emboli. Mild cardiomegaly. Prior TAVR. Scattered coronary artery and aortic calcifications. No aneurysm. Mediastinum/Nodes: No mediastinal, hilar, or axillary adenopathy. Trachea and esophagus are unremarkable.  Thyroid unremarkable. Lungs/Pleura: Large bilateral pleural effusions. Compressive atelectasis in the lower lobes. Small pericardial effusion. Upper Abdomen: Large hiatal hernia.  No acute findings. Musculoskeletal: Chest wall soft tissues are unremarkable. No acute bony abnormality. Review of the MIP images confirms the above findings. IMPRESSION: No evidence of pulmonary embolus. Large bilateral pleural effusions with compressive atelectasis in the lower lobes. Large hiatal hernia. Scattered coronary artery disease. Aortic Atherosclerosis (ICD10-I70.0). Electronically Signed   By: Charlett Nose M.D.   On: 08/19/2022 20:13   DG Chest Portable 1 View  Result Date: 08/19/2022 CLINICAL DATA:  Chest pain, initial encounter EXAM: PORTABLE CHEST 1 VIEW COMPARISON:  08/16/2022 FINDINGS: Cardiac shadow is stable. TAVR is again noted. Small effusions are again seen right greater than left. Mild bibasilar atelectatic changes are seen. IMPRESSION: Small effusions and bibasilar atelectasis. Electronically Signed   By: Alcide Clever M.D.   On: 08/19/2022 19:27   DG Elbow 2 Views Right  Result Date: 08/19/2022 CLINICAL DATA:  Recent fall with right elbow pain, initial encounter EXAM: RIGHT ELBOW - 2 VIEW COMPARISON:  None Available. FINDINGS: There is no evidence of fracture, dislocation, or joint effusion. There is no evidence of arthropathy or other focal bone abnormality. Soft tissues are unremarkable. IMPRESSION: No acute abnormality noted. Electronically Signed   By: Alcide Clever M.D.   On: 08/19/2022 19:26   DG Shoulder Right  Result Date: 08/19/2022 CLINICAL DATA:  Recent fall with right shoulder pain, initial encounter EXAM: RIGHT SHOULDER - 2+ VIEW COMPARISON:  None Available. FINDINGS: Degenerative changes of the acromioclavicular joint are seen. Humeral head is high-riding related to underlying rotator cuff injury. Right-sided pleural effusion is noted. No other focal abnormality is seen. IMPRESSION:  Degenerative changes of the right shoulder joint. Right pleural effusion. Electronically Signed   By: Alcide Clever M.D.   On: 08/19/2022 19:25   DG Humerus Right  Result Date: 08/19/2022 CLINICAL DATA:  Recent fall with right arm pain, initial encounter EXAM: RIGHT HUMERUS - 2+ VIEW COMPARISON:  None Available. FINDINGS: Humeral head is high-riding likely related to chronic rotator cuff injury. No acute fracture or dislocation is noted. No soft tissue abnormality is seen IMPRESSION: Chronic changes without acute abnormality. Electronically Signed   By: Alcide Clever M.D.   On: 08/19/2022 19:24        Scheduled Meds:  atorvastatin  10 mg Oral QHS   lidocaine  1 patch Transdermal Q24H   metoprolol succinate  50 mg Oral Daily   sodium chloride flush  3 mL Intravenous Q12H   Continuous Infusions:   LOS: 1 day   Time spent:  Azucena Fallen, DO [facility]  If 7PM-7AM, please contact night-coverage [email or web address]  08/20/2022, 8:17 AM

## 2022-08-20 NOTE — ED Notes (Signed)
..ED TO INPATIENT HANDOFF REPORT  ED Nurse Name and Phone #: [phone]  S Name/Age/Gender Nicole Graves 77 y.o. female Room/Bed: 016C/016C  Code Status   Code Status: Full Code  Home/SNF/Other Home Patient oriented to: self, place, time, and situation Is this baseline? Yes   Triage Complete: Triage complete  Chief Complaint Pleural effusion, bilateral [J90]  Triage Note Pt came in via POV d/t heaviness in her chest when she breaths that has progressively gotten worse since she fell 2 times 2 weeks ago. States that she fell on her Rt side, did not hit her head but is hurting under her Rt breast & in her arm & states her voice sounds different caused by the way she is breathing (per pt). Does report Hx of cardiac problems but deniers her CP to be cardiac related.    Allergies Allergies  Allergen Reactions   Sulfa Antibiotics Nausea And Vomiting and Other (See Comments)   Ativan [Lorazepam] Other (See Comments)    Hyperactive    Tizanidine Hcl Other (See Comments)    Pt does not remember     Level of Care/Admitting Diagnosis ED Disposition     ED Disposition  Admit   Condition  --   Comment  Hospital Area: [facility] [[id number]]  Level of Care: Telemetry Cardiac [[id number]]  May admit patient to [facility] or [facility] if equivalent level of care is available:: No  Covid Evaluation: Asymptomatic - no recent exposure (last 10 days) testing not required  Diagnosis: Pleural effusion, bilateral [[id number]]  Admitting Physician: Charlsie Quest [[id number]]  Attending Physician: Charlsie Quest [[id number]]  Certification:: I certify this patient will need inpatient services for at least 2 midnights  Estimated Length of Stay: 2          B Medical/Surgery History Past Medical History:  Diagnosis Date   Anemia    Anxiety    Arthritis    Knees, neck, back   Chronic lower back pain    with siatica   Colon polyp 1999   Depression    GERD (gastroesophageal  reflux disease)    HTN (hypertension)    Hyperlipidemia    Mild aortic insufficiency    Mild mitral regurgitation    Orthostatic hypotension    PAT (paroxysmal atrial tachycardia)    PONV (postoperative nausea and vomiting)    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 Lynnette Caffey and Dr. Laneta Simmers   Severe aortic stenosis    Stage 3b chronic kidney disease (CKD) (HCC)    SVT (supraventricular tachycardia)    TR (tricuspid regurgitation) 09/19/2016   mild by echo 09/2021   Past Surgical History:  Procedure Laterality Date   CESAREAN SECTION  1964   X2   CHOLECYSTECTOMY     COSMETIC SURGERY  1980   removal extra fat inner thighs   EYE SURGERY Bilateral     Cataract extraction with IOL   INTRAOPERATIVE TRANSTHORACIC ECHOCARDIOGRAM N/A 03/16/2021   Procedure: INTRAOPERATIVE TRANSTHORACIC ECHOCARDIOGRAM;  Surgeon: Orbie Pyo, MD;  Location: [facility] OR;  Service: Open Heart Surgery;  Laterality: N/A;   RIGHT HEART CATH AND CORONARY ANGIOGRAPHY N/A 02/17/2021   Procedure: RIGHT HEART CATH AND CORONARY ANGIOGRAPHY;  Surgeon: Orbie Pyo, MD;  Location: [facility] INVASIVE CV LAB;  Service: Cardiovascular;  Laterality: N/A;   SVT ABLATION N/A 06/27/2022   Procedure: SVT ABLATION;  Surgeon:  Marinus Maw, MD;  Location: [facility] INVASIVE CV LAB;  Service: Cardiovascular;  Laterality: N/A;   TOTAL KNEE ARTHROPLASTY Right 11/30/2015   Procedure: RIGHT TOTAL KNEE ARTHROPLASTY;  Surgeon: Ollen Gross, MD;  Location: [facility] ORS;  Service: Orthopedics;  Laterality: Right;   TOTAL KNEE ARTHROPLASTY Left 11/14/2016   Procedure: LEFT TOTAL KNEE ARTHROPLASTY;  Surgeon: Ollen Gross, MD;  Location: [facility] ORS;  Service: Orthopedics;  Laterality: Left;   TRANSCATHETER AORTIC VALVE REPLACEMENT, TRANSFEMORAL Right 03/16/2021   Procedure: TRANSCATHETER AORTIC VALVE REPLACEMENT, TRANSFEMORAL;  Surgeon: Orbie Pyo, MD;  Location: [facility] OR;   Service: Open Heart Surgery;  Laterality: Right;     A IV Location/Drains/Wounds Patient Lines/Drains/Airways Status     Active Line/Drains/Airways     Name Placement date Placement time Site Days   Peripheral IV 08/19/22 20 G Left Antecubital 08/19/22  1901  Antecubital  1            Intake/Output Last 24 hours No intake or output data in the 24 hours ending 08/20/22 0057  Labs/Imaging Results for orders placed or performed during the hospital encounter of 08/19/22 (from the past 48 hour(s))  Basic metabolic panel     Status: Abnormal   Collection Time: 08/19/22  4:47 PM  Result Value Ref Range   Sodium 136 135 - 145 mmol/L   Potassium 4.4 3.5 - 5.1 mmol/L   Chloride 100 98 - 111 mmol/L   CO2 24 22 - 32 mmol/L   Glucose, Bld 113 (H) 70 - 99 mg/dL    Comment: Glucose reference range applies only to samples taken after fasting for at least 8 hours.   BUN 18 8 - 23 mg/dL   Creatinine, Ser 1.30 (H) 0.44 - 1.00 mg/dL   Calcium 9.1 8.9 - 86.5 mg/dL   GFR, Estimated 51 (L) >60 mL/min    Comment: (NOTE) Calculated using the CKD-EPI Creatinine Equation (2021)    Anion gap 12 5 - 15    Comment: Performed at [facility], [address] [place], [place] [address]  CBC     Status: Abnormal   Collection Time: 08/19/22  4:47 PM  Result Value Ref Range   WBC 15.0 (H) 4.0 - 10.5 K/uL   RBC 3.15 (L) 3.87 - 5.11 MIL/uL   Hemoglobin 9.1 (L) 12.0 - 15.0 g/dL   HCT 62.9 (L) 52.8 - 41.3 %   MCV 96.2 80.0 - 100.0 fL   MCH 28.9 26.0 - 34.0 pg   MCHC 30.0 30.0 - 36.0 g/dL   RDW 24.4 01.0 - 27.2 %   Platelets 523 (H) 150 - 400 K/uL   nRBC 0.0 0.0 - 0.2 %    Comment: Performed at [facility], [address] [place], [place] [address]  Troponin I (High Sensitivity)     Status: None   Collection Time: 08/19/22  4:47 PM  Result Value Ref Range   Troponin I (High Sensitivity) 11 <18 ng/L    Comment: (NOTE) Elevated high sensitivity troponin I (hsTnI) values and  significant  changes across serial measurements may suggest ACS but many other  chronic and acute conditions are known to elevate hsTnI results.  Refer to the "Links" section for chest pain algorithms and additional  guidance. Performed at [facility], [address] [place], [place] [address]   Troponin I (High Sensitivity)     Status: None   Collection Time: 08/19/22  6:38 PM  Result Value Ref Range  Troponin I (High Sensitivity) 9 <18 ng/L    Comment: (NOTE) Elevated high sensitivity troponin I (hsTnI) values and significant  changes across serial measurements may suggest ACS but many other  chronic and acute conditions are known to elevate hsTnI results.  Refer to the "Links" section for chest pain algorithms and additional  guidance. Performed at [facility], [address] [place], [place] [address]   Body fluid culture w Gram Stain     Status: None (Preliminary result)   Collection Time: 08/19/22 11:14 PM   Specimen: Pleural Fluid  Result Value Ref Range   Specimen Description PLEURAL    Special Requests NONE    Gram Stain      FEW WBC PRESENT,BOTH PMN AND MONONUCLEAR NO ORGANISMS SEEN Performed at [facility], [address] [place], [place] [address]    Culture PENDING    Report Status PENDING   Protein, pleural or peritoneal fluid     Status: None   Collection Time: 08/19/22 11:30 PM  Result Value Ref Range   Total protein, fluid <3.0 g/dL    Comment: (NOTE) No normal range established for this test Results should be evaluated in conjunction with serum values    Fluid Type-FTP PLEURAL R     Comment: Performed at [facility], [address] [place], [place] [address]  Lactate dehydrogenase (pleural or peritoneal fluid)     Status: Abnormal   Collection Time: 08/19/22 11:30 PM  Result Value Ref Range   LD, Fluid 148 (H) 3 - 23 U/L    Comment: (NOTE) Results should be evaluated in conjunction with serum values    Fluid  Type-FLDH Pleural R     Comment: Performed at [facility], [address] [place], [place] [address]   DG Chest Port 1 View  Result Date: 08/20/2022 CLINICAL DATA:  Status post thoracentesis EXAM: PORTABLE CHEST 1 VIEW COMPARISON:  08/19/2022 FINDINGS: Moderate left pleural effusion remains. Decreasing right pleural effusion following thoracentesis. No pneumothorax. Cardiomegaly. Status post TAVR. Left lower lobe atelectasis. IMPRESSION: Decreasing right effusion and improving right base atelectasis following thoracentesis. No pneumothorax. Continued moderate left effusion with left base atelectasis. Electronically Signed   By: Charlett Nose M.D.   On: 08/20/2022 00:19   CT Angio Chest PE W and/or Wo Contrast  Result Date: 08/19/2022 CLINICAL DATA:  Trauma bilateral effusions w/ unclear etiology. Shortness of breath, chest pain. Fall 2 weeks ago. EXAM: CT ANGIOGRAPHY CHEST WITH CONTRAST TECHNIQUE: Multidetector CT imaging of the chest was performed using the standard protocol during bolus administration of intravenous contrast. Multiplanar CT image reconstructions and MIPs were obtained to evaluate the vascular anatomy. RADIATION DOSE REDUCTION: This exam was performed according to the departmental dose-optimization program which includes automated exposure control, adjustment of the mA and/or kV according to patient size and/or use of iterative reconstruction technique. CONTRAST:  55mL OMNIPAQUE IOHEXOL 350 MG/ML SOLN COMPARISON:  03/02/2021 FINDINGS: Cardiovascular: No filling defects in the pulmonary arteries to suggest pulmonary emboli. Mild cardiomegaly. Prior TAVR. Scattered coronary artery and aortic calcifications. No aneurysm. Mediastinum/Nodes: No mediastinal, hilar, or axillary adenopathy. Trachea and esophagus are unremarkable. Thyroid unremarkable. Lungs/Pleura: Large bilateral pleural effusions. Compressive atelectasis in the lower lobes. Small pericardial effusion. Upper Abdomen: Large  hiatal hernia.  No acute findings. Musculoskeletal: Chest wall soft tissues are unremarkable. No acute bony abnormality. Review of the MIP images confirms the above findings. IMPRESSION: No evidence of pulmonary embolus. Large bilateral pleural effusions  with compressive atelectasis in the lower lobes. Large hiatal hernia. Scattered coronary artery disease. Aortic Atherosclerosis (ICD10-I70.0). Electronically Signed   By: Charlett Nose M.D.   On: 08/19/2022 20:13   DG Chest Portable 1 View  Result Date: 08/19/2022 CLINICAL DATA:  Chest pain, initial encounter EXAM: PORTABLE CHEST 1 VIEW COMPARISON:  08/16/2022 FINDINGS: Cardiac shadow is stable. TAVR is again noted. Small effusions are again seen right greater than left. Mild bibasilar atelectatic changes are seen. IMPRESSION: Small effusions and bibasilar atelectasis. Electronically Signed   By: Alcide Clever M.D.   On: 08/19/2022 19:27   DG Elbow 2 Views Right  Result Date: 08/19/2022 CLINICAL DATA:  Recent fall with right elbow pain, initial encounter EXAM: RIGHT ELBOW - 2 VIEW COMPARISON:  None Available. FINDINGS: There is no evidence of fracture, dislocation, or joint effusion. There is no evidence of arthropathy or other focal bone abnormality. Soft tissues are unremarkable. IMPRESSION: No acute abnormality noted. Electronically Signed   By: Alcide Clever M.D.   On: 08/19/2022 19:26   DG Shoulder Right  Result Date: 08/19/2022 CLINICAL DATA:  Recent fall with right shoulder pain, initial encounter EXAM: RIGHT SHOULDER - 2+ VIEW COMPARISON:  None Available. FINDINGS: Degenerative changes of the acromioclavicular joint are seen. Humeral head is high-riding related to underlying rotator cuff injury. Right-sided pleural effusion is noted. No other focal abnormality is seen. IMPRESSION: Degenerative changes of the right shoulder joint. Right pleural effusion. Electronically Signed   By: Alcide Clever M.D.   On: 08/19/2022 19:25   DG Humerus Right  Result  Date: 08/19/2022 CLINICAL DATA:  Recent fall with right arm pain, initial encounter EXAM: RIGHT HUMERUS - 2+ VIEW COMPARISON:  None Available. FINDINGS: Humeral head is high-riding likely related to chronic rotator cuff injury. No acute fracture or dislocation is noted. No soft tissue abnormality is seen IMPRESSION: Chronic changes without acute abnormality. Electronically Signed   By: Alcide Clever M.D.   On: 08/19/2022 19:24    Pending Labs Unresulted Labs (From admission, onward)     Start     Ordered   08/20/22 0500  Magnesium  Tomorrow morning,   R        08/19/22 2141   08/20/22 0500  TSH  Tomorrow morning,   R        08/19/22 2141   08/20/22 0500  CBC  Tomorrow morning,   R        08/19/22 2141   08/20/22 0500  Comprehensive metabolic panel  Tomorrow morning,   R        08/19/22 2141   08/19/22 2336  Amylase, pleural or peritoneal fluid     (Thoracentesis Labs Panel)  Once,   R        08/19/22 2335   08/19/22 2314  Body fluid cell count with differential  (Thoracentesis Labs Panel)  Once,   R       Question:  Are there also cytology or pathology orders on this specimen?  Answer:  Yes   08/19/22 2313   08/19/22 2314  Lactate dehydrogenase  (Thoracentesis Labs Panel)  Once,   R        08/19/22 2313   08/19/22 2314  Protein, total  (Thoracentesis Labs Panel)  Once,   R        08/19/22 2313   08/19/22 1940  Brain natriuretic peptide  Once,   URGENT        08/19/22 1940  Vitals/Pain Today's Vitals   08/19/22 2330 08/20/22 0000 08/20/22 0015 08/20/22 0055  BP: (!) 96/59 98/64  (!) 90/59  Pulse: 89 90 91 92  Resp: 20 17 19 20   Temp: 98 F (36.7 C)   98 F (36.7 C)  TempSrc:      SpO2: 96% 98% 97% 96%  PainSc:        Isolation Precautions No active isolations  Medications Medications  lidocaine (LIDODERM) 5 % 1 patch (1 patch Transdermal Patch Applied 08/19/22 2008)  sodium chloride flush (NS) 0.9 % injection 3 mL (3 mLs Intravenous Not Given 08/19/22 2153)   acetaminophen (TYLENOL) tablet 650 mg (has no administration in time range)    Or  acetaminophen (TYLENOL) suppository 650 mg (has no administration in time range)  ondansetron (ZOFRAN) tablet 4 mg (has no administration in time range)    Or  ondansetron (ZOFRAN) injection 4 mg (has no administration in time range)  senna-docusate (Senokot-S) tablet 1 tablet (has no administration in time range)  atorvastatin (LIPITOR) tablet 10 mg (10 mg Oral Given 08/20/22 0016)  famotidine (PEPCID) tablet 20 mg (has no administration in time range)  melatonin tablet 5 mg (has no administration in time range)  metoprolol succinate (TOPROL-XL) 24 hr tablet 50 mg (has no administration in time range)  fentaNYL (SUBLIMAZE) injection 50 mcg (50 mcg Intravenous Given 08/19/22 1926)  iohexol (OMNIPAQUE) 350 MG/ML injection 55 mL (55 mLs Intravenous Contrast Given 08/19/22 2007)  furosemide (LASIX) injection 40 mg (40 mg Intravenous Given 08/19/22 2029)    Mobility non-ambulatory     Focused Assessments Cardiac Assessment Handoff:    Lab Results  Component Value Date   CKTOTAL 50 10/09/2008   CKMB 1.4 10/09/2008   TROPONINI 0.13 (H) 02/15/2015   No results found for: "DDIMER" Does the Patient currently have chest pain? No    R Recommendations: See Admitting Provider Note  Report given to:   Additional Notes: pt had a thoracentesis done, is resting easier. Has a purewick on

## 2022-08-21 ENCOUNTER — Inpatient Hospital Stay (HOSPITAL_COMMUNITY): Payer: Medicare PPO

## 2022-08-21 DIAGNOSIS — R0609 Other forms of dyspnea: Secondary | ICD-10-CM | POA: Diagnosis not present

## 2022-08-21 DIAGNOSIS — J90 Pleural effusion, not elsewhere classified: Secondary | ICD-10-CM | POA: Diagnosis not present

## 2022-08-21 LAB — CBC WITH DIFFERENTIAL/PLATELET
Abs Immature Granulocytes: 0.06 10*3/uL (ref 0.00–0.07)
Basophils Absolute: 0 10*3/uL (ref 0.0–0.1)
Basophils Relative: 0 %
Eosinophils Absolute: 0.1 10*3/uL (ref 0.0–0.5)
Eosinophils Relative: 1 %
HCT: 26.6 % — ABNORMAL LOW (ref 36.0–46.0)
Hemoglobin: 8.1 g/dL — ABNORMAL LOW (ref 12.0–15.0)
Immature Granulocytes: 1 %
Lymphocytes Relative: 11 %
Lymphs Abs: 1.3 10*3/uL (ref 0.7–4.0)
MCH: 28.2 pg (ref 26.0–34.0)
MCHC: 30.5 g/dL (ref 30.0–36.0)
MCV: 92.7 fL (ref 80.0–100.0)
Monocytes Absolute: 0.8 10*3/uL (ref 0.1–1.0)
Monocytes Relative: 7 %
Neutro Abs: 8.9 10*3/uL — ABNORMAL HIGH (ref 1.7–7.7)
Neutrophils Relative %: 80 %
Platelets: 477 10*3/uL — ABNORMAL HIGH (ref 150–400)
RBC: 2.87 MIL/uL — ABNORMAL LOW (ref 3.87–5.11)
RDW: 13.9 % (ref 11.5–15.5)
WBC: 11.2 10*3/uL — ABNORMAL HIGH (ref 4.0–10.5)
nRBC: 0 % (ref 0.0–0.2)

## 2022-08-21 LAB — ECHOCARDIOGRAM COMPLETE
AR max vel: 1.88 cm2
AV Peak grad: 13.2 mmHg
Ao pk vel: 1.82 m/s
Area-P 1/2: 4.49 cm2
Height: 63 in
S' Lateral: 2.8 cm
Weight: 2838.4 oz

## 2022-08-21 LAB — BASIC METABOLIC PANEL
Anion gap: 11 (ref 5–15)
BUN: 20 mg/dL (ref 8–23)
CO2: 22 mmol/L (ref 22–32)
Calcium: 8.4 mg/dL — ABNORMAL LOW (ref 8.9–10.3)
Chloride: 101 mmol/L (ref 98–111)
Creatinine, Ser: 1.1 mg/dL — ABNORMAL HIGH (ref 0.44–1.00)
GFR, Estimated: 51 mL/min — ABNORMAL LOW (ref >60)
Glucose, Bld: 109 mg/dL — ABNORMAL HIGH (ref 70–99)
Potassium: 3.8 mmol/L (ref 3.5–5.1)
Sodium: 134 mmol/L — ABNORMAL LOW (ref 135–145)

## 2022-08-21 LAB — CBC
HCT: 25.8 % — ABNORMAL LOW (ref 36.0–46.0)
Hemoglobin: 8.1 g/dL — ABNORMAL LOW (ref 12.0–15.0)
MCH: 28.4 pg (ref 26.0–34.0)
MCHC: 31.4 g/dL (ref 30.0–36.0)
MCV: 90.5 fL (ref 80.0–100.0)
Platelets: 469 10*3/uL — ABNORMAL HIGH (ref 150–400)
RBC: 2.85 MIL/uL — ABNORMAL LOW (ref 3.87–5.11)
RDW: 14 % (ref 11.5–15.5)
WBC: 10.9 10*3/uL — ABNORMAL HIGH (ref 4.0–10.5)
nRBC: 0 % (ref 0.0–0.2)

## 2022-08-21 MED ORDER — BUPROPION HCL ER (XL) 150 MG PO TB24
300.0000 mg | ORAL_TABLET | Freq: Every day | ORAL | Status: DC
  Administered 2022-08-21 – 2022-08-22 (×2): 300 mg via ORAL
  Filled 2022-08-21 (×2): qty 2

## 2022-08-21 MED ORDER — GABAPENTIN 300 MG PO CAPS
300.0000 mg | ORAL_CAPSULE | Freq: Three times a day (TID) | ORAL | Status: DC
  Administered 2022-08-21 – 2022-08-22 (×4): 300 mg via ORAL
  Filled 2022-08-21 (×4): qty 1

## 2022-08-21 MED ORDER — OXYCODONE HCL 5 MG PO TABS
5.0000 mg | ORAL_TABLET | ORAL | Status: DC | PRN
  Administered 2022-08-21: 5 mg via ORAL
  Filled 2022-08-21: qty 1

## 2022-08-21 MED ORDER — DULOXETINE HCL 60 MG PO CPEP
90.0000 mg | ORAL_CAPSULE | Freq: Every day | ORAL | Status: DC
  Administered 2022-08-22: 90 mg via ORAL
  Filled 2022-08-21: qty 1

## 2022-08-21 NOTE — Progress Notes (Signed)
Echocardiogram 2D Echocardiogram has been performed.  Nicole Graves 08/21/2022, 4:26 PM

## 2022-08-21 NOTE — Progress Notes (Signed)
PROGRESS NOTE    [name]  WUJ:[id number] DOB: Jul 15, 1945 DOA: 08/19/2022 PCP: [name], MD   Brief Narrative:  Nicole Graves is a 77 y.o. female with medical history significant for SVT s/p ablation (06/27/2022), severe AS s/p TAVR 2023, CKD stage IIIa, HLD, depression/anxiety who presented to the ED for evaluation of shortness of breath.  Patient found to have bilateral pleural effusions of unknown origin, pulmonology called for thoracentesis.  Hospitalist called for admission.  Assessment & Plan:   Principal Problem:   Pleural effusion, bilateral Active Problems:   S/P TAVR (transcatheter aortic valve replacement)   Chronic kidney disease, stage 3a (HCC)   Normocytic anemia   Depression with anxiety   Large bilateral pleural effusions Acute respiratory failure with hypoxia; without hypercarbia -PCCM consulted for thoracentesis, appreciate assistance -Lytes criteria met for exudative effusion - trauma would be the most likely cause given her history and recent fall. -History of TAVR with recent SVT ablation would be exceedingly rare as cause given more likely cause of trauma - dark yellow fluid with small fibrinous clots noted -not grossly hemorrhagic -Update echocardiogram still pending(ordered 08/19/2022) -Hold further diuretics given CKD -Patient having profound respiratory symptoms and hypoxia with short distance of ambulation today, 30 feet, with dizziness fatigue weakness -orthostatic blood pressure pending as well.  Will repeat ambulatory oxygen screen as possible once patient is recovered.   SVT s/p ablation 06/27/2022: Remains in sinus rhythm.  Follows with EP Dr. Ladona Ridgel.  Resume Toprol-XL.   AS s/p TAVR 2023: Echo pending   Facial dyskinesia, chronic: Patient reports today 7/7 that her facial dyskinesia is somewhat chronic since last August 2023.  She notes it was attributed to [facility] and has not resolved since that time.  Miscommunication at admission  indicated this was a new diagnosis which is inaccurate, it is chronic. -Resume patient's home Cymbalta and Wellbutrin -follow clinically.  Defer to her outpatient physician for further monitoring or adjusting these medications.   CKD stage IIIa: -Near baseline, follow morning labs.   Normocytic anemia: -Hemoglobin 8.1 and stable; baseline around 11 -No signs or symptoms of bleeding/blood loss, effusion was not hemorrhagic.   Hyperlipidemia: Continue atorvastatin.   Depression/anxiety: Holding home meds as above given questionable etiology of tardive dyskinesia.  DVT prophylaxis: SCDs Start: 08/19/22 2141 Code Status:   Code Status: Full Code Family Communication: None present  Status is: Inpatient  Dispo: The patient is from: Home              Anticipated d/c is to: Home              Anticipated d/c date is: 24 to 48 hours              Patient currently not medically stable for discharge  Consultants:  PCCM  Procedures:  Thoracentesis, right, 08/19/2022  Antimicrobials:  None currently indicated  Subjective: No acute issues or events overnight, patient having profound respiratory symptoms fatigue weakness and dizziness with ambulation today.  If symptoms ongoing in the next 24 hours may consider evaluation for left-sided thoracentesis if patient is amenable.  Denies chest pain nausea vomiting diarrhea constipation headache fevers or chills.  Objective: Vitals:   08/21/22 0233 08/21/22 0523 08/21/22 0524 08/21/22 0719  BP: 115/69   123/61  Pulse: 94 89  94  Resp: 20 (!) 26 20 20   Temp: 98.3 F (36.8 C)   98.4 F (36.9 C)  TempSrc: Oral   Oral  SpO2: 96% 96%  96%  Weight:  80.5 kg    Height:        Intake/Output Summary (Last 24 hours) at 08/21/2022 0751 Last data filed at 08/20/2022 2338 Gross per 24 hour  Intake --  Output 500 ml  Net -500 ml    Filed Weights   08/20/22 0131 08/21/22 0523  Weight: 83.6 kg 80.5 kg    Examination:  General:  Pleasantly  resting in bed, No acute distress. HEENT: Orofacial dyskinesia, occasional. Neck:  Without mass or deformity.  Trachea is midline. Lungs: Bilateral rales, diffuse bibasilar Heart:  Regular rate and rhythm.  Without murmurs, rubs, or gallops. Abdomen:  Soft, nontender, nondistended.  Without guarding or rebound. Extremities: Without cyanosis, clubbing, edema, or obvious deformity. Skin:  Warm and dry, no erythema.  Data Reviewed: I have personally reviewed following labs and imaging studies  CBC: Recent Labs  Lab 08/19/22 1647 08/20/22 0212 08/21/22 0233  WBC 15.0* 13.0* 10.9*  HGB 9.1* 7.7* 8.1*  HCT 30.3* 24.8* 25.8*  MCV 96.2 92.9 90.5  PLT 523* 430* 469*    Basic Metabolic Panel: Recent Labs  Lab 08/19/22 1647 08/20/22 0212 08/21/22 0233  NA 136 130* 134*  K 4.4 3.5 3.8  CL 100 98 101  CO2 24 21* 22  GLUCOSE 113* 102* 109*  BUN 18 16 20   CREATININE 1.11* 1.15* 1.10*  CALCIUM 9.1 8.1* 8.4*  MG  --  1.7  --     GFR: Estimated Creatinine Clearance: 41.6 mL/min (A) (by C-G formula based on SCr of 1.1 mg/dL (H)). Liver Function Tests: Recent Labs  Lab 08/20/22 0020 08/20/22 0212  AST  --  13*  ALT  --  10  ALKPHOS  --  81  BILITOT  --  1.1  PROT 6.0* 5.7*  ALBUMIN  --  2.2*    Thyroid Function Tests: Recent Labs    08/20/22 0212  TSH 1.847    Recent Results (from the past 240 hour(s))  Body fluid culture w Gram Stain     Status: None (Preliminary result)   Collection Time: 08/19/22 11:14 PM   Specimen: Pleural Fluid  Result Value Ref Range Status   Specimen Description PLEURAL  Final   Special Requests NONE  Final   Gram Stain   Final    FEW WBC PRESENT,BOTH PMN AND MONONUCLEAR NO ORGANISMS SEEN Performed at [facility], [address], [place], [place] [address]    Culture PENDING  Incomplete   Report Status PENDING  Incomplete    Radiology Studies: DG CHEST PORT 1 VIEW  Result Date: 08/20/2022 CLINICAL DATA:  Bilateral pleural  effusion. EXAM: PORTABLE CHEST 1 VIEW COMPARISON:  Earlier today FINDINGS: Pleural and parenchymal opacification in the bilateral lower chest, pleural fluid and atelectasis by most recent chest CT. Cardiopericardial enlargement. There is a superimposed large hiatal hernia by CT. Transcatheter aortic valve replacement. IMPRESSION: Known bilateral pleural effusion and atelectasis. Worsening aeration at the right base. Electronically Signed   By: Tiburcio Pea M.D.   On: 08/20/2022 08:16   DG Chest Port 1 View  Result Date: 08/20/2022 CLINICAL DATA:  Status post thoracentesis EXAM: PORTABLE CHEST 1 VIEW COMPARISON:  08/19/2022 FINDINGS: Moderate left pleural effusion remains. Decreasing right pleural effusion following thoracentesis. No pneumothorax. Cardiomegaly. Status post TAVR. Left lower lobe atelectasis. IMPRESSION: Decreasing right effusion and improving right base atelectasis following thoracentesis. No pneumothorax. Continued moderate left effusion with left base atelectasis. Electronically Signed   By: Charlett Nose M.D.  On: 08/20/2022 00:19   CT Angio Chest PE W and/or Wo Contrast  Result Date: 08/19/2022 CLINICAL DATA:  Trauma bilateral effusions w/ unclear etiology. Shortness of breath, chest pain. Fall 2 weeks ago. EXAM: CT ANGIOGRAPHY CHEST WITH CONTRAST TECHNIQUE: Multidetector CT imaging of the chest was performed using the standard protocol during bolus administration of intravenous contrast. Multiplanar CT image reconstructions and MIPs were obtained to evaluate the vascular anatomy. RADIATION DOSE REDUCTION: This exam was performed according to the departmental dose-optimization program which includes automated exposure control, adjustment of the mA and/or kV according to patient size and/or use of iterative reconstruction technique. CONTRAST:  55mL OMNIPAQUE IOHEXOL 350 MG/ML SOLN COMPARISON:  03/02/2021 FINDINGS: Cardiovascular: No filling defects in the pulmonary arteries to suggest  pulmonary emboli. Mild cardiomegaly. Prior TAVR. Scattered coronary artery and aortic calcifications. No aneurysm. Mediastinum/Nodes: No mediastinal, hilar, or axillary adenopathy. Trachea and esophagus are unremarkable. Thyroid unremarkable. Lungs/Pleura: Large bilateral pleural effusions. Compressive atelectasis in the lower lobes. Small pericardial effusion. Upper Abdomen: Large hiatal hernia.  No acute findings. Musculoskeletal: Chest wall soft tissues are unremarkable. No acute bony abnormality. Review of the MIP images confirms the above findings. IMPRESSION: No evidence of pulmonary embolus. Large bilateral pleural effusions with compressive atelectasis in the lower lobes. Large hiatal hernia. Scattered coronary artery disease. Aortic Atherosclerosis (ICD10-I70.0). Electronically Signed   By: Charlett Nose M.D.   On: 08/19/2022 20:13   DG Chest Portable 1 View  Result Date: 08/19/2022 CLINICAL DATA:  Chest pain, initial encounter EXAM: PORTABLE CHEST 1 VIEW COMPARISON:  08/16/2022 FINDINGS: Cardiac shadow is stable. TAVR is again noted. Small effusions are again seen right greater than left. Mild bibasilar atelectatic changes are seen. IMPRESSION: Small effusions and bibasilar atelectasis. Electronically Signed   By: Alcide Clever M.D.   On: 08/19/2022 19:27   DG Elbow 2 Views Right  Result Date: 08/19/2022 CLINICAL DATA:  Recent fall with right elbow pain, initial encounter EXAM: RIGHT ELBOW - 2 VIEW COMPARISON:  None Available. FINDINGS: There is no evidence of fracture, dislocation, or joint effusion. There is no evidence of arthropathy or other focal bone abnormality. Soft tissues are unremarkable. IMPRESSION: No acute abnormality noted. Electronically Signed   By: Alcide Clever M.D.   On: 08/19/2022 19:26   DG Shoulder Right  Result Date: 08/19/2022 CLINICAL DATA:  Recent fall with right shoulder pain, initial encounter EXAM: RIGHT SHOULDER - 2+ VIEW COMPARISON:  None Available. FINDINGS:  Degenerative changes of the acromioclavicular joint are seen. Humeral head is high-riding related to underlying rotator cuff injury. Right-sided pleural effusion is noted. No other focal abnormality is seen. IMPRESSION: Degenerative changes of the right shoulder joint. Right pleural effusion. Electronically Signed   By: Alcide Clever M.D.   On: 08/19/2022 19:25   DG Humerus Right  Result Date: 08/19/2022 CLINICAL DATA:  Recent fall with right arm pain, initial encounter EXAM: RIGHT HUMERUS - 2+ VIEW COMPARISON:  None Available. FINDINGS: Humeral head is high-riding likely related to chronic rotator cuff injury. No acute fracture or dislocation is noted. No soft tissue abnormality is seen IMPRESSION: Chronic changes without acute abnormality. Electronically Signed   By: Alcide Clever M.D.   On: 08/19/2022 19:24        Scheduled Meds:  atorvastatin  10 mg Oral QHS   lidocaine  1 patch Transdermal Q24H   metoprolol succinate  50 mg Oral Daily   sodium chloride flush  3 mL Intravenous Q12H   Continuous Infusions:   LOS:  2 days   Time spent:  Azucena Fallen, DO [facility]  If 7PM-7AM, please contact night-coverage [email or web address]  08/21/2022, 7:51 AM

## 2022-08-21 NOTE — Plan of Care (Signed)

## 2022-08-21 NOTE — Evaluation (Signed)
Physical Therapy Evaluation Patient Details Name: Nicole Graves MRN: [id number] DOB: May 28, 1945 Today's Date: 08/21/2022  History of Present Illness  The pt is a 77 yo female presenting 7/5 with heaviness in her chest with breathing that worsened after 2 falls 2 weeks PTA. Pt found to have large bilateral pleural effusions. 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   Clinical Impression  Pt in bed upon arrival of PT, agreeable to evaluation at this time. Prior to admission the pt was mobilizing without use of DME, and is typically able to manage flight of stairs to do laundry, but reports decreased mobility and increased falls in last 2 weeks. The pt also reports a largely sedentary lifestyle outside of light cleaning and laundry, states she spends most of each day in recliner. The pt now presents with limitations in functional mobility, endurance, stability, and safety awareness, needing minA to steady in stance and BUE support for balance. The pt was able to maintain SpO2 > 90% on RA this session, but did have drop with limited mobility and may have further drop if ambulating >30 ft. The pt we discussed additional supervision, use of DME, and equipment such as life alert button to allow for best outcomes with pt returning home, she was originally resistant but agreeable by end of session. Will need continued skilled PT acutely to progress endurance and stability to allow for greater independence with transfers and short distance ambulation, will benefit from continued skilled PT after d/c to return to greatest level of independence and reduced fall risk at home.     SpO2 on RA at rest: 97% SpO2 on RA with 30 ft ambulation: 91% (pt too fatigued and dizzy to walk >30 ft, SpO2 may continue to drop with longer bouts of ambulation, recommend continuing to check as pt able to progress  distance)    Assistance Recommended at Discharge Frequent or constant Supervision/Assistance  If plan is discharge home, recommend the following:  Can travel by private vehicle  A little help with walking and/or transfers;A little help with bathing/dressing/bathroom;Assistance with cooking/housework;Assist for transportation;Help with stairs or ramp for entrance        Equipment Recommendations Other (comment) (life alert)  Recommendations for Other Services       Functional Status Assessment Patient has had a recent decline in their functional status and demonstrates the ability to make significant improvements in function in a reasonable and predictable amount of time.     Precautions / Restrictions Precautions Precautions: Fall Precaution Comments: watch BP Restrictions Weight Bearing Restrictions: No      Mobility  Bed Mobility Overal bed mobility: Needs Assistance Bed Mobility: Supine to Sit     Supine to sit: Min guard, HOB elevated     General bed mobility comments: minG with HOB elevated, no physical assist, but increased time    Transfers Overall transfer level: Needs assistance Equipment used: Rolling walker (2 wheels), None Transfers: Sit to/from Stand Sit to Stand: Min guard, Min assist           General transfer comment: minG to rise, but pt needing minA to steady in stance. able to complete with and without RW    Ambulation/Gait Ambulation/Gait assistance: Min assist Gait Distance (Feet): 25 Feet Assistive device: Rolling walker (2 wheels) Gait Pattern/deviations: Step-through pattern, Decreased stride length, Shuffle Gait velocity: decreased Gait velocity interpretation: <1.31 ft/sec, indicative of household ambulator  General Gait Details: pt with staggering steps and reaching for furniture when walking without UE support, minA with use of RW for added stability, no overt buckling but poor endurance as pt feeling dizzy     Balance  Overall balance assessment: Needs assistance Sitting-balance support: No upper extremity supported, Feet supported Sitting balance-Leahy Scale: Good     Standing balance support: Bilateral upper extremity supported, During functional activity, Reliant on assistive device for balance Standing balance-Leahy Scale: Poor                               Pertinent Vitals/Pain Pain Assessment Pain Assessment: 0-10 Pain Score: 5  Pain Location: R side from fall Pain Descriptors / Indicators: Constant Pain Intervention(s): Limited activity within patient's tolerance, Monitored during session, Repositioned    Home Living Family/patient expects to be discharged to:: Private residence Living Arrangements: Children;Other (Comment) (2 grandchildren (16 and 12yo)) Available Help at Discharge: Family;Available 24 hours/day Type of Home: House Home Access: Stairs to enter Entrance Stairs-Rails: Right Entrance Stairs-Number of Steps: 3-4 Alternate Level Stairs-Number of Steps: 15 Home Layout: Able to live on main level with bedroom/bathroom;Two level (laundry upstairs) Home Equipment: Agricultural consultant (2 wheels);Cane - single point;BSC/3in1;Rollator (4 wheels)      Prior Function Prior Level of Function : Independent/Modified Independent             Mobility Comments: pt reports no DME use, reports 3 falls in last 6 months. reports due to orhtostatics. ADLs Comments: pt no longer driving, independent with ADLs and assists her daughter's family with laundry     Hand Dominance   Dominant Hand: Right    Extremity/Trunk Assessment   Upper Extremity Assessment Upper Extremity Assessment: Generalized weakness;RUE deficits/detail;LUE deficits/detail RUE Deficits / Details: shoulder soreness following fall LUE Deficits / Details: chronic shoulder limitations, pt reports needing shoulder replacement    Lower Extremity Assessment Lower Extremity Assessment: Generalized weakness;RLE  deficits/detail RLE Deficits / Details: slightly weaker than left, especially at hip RLE Sensation: WNL RLE Coordination: WNL    Cervical / Trunk Assessment Cervical / Trunk Assessment: Normal  Communication   Communication: No difficulties  Cognition Arousal/Alertness: Awake/alert Behavior During Therapy: WFL for tasks assessed/performed Overall Cognitive Status: Within Functional Limits for tasks assessed                                 General Comments: pt slightly impuslive and needs prompting to self-monitor sx. did not report dizziness and need to urgently sit until directly asked by therapist. slight reluctance towards safety but pt eventually agreeable for measures such as use of RW and life alert        General Comments General comments (skin integrity, edema, etc.): SpO2 stable on RA, low of 91% with ambulation but pt mobility more limited by dizziness than SOB. BP stable though        Assessment/Plan    PT Assessment Patient needs continued PT services  PT Problem List Decreased activity tolerance;Decreased balance;Decreased mobility;Decreased safety awareness       PT Treatment Interventions Gait training;Stair training;DME instruction;Functional mobility training;Therapeutic exercise;Therapeutic activities;Balance training;Patient/family education    PT Goals (Current goals can be found in the Care Plan section)  Acute Rehab PT Goals Patient Stated Goal: return to independence PT Goal Formulation: With patient Time For Goal Achievement: 09/04/22 Potential to Achieve Goals: Good  Frequency Min 1X/week        AM-PAC PT "6 Clicks" Mobility  Outcome Measure Help needed turning from your back to your side while in a flat bed without using bedrails?: A Little Help needed moving from lying on your back to sitting on the side of a flat bed without using bedrails?: A Little Help needed moving to and from a bed to a chair (including a wheelchair)?:  A Little Help needed standing up from a chair using your arms (e.g., wheelchair or bedside chair)?: A Little Help needed to walk in hospital room?: A Little Help needed climbing 3-5 steps with a railing? : A Lot 6 Click Score: 17    End of Session Equipment Utilized During Treatment: Gait belt Activity Tolerance: Patient tolerated treatment well;Patient limited by fatigue Patient left: in chair;with call bell/phone within reach Nurse Communication: Mobility status PT Visit Diagnosis: Other abnormalities of gait and mobility (R26.89);Muscle weakness (generalized) (M62.81);Repeated falls (R29.6)    Time: 1191-4782 PT Time Calculation (min) (ACUTE ONLY): 30 min   Charges:   PT Evaluation $PT Eval Low Complexity: 1 Low PT Treatments $Therapeutic Exercise: 8-22 mins PT General Charges $$ ACUTE PT VISIT: 1 Visit         Vickki Muff, PT, DPT   Acute Rehabilitation Department Office [phone] Secure Chat Communication Preferred  Ronnie Derby 08/21/2022, 9:08 AM

## 2022-08-21 NOTE — Progress Notes (Signed)
Echocardiogram 2D Echocardiogram has been performed.  Nicole Graves 08/21/2022, 4:26 PM 

## 2022-08-22 ENCOUNTER — Inpatient Hospital Stay (HOSPITAL_COMMUNITY): Payer: Medicare PPO

## 2022-08-22 DIAGNOSIS — J90 Pleural effusion, not elsewhere classified: Secondary | ICD-10-CM | POA: Diagnosis not present

## 2022-08-22 LAB — CBC
HCT: 27.2 % — ABNORMAL LOW (ref 36.0–46.0)
Hemoglobin: 8.2 g/dL — ABNORMAL LOW (ref 12.0–15.0)
MCH: 28.2 pg (ref 26.0–34.0)
MCHC: 30.1 g/dL (ref 30.0–36.0)
MCV: 93.5 fL (ref 80.0–100.0)
Platelets: 495 10*3/uL — ABNORMAL HIGH (ref 150–400)
RBC: 2.91 MIL/uL — ABNORMAL LOW (ref 3.87–5.11)
RDW: 14.1 % (ref 11.5–15.5)
WBC: 8.6 10*3/uL (ref 4.0–10.5)
nRBC: 0 % (ref 0.0–0.2)

## 2022-08-22 LAB — BASIC METABOLIC PANEL
Anion gap: 12 (ref 5–15)
BUN: 21 mg/dL (ref 8–23)
CO2: 22 mmol/L (ref 22–32)
Calcium: 9 mg/dL (ref 8.9–10.3)
Chloride: 100 mmol/L (ref 98–111)
Creatinine, Ser: 1.02 mg/dL — ABNORMAL HIGH (ref 0.44–1.00)
GFR, Estimated: 56 mL/min — ABNORMAL LOW (ref >60)
Glucose, Bld: 96 mg/dL (ref 70–99)
Potassium: 3.8 mmol/L (ref 3.5–5.1)
Sodium: 134 mmol/L — ABNORMAL LOW (ref 135–145)

## 2022-08-22 LAB — BODY FLUID CULTURE W GRAM STAIN: Culture: NO GROWTH

## 2022-08-22 NOTE — TOC CM/SW Note (Signed)
Transition of Care [facility]) - Inpatient Brief Assessment   Patient Details  Name: Nicole Graves MRN: [id number] Date of Birth: Aug 09, 1945  [facility] of [facility]) CM/SW Contact:    Gala Lewandowsky, RN Phone Number: 08/22/2022, 12:10 PM   Clinical Narrative: Patient presented for shortness of breath. PTA patient states she was independent from home and lives with daughter. Patient reports that she uses no DME. Case Manager discussed home health PT and the patient declined the services-MD made aware. No further home needs identified.    Transition of Care Asessment: Insurance and Status: Insurance coverage has been reviewed Patient has primary care physician: Yes Home environment has been reviewed: reviewed Prior level of function:: independent Prior/Current Home Services: No current home services Social Determinants of Health Reivew: SDOH reviewed no interventions necessary Readmission risk has been reviewed: Yes Transition of care needs: no transition of care needs at this time

## 2022-08-22 NOTE — Care Management Important Message (Signed)
Important Message  Patient Details  Name: Nicole Graves MRN: [id number] Date of Birth: 1945-08-15   Medicare Important Message Given:  Yes     Renie Ora 08/22/2022, 8:37 AM

## 2022-08-22 NOTE — Progress Notes (Signed)
SATURATION QUALIFICATIONS: (This note is used to comply with regulatory documentation for home oxygen)  Patient Saturations on Room Air at Rest = 97%  Patient Saturations on Room Air while Ambulating = 91%  Patient Saturations on 0 Liters of oxygen while Ambulating = N/A%

## 2022-08-22 NOTE — Discharge Summary (Signed)
Physician Discharge Summary  Nicole Graves ZOX:[id number] DOB: [birth year] DOA: 08/19/2022  PCP: [name], MD  Admit date: 08/19/2022 Discharge date: 08/22/2022  Admitted From: Home Disposition:  Home  Recommendations for Outpatient Follow-up:  Follow up with PCP in 1-2 weeks Please obtain BMP/CBC in one week Please follow up on the following pending results:  Home Health: PT/OT  Equipment/Devices:None  Discharge Condition: Stable CODE STATUS: Full Diet recommendation: Low-salt low-fat low-carb diet  Brief/Interim Summary: BERTINA OGLE is a 77 y.o. female with medical history significant for SVT s/p ablation (06/27/2022), severe AS s/p TAVR 2023, CKD stage IIIa, HLD, depression/anxiety who presented to the ED for evaluation of shortness of breath.  Patient found to have bilateral pleural effusions of unknown origin, pulmonology called for thoracentesis.  Hospitalist called for admission.   Patient admitted with bilateral pleural effusions of unknown origin, status post thoracentesis to the right patient's symptoms drastically improved, her hypercarbia, hypoxia and respiratory failure essentially resolved within the first 12 hours.  With exertion patient continued to have some symptoms, continue diuresis and treatment over the next 24 hours to improve symptoms for discharge.  Echo with grade 1 diastolic dysfunction EF 60 to 81%.  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  Upon further evaluation patient indicates her tardive dyskinesia is essentially a-year-old and had been attributed to [name] at the time and has been chronic since then with no changes.  Her home medications were resumed with no changes.  Patient otherwise stable and agreeable for discharge home, follow-up with PCP and cardiology in the next 1 to 2 weeks as scheduled.  Discharge Diagnoses:  Principal Problem:   Pleural effusion, bilateral Active Problems:   S/P TAVR  (transcatheter aortic valve replacement)   Chronic kidney disease, stage 3a (HCC)   Normocytic anemia   Depression with anxiety    Discharge Instructions   Allergies as of 08/22/2022       Reactions   Sulfa Antibiotics Nausea And Vomiting, Other (See Comments)   Ativan [lorazepam] Other (See Comments)   Hyperactive    Tizanidine Hcl Other (See Comments)   Pt does not remember         Medication List     TAKE these medications    acetaminophen 650 MG CR tablet Commonly known as: TYLENOL Take 1,300 mg by mouth in the morning and at bedtime.   alendronate 70 MG tablet Commonly known as: FOSAMAX Take 1 tablet by mouth every Thursday.   aspirin EC 81 MG tablet Take 81 mg by mouth daily. Swallow whole.   atorvastatin 10 MG tablet Commonly known as: LIPITOR Take 10 mg by mouth at bedtime.   BIOFREEZE EX Apply 1 Application topically daily as needed (pain).   buPROPion 300 MG 24 hr tablet Commonly known as: WELLBUTRIN XL Take 300 mg by mouth daily after breakfast.   cyclobenzaprine 10 MG tablet Commonly known as: FLEXERIL Take 1 tablet (10 mg total) by mouth at bedtime.   DULoxetine 30 MG capsule Commonly known as: CYMBALTA Take 90 mg by mouth daily after breakfast.   famotidine 20 MG tablet Commonly known as: PEPCID Take 20 mg by mouth daily as needed for heartburn.   ferrous sulfate 325 (65 FE) MG tablet Take 325 mg by mouth daily.   gabapentin 300 MG capsule Commonly known as: NEURONTIN Take 300 mg by mouth 3 (three) times daily.   lidocaine 5 % Commonly known as: LIDODERM Place 1 patch  onto the skin daily. Remove & Discard patch within 12 hours or as directed by MD. Apply to site of pain What changed:  when to take this reasons to take this   Melatonin 5 MG Caps Take 1 capsule by mouth at bedtime.   metoprolol succinate 50 MG 24 hr tablet Commonly known as: TOPROL-XL Take 1 tablet (50 mg total) by mouth daily. Take with or immediately  following a meal. You may take an extra 50 mg dose if you experience palpitations.        Allergies  Allergen Reactions   Sulfa Antibiotics Nausea And Vomiting and Other (See Comments)   Ativan [Lorazepam] Other (See Comments)    Hyperactive    Tizanidine Hcl Other (See Comments)    Pt does not remember     Consultations: PCCM  Procedures/Studies: DG CHEST PORT 1 VIEW  Result Date: 08/22/2022 CLINICAL DATA:  142230 Pleural effusion 142230 EXAM: PORTABLE CHEST 1 VIEW COMPARISON:  CXR 08/20/22 FINDINGS: Postprocedural changes from TAVR. Small bilateral pleural effusions, unchanged from prior exam. Cardiomegaly. Hazy bibasilar airspace opacities most likely represent atelectasis, but infection is not excluded. No radiographically apparent displaced rib fractures. Visualized upper is unremarkable. IMPRESSION: Small bilateral pleural effusions and superimposed hazy airspace opacities, unchanged from prior exam. Electronically Signed   By: Lorenza Cambridge M.D.   On: 08/22/2022 08:12   ECHOCARDIOGRAM COMPLETE  Result Date: 08/21/2022    ECHOCARDIOGRAM REPORT   Patient Name:   Nicole Graves [facility] Date of Exam: 08/21/2022 Medical Rec #:  [id number]    Height:       63.0 in Accession #:    [id number]   Weight:       177.4 lb Date of Birth:  [birth year]    BSA:          1.838 m Patient Age:    77 years     BP:           115/63 mmHg Patient Gender: F            HR:           99 bpm. Exam Location:  Inpatient Procedure: 2D Echo, Cardiac Doppler and Color Doppler Indications:    Dyspnea R06.00  History:        Patient has prior history of Echocardiogram examinations, most                 recent 03/18/2022. Arrythmias:Tachycardia; Risk                 Factors:Dyslipidemia and Diabetes. CKD, stage 3.                 Aortic Valve: 29 mm CoreValve-Evolut Pro prosthetic, stented                 (TAVR) valve is present in the aortic position. Procedure Date:                 03/16/21.  Sonographer:    Lucendia Herrlich Referring  Phys: [id number] VISHAL R PATEL IMPRESSIONS  1. Left ventricular ejection fraction, by estimation, is 60 to 65%. The left ventricle has normal function. The left ventricle has no regional wall motion abnormalities. Left ventricular diastolic parameters are consistent with Grade I diastolic dysfunction (impaired relaxation).  2. Right ventricular systolic function is normal. The right ventricular size is normal. There is mildly elevated pulmonary artery systolic pressure. The estimated right ventricular systolic pressure is 42.7 mmHg.  3. The mitral valve is  degenerative. Trivial mitral valve regurgitation.  4. The aortic valve has been repaired/replaced. Aortic valve regurgitation is not visualized. There is a 29 mm CoreValve-Evolut Pro prosthetic (TAVR) valve present in the aortic position. Procedure Date: 03/16/21.  5. The inferior vena cava is normal in size with greater than 50% respiratory variability, suggesting right atrial pressure of 3 mmHg. Comparison(s): Prior images reviewed side by side. LVEF remains normal at 60-65%. TAVR stable with peak gradient 13 mmHg and no paravalvular regurgitation. FINDINGS  Left Ventricle: Left ventricular ejection fraction, by estimation, is 60 to 65%. The left ventricle has normal function. The left ventricle has no regional wall motion abnormalities. The left ventricular internal cavity size was normal in size. There is  no left ventricular hypertrophy. Left ventricular diastolic parameters are consistent with Grade I diastolic dysfunction (impaired relaxation). Right Ventricle: The right ventricular size is normal. No increase in right ventricular wall thickness. Right ventricular systolic function is normal. There is mildly elevated pulmonary artery systolic pressure. The tricuspid regurgitant velocity is 3.15  m/s, and with an assumed right atrial pressure of 3 mmHg, the estimated right ventricular systolic pressure is 42.7 mmHg. Left Atrium: Left atrial size was normal in  size. Right Atrium: Right atrial size was normal in size. Pericardium: There is no evidence of pericardial effusion. Presence of epicardial fat layer. Mitral Valve: The mitral valve is degenerative in appearance. There is mild calcification of the mitral valve leaflet(s). Trivial mitral valve regurgitation. Tricuspid Valve: The tricuspid valve is grossly normal. Tricuspid valve regurgitation is mild. Aortic Valve: The aortic valve has been repaired/replaced. Aortic valve regurgitation is not visualized. Aortic valve peak gradient measures 13.2 mmHg. There is a 29 mm CoreValve-Evolut Pro prosthetic, stented (TAVR) valve present in the aortic position.  Procedure Date: 03/16/21. Pulmonic Valve: The pulmonic valve was not well visualized. Pulmonic valve regurgitation is trivial. Aorta: The aortic root is normal in size and structure. Venous: The inferior vena cava is normal in size with greater than 50% respiratory variability, suggesting right atrial pressure of 3 mmHg. IAS/Shunts: No atrial level shunt detected by color flow Doppler.  LEFT VENTRICLE PLAX 2D LVIDd:         4.30 cm   Diastology LVIDs:         2.80 cm   LV e' medial:    10.70 cm/s LV PW:         0.70 cm   LV E/e' medial:  10.6 LV IVS:        1.00 cm   LV e' lateral:   9.64 cm/s LVOT diam:     2.00 cm   LV E/e' lateral: 11.7 LV SV:         60 LV SV Index:   33 LVOT Area:     3.14 cm  RIGHT VENTRICLE             IVC RV S prime:     12.20 cm/s  IVC diam: 1.70 cm TAPSE (M-mode): 1.0 cm LEFT ATRIUM             Index        RIGHT ATRIUM           Index LA diam:        3.20 cm 1.74 cm/m   RA Area:     15.00 cm LA Vol (A2C):   49.4 ml 26.88 ml/m  RA Volume:   36.00 ml  19.59 ml/m LA Vol (A4C):   44.5 ml 24.22 ml/m  LA Biplane Vol: 48.7 ml 26.50 ml/m  AORTIC VALVE AV Area (Vmax): 1.88 cm AV Vmax:        182.00 cm/s AV Peak Grad:   13.2 mmHg LVOT Vmax:      108.67 cm/s LVOT Vmean:     71.000 cm/s LVOT VTI:       0.191 m  AORTA Ao Root diam: 2.30 cm Ao Asc  diam:  2.20 cm MITRAL VALVE                TRICUSPID VALVE MV Area (PHT): 4.49 cm     TR Peak grad:   39.7 mmHg MV Decel Time: 169 msec     TR Vmax:        315.00 cm/s MV E velocity: 113.00 cm/s MV A velocity: 106.00 cm/s  SHUNTS MV E/A ratio:  1.07         Systemic VTI:  0.19 m                             Systemic Diam: 2.00 cm Nona Dell MD Electronically signed by Nona Dell MD Signature Date/Time: 08/21/2022/5:03:42 PM    Final    DG CHEST PORT 1 VIEW  Result Date: 08/20/2022 CLINICAL DATA:  Bilateral pleural effusion. EXAM: PORTABLE CHEST 1 VIEW COMPARISON:  Earlier today FINDINGS: Pleural and parenchymal opacification in the bilateral lower chest, pleural fluid and atelectasis by most recent chest CT. Cardiopericardial enlargement. There is a superimposed large hiatal hernia by CT. Transcatheter aortic valve replacement. IMPRESSION: Known bilateral pleural effusion and atelectasis. Worsening aeration at the right base. Electronically Signed   By: Tiburcio Pea M.D.   On: 08/20/2022 08:16   DG Chest Port 1 View  Result Date: 08/20/2022 CLINICAL DATA:  Status post thoracentesis EXAM: PORTABLE CHEST 1 VIEW COMPARISON:  08/19/2022 FINDINGS: Moderate left pleural effusion remains. Decreasing right pleural effusion following thoracentesis. No pneumothorax. Cardiomegaly. Status post TAVR. Left lower lobe atelectasis. IMPRESSION: Decreasing right effusion and improving right base atelectasis following thoracentesis. No pneumothorax. Continued moderate left effusion with left base atelectasis. Electronically Signed   By: Charlett Nose M.D.   On: 08/20/2022 00:19   CT Angio Chest PE W and/or Wo Contrast  Result Date: 08/19/2022 CLINICAL DATA:  Trauma bilateral effusions w/ unclear etiology. Shortness of breath, chest pain. Fall 2 weeks ago. EXAM: CT ANGIOGRAPHY CHEST WITH CONTRAST TECHNIQUE: Multidetector CT imaging of the chest was performed using the standard protocol during bolus administration of  intravenous contrast. Multiplanar CT image reconstructions and MIPs were obtained to evaluate the vascular anatomy. RADIATION DOSE REDUCTION: This exam was performed according to the departmental dose-optimization program which includes automated exposure control, adjustment of the mA and/or kV according to patient size and/or use of iterative reconstruction technique. CONTRAST:  55mL OMNIPAQUE IOHEXOL 350 MG/ML SOLN COMPARISON:  03/02/2021 FINDINGS: Cardiovascular: No filling defects in the pulmonary arteries to suggest pulmonary emboli. Mild cardiomegaly. Prior TAVR. Scattered coronary artery and aortic calcifications. No aneurysm. Mediastinum/Nodes: No mediastinal, hilar, or axillary adenopathy. Trachea and esophagus are unremarkable. Thyroid unremarkable. Lungs/Pleura: Large bilateral pleural effusions. Compressive atelectasis in the lower lobes. Small pericardial effusion. Upper Abdomen: Large hiatal hernia.  No acute findings. Musculoskeletal: Chest wall soft tissues are unremarkable. No acute bony abnormality. Review of the MIP images confirms the above findings. IMPRESSION: No evidence of pulmonary embolus. Large bilateral pleural effusions with compressive atelectasis in the lower lobes. Large hiatal hernia. Scattered coronary artery disease.  Aortic Atherosclerosis (ICD10-I70.0). Electronically Signed   By: Charlett Nose M.D.   On: 08/19/2022 20:13   DG Chest Portable 1 View  Result Date: 08/19/2022 CLINICAL DATA:  Chest pain, initial encounter EXAM: PORTABLE CHEST 1 VIEW COMPARISON:  08/16/2022 FINDINGS: Cardiac shadow is stable. TAVR is again noted. Small effusions are again seen right greater than left. Mild bibasilar atelectatic changes are seen. IMPRESSION: Small effusions and bibasilar atelectasis. Electronically Signed   By: Alcide Clever M.D.   On: 08/19/2022 19:27   DG Elbow 2 Views Right  Result Date: 08/19/2022 CLINICAL DATA:  Recent fall with right elbow pain, initial encounter EXAM: RIGHT  ELBOW - 2 VIEW COMPARISON:  None Available. FINDINGS: There is no evidence of fracture, dislocation, or joint effusion. There is no evidence of arthropathy or other focal bone abnormality. Soft tissues are unremarkable. IMPRESSION: No acute abnormality noted. Electronically Signed   By: Alcide Clever M.D.   On: 08/19/2022 19:26   DG Shoulder Right  Result Date: 08/19/2022 CLINICAL DATA:  Recent fall with right shoulder pain, initial encounter EXAM: RIGHT SHOULDER - 2+ VIEW COMPARISON:  None Available. FINDINGS: Degenerative changes of the acromioclavicular joint are seen. Humeral head is high-riding related to underlying rotator cuff injury. Right-sided pleural effusion is noted. No other focal abnormality is seen. IMPRESSION: Degenerative changes of the right shoulder joint. Right pleural effusion. Electronically Signed   By: Alcide Clever M.D.   On: 08/19/2022 19:25   DG Humerus Right  Result Date: 08/19/2022 CLINICAL DATA:  Recent fall with right arm pain, initial encounter EXAM: RIGHT HUMERUS - 2+ VIEW COMPARISON:  None Available. FINDINGS: Humeral head is high-riding likely related to chronic rotator cuff injury. No acute fracture or dislocation is noted. No soft tissue abnormality is seen IMPRESSION: Chronic changes without acute abnormality. Electronically Signed   By: Alcide Clever M.D.   On: 08/19/2022 19:24   DG Chest 2 View  Result Date: 08/19/2022 CLINICAL DATA:  Fall 2 weeks ago with shortness of breath EXAM: CHEST - 2 VIEW COMPARISON:  06/11/2022 FINDINGS: Cardiac shadow is stable. TAVR is again seen. Lungs are well aerated bilaterally with small effusions posteriorly. No focal infiltrate is seen. IMPRESSION: Small effusions bilaterally. Electronically Signed   By: Alcide Clever M.D.   On: 08/19/2022 17:15     Subjective: No acute issues/events overnight - respiratory status markedly improved   Discharge Exam: Vitals:   08/22/22 0345 08/22/22 0908  BP: 116/67 113/72  Pulse: 90 97  Resp:  17   Temp: 98.1 F (36.7 C)   SpO2: 97%    Vitals:   08/21/22 1128 08/21/22 2048 08/22/22 0345 08/22/22 0908  BP: 107/71 122/61 116/67 113/72  Pulse:  99 90 97  Resp:  20 17   Temp: 98.2 F (36.8 C) 99.1 F (37.3 C) 98.1 F (36.7 C)   TempSrc: Oral Oral Oral   SpO2:  96% 97%   Weight:      Height:       General: Pt is alert, awake, not in acute distress Cardiovascular: RRR, S1/S2 +, no rubs, no gallops Respiratory: L rales ongoing, scant rales on R Abdominal: Soft, NT, ND, bowel sounds + Extremities: no edema, no cyanosis   The results of significant diagnostics from this hospitalization (including imaging, microbiology, ancillary and laboratory) are listed below for reference.     Microbiology: Recent Results (from the past 240 hour(s))  Body fluid culture w Gram Stain     Status: None (Preliminary  result)   Collection Time: 08/19/22 11:14 PM   Specimen: Pleural Fluid  Result Value Ref Range Status   Specimen Description PLEURAL  Final   Special Requests NONE  Final   Gram Stain   Final    FEW WBC PRESENT,BOTH PMN AND MONONUCLEAR NO ORGANISMS SEEN    Culture   Final    NO GROWTH 2 DAYS Performed at [facility], [address] [place], [place] [address]    Report Status PENDING  Incomplete   Labs: BNP (last 3 results) Recent Labs    08/20/22 0020  BNP 302.5*   Basic Metabolic Panel: Recent Labs  Lab 08/19/22 1647 08/20/22 0212 08/21/22 0233 08/22/22 0304  NA 136 130* 134* 134*  K 4.4 3.5 3.8 3.8  CL 100 98 101 100  CO2 24 21* 22 22  GLUCOSE 113* 102* 109* 96  BUN 18 16 20 21   CREATININE 1.11* 1.15* 1.10* 1.02*  CALCIUM 9.1 8.1* 8.4* 9.0  MG  --  1.7  --   --    Liver Function Tests: Recent Labs  Lab 08/20/22 0020 08/20/22 0212  AST  --  13*  ALT  --  10  ALKPHOS  --  81  BILITOT  --  1.1  PROT 6.0* 5.7*  ALBUMIN  --  2.2*   No results for input(s): "LIPASE", "AMYLASE" in the last 168 hours. No results for input(s): "AMMONIA"  in the last 168 hours. CBC: Recent Labs  Lab 08/19/22 1647 08/20/22 0212 08/21/22 0227 08/21/22 0233 08/22/22 0304  WBC 15.0* 13.0* 11.2* 10.9* 8.6  NEUTROABS  --   --  8.9*  --   --   HGB 9.1* 7.7* 8.1* 8.1* 8.2*  HCT 30.3* 24.8* 26.6* 25.8* 27.2*  MCV 96.2 92.9 92.7 90.5 93.5  PLT 523* 430* 477* 469* 495*   Thyroid function studies Recent Labs    08/20/22 0212  TSH 1.847   Urinalysis    Component Value Date/Time   COLORURINE YELLOW 09/22/2021 1716   APPEARANCEUR CLEAR 09/22/2021 1716   LABSPEC 1.008 09/22/2021 1716   PHURINE 5.0 09/22/2021 1716   GLUCOSEU NEGATIVE 09/22/2021 1716   HGBUR NEGATIVE 09/22/2021 1716   BILIRUBINUR NEGATIVE 09/22/2021 1716   KETONESUR NEGATIVE 09/22/2021 1716   PROTEINUR NEGATIVE 09/22/2021 1716   UROBILINOGEN 0.2 10/08/2008 1021   NITRITE NEGATIVE 09/22/2021 1716   LEUKOCYTESUR NEGATIVE 09/22/2021 1716   Sepsis Labs Recent Labs  Lab 08/20/22 0212 08/21/22 0227 08/21/22 0233 08/22/22 0304  WBC 13.0* 11.2* 10.9* 8.6   Microbiology Recent Results (from the past 240 hour(s))  Body fluid culture w Gram Stain     Status: None (Preliminary result)   Collection Time: 08/19/22 11:14 PM   Specimen: Pleural Fluid  Result Value Ref Range Status   Specimen Description PLEURAL  Final   Special Requests NONE  Final   Gram Stain   Final    FEW WBC PRESENT,BOTH PMN AND MONONUCLEAR NO ORGANISMS SEEN    Culture   Final    NO GROWTH 2 DAYS Performed at [facility], [address], [place], [place] [address]    Report Status PENDING  Incomplete   Time coordinating discharge: Over 30 minutes  SIGNED:  Azucena Fallen, DO [facility] 08/22/2022, 10:00 AM Pager   If 7PM-7AM, please contact night-coverage [email or web address]

## 2022-08-22 NOTE — Evaluation (Signed)
Occupational Therapy Evaluation Patient Details Name: Nicole Graves MRN: [id number] DOB: 1945/04/27 Today's Date: 08/22/2022   History of Present Illness The pt is a 77 yo female presenting 7/5 with heaviness in her chest with breathing that worsened after 2 falls 2 weeks PTA. Pt found to have large bilateral pleural effusions. 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   Clinical Impression   Patient sitting upright in bedside chair upon arrival into room and agreeable to participate in OT evaluation.  Patient lives with daughter and her family in 2 level home but resides on main level.  Patient reports being Independent in ADLs and IADLs except for driving.  Patient educated on fall prevention and using reacher for item retrieval off ground along with making sure to pause initially during  transition periods from supine to sit and sit to stand before competing functional mobility to decrease risk fo further falls.  Patient verbalizes understanding of given education.     Recommendations for follow up therapy are one component of a multi-disciplinary discharge planning process, led by the attending physician.  Recommendations may be updated based on patient status, additional functional criteria and insurance authorization.   Assistance Recommended at Discharge None  Patient can return home with the following Assist for transportation (patient educated on using life alert (PT already educating patient regarding this) along with importance of taking her time and not picking items off floor and instead using reacher to prevent patient from tipping forward since patient had known falls)    Functional Status Assessment  Patient has had a recent decline in their functional status and demonstrates the ability to make significant improvements in function in a reasonable and  predictable amount of time.  Equipment Recommendations       Recommendations for Other Services       Precautions / Restrictions Precautions Precautions: Fall Precaution Comments: watch BP Restrictions Weight Bearing Restrictions: No      Mobility Bed Mobility                    Transfers Overall transfer level: Independent Equipment used: None Transfers: Sit to/from Stand Sit to Stand: Independent                  Balance Overall balance assessment: Independent Sitting-balance support: Feet supported                                       ADL either performed or assessed with clinical judgement   ADL Overall ADL's : Independent                                             Vision Patient Visual Report: No change from baseline       Perception     Praxis      Pertinent Vitals/Pain Pain Assessment Pain Assessment: 0-10 Pain Score: 6  Pain Location: R side from fall Pain Descriptors / Indicators: Constant, Throbbing Pain Intervention(s): Monitored during session     Hand Dominance Right   Extremity/Trunk Assessment Upper Extremity Assessment Upper Extremity Assessment: Overall WFL for tasks assessed RUE Deficits / Details: reports R shoulder soreness but able to complete ROM  in all functional planes   Lower Extremity Assessment RLE Sensation: WNL RLE Coordination: WNL   Cervical / Trunk Assessment Cervical / Trunk Assessment: Normal   Communication Communication Communication: No difficulties   Cognition Arousal/Alertness: Awake/alert Behavior During Therapy: WFL for tasks assessed/performed Overall Cognitive Status: Within Functional Limits for tasks assessed                                 General Comments: generalized decreased safety.  Patient educated on slowing down and also not inititally start walking post standing and to wait a minute or two before mobilixing self to assess  dizziness or how her body feels to help prevent further falls     General Comments       Exercises     Shoulder Instructions      Home Living Family/patient expects to be discharged to:: Private residence Living Arrangements: Children;Other (Comment) Available Help at Discharge: Family;Available 24 hours/day Type of Home: House Home Access: Stairs to enter [employer] of Steps: 3-4 (from garage) Entrance Stairs-Rails: Right Home Layout: Able to live on main level with bedroom/bathroom;Two level Alternate Level Stairs-Number of Steps: 15 (to laundry) Alternate Level Stairs-Rails: Left Bathroom Shower/Tub: Chief Strategy Officer: Standard     Home Equipment: Agricultural consultant (2 wheels);Cane - single point;BSC/3in1;Rollator (4 wheels)          Prior Functioning/Environment Prior Level of Function : Independent/Modified Independent             Mobility Comments: pt reports no DME use, reports 3 falls in last 6 months. reports due to orhtostatics. ADLs Comments: pt no longer driving, independent with ADLs and assists her daughter's family with laundry        OT Problem List:        OT Treatment/Interventions:      OT Goals(Current goals can be found in the care plan section) Acute Rehab OT Goals OT Goal Formulation: All assessment and education complete, DC therapy Time For Goal Achievement: 08/22/22 Potential to Achieve Goals: Good  OT Frequency:      Co-evaluation              AM-PAC OT "6 Clicks" Daily Activity     Outcome Measure Help from another person eating meals?: None Help from another person taking care of personal grooming?: None Help from another person toileting, which includes using toliet, bedpan, or urinal?: None Help from another person bathing (including washing, rinsing, drying)?: None Help from another person to put on and taking off regular upper body clothing?: None Help from another person to put on and  taking off regular lower body clothing?: None 6 Click Score: 24   End of Session Equipment Utilized During Treatment: Gait belt Nurse Communication: Mobility status  Activity Tolerance: Patient tolerated treatment well Patient left: in chair;with call bell/phone within reach  OT Visit Diagnosis: Unsteadiness on feet (R26.81)                Time: 0981-1914 OT Time Calculation (min): 16 min Charges:  OT Evaluation $OT Eval Low Complexity: 1 Low  Governor Specking OT/L  Denice Paradise 08/22/2022, 1:06 PM

## 2022-08-23 LAB — BODY FLUID CULTURE W GRAM STAIN

## 2022-08-24 LAB — CYTOLOGY - NON PAP

## 2022-08-29 ENCOUNTER — Other Ambulatory Visit: Payer: Self-pay

## 2022-08-29 ENCOUNTER — Telehealth: Payer: Self-pay | Admitting: Cardiology

## 2022-08-29 ENCOUNTER — Encounter (HOSPITAL_COMMUNITY): Payer: Self-pay | Admitting: Internal Medicine

## 2022-08-29 ENCOUNTER — Inpatient Hospital Stay (HOSPITAL_COMMUNITY)
Admission: EM | Admit: 2022-08-29 | Discharge: 2022-09-09 | DRG: 291 | Disposition: A | Discharge status: Home Health | Payer: Medicare PPO | Attending: Family Medicine | Admitting: Family Medicine

## 2022-08-29 ENCOUNTER — Emergency Department (HOSPITAL_COMMUNITY): Payer: Medicare PPO

## 2022-08-29 DIAGNOSIS — T501X5A Adverse effect of loop [high-ceiling] diuretics, initial encounter: Secondary | ICD-10-CM | POA: Diagnosis not present

## 2022-08-29 DIAGNOSIS — I5033 Acute on chronic diastolic (congestive) heart failure: Secondary | ICD-10-CM | POA: Diagnosis present

## 2022-08-29 DIAGNOSIS — Z7982 Long term (current) use of aspirin: Secondary | ICD-10-CM | POA: Diagnosis not present

## 2022-08-29 DIAGNOSIS — K219 Gastro-esophageal reflux disease without esophagitis: Secondary | ICD-10-CM | POA: Diagnosis present

## 2022-08-29 DIAGNOSIS — Z8249 Family history of ischemic heart disease and other diseases of the circulatory system: Secondary | ICD-10-CM | POA: Diagnosis not present

## 2022-08-29 DIAGNOSIS — Z79899 Other long term (current) drug therapy: Secondary | ICD-10-CM

## 2022-08-29 DIAGNOSIS — E8809 Other disorders of plasma-protein metabolism, not elsewhere classified: Secondary | ICD-10-CM | POA: Diagnosis not present

## 2022-08-29 DIAGNOSIS — Z9181 History of falling: Secondary | ICD-10-CM

## 2022-08-29 DIAGNOSIS — E785 Hyperlipidemia, unspecified: Secondary | ICD-10-CM | POA: Diagnosis present

## 2022-08-29 DIAGNOSIS — F32A Depression, unspecified: Secondary | ICD-10-CM | POA: Diagnosis present

## 2022-08-29 DIAGNOSIS — F419 Anxiety disorder, unspecified: Secondary | ICD-10-CM | POA: Diagnosis present

## 2022-08-29 DIAGNOSIS — E1122 Type 2 diabetes mellitus with diabetic chronic kidney disease: Secondary | ICD-10-CM | POA: Diagnosis present

## 2022-08-29 DIAGNOSIS — N1832 Chronic kidney disease, stage 3b: Secondary | ICD-10-CM | POA: Diagnosis not present

## 2022-08-29 DIAGNOSIS — I700 Atherosclerosis of aorta: Secondary | ICD-10-CM | POA: Diagnosis not present

## 2022-08-29 DIAGNOSIS — I130 Hypertensive heart and chronic kidney disease with heart failure and stage 1 through stage 4 chronic kidney disease, or unspecified chronic kidney disease: Principal | ICD-10-CM | POA: Diagnosis present

## 2022-08-29 DIAGNOSIS — E669 Obesity, unspecified: Secondary | ICD-10-CM | POA: Diagnosis present

## 2022-08-29 DIAGNOSIS — K449 Diaphragmatic hernia without obstruction or gangrene: Secondary | ICD-10-CM | POA: Diagnosis not present

## 2022-08-29 DIAGNOSIS — E876 Hypokalemia: Secondary | ICD-10-CM | POA: Diagnosis not present

## 2022-08-29 DIAGNOSIS — N179 Acute kidney failure, unspecified: Secondary | ICD-10-CM | POA: Diagnosis not present

## 2022-08-29 DIAGNOSIS — Z953 Presence of xenogenic heart valve: Secondary | ICD-10-CM

## 2022-08-29 DIAGNOSIS — Z800 Family history of malignant neoplasm of digestive organs: Secondary | ICD-10-CM

## 2022-08-29 DIAGNOSIS — D509 Iron deficiency anemia, unspecified: Secondary | ICD-10-CM | POA: Diagnosis not present

## 2022-08-29 DIAGNOSIS — Z9049 Acquired absence of other specified parts of digestive tract: Secondary | ICD-10-CM

## 2022-08-29 DIAGNOSIS — Z7983 Long term (current) use of bisphosphonates: Secondary | ICD-10-CM | POA: Diagnosis not present

## 2022-08-29 DIAGNOSIS — Z882 Allergy status to sulfonamides status: Secondary | ICD-10-CM

## 2022-08-29 DIAGNOSIS — E1140 Type 2 diabetes mellitus with diabetic neuropathy, unspecified: Secondary | ICD-10-CM | POA: Diagnosis present

## 2022-08-29 DIAGNOSIS — J984 Other disorders of lung: Secondary | ICD-10-CM | POA: Diagnosis not present

## 2022-08-29 DIAGNOSIS — Z09 Encounter for follow-up examination after completed treatment for conditions other than malignant neoplasm: Secondary | ICD-10-CM | POA: Diagnosis not present

## 2022-08-29 DIAGNOSIS — Z888 Allergy status to other drugs, medicaments and biological substances status: Secondary | ICD-10-CM

## 2022-08-29 DIAGNOSIS — Z91148 Patient's other noncompliance with medication regimen for other reason: Secondary | ICD-10-CM

## 2022-08-29 DIAGNOSIS — I471 Supraventricular tachycardia, unspecified: Secondary | ICD-10-CM | POA: Diagnosis not present

## 2022-08-29 DIAGNOSIS — I082 Rheumatic disorders of both aortic and tricuspid valves: Secondary | ICD-10-CM | POA: Diagnosis not present

## 2022-08-29 DIAGNOSIS — I4891 Unspecified atrial fibrillation: Secondary | ICD-10-CM | POA: Diagnosis present

## 2022-08-29 DIAGNOSIS — J90 Pleural effusion, not elsewhere classified: Secondary | ICD-10-CM | POA: Diagnosis present

## 2022-08-29 DIAGNOSIS — I517 Cardiomegaly: Secondary | ICD-10-CM | POA: Diagnosis not present

## 2022-08-29 DIAGNOSIS — J9811 Atelectasis: Secondary | ICD-10-CM | POA: Diagnosis not present

## 2022-08-29 DIAGNOSIS — R0600 Dyspnea, unspecified: Principal | ICD-10-CM

## 2022-08-29 DIAGNOSIS — Z6830 Body mass index (BMI) 30.0-30.9, adult: Secondary | ICD-10-CM

## 2022-08-29 DIAGNOSIS — Z8719 Personal history of other diseases of the digestive system: Secondary | ICD-10-CM

## 2022-08-29 DIAGNOSIS — T501X6A Underdosing of loop [high-ceiling] diuretics, initial encounter: Secondary | ICD-10-CM | POA: Diagnosis present

## 2022-08-29 DIAGNOSIS — I2510 Atherosclerotic heart disease of native coronary artery without angina pectoris: Secondary | ICD-10-CM | POA: Diagnosis not present

## 2022-08-29 DIAGNOSIS — I951 Orthostatic hypotension: Secondary | ICD-10-CM | POA: Diagnosis not present

## 2022-08-29 DIAGNOSIS — R918 Other nonspecific abnormal finding of lung field: Secondary | ICD-10-CM | POA: Diagnosis not present

## 2022-08-29 DIAGNOSIS — D649 Anemia, unspecified: Secondary | ICD-10-CM | POA: Diagnosis not present

## 2022-08-29 DIAGNOSIS — R0602 Shortness of breath: Secondary | ICD-10-CM | POA: Diagnosis present

## 2022-08-29 DIAGNOSIS — Z6832 Body mass index (BMI) 32.0-32.9, adult: Secondary | ICD-10-CM | POA: Diagnosis not present

## 2022-08-29 DIAGNOSIS — Z96653 Presence of artificial knee joint, bilateral: Secondary | ICD-10-CM | POA: Diagnosis present

## 2022-08-29 DIAGNOSIS — R490 Dysphonia: Secondary | ICD-10-CM | POA: Diagnosis not present

## 2022-08-29 LAB — CBC WITH DIFFERENTIAL/PLATELET
Abs Immature Granulocytes: 0.07 10*3/uL (ref 0.00–0.07)
Basophils Absolute: 0.1 10*3/uL (ref 0.0–0.1)
Basophils Relative: 1 %
Eosinophils Absolute: 0.4 10*3/uL (ref 0.0–0.5)
Eosinophils Relative: 4 %
HCT: 29.8 % — ABNORMAL LOW (ref 36.0–46.0)
Hemoglobin: 8.8 g/dL — ABNORMAL LOW (ref 12.0–15.0)
Immature Granulocytes: 1 %
Lymphocytes Relative: 19 %
Lymphs Abs: 1.8 10*3/uL (ref 0.7–4.0)
MCH: 28.1 pg (ref 26.0–34.0)
MCHC: 29.5 g/dL — ABNORMAL LOW (ref 30.0–36.0)
MCV: 95.2 fL (ref 80.0–100.0)
Monocytes Absolute: 0.7 10*3/uL (ref 0.1–1.0)
Monocytes Relative: 8 %
Neutro Abs: 6.5 10*3/uL (ref 1.7–7.7)
Neutrophils Relative %: 67 %
Platelets: 668 10*3/uL — ABNORMAL HIGH (ref 150–400)
RBC: 3.13 MIL/uL — ABNORMAL LOW (ref 3.87–5.11)
RDW: 14.5 % (ref 11.5–15.5)
WBC: 9.6 10*3/uL (ref 4.0–10.5)
nRBC: 0 % (ref 0.0–0.2)

## 2022-08-29 LAB — COMPREHENSIVE METABOLIC PANEL
ALT: 11 U/L (ref 0–44)
AST: 14 U/L — ABNORMAL LOW (ref 15–41)
Albumin: 2.4 g/dL — ABNORMAL LOW (ref 3.5–5.0)
Alkaline Phosphatase: 84 U/L (ref 38–126)
Anion gap: 14 (ref 5–15)
BUN: 18 mg/dL (ref 8–23)
CO2: 24 mmol/L (ref 22–32)
Calcium: 8.7 mg/dL — ABNORMAL LOW (ref 8.9–10.3)
Chloride: 101 mmol/L (ref 98–111)
Creatinine, Ser: 0.97 mg/dL (ref 0.44–1.00)
GFR, Estimated: 59 mL/min — ABNORMAL LOW (ref >60)
Glucose, Bld: 102 mg/dL — ABNORMAL HIGH (ref 70–99)
Potassium: 3.9 mmol/L (ref 3.5–5.1)
Sodium: 139 mmol/L (ref 135–145)
Total Bilirubin: 0.4 mg/dL (ref 0.3–1.2)
Total Protein: 6.2 g/dL — ABNORMAL LOW (ref 6.5–8.1)

## 2022-08-29 LAB — TROPONIN I (HIGH SENSITIVITY)
Troponin I (High Sensitivity): 19 ng/L — ABNORMAL HIGH (ref <18)
Troponin I (High Sensitivity): 20 ng/L — ABNORMAL HIGH (ref <18)

## 2022-08-29 LAB — BRAIN NATRIURETIC PEPTIDE: B Natriuretic Peptide: 163.5 pg/mL — ABNORMAL HIGH (ref 0.0–100.0)

## 2022-08-29 MED ORDER — FAMOTIDINE 20 MG PO TABS
20.0000 mg | ORAL_TABLET | Freq: Every day | ORAL | Status: DC | PRN
  Administered 2022-08-30 – 2022-09-09 (×7): 20 mg via ORAL
  Filled 2022-08-29 (×7): qty 1

## 2022-08-29 MED ORDER — DULOXETINE HCL 60 MG PO CPEP
90.0000 mg | ORAL_CAPSULE | Freq: Every day | ORAL | Status: DC
  Administered 2022-08-30 – 2022-09-09 (×11): 90 mg via ORAL
  Filled 2022-08-29 (×11): qty 1

## 2022-08-29 MED ORDER — ENOXAPARIN SODIUM 40 MG/0.4ML IJ SOSY
40.0000 mg | PREFILLED_SYRINGE | INTRAMUSCULAR | Status: DC
  Administered 2022-08-29 – 2022-09-08 (×11): 40 mg via SUBCUTANEOUS
  Filled 2022-08-29 (×11): qty 0.4

## 2022-08-29 MED ORDER — GABAPENTIN 300 MG PO CAPS
300.0000 mg | ORAL_CAPSULE | Freq: Three times a day (TID) | ORAL | Status: DC
  Administered 2022-08-29 – 2022-09-09 (×32): 300 mg via ORAL
  Filled 2022-08-29 (×32): qty 1

## 2022-08-29 MED ORDER — ATORVASTATIN CALCIUM 10 MG PO TABS
10.0000 mg | ORAL_TABLET | Freq: Every day | ORAL | Status: DC
  Administered 2022-08-29 – 2022-09-08 (×11): 10 mg via ORAL
  Filled 2022-08-29 (×11): qty 1

## 2022-08-29 MED ORDER — FERROUS SULFATE 325 (65 FE) MG PO TABS
325.0000 mg | ORAL_TABLET | Freq: Every day | ORAL | Status: DC
  Administered 2022-08-30 – 2022-09-09 (×11): 325 mg via ORAL
  Filled 2022-08-29 (×11): qty 1

## 2022-08-29 MED ORDER — BUPROPION HCL 100 MG PO TABS
100.0000 mg | ORAL_TABLET | Freq: Every day | ORAL | Status: DC
  Administered 2022-08-30 – 2022-09-09 (×11): 100 mg via ORAL
  Filled 2022-08-29 (×12): qty 1

## 2022-08-29 MED ORDER — ASPIRIN 81 MG PO TBEC
81.0000 mg | DELAYED_RELEASE_TABLET | Freq: Every day | ORAL | Status: DC
  Administered 2022-08-30 – 2022-09-09 (×11): 81 mg via ORAL
  Filled 2022-08-29 (×11): qty 1

## 2022-08-29 MED ORDER — FUROSEMIDE 10 MG/ML IJ SOLN
20.0000 mg | Freq: Two times a day (BID) | INTRAMUSCULAR | Status: AC
  Administered 2022-08-29 – 2022-08-30 (×2): 20 mg via INTRAVENOUS
  Filled 2022-08-29 (×2): qty 2

## 2022-08-29 MED ORDER — MIDODRINE HCL 5 MG PO TABS
5.0000 mg | ORAL_TABLET | ORAL | Status: AC
  Administered 2022-08-29: 5 mg via ORAL
  Filled 2022-08-29: qty 1

## 2022-08-29 MED ORDER — IPRATROPIUM-ALBUTEROL 0.5-2.5 (3) MG/3ML IN SOLN
3.0000 mL | RESPIRATORY_TRACT | Status: AC
  Administered 2022-08-29: 3 mL via RESPIRATORY_TRACT
  Filled 2022-08-29: qty 3

## 2022-08-29 NOTE — ED Provider Notes (Signed)
[employer] EMERGENCY DEPARTMENT AT [facility] Provider Note   CSN: [id number] Arrival date & time: 08/29/22  1704     History  Chief Complaint  Patient presents with   Shortness of Breath    Nicole Graves is a 77 y.o. female.  77 year old female with a history of SVT status post ablation, severe aortic stenosis, CKD, and hyperlipidemia who presents to the emergency department with shortness of breath.  Patient reports that she was recently admitted and discharged on 7 8/24 for a pleural effusion.  She had it drained which showed exudative effusion and they are concerned that a possible recent fall would be the most likely etiology for it.  Says that since being discharged she has not had any change in her symptoms at all aside from slight worsening of her shortness of breath over the past few days.  An x-ray at her primary care doctor's office that showed worsening effusion who referred her back to the emergency department for evaluation.  Has not been taking her Lasix since discharge since she had to be taken off of it due to orthostasis.       Home Medications Prior to Admission medications   Medication Sig Start Date End Date Taking? Authorizing Provider  acetaminophen (TYLENOL) 650 MG CR tablet Take 1,300 mg by mouth in the morning and at bedtime.   Yes [provider]  alendronate (FOSAMAX) 70 MG tablet Take 70 mg by mouth every Thursday.   Yes [provider]  aspirin EC 81 MG tablet Take 81 mg by mouth daily. Swallow whole.   Yes [provider]  atorvastatin (LIPITOR) 10 MG tablet Take 10 mg by mouth at bedtime.  02/11/15  Yes [provider]  buPROPion (WELLBUTRIN) 100 MG tablet Take 100 mg by mouth daily.   Yes [provider]  cyclobenzaprine (FLEXERIL) 10 MG tablet Take 1 tablet (10 mg total) by mouth at bedtime. 04/14/22  Yes Medina-Vargas, Monina C, NP  DULoxetine (CYMBALTA) 30 MG capsule Take 90 mg by mouth daily  after breakfast.   Yes [provider]  famotidine (PEPCID) 20 MG tablet Take 20 mg by mouth daily as needed for heartburn.   Yes [provider]  ferrous sulfate 325 (65 FE) MG tablet Take 325 mg by mouth daily.   Yes [provider]  gabapentin (NEURONTIN) 300 MG capsule Take 300 mg by mouth 3 (three) times daily. 12/20/19  Yes [provider]  Melatonin 5 MG CAPS Take 1 capsule by mouth at bedtime.   Yes [provider]  metoprolol succinate (TOPROL-XL) 50 MG 24 hr tablet Take 1 tablet (50 mg total) by mouth daily. Take with or immediately following a meal. You may take an extra 50 mg dose if you experience palpitations. 04/20/22  Yes Meriam Sprague, MD  lidocaine (LIDODERM) 5 % Place 1 patch onto the skin daily. Remove & Discard patch within 12 hours or as directed by MD. Apply to site of pain Patient not taking: Reported on 08/29/2022 09/27/21   Pokhrel, Rebekah Chesterfield, MD      Allergies    Sulfa antibiotics, Ativan [lorazepam], and Tizanidine hcl    Review of Systems   Review of Systems  Physical Exam Updated Vital Signs BP (!) 148/81   Pulse 87   Temp (!) 97.3 F (36.3 C)   Resp (!) 24   SpO2 99%  Physical Exam Vitals and nursing note reviewed.  Constitutional:  General: She is not in acute distress.    Appearance: She is well-developed.  HENT:     Head: Normocephalic and atraumatic.     Right Ear: External ear normal.     Left Ear: External ear normal.     Nose: Nose normal.  Eyes:     Extraocular Movements: Extraocular movements intact.     Conjunctiva/sclera: Conjunctivae normal.     Pupils: Pupils are equal, round, and reactive to light.  Cardiovascular:     Rate and Rhythm: Normal rate and regular rhythm.     Heart sounds: Murmur heard.  Pulmonary:     Effort: Pulmonary effort is normal. No respiratory distress.     Comments: Diminished bilaterally Musculoskeletal:     Cervical back: Normal range of motion and neck  supple.     Right lower leg: No edema.     Left lower leg: No edema.  Skin:    General: Skin is warm and dry.  Neurological:     Mental Status: She is alert and oriented to person, place, and time. Mental status is at baseline.  Psychiatric:        Mood and Affect: Mood normal.     ED Results / Procedures / Treatments   Labs (all labs ordered are listed, but only abnormal results are displayed) Labs Reviewed  BRAIN NATRIURETIC PEPTIDE - Abnormal; Notable for the following components:      Result Value   B Natriuretic Peptide 163.5 (*)    All other components within normal limits  CBC WITH DIFFERENTIAL/PLATELET - Abnormal; Notable for the following components:   RBC 3.13 (*)    Hemoglobin 8.8 (*)    HCT 29.8 (*)    MCHC 29.5 (*)    Platelets 668 (*)    All other components within normal limits  COMPREHENSIVE METABOLIC PANEL - Abnormal; Notable for the following components:   Glucose, Bld 102 (*)    Calcium 8.7 (*)    Total Protein 6.2 (*)    Albumin 2.4 (*)    AST 14 (*)    GFR, Estimated 59 (*)    All other components within normal limits  TROPONIN I (HIGH SENSITIVITY) - Abnormal; Notable for the following components:   Troponin I (High Sensitivity) 19 (*)    All other components within normal limits  TROPONIN I (HIGH SENSITIVITY) - Abnormal; Notable for the following components:   Troponin I (High Sensitivity) 20 (*)    All other components within normal limits    EKG None  Radiology DG Chest 2 View  Result Date: 08/29/2022 CLINICAL DATA:  Shortness of breath. EXAM: CHEST - 2 VIEW COMPARISON:  Same day. FINDINGS: Stable cardiomegaly. Status post transcatheter aortic valve repair. Bilateral pleural effusions are noted with associated bibasilar atelectasis, left greater than right. Degenerative changes seen involving the left glenohumeral joint. IMPRESSION: Bilateral pleural effusions are noted with associated bibasilar atelectasis, left greater than right.  Electronically Signed   By: Lupita Raider M.D.   On: 08/29/2022 18:16    Procedures Procedures    Medications Ordered in ED Medications - No data to display  ED Course/ Medical Decision Making/ A&P Clinical Course as of 08/29/22 2139  Mon Aug 29, 2022  2111 Dr. Celine Mans from pulmonology consulted.  Recommends diuresis for the patient and they will see them in the morning. [RP]  2113 Troponin I (High Sensitivity)(!): 19 [RP]  2133 Dr Margo Aye for the hospitalist to admit. [RP]    Clinical Course  User Index [RP] Rondel Baton, MD                             Medical Decision Making Amount and/or Complexity of Data Reviewed Labs:  Decision-making details documented in ED Course.  Risk Decision regarding hospitalization.   Nicole Graves is a 77 y.o. female with comorbidities that complicate the patient evaluation including SVT status post ablation, severe aortic stenosis, CKD, and hyperlipidemia who presents to the emergency department with shortness of breath.   Initial Ddx:  Pleural effusion, pneumonia, heart failure  MDM/Course:  Patient presents to the emergency department with mild worsening shortness of breath and outpatient x-ray showing worsening effusions.  Unclear the exact etiology.  It was suspected that due to the exudative nature that it could be due to trauma.  She is satting well on room air does have mildly increased work of breathing.  Was discussed with the on-call pulmonologist (Dr. Celine Mans) who feels that the patient would likely benefit from diuresis.  Did discuss outpatient management but they have had difficulty getting her in clinic within the next few days.  Did shared decision-making with the family about disposition since he is currently on room air but they reported that when she was on the Lasix she had frequent falls and was very dizzy.  Will admit her to hospitalist for IV diuresis.  Pulmonology will see the patient tomorrow to see if thoracentesis is  required.  This patient presents to the ED for concern of complaints listed in HPI, this involves an extensive number of treatment options, and is a complaint that carries with it a high risk of complications and morbidity. Disposition including potential need for admission considered.   Dispo: Admit to Floor  Additional history obtained from daughter Records reviewed DC Summary The following labs were independently interpreted: Chemistry and show no acute abnormality I independently reviewed the following imaging with scope of interpretation limited to determining acute life threatening conditions related to emergency care: Chest x-ray and agree with the radiologist interpretation with the following exceptions: none I personally reviewed and interpreted the pt's EKG: see above for interpretation  I have reviewed the patients home medications and made adjustments as needed Consults: Hospitalist and pulmonology Social Determinants of health:  Elderly         Final Clinical Impression(s) / ED Diagnoses Final diagnoses:  Dyspnea, unspecified type  Pleural effusion    Rx / DC Orders ED Discharge Orders     None         Rondel Baton, MD 08/29/22 2139

## 2022-08-29 NOTE — ED Triage Notes (Signed)
Patient had recent hospitalization for pulmonary edema and was discharged last Monday. Pt has continued to have sob. She had f/u appointment today and had CXR done and was told to come back to the ED since the fluid is re-accumulating.

## 2022-08-29 NOTE — Telephone Encounter (Signed)
Patient states that she was seen today at primary office and was told that her lungs are feeling with water and she is headed back to hospital. They were told to call cardiologist to inform.

## 2022-08-29 NOTE — H&P (Signed)
History and Physical  Nicole Graves:[id number] DOB: 1945-05-03 DOA: 08/29/2022  Referring physician: Dr. Eloise Harman, EDP  PCP: [name], MD  Outpatient Specialists: Cardiology Patient coming from: Home  Chief Complaint: Shortness of breath, wheezing  HPI: Nicole Graves is a 77 y.o. female with medical history significant for SVT status post ablation on 06/27/2022, severe AS status post TAVR in 2023, chronic HFpEF, CKD stage IIIa, hyperlipidemia, chronic anxiety/depression, recently admitted from 08/19/2022 through 08/22/2022 for bilateral pleural effusions, status post right thoracentesis, who presents to the ED sent from her primary care provider due to recurrent bilateral pleural effusions left greater than right seen on chest x-ray done at PCPs office.  The patient was advised to go to the ED by her primary care provider.  The patient endorses exertional dyspnea.  Associated with audible wheezing.  In the ED, vital signs are stable.  Exam notable for mild rales at bases and audible wheezing.  She received IV Lasix and DuoNebs with improvement of symptomatology.  TRH, hospitalist service, was asked to admit.  ED Course: Temperature 98.1.  BP 103/78, pulse 82, respiratory rate 24, O2 saturation 94% on room air.  Lab studies notable for creatinine 0.97, GFR 59.  BNP 163.  Hemoglobin 8.8.  Platelet count 668.  Review of Systems: Review of systems as noted in the HPI. All other systems reviewed and are negative.   Past Medical History:  Diagnosis Date   Anemia    Anxiety    Arthritis    Knees, neck, back   Chronic lower back pain    with siatica   Colon polyp 1999   Depression    GERD (gastroesophageal reflux disease)    HTN (hypertension)    Hyperlipidemia    Mild aortic insufficiency    Mild mitral regurgitation    Orthostatic hypotension    PAT (paroxysmal atrial tachycardia)    PONV (postoperative nausea and vomiting)    S/P TAVR (transcatheter aortic valve replacement)  03/16/2021   s/p TAVR with a 29 mm Medtronic Evolut FX via the TF approach by Dr. Lynnette Caffey and Dr. Laneta Simmers   Severe aortic stenosis    Stage 3b chronic kidney disease (CKD) (HCC)    SVT (supraventricular tachycardia)    TR (tricuspid regurgitation) 09/19/2016   mild by echo 09/2021   Past Surgical History:  Procedure Laterality Date   CESAREAN SECTION  1964   X2   CHOLECYSTECTOMY     COSMETIC SURGERY  1980   removal extra fat inner thighs   EYE SURGERY Bilateral     Cataract extraction with IOL   INTRAOPERATIVE TRANSTHORACIC ECHOCARDIOGRAM N/A 03/16/2021   Procedure: INTRAOPERATIVE TRANSTHORACIC ECHOCARDIOGRAM;  Surgeon: Orbie Pyo, MD;  Location: [facility] OR;  Service: Open Heart Surgery;  Laterality: N/A;   RIGHT HEART CATH AND CORONARY ANGIOGRAPHY N/A 02/17/2021   Procedure: RIGHT HEART CATH AND CORONARY ANGIOGRAPHY;  Surgeon: Orbie Pyo, MD;  Location: [facility] INVASIVE [facility];  Service: Cardiovascular;  Laterality: N/A;   SVT ABLATION N/A 06/27/2022   Procedure: SVT ABLATION;  Surgeon: Marinus Maw, MD;  Location: [facility];  Service: Cardiovascular;  Laterality: N/A;   TOTAL KNEE ARTHROPLASTY Right 11/30/2015   Procedure: RIGHT TOTAL KNEE ARTHROPLASTY;  Surgeon: Ollen Gross, MD;  Location: [facility] ORS;  Service: Orthopedics;  Laterality: Right;   TOTAL KNEE ARTHROPLASTY Left 11/14/2016   Procedure: LEFT TOTAL KNEE ARTHROPLASTY;  Surgeon: Ollen Gross, MD;  Location: [facility];  Service: Orthopedics;  Laterality: Left;  TRANSCATHETER AORTIC VALVE REPLACEMENT, TRANSFEMORAL Right 03/16/2021   Procedure: TRANSCATHETER AORTIC VALVE REPLACEMENT, TRANSFEMORAL;  Surgeon: Orbie Pyo, MD;  Location: [facility] OR;  Service: Open Heart Surgery;  Laterality: Right;    Social History:  reports that she has never smoked. She has never used smokeless tobacco. She reports that she does not drink alcohol and does not use drugs.   Allergies  Allergen Reactions   Sulfa Antibiotics Nausea And  Vomiting and Other (See Comments)   Ativan [Lorazepam] Other (See Comments)    Hyperactive    Tizanidine Hcl Other (See Comments)    Pt does not remember     Family History  Problem Relation Age of Onset   Colon cancer Mother    Heart attack Brother    Hypertension Brother    Stroke Neg Hx       Prior to Admission medications   Medication Sig Start Date End Date Taking? Authorizing Provider  acetaminophen (TYLENOL) 650 MG CR tablet Take 1,300 mg by mouth in the morning and at bedtime.   Yes [provider]  alendronate (FOSAMAX) 70 MG tablet Take 70 mg by mouth every Thursday.   Yes [provider]  aspirin EC 81 MG tablet Take 81 mg by mouth daily. Swallow whole.   Yes [provider]  atorvastatin (LIPITOR) 10 MG tablet Take 10 mg by mouth at bedtime.  02/11/15  Yes [provider]  buPROPion (WELLBUTRIN) 100 MG tablet Take 100 mg by mouth daily.   Yes [provider]  cyclobenzaprine (FLEXERIL) 10 MG tablet Take 1 tablet (10 mg total) by mouth at bedtime. 04/14/22  Yes Medina-Vargas, Monina C, NP  DULoxetine (CYMBALTA) 30 MG capsule Take 90 mg by mouth daily after breakfast.   Yes [provider]  famotidine (PEPCID) 20 MG tablet Take 20 mg by mouth daily as needed for heartburn.   Yes [provider]  ferrous sulfate 325 (65 FE) MG tablet Take 325 mg by mouth daily.   Yes [provider]  gabapentin (NEURONTIN) 300 MG capsule Take 300 mg by mouth 3 (three) times daily. 12/20/19  Yes [provider]  Melatonin 5 MG CAPS Take 1 capsule by mouth at bedtime.   Yes [provider]  metoprolol succinate (TOPROL-XL) 50 MG 24 hr tablet Take 1 tablet (50 mg total) by mouth daily. Take with or immediately following a meal. You may take an extra 50 mg dose if you experience palpitations. 04/20/22  Yes Meriam Sprague, MD  lidocaine (LIDODERM) 5 % Place 1 patch onto the skin daily. Remove & Discard patch  within 12 hours or as directed by MD. Apply to site of pain Patient not taking: Reported on 08/29/2022 09/27/21   Joycelyn Das, MD    Physical Exam: BP (!) 148/81   Pulse 87   Temp (!) 97.3 F (36.3 C)   Resp (!) 24   SpO2 99%   General: 77 y.o. year-old female well developed well nourished in no acute distress.  Alert and oriented x3. Cardiovascular: Regular rate and rhythm with no rubs or gallops.  No thyromegaly or JVD noted.  No lower extremity edema. 2/4 pulses in all 4 extremities. Respiratory: Mild rales at bases.  Audible wheezing.  Poor inspiratory effort. Abdomen: Soft nontender nondistended with normal bowel sounds x4 quadrants. Muskuloskeletal: No cyanosis, clubbing or edema noted bilaterally Neuro: CN II-XII intact, strength, sensation, reflexes Skin: No ulcerative lesions noted or rashes Psychiatry: Judgement and insight appear normal.  Mood is appropriate for condition and setting          Labs on Admission:  Basic Metabolic Panel: Recent Labs  Lab 08/29/22 1749  NA 139  K 3.9  CL 101  CO2 24  GLUCOSE 102*  BUN 18  CREATININE 0.97  CALCIUM 8.7*   Liver Function Tests: Recent Labs  Lab 08/29/22 1749  AST 14*  ALT 11  ALKPHOS 84  BILITOT 0.4  PROT 6.2*  ALBUMIN 2.4*   No results for input(s): "LIPASE", "AMYLASE" in the last 168 hours. No results for input(s): "AMMONIA" in the last 168 hours. CBC: Recent Labs  Lab 08/29/22 1749  WBC 9.6  NEUTROABS 6.5  HGB 8.8*  HCT 29.8*  MCV 95.2  PLT 668*   Cardiac Enzymes: No results for input(s): "CKTOTAL", "CKMB", "CKMBINDEX", "TROPONINI" in the last 168 hours.  BNP (last 3 results) Recent Labs    08/20/22 0020 08/29/22 1749  BNP 302.5* 163.5*    ProBNP (last 3 results) No results for input(s): "PROBNP" in the last 8760 hours.  CBG: No results for input(s): "GLUCAP" in the last 168 hours.  Radiological Exams on Admission: DG Chest 2 View  Result Date: 08/29/2022 CLINICAL DATA:   Shortness of breath. EXAM: CHEST - 2 VIEW COMPARISON:  Same day. FINDINGS: Stable cardiomegaly. Status post transcatheter aortic valve repair. Bilateral pleural effusions are noted with associated bibasilar atelectasis, left greater than right. Degenerative changes seen involving the left glenohumeral joint. IMPRESSION: Bilateral pleural effusions are noted with associated bibasilar atelectasis, left greater than right. Electronically Signed   By: Lupita Raider M.D.   On: 08/29/2022 18:16     Assessment/Plan Present on Admission:  Pleural effusion, bilateral  Principal Problem:   Pleural effusion, bilateral  Recurrent bilateral pleural effusions, left greater than right unclear etiology Suspect related to congestive heart failure Last admission, the patient had right thoracentesis Pulmonary consulted Continue IV diuresing as tolerated Incentive spirometer  Exertional dyspnea, likely secondary to the above DuoNebs for dyspnea and wheezing Continue to treat underlying conditions IV diuresing, possible left thoracentesis, defer to pulmonary. Mobilize as tolerated  Acute on chronic HFpEF Recent 2D echo on 08/21/2022 which showed LVEF 60 to 65% BNP mildly elevated 163 Trace lower extremity edema Continue IV diuresing, closely monitor blood pressure and electrolytes while on IV diuretics. Replace electrolytes as indicated. Start strict I's and O's and daily weight  Chronic anxiety/depression Resume home regimen.  Iron deficiency anemia Hemoglobin is stable, resume home iron supplement No reported overt bleeding  Peripheral neuropathy Resume home gabapentin  Physical debility PT OT assessment Fall precautions.   Time: 75 minutes.   DVT prophylaxis: Subcu Lovenox daily  Code Status: Full code  Family Communication: None at bedside.  Disposition Plan: Admitted to telemetry cardiac unit.  Consults called: Pulmonary.  Admission status: Observation status   Status  is: Observation    Darlin Drop MD [facility] Pager [phone]  If 7PM-7AM, please contact night-coverage [email or web address] Password [facility]  08/29/2022, 9:43 PM

## 2022-08-29 NOTE — ED Provider Triage Note (Signed)
Emergency Medicine Provider Triage Evaluation Note  Nicole Graves , a 77 y.o. female  was evaluated in triage.  Pt complains of shortness of breath persistent since her last hospital discharge.  Patient was seen by her PCP today with a chest x-ray that showed worsening bilateral pleural effusions and was encouraged to return to the emergency department.  Patient denies any fevers or chills.  Reports a consistent nonproductive cough.  Denies any chest pain.  Review of Systems  Positive:  Negative:   Physical Exam  BP 123/70 (BP Location: Right Arm)   Pulse 91   Temp (!) 97.3 F (36.3 C)   Resp 20   SpO2 92%  Gen:   Awake, no distress   Resp:  Normal effort  MSK:   Moves extremities without difficulty  Other:  Patient is speaking in full sentences.  I do not appreciate any rales or rhonchi auscultated.  Medical Decision Making  Medically screening exam initiated at 5:41 PM.  Appropriate orders placed.  Nicole Graves was informed that the remainder of the evaluation will be completed by another provider, this initial triage assessment does not replace that evaluation, and the importance of remaining in the ED until their evaluation is complete.  Chest x-ray and labs ordered.   Achille Rich, [place] 08/29/22 1742

## 2022-08-30 ENCOUNTER — Encounter (HOSPITAL_COMMUNITY): Payer: Self-pay | Admitting: Internal Medicine

## 2022-08-30 DIAGNOSIS — Z9049 Acquired absence of other specified parts of digestive tract: Secondary | ICD-10-CM | POA: Diagnosis not present

## 2022-08-30 DIAGNOSIS — D509 Iron deficiency anemia, unspecified: Secondary | ICD-10-CM | POA: Diagnosis present

## 2022-08-30 DIAGNOSIS — T501X5A Adverse effect of loop [high-ceiling] diuretics, initial encounter: Secondary | ICD-10-CM | POA: Diagnosis not present

## 2022-08-30 DIAGNOSIS — Z6830 Body mass index (BMI) 30.0-30.9, adult: Secondary | ICD-10-CM | POA: Diagnosis not present

## 2022-08-30 DIAGNOSIS — R0602 Shortness of breath: Secondary | ICD-10-CM | POA: Diagnosis present

## 2022-08-30 DIAGNOSIS — I471 Supraventricular tachycardia, unspecified: Secondary | ICD-10-CM | POA: Diagnosis not present

## 2022-08-30 DIAGNOSIS — I951 Orthostatic hypotension: Secondary | ICD-10-CM | POA: Diagnosis not present

## 2022-08-30 DIAGNOSIS — E1140 Type 2 diabetes mellitus with diabetic neuropathy, unspecified: Secondary | ICD-10-CM | POA: Diagnosis present

## 2022-08-30 DIAGNOSIS — R490 Dysphonia: Secondary | ICD-10-CM | POA: Diagnosis not present

## 2022-08-30 DIAGNOSIS — I4891 Unspecified atrial fibrillation: Secondary | ICD-10-CM | POA: Diagnosis present

## 2022-08-30 DIAGNOSIS — F419 Anxiety disorder, unspecified: Secondary | ICD-10-CM | POA: Diagnosis present

## 2022-08-30 DIAGNOSIS — F32A Depression, unspecified: Secondary | ICD-10-CM | POA: Diagnosis present

## 2022-08-30 DIAGNOSIS — E1122 Type 2 diabetes mellitus with diabetic chronic kidney disease: Secondary | ICD-10-CM | POA: Diagnosis present

## 2022-08-30 DIAGNOSIS — N1832 Chronic kidney disease, stage 3b: Secondary | ICD-10-CM | POA: Diagnosis present

## 2022-08-30 DIAGNOSIS — Z7983 Long term (current) use of bisphosphonates: Secondary | ICD-10-CM | POA: Diagnosis not present

## 2022-08-30 DIAGNOSIS — E785 Hyperlipidemia, unspecified: Secondary | ICD-10-CM | POA: Diagnosis present

## 2022-08-30 DIAGNOSIS — N179 Acute kidney failure, unspecified: Secondary | ICD-10-CM | POA: Diagnosis not present

## 2022-08-30 DIAGNOSIS — T501X6A Underdosing of loop [high-ceiling] diuretics, initial encounter: Secondary | ICD-10-CM | POA: Diagnosis present

## 2022-08-30 DIAGNOSIS — E876 Hypokalemia: Secondary | ICD-10-CM | POA: Diagnosis not present

## 2022-08-30 DIAGNOSIS — I130 Hypertensive heart and chronic kidney disease with heart failure and stage 1 through stage 4 chronic kidney disease, or unspecified chronic kidney disease: Secondary | ICD-10-CM | POA: Diagnosis present

## 2022-08-30 DIAGNOSIS — Z7982 Long term (current) use of aspirin: Secondary | ICD-10-CM | POA: Diagnosis not present

## 2022-08-30 DIAGNOSIS — Z8249 Family history of ischemic heart disease and other diseases of the circulatory system: Secondary | ICD-10-CM | POA: Diagnosis not present

## 2022-08-30 DIAGNOSIS — K219 Gastro-esophageal reflux disease without esophagitis: Secondary | ICD-10-CM | POA: Diagnosis present

## 2022-08-30 DIAGNOSIS — Z953 Presence of xenogenic heart valve: Secondary | ICD-10-CM | POA: Diagnosis not present

## 2022-08-30 DIAGNOSIS — J90 Pleural effusion, not elsewhere classified: Secondary | ICD-10-CM | POA: Diagnosis not present

## 2022-08-30 DIAGNOSIS — I5033 Acute on chronic diastolic (congestive) heart failure: Secondary | ICD-10-CM | POA: Diagnosis present

## 2022-08-30 DIAGNOSIS — I082 Rheumatic disorders of both aortic and tricuspid valves: Secondary | ICD-10-CM | POA: Diagnosis present

## 2022-08-30 DIAGNOSIS — E669 Obesity, unspecified: Secondary | ICD-10-CM | POA: Diagnosis present

## 2022-08-30 DIAGNOSIS — E8809 Other disorders of plasma-protein metabolism, not elsewhere classified: Secondary | ICD-10-CM | POA: Diagnosis present

## 2022-08-30 LAB — MAGNESIUM: Magnesium: 1.9 mg/dL (ref 1.7–2.4)

## 2022-08-30 LAB — BASIC METABOLIC PANEL
Anion gap: 10 (ref 5–15)
BUN: 16 mg/dL (ref 8–23)
CO2: 24 mmol/L (ref 22–32)
Calcium: 8.5 mg/dL — ABNORMAL LOW (ref 8.9–10.3)
Chloride: 101 mmol/L (ref 98–111)
Creatinine, Ser: 0.97 mg/dL (ref 0.44–1.00)
GFR, Estimated: 59 mL/min — ABNORMAL LOW (ref >60)
Glucose, Bld: 92 mg/dL (ref 70–99)
Potassium: 3.8 mmol/L (ref 3.5–5.1)
Sodium: 135 mmol/L (ref 135–145)

## 2022-08-30 LAB — CBC
HCT: 29.2 % — ABNORMAL LOW (ref 36.0–46.0)
Hemoglobin: 8.7 g/dL — ABNORMAL LOW (ref 12.0–15.0)
MCH: 28.2 pg (ref 26.0–34.0)
MCHC: 29.8 g/dL — ABNORMAL LOW (ref 30.0–36.0)
MCV: 94.5 fL (ref 80.0–100.0)
Platelets: 597 10*3/uL — ABNORMAL HIGH (ref 150–400)
RBC: 3.09 MIL/uL — ABNORMAL LOW (ref 3.87–5.11)
RDW: 14.6 % (ref 11.5–15.5)
WBC: 9 10*3/uL (ref 4.0–10.5)
nRBC: 0 % (ref 0.0–0.2)

## 2022-08-30 LAB — PHOSPHORUS: Phosphorus: 2.8 mg/dL (ref 2.5–4.6)

## 2022-08-30 MED ORDER — ORAL CARE MOUTH RINSE: 15.0000 mL | OROMUCOSAL | Status: DC | PRN

## 2022-08-30 MED ORDER — FUROSEMIDE 10 MG/ML IJ SOLN
60.0000 mg | Freq: Three times a day (TID) | INTRAMUSCULAR | Status: DC
  Administered 2022-08-30: 60 mg via INTRAVENOUS
  Filled 2022-08-30: qty 6

## 2022-08-30 MED ORDER — MIDODRINE HCL 5 MG PO TABS
5.0000 mg | ORAL_TABLET | Freq: Three times a day (TID) | ORAL | Status: DC
  Administered 2022-08-30 – 2022-09-09 (×29): 5 mg via ORAL
  Filled 2022-08-30 (×30): qty 1

## 2022-08-30 MED ORDER — CYCLOBENZAPRINE HCL 10 MG PO TABS
10.0000 mg | ORAL_TABLET | Freq: Every evening | ORAL | Status: DC | PRN
  Administered 2022-08-30 – 2022-09-08 (×9): 10 mg via ORAL
  Filled 2022-08-30 (×9): qty 1

## 2022-08-30 MED ORDER — IPRATROPIUM-ALBUTEROL 0.5-2.5 (3) MG/3ML IN SOLN
3.0000 mL | Freq: Four times a day (QID) | RESPIRATORY_TRACT | Status: DC
  Administered 2022-08-30 – 2022-08-31 (×3): 3 mL via RESPIRATORY_TRACT
  Filled 2022-08-30 (×3): qty 3

## 2022-08-30 MED ORDER — ACETAMINOPHEN ER 650 MG PO TBCR: 1300.0000 mg | EXTENDED_RELEASE_TABLET | Freq: Three times a day (TID) | ORAL | Status: DC

## 2022-08-30 MED ORDER — FUROSEMIDE 10 MG/ML IJ SOLN
60.0000 mg | Freq: Two times a day (BID) | INTRAMUSCULAR | Status: AC
  Administered 2022-08-30: 60 mg via INTRAVENOUS
  Filled 2022-08-30: qty 6

## 2022-08-30 MED ORDER — ACETAMINOPHEN 500 MG PO TABS
1000.0000 mg | ORAL_TABLET | Freq: Four times a day (QID) | ORAL | Status: DC
  Administered 2022-08-30 – 2022-09-09 (×32): 1000 mg via ORAL
  Filled 2022-08-30 (×35): qty 2

## 2022-08-30 NOTE — Evaluation (Signed)
Physical Therapy Evaluation Patient Details Name: Nicole Graves MRN: [id number] DOB: [birth year] Today's Date: 08/30/2022  History of Present Illness  Nicole Graves is a 77 y.o. female who presents to the ED sent from her primary care provider due to recurrent bilateral pleural effusions left greater than right seen on chest x-ray done at PCPs office. PMH significant for SVT status post ablation on 06/27/2022, severe AS status post TAVR in 2023, chronic HFpEF, CKD stage IIIa, hyperlipidemia, chronic anxiety/depression, recently admitted from 08/19/2022 through 08/22/2022 for bilateral pleural effusions, status post right thoracentesis.   Clinical Impression  Nicole Graves is 77 y.o. female admitted with above HPI and diagnosis. Patient is currently limited by functional impairments below (see PT problem list). Patient lives with her daughter and her family and is typically independent with no AD for mobility at baseline. Currently pt limited by dizziness, SOB, and poor activity tolerance with mobility. Pt required min guard/assist for transfers and gait this date. BP stable and improved with mobility. Patient will benefit from continued skilled PT interventions to address impairments and progress independence with mobility. Acute PT will follow and progress as able.     Orthostatic VS for the past 24 hrs:  BP- Lying Pulse- Lying BP- Sitting Pulse- Sitting BP- Standing at 0 minutes Pulse- Standing at 0 minutes BP- Standing at 3 minutes Pulse- Standing at 3 minutes  08/30/22 1440 98/57 103 107/65 108 94/63 113 101/64 119  08/30/22 1451 -- -- 120/71 113 -- -- -- --          Assistance Recommended at Discharge Frequent or constant Supervision/Assistance  If plan is discharge home, recommend the following:  Can travel by private vehicle  A little help with walking and/or transfers;A little help with bathing/dressing/bathroom;Assistance with cooking/housework;Assist for transportation;Help with stairs or  ramp for entrance        Equipment Recommendations None recommended by PT  Recommendations for Other Services       Functional Status Assessment Patient has had a recent decline in their functional status and demonstrates the ability to make significant improvements in function in a reasonable and predictable amount of time.     Precautions / Restrictions Precautions Precautions: Fall Precaution Comments: reports just 1 fall in last 6 months to this PT, reports at least 3-4 close calls (pt reported 3 falls to OT this AM) Restrictions Weight Bearing Restrictions: No      Mobility  Bed Mobility Overal bed mobility: Needs Assistance Bed Mobility: Supine to Sit     Supine to sit: Supervision, HOB elevated     General bed mobility comments: sup for safety, pt using bed features to move to EOB    Transfers Overall transfer level: Needs assistance Equipment used: None Transfers: Sit to/from Stand Sit to Stand: Min guard           General transfer comment: guarding for safety pt reports slight dizziness that improves wtih gait.    Ambulation/Gait Ambulation/Gait assistance: Min assist Gait Distance (Feet): 80 Feet Assistive device: 1 person hand held assist Gait Pattern/deviations: Step-through pattern, Decreased stride length, Shuffle Gait velocity: decreased     General Gait Details: HHA provided at start and pt progressed to ambulating with no UE support. min assist intermitttently to steady due to slight sway. pt wtih limited arm swing and cues provided to increase for improved balance.  Stairs            Wheelchair Mobility     Tilt Bed  Modified Rankin (Stroke Patients Only)       Balance Overall balance assessment: Needs assistance Sitting-balance support: Feet unsupported, No upper extremity supported Sitting balance-Leahy Scale: Good     Standing balance support: During functional activity Standing balance-Leahy Scale: Fair Standing  balance comment: pt ambulates with no AD, mildly unsteady                             Pertinent Vitals/Pain Pain Assessment Pain Assessment: Faces Faces Pain Scale: Hurts even more Pain Location: Rt arm, elbow and shoulder Pain Descriptors / Indicators: Aching, Discomfort Pain Intervention(s): Limited activity within patient's tolerance, Monitored during session, Repositioned    Home Living Family/patient expects to be discharged to:: Private residence Living Arrangements: Children;Other (Comment) Available Help at Discharge: Family;Available 24 hours/day Type of Home: House Home Access: Stairs to enter Entrance Stairs-Rails: Right (wall on the other side) Entrance Stairs-Number of Steps: 3-4 Alternate Level Stairs-Number of Steps: 15 Home Layout: Able to live on main level with bedroom/bathroom;Two level;Full bath on main level Home Equipment: Rolling Walker (2 wheels);Cane - single point;BSC/3in1      Prior Function Prior Level of Function : Independent/Modified Independent             Mobility Comments: pt reports no DME use, reports 3 falls in last 6 months. reports due to orhtostatics.  last fall three weeks ago.       Hand Dominance   Dominant Hand: Right    Extremity/Trunk Assessment   Upper Extremity Assessment Upper Extremity Assessment: Defer to OT evaluation RUE Deficits / Details: Right shoulder flexion 0-90 degrees approximately.  Pt with increased pain with movement.  AROM elbow flexion WFLs with strength 3+/5, grip also 3+/5. RUE Coordination: decreased gross motor LUE Deficits / Details: Shoulder flexion 0-70 degrees with history of shoulder impairment and recommendation from MD for surgery.  Limitations in reaching her head for brushing her hair.  Elbow flexion/extension and grip 4/5. LUE Coordination: decreased gross motor    Lower Extremity Assessment Lower Extremity Assessment: RLE deficits/detail;LLE deficits/detail RLE Deficits /  Details: slightly weaker than left, especially at hip, grossly 4-/5 and 3+ at hip flexion RLE Sensation: WNL RLE Coordination: WNL LLE Deficits / Details: grossly 4/5 or better LLE Sensation: WNL LLE Coordination: WNL    Cervical / Trunk Assessment Cervical / Trunk Assessment: Normal  Communication   Communication: No difficulties  Cognition Arousal/Alertness: Awake/alert Behavior During Therapy: WFL for tasks assessed/performed Overall Cognitive Status: Within Functional Limits for tasks assessed                                          General Comments      Exercises     Assessment/Plan    PT Assessment Patient needs continued PT services  PT Problem List Decreased activity tolerance;Decreased balance;Decreased mobility;Decreased safety awareness;Decreased knowledge of precautions       PT Treatment Interventions Gait training;Stair training;DME instruction;Functional mobility training;Therapeutic exercise;Therapeutic activities;Balance training;Patient/family education    PT Goals (Current goals can be found in the Care Plan section)  Acute Rehab PT Goals Patient Stated Goal: return to independence PT Goal Formulation: With patient Time For Goal Achievement: 09/13/22 Potential to Achieve Goals: Good    Frequency Min 1X/week     Co-evaluation  AM-PAC PT "6 Clicks" Mobility  Outcome Measure Help needed turning from your back to your side while in a flat bed without using bedrails?: A Little Help needed moving from lying on your back to sitting on the side of a flat bed without using bedrails?: A Little Help needed moving to and from a bed to a chair (including a wheelchair)?: A Little Help needed standing up from a chair using your arms (e.g., wheelchair or bedside chair)?: A Little Help needed to walk in hospital room?: A Little Help needed climbing 3-5 steps with a railing? : A Lot 6 Click Score: 17    End of Session  Equipment Utilized During Treatment: Gait belt Activity Tolerance: Patient tolerated treatment well;Patient limited by fatigue Patient left: in chair;with call bell/phone within reach;with chair alarm set;with nursing/sitter in room Nurse Communication: Mobility status PT Visit Diagnosis: Other abnormalities of gait and mobility (R26.89);Muscle weakness (generalized) (M62.81);Repeated falls (R29.6);Difficulty in walking, not elsewhere classified (R26.2)    Time: 8295-6213 PT Time Calculation (min) (ACUTE ONLY): 28 min   Charges:   PT Evaluation $PT Eval Low Complexity: 1 Low PT Treatments $Gait Training: 8-22 mins PT General Charges $$ ACUTE PT VISIT: 1 Visit         Wynn Maudlin, DPT Acute Rehabilitation Services Office [phone]  08/30/22 3:27 PM

## 2022-08-30 NOTE — ED Notes (Signed)
ED TO INPATIENT HANDOFF REPORT  ED Nurse Name and Phone #: Nicholos Johns [phone]   S Name/Age/Gender Nicole Graves 77 y.o. female Room/Bed: 044C/044C  Code Status   Code Status: Full Code  Home/SNF/Other Home Patient oriented to: self, place, time, and situation Is this baseline? Yes   Triage Complete: Triage complete  Chief Complaint Pleural effusion, bilateral [J90]  Triage Note Patient had recent hospitalization for pulmonary edema and was discharged last Monday. Pt has continued to have sob. She had f/u appointment today and had CXR done and was told to come back to the ED since the fluid is re-accumulating.    Allergies Allergies  Allergen Reactions   Sulfa Antibiotics Nausea And Vomiting and Other (See Comments)   Ativan [Lorazepam] Other (See Comments)    Hyperactive    Tizanidine Hcl Other (See Comments)    Pt does not remember     Level of Care/Admitting Diagnosis ED Disposition     ED Disposition  Admit   Condition  --   Comment  Hospital Area: [facility] [[id number]]  Level of Care: Telemetry Cardiac [[id number]]  May place patient in observation at [facility] or [facility] if equivalent level of care is available:: Yes  Covid Evaluation: Asymptomatic - no recent exposure (last 10 days) testing not required  Diagnosis: Pleural effusion, bilateral [454098]  Admitting Physician: Darlin Drop [[id number]]  Attending Physician: Darlin Drop [[id number]]          B Medical/Surgery History Past Medical History:  Diagnosis Date   Anemia    Anxiety    Arthritis    Knees, neck, back   Chronic lower back pain    with siatica   Colon polyp 1999   Depression    GERD (gastroesophageal reflux disease)    HTN (hypertension)    Hyperlipidemia    Mild aortic insufficiency    Mild mitral regurgitation    Orthostatic hypotension    PAT (paroxysmal atrial tachycardia)    PONV (postoperative nausea and vomiting)    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 Lynnette Caffey and Dr. Laneta Simmers   Severe aortic stenosis    Stage 3b chronic kidney disease (CKD) (HCC)    SVT (supraventricular tachycardia)    TR (tricuspid regurgitation) 09/19/2016   mild by echo 09/2021   Past Surgical History:  Procedure Laterality Date   CESAREAN SECTION  1964   X2   CHOLECYSTECTOMY     COSMETIC SURGERY  1980   removal extra fat inner thighs   EYE SURGERY Bilateral     Cataract extraction with IOL   INTRAOPERATIVE TRANSTHORACIC ECHOCARDIOGRAM N/A 03/16/2021   Procedure: INTRAOPERATIVE TRANSTHORACIC ECHOCARDIOGRAM;  Surgeon: Orbie Pyo, MD;  Location: [facility] OR;  Service: Open Heart Surgery;  Laterality: N/A;   RIGHT HEART CATH AND CORONARY ANGIOGRAPHY N/A 02/17/2021   Procedure: RIGHT HEART CATH AND CORONARY ANGIOGRAPHY;  Surgeon: Orbie Pyo, MD;  Location: [facility] INVASIVE CV LAB;  Service: Cardiovascular;  Laterality: N/A;   SVT ABLATION N/A 06/27/2022   Procedure: SVT ABLATION;  Surgeon: Marinus Maw, MD;  Location: [facility] INVASIVE CV LAB;  Service: Cardiovascular;  Laterality: N/A;   TOTAL KNEE ARTHROPLASTY Right 11/30/2015   Procedure: RIGHT TOTAL KNEE ARTHROPLASTY;  Surgeon: Ollen Gross, MD;  Location: [facility] ORS;  Service: Orthopedics;  Laterality: Right;   TOTAL KNEE ARTHROPLASTY Left 11/14/2016   Procedure:  LEFT TOTAL KNEE ARTHROPLASTY;  Surgeon: Ollen Gross, MD;  Location: [facility] ORS;  Service: Orthopedics;  Laterality: Left;   TRANSCATHETER AORTIC VALVE REPLACEMENT, TRANSFEMORAL Right 03/16/2021   Procedure: TRANSCATHETER AORTIC VALVE REPLACEMENT, TRANSFEMORAL;  Surgeon: Orbie Pyo, MD;  Location: [facility] OR;  Service: Open Heart Surgery;  Laterality: Right;     A IV Location/Drains/Wounds Patient Lines/Drains/Airways Status     Active Line/Drains/Airways     Name Placement date Placement time Site Days   Peripheral IV 08/29/22 20 G 1" Anterior;Right Forearm  08/29/22  2148  Forearm  1            Intake/Output Last 24 hours  Intake/Output Summary (Last 24 hours) at 08/30/2022 1201 Last data filed at 08/30/2022 1610 Gross per 24 hour  Intake --  Output 1000 ml  Net -1000 ml    Labs/Imaging Results for orders placed or performed during the hospital encounter of 08/29/22 (from the past 48 hour(s))  Brain natriuretic peptide     Status: Abnormal   Collection Time: 08/29/22  5:49 PM  Result Value Ref Range   B Natriuretic Peptide 163.5 (H) 0.0 - 100.0 pg/mL    Comment: Performed at [facility], [address], [place], [place] [address]  Troponin I (High Sensitivity)     Status: Abnormal   Collection Time: 08/29/22  5:49 PM  Result Value Ref Range   Troponin I (High Sensitivity) 19 (H) <18 ng/L    Comment: (NOTE) Elevated high sensitivity troponin I (hsTnI) values and significant  changes across serial measurements may suggest ACS but many other  chronic and acute conditions are known to elevate hsTnI results.  Refer to the "Links" section for chest pain algorithms and additional  guidance. Performed at [facility], [address] [place], [place] [address]   CBC with Differential     Status: Abnormal   Collection Time: 08/29/22  5:49 PM  Result Value Ref Range   WBC 9.6 4.0 - 10.5 K/uL   RBC 3.13 (L) 3.87 - 5.11 MIL/uL   Hemoglobin 8.8 (L) 12.0 - 15.0 g/dL   HCT 19.1 (L) 47.8 - 29.5 %   MCV 95.2 80.0 - 100.0 fL   MCH 28.1 26.0 - 34.0 pg   MCHC 29.5 (L) 30.0 - 36.0 g/dL   RDW 62.1 30.8 - 65.7 %   Platelets 668 (H) 150 - 400 K/uL   nRBC 0.0 0.0 - 0.2 %   Neutrophils Relative % 67 %   Neutro Abs 6.5 1.7 - 7.7 K/uL   Lymphocytes Relative 19 %   Lymphs Abs 1.8 0.7 - 4.0 K/uL   Monocytes Relative 8 %   Monocytes Absolute 0.7 0.1 - 1.0 K/uL   Eosinophils Relative 4 %   Eosinophils Absolute 0.4 0.0 - 0.5 K/uL   Basophils Relative 1 %   Basophils Absolute 0.1 0.0 - 0.1 K/uL   Immature Granulocytes 1 %   Abs  Immature Granulocytes 0.07 0.00 - 0.07 K/uL    Comment: Performed at [facility], [address] [place], [place] [address]  Comprehensive metabolic panel     Status: Abnormal   Collection Time: 08/29/22  5:49 PM  Result Value Ref Range   Sodium 139 135 - 145 mmol/L   Potassium 3.9 3.5 - 5.1 mmol/L   Chloride 101 98 - 111 mmol/L   CO2 24 22 - 32 mmol/L   Glucose, Bld 102 (H) 70 - 99 mg/dL  Comment: Glucose reference range applies only to samples taken after fasting for at least 8 hours.   BUN 18 8 - 23 mg/dL   Creatinine, Ser 5.78 0.44 - 1.00 mg/dL   Calcium 8.7 (L) 8.9 - 10.3 mg/dL   Total Protein 6.2 (L) 6.5 - 8.1 g/dL   Albumin 2.4 (L) 3.5 - 5.0 g/dL   AST 14 (L) 15 - 41 U/L   ALT 11 0 - 44 U/L   Alkaline Phosphatase 84 38 - 126 U/L   Total Bilirubin 0.4 0.3 - 1.2 mg/dL   GFR, Estimated 59 (L) >60 mL/min    Comment: (NOTE) Calculated using the CKD-EPI Creatinine Equation (2021)    Anion gap 14 5 - 15    Comment: Performed at [facility], [address], [place], [place] [address]  Troponin I (High Sensitivity)     Status: Abnormal   Collection Time: 08/29/22  8:48 PM  Result Value Ref Range   Troponin I (High Sensitivity) 20 (H) <18 ng/L    Comment: (NOTE) Elevated high sensitivity troponin I (hsTnI) values and significant  changes across serial measurements may suggest ACS but many other  chronic and acute conditions are known to elevate hsTnI results.  Refer to the "Links" section for chest pain algorithms and additional  guidance. Performed at [facility], [address], [place], [place] [address]   CBC     Status: Abnormal   Collection Time: 08/30/22  6:07 AM  Result Value Ref Range   WBC 9.0 4.0 - 10.5 K/uL   RBC 3.09 (L) 3.87 - 5.11 MIL/uL   Hemoglobin 8.7 (L) 12.0 - 15.0 g/dL   HCT 13.2 (L) 44.0 - 10.2 %   MCV 94.5 80.0 - 100.0 fL   MCH 28.2 26.0 - 34.0 pg   MCHC 29.8 (L) 30.0 - 36.0 g/dL   RDW 72.5 36.6 - 44.0 %   Platelets 597 (H)  150 - 400 K/uL   nRBC 0.0 0.0 - 0.2 %    Comment: Performed at [facility], [address] [place], [place] [address]  Basic metabolic panel     Status: Abnormal   Collection Time: 08/30/22  6:07 AM  Result Value Ref Range   Sodium 135 135 - 145 mmol/L   Potassium 3.8 3.5 - 5.1 mmol/L   Chloride 101 98 - 111 mmol/L   CO2 24 22 - 32 mmol/L   Glucose, Bld 92 70 - 99 mg/dL    Comment: Glucose reference range applies only to samples taken after fasting for at least 8 hours.   BUN 16 8 - 23 mg/dL   Creatinine, Ser 5.95 0.44 - 1.00 mg/dL   Calcium 8.5 (L) 8.9 - 10.3 mg/dL   GFR, Estimated 59 (L) >60 mL/min    Comment: (NOTE) Calculated using the CKD-EPI Creatinine Equation (2021)    Anion gap 10 5 - 15    Comment: Performed at [facility], [address] [place], [place] [address]  Magnesium     Status: None   Collection Time: 08/30/22  6:07 AM  Result Value Ref Range   Magnesium 1.9 1.7 - 2.4 mg/dL    Comment: Performed at [facility], [address] [place], [place] [address]  Phosphorus     Status: None   Collection Time: 08/30/22  6:07 AM  Result Value Ref Range   Phosphorus 2.8 2.5 - 4.6 mg/dL    Comment: Performed at  [facility], [address], [place], [place] [address]   DG Chest 2 View  Result Date: 08/29/2022 CLINICAL DATA:  Shortness of breath. EXAM: CHEST - 2 VIEW COMPARISON:  Same day. FINDINGS: Stable cardiomegaly. Status post transcatheter aortic valve repair. Bilateral pleural effusions are noted with associated bibasilar atelectasis, left greater than right. Degenerative changes seen involving the left glenohumeral joint. IMPRESSION: Bilateral pleural effusions are noted with associated bibasilar atelectasis, left greater than right. Electronically Signed   By: Lupita Raider M.D.   On: 08/29/2022 18:16    Pending Labs Unresulted Labs (From admission, onward)     Start     Ordered   09/05/22 0500  Creatinine, serum  (enoxaparin  (LOVENOX)    CrCl >/= 30 ml/min)  Weekly,   R     Comments: while on enoxaparin therapy    08/29/22 2140            Vitals/Pain Today's Vitals   08/30/22 1000 08/30/22 1015 08/30/22 1030 08/30/22 1045  BP: 101/78  94/72 114/78  Pulse: 92 (!) 104 (!) 109 91  Resp: 17  (!) 24 18  Temp:      TempSrc:      SpO2: 95% 91% 94% 93%  Weight:      Height:      PainSc:        Isolation Precautions No active isolations  Medications Medications  enoxaparin (LOVENOX) injection 40 mg (40 mg Subcutaneous Given 08/29/22 2218)  atorvastatin (LIPITOR) tablet 10 mg (10 mg Oral Given 08/29/22 2356)  aspirin EC tablet 81 mg (81 mg Oral Given 08/30/22 1000)  buPROPion (WELLBUTRIN) tablet 100 mg (has no administration in time range)  DULoxetine (CYMBALTA) DR capsule 90 mg (has no administration in time range)  famotidine (PEPCID) tablet 20 mg (has no administration in time range)  ferrous sulfate tablet 325 mg (325 mg Oral Given 08/30/22 0822)  gabapentin (NEURONTIN) capsule 300 mg (300 mg Oral Given 08/30/22 1000)  ipratropium-albuterol (DUONEB) 0.5-2.5 (3) MG/3ML nebulizer solution 3 mL (3 mLs Nebulization Given 08/30/22 0615)  midodrine (PROAMATINE) tablet 5 mg (has no administration in time range)  furosemide (LASIX) injection 60 mg (has no administration in time range)  furosemide (LASIX) injection 20 mg (20 mg Intravenous Given 08/30/22 0822)  midodrine (PROAMATINE) tablet 5 mg (5 mg Oral Given 08/29/22 2311)  ipratropium-albuterol (DUONEB) 0.5-2.5 (3) MG/3ML nebulizer solution 3 mL (3 mLs Nebulization Given 08/29/22 2218)    Mobility walks     Focused Assessments Cardiac Assessment Handoff:    Lab Results  Component Value Date   CKTOTAL 50 10/09/2008   CKMB 1.4 10/09/2008   TROPONINI 0.13 (H) 02/15/2015   No results found for: "DDIMER" Does the Patient currently have chest pain? No    R Recommendations: See Admitting Provider Note  Report given to:   Additional Notes:  ,.

## 2022-08-30 NOTE — Evaluation (Signed)
Occupational Therapy Evaluation Patient Details Name: Nicole Graves MRN: [id number] DOB: May 10, 1945 Today's Date: 08/30/2022   History of Present Illness Nicole Graves is a 77 y.o. female who presents to the ED sent from her primary care provider due to recurrent bilateral pleural effusions left greater than right seen on chest x-ray done at PCPs office. PMH significant for SVT status post ablation on 06/27/2022, severe AS status post TAVR in 2023, chronic HFpEF, CKD stage IIIa, hyperlipidemia, chronic anxiety/depression, recently admitted from 08/19/2022 through 08/22/2022 for bilateral pleural effusions, status post right thoracentesis.   Clinical Impression   Pt currently at min guard to min assist for functional transfers and LB selfcare sit to stand during session.  Slight lightheadedness with mobility.  BP in standing at 94/72.  Dyspnea 3/4 with ambulation but oxygen sats maintained above 92% on room air.  She reports living with her daughter and having 24 hour supervision.  Feel she will benefit from acute care OT at this time with HHOT recommended to continue progression.        Recommendations for follow up therapy are one component of a multi-disciplinary discharge planning process, led by the attending physician.  Recommendations may be updated based on patient status, additional functional criteria and insurance authorization.   Assistance Recommended at Discharge Intermittent Supervision/Assistance  Patient can return home with the following Assistance with cooking/housework;Assist for transportation;Help with stairs or ramp for entrance;A little help with bathing/dressing/bathroom    Functional Status Assessment  Patient has had a recent decline in their functional status and demonstrates the ability to make significant improvements in function in a reasonable and predictable amount of time.  Equipment Recommendations  Tub/shower seat;Other (comment) (will check with daughter to see if  they want to purchase from here or get outside of the hospital)       Precautions / Restrictions Precautions Precautions: Fall Precaution Comments: monitor BP Restrictions Weight Bearing Restrictions: No      Mobility Bed Mobility Overal bed mobility: Needs Assistance Bed Mobility: Supine to Sit, Sit to Supine     Supine to sit: Supervision     General bed mobility comments: Pt already on the [facility] to start session, only assisted with transition to supine.    Transfers Overall transfer level: Needs assistance Equipment used: None Transfers: Sit to/from Stand Sit to Stand: Supervision           General transfer comment: Min guard for mobility out in the hallway without an assistive device. Min assist needed as she became fatigued and lightheaded.  HR stable in the low 100s with O2 sats at 92% or better on room air.      Balance Overall balance assessment: Needs assistance Sitting-balance support: Feet unsupported, No upper extremity supported Sitting balance-Leahy Scale: Good     Standing balance support: During functional activity Standing balance-Leahy Scale: Poor Standing balance comment: Pt needs occasional UE support for balance secondary to light headedness and limited endurance.                           ADL either performed or assessed with clinical judgement   ADL Overall ADL's : Needs assistance/impaired Eating/Feeding: Independent;Sitting Eating/Feeding Details (indicate cue type and reason): simulated Grooming: Wash/dry hands;Wash/dry face;Supervision/safety;Standing Grooming Details (indicate cue type and reason): simulated Upper Body Bathing: Set up;Sitting Upper Body Bathing Details (indicate cue type and reason): simulated Lower Body Bathing: Min guard;Sit to/from stand Lower Body Bathing Details (indicate cue  type and reason): simulated Upper Body Dressing : Minimal assistance;Sitting Upper Body Dressing Details (indicate cue type and  reason): simulated fastening bra pulling garments over head Lower Body Dressing: Min guard;Sit to/from stand   Toilet Transfer: Min guard;Ambulation Toilet Transfer Details (indicate cue type and reason): hand held assist without assistive device Toileting- Clothing Manipulation and Hygiene: Minimal assistance;Sit to/from stand       Functional mobility during ADLs: Minimal assistance (ambulation without assistive device) General ADL Comments: Pt's BP in supine 121/77, sitting 114/82, and in standing 94/72.  Increased lightheadedness noted requiring min assist for balance after ambulating without an assistive device in the hall approximately 40' and attempting to ambulate back to the room.  Recommend shower tub seat for safety at home secondary to pt having recent fall.  She will discuss with her daughter to determine purchasing from hospital or outside source.     Vision Baseline Vision/History: 0 No visual deficits Ability to See in Adequate Light: 0 Adequate Patient Visual Report: No change from baseline Vision Assessment?: No apparent visual deficits     Perception  Not tested   Praxis  Not tested    Pertinent Vitals/Pain Pain Assessment Pain Assessment: 0-10 Pain Score: 5  Pain Location: R side from fall Pain Descriptors / Indicators: Aching, Discomfort Pain Intervention(s): Limited activity within patient's tolerance     Hand Dominance Right   Extremity/Trunk Assessment Upper Extremity Assessment Upper Extremity Assessment: RUE deficits/detail;LUE deficits/detail RUE Deficits / Details: Right shoulder flexion 0-90 degrees approximately.  Pt with increased pain with movement.  AROM elbow flexion WFLs with strength 3+/5, grip also 3+/5. RUE Coordination: decreased gross motor LUE Deficits / Details: Shoulder flexion 0-70 degrees with history of shoulder impairment and recommendation from MD for surgery.  Limitations in reaching her head for brushing her hair.  Elbow  flexion/extension and grip 4/5. LUE Coordination: decreased gross motor   Lower Extremity Assessment Lower Extremity Assessment: Defer to PT evaluation   Cervical / Trunk Assessment Cervical / Trunk Assessment: Normal   Communication Communication Communication: No difficulties   Cognition Arousal/Alertness: Awake/alert Behavior During Therapy: WFL for tasks assessed/performed Overall Cognitive Status: Within Functional Limits for tasks assessed                                                  Home Living Family/patient expects to be discharged to:: Private residence Living Arrangements: Children;Other (Comment) (daughter and her family) Available Help at Discharge: Family;Available 24 hours/day Type of Home: House Home Access: Stairs to enter [employer] of Steps: 3-4 (from garage) Entrance Stairs-Rails: Right Home Layout: Able to live on main level with bedroom/bathroom;Two level Alternate Level Stairs-Number of Steps: 15 (to laundry) Alternate Level Stairs-Rails: Left Bathroom Shower/Tub: Chief Strategy Officer: Standard     Home Equipment: Agricultural consultant (2 wheels);Cane - single point;BSC/3in1;Rollator (4 wheels)          Prior Functioning/Environment Prior Level of Function : Independent/Modified Independent             Mobility Comments: pt reports no DME use, reports 3 falls in last 6 months. reports due to orhtostatics.  :ast fall three weeks ago ADLs Comments: pt no longer driving,Has needed some assist with ADLs since last fall.  Helps her daughter's family with laundry and meal prep.  OT Problem List: Decreased strength;Decreased range of motion;Decreased activity tolerance;Impaired balance (sitting and/or standing);Pain;Cardiopulmonary status limiting activity;Decreased knowledge of use of DME or AE;Impaired UE functional use      OT Treatment/Interventions: Self-care/ADL training;Patient/family  education;Balance training;Therapeutic activities;DME and/or AE instruction;Energy conservation;Therapeutic exercise    OT Goals(Current goals can be found in the care plan section) Acute Rehab OT Goals Patient Stated Goal: To get her breathing better. OT Goal Formulation: With patient Time For Goal Achievement: 09/05/22 Potential to Achieve Goals: Good  OT Frequency: Min 1X/week       AM-PAC OT "6 Clicks" Daily Activity     Outcome Measure Help from another person eating meals?: None Help from another person taking care of personal grooming?: A Little Help from another person toileting, which includes using toliet, bedpan, or urinal?: A Little Help from another person bathing (including washing, rinsing, drying)?: A Little Help from another person to put on and taking off regular upper body clothing?: A Little Help from another person to put on and taking off regular lower body clothing?: A Little 6 Click Score: 19   End of Session Equipment Utilized During Treatment: Gait belt Nurse Communication: Mobility status (orthostatics)  Activity Tolerance: Patient limited by fatigue Patient left: in bed;with call bell/phone within reach  OT Visit Diagnosis: Unsteadiness on feet (R26.81);Repeated falls (R29.6);Muscle weakness (generalized) (M62.81);Pain Pain - Right/Left: Right Pain - part of body: Shoulder;Arm                Time: 9629-5284 OT Time Calculation (min): 35 min Charges:  OT General Charges $OT Visit: 1 Visit OT Evaluation $OT Eval Low Complexity: 1 Low OT Treatments $Self Care/Home Management : 8-22 mins  Perrin Maltese, OTR/L Acute Rehabilitation Services  Office [phone] 08/30/2022

## 2022-08-30 NOTE — ED Notes (Signed)
ED TO INPATIENT HANDOFF REPORT  ED Nurse Name and Phone #: Nicholos Johns [phone]  S Name/Age/Gender Nicole Graves 77 y.o. female Room/Bed: 044C/044C  Code Status   Code Status: Full Code  Home/SNF/Other Home Patient oriented to: self, place, time, and situation Is this baseline? Yes   Triage Complete: Triage complete  Chief Complaint Pleural effusion, bilateral [J90]  Triage Note Patient had recent hospitalization for pulmonary edema and was discharged last Monday. Pt has continued to have sob. She had f/u appointment today and had CXR done and was told to come back to the ED since the fluid is re-accumulating.    Allergies Allergies  Allergen Reactions   Sulfa Antibiotics Nausea And Vomiting and Other (See Comments)   Ativan [Lorazepam] Other (See Comments)    Hyperactive    Tizanidine Hcl Other (See Comments)    Pt does not remember     Level of Care/Admitting Diagnosis ED Disposition     ED Disposition  Admit   Condition  --   Comment  Hospital Area: [facility] [[id number]]  Level of Care: Telemetry Cardiac [103]  [facility] place patient in [facility] if equivalent level of care is available:: Yes  Covid Evaluation: Asymptomatic - no recent exposure (last 10 days) testing not required  Diagnosis: Pleural effusion, bilateral [130865]  Admitting Physician: Darlin Drop [[id number]]  Attending Physician: Darlin Drop [[id number]]          B Medical/Surgery History Past Medical History:  Diagnosis Date   Anemia    Anxiety    Arthritis    Knees, neck, back   Chronic lower back pain    with siatica   Colon polyp 1999   Depression    GERD (gastroesophageal reflux disease)    HTN (hypertension)    Hyperlipidemia    Mild aortic insufficiency    Mild mitral regurgitation    Orthostatic hypotension    PAT (paroxysmal atrial tachycardia)    PONV (postoperative nausea and vomiting)    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 Lynnette Caffey and Dr. Laneta Simmers   Severe aortic stenosis    Stage 3b chronic kidney disease (CKD) (HCC)    SVT (supraventricular tachycardia)    TR (tricuspid regurgitation) 09/19/2016   mild by echo 09/2021   Past Surgical History:  Procedure Laterality Date   CESAREAN SECTION  1964   X2   CHOLECYSTECTOMY     COSMETIC SURGERY  1980   removal extra fat inner thighs   EYE SURGERY Bilateral     Cataract extraction with IOL   INTRAOPERATIVE TRANSTHORACIC ECHOCARDIOGRAM N/A 03/16/2021   Procedure: INTRAOPERATIVE TRANSTHORACIC ECHOCARDIOGRAM;  Surgeon: Orbie Pyo, MD;  Location: [facility] OR;  Service: Open Heart Surgery;  Laterality: N/A;   RIGHT HEART CATH AND CORONARY ANGIOGRAPHY N/A 02/17/2021   Procedure: RIGHT HEART CATH AND CORONARY ANGIOGRAPHY;  Surgeon: Orbie Pyo, MD;  [facility];  Service: Cardiovascular;  Laterality: N/A;   SVT ABLATION N/A 06/27/2022   Procedure: SVT ABLATION;  Surgeon: Marinus Maw, MD;  Location: [facility];  Service: Cardiovascular;  Laterality: N/A;   TOTAL KNEE ARTHROPLASTY Right 11/30/2015   Procedure: RIGHT TOTAL KNEE ARTHROPLASTY;  Surgeon: Ollen Gross, MD;  Location: [facility] ORS;  Service: Orthopedics;  Laterality: Right;   TOTAL KNEE ARTHROPLASTY Left 11/14/2016   Procedure: LEFT  TOTAL KNEE ARTHROPLASTY;  Surgeon: Ollen Gross, MD;  Location: [facility];  Service: Orthopedics;  Laterality: Left;   TRANSCATHETER AORTIC VALVE REPLACEMENT, TRANSFEMORAL Right 03/16/2021   Procedure: TRANSCATHETER AORTIC VALVE REPLACEMENT, TRANSFEMORAL;  Surgeon: Orbie Pyo, MD;  Location: [facility] OR;  Service: Open Heart Surgery;  Laterality: Right;     A IV Location/Drains/Wounds Patient Lines/Drains/Airways Status     Active Line/Drains/Airways     Name Placement date Placement time Site Days   Peripheral IV 08/29/22 20 G 1" Anterior;Right Forearm  08/29/22  2148  Forearm  1            Intake/Output Last 24 hours No intake or output data in the 24 hours ending 08/30/22 0744  Labs/Imaging Results for orders placed or performed during the hospital encounter of 08/29/22 (from the past 48 hour(s))  Brain natriuretic peptide     Status: Abnormal   Collection Time: 08/29/22  5:49 PM  Result Value Ref Range   B Natriuretic Peptide 163.5 (H) 0.0 - 100.0 pg/mL    Comment: Performed at [facility], [address] [place], [place] [address]  Troponin I (High Sensitivity)     Status: Abnormal   Collection Time: 08/29/22  5:49 PM  Result Value Ref Range   Troponin I (High Sensitivity) 19 (H) <18 ng/L    Comment: (NOTE) Elevated high sensitivity troponin I (hsTnI) values and significant  changes across serial measurements may suggest ACS but many other  chronic and acute conditions are known to elevate hsTnI results.  Refer to the "Links" section for chest pain algorithms and additional  guidance. Performed at [facility], [address] [place], [place] [address]   CBC with Differential     Status: Abnormal   Collection Time: 08/29/22  5:49 PM  Result Value Ref Range   WBC 9.6 4.0 - 10.5 K/uL   RBC 3.13 (L) 3.87 - 5.11 MIL/uL   Hemoglobin 8.8 (L) 12.0 - 15.0 g/dL   HCT 57.8 (L) 46.9 - 62.9 %   MCV 95.2 80.0 - 100.0 fL   MCH 28.1 26.0 - 34.0 pg   MCHC 29.5 (L) 30.0 - 36.0 g/dL   RDW 52.8 41.3 - 24.4 %   Platelets 668 (H) 150 - 400 K/uL   nRBC 0.0 0.0 - 0.2 %   Neutrophils Relative % 67 %   Neutro Abs 6.5 1.7 - 7.7 K/uL   Lymphocytes Relative 19 %   Lymphs Abs 1.8 0.7 - 4.0 K/uL   Monocytes Relative 8 %   Monocytes Absolute 0.7 0.1 - 1.0 K/uL   Eosinophils Relative 4 %   Eosinophils Absolute 0.4 0.0 - 0.5 K/uL   Basophils Relative 1 %   Basophils Absolute 0.1 0.0 - 0.1 K/uL   Immature Granulocytes 1 %   Abs Immature Granulocytes 0.07 0.00 - 0.07 K/uL    Comment: Performed at [facility], [address] [place], [place] [address]  Comprehensive metabolic panel     Status: Abnormal   Collection Time: 08/29/22  5:49 PM  Result Value Ref Range   Sodium 139 135 - 145 mmol/L   Potassium 3.9 3.5 - 5.1 mmol/L   Chloride 101 98 - 111 mmol/L   CO2 24 22 - 32 mmol/L   Glucose, Bld 102 (H) 70 - 99 mg/dL    Comment: Glucose reference range applies only to samples taken after fasting for at least 8 hours.   BUN 18 8 -  23 mg/dL   Creatinine, Ser 4.09 0.44 - 1.00 mg/dL   Calcium 8.7 (L) 8.9 - 10.3 mg/dL   Total Protein 6.2 (L) 6.5 - 8.1 g/dL   Albumin 2.4 (L) 3.5 - 5.0 g/dL   AST 14 (L) 15 - 41 U/L   ALT 11 0 - 44 U/L   Alkaline Phosphatase 84 38 - 126 U/L   Total Bilirubin 0.4 0.3 - 1.2 mg/dL   GFR, Estimated 59 (L) >60 mL/min    Comment: (NOTE) Calculated using the CKD-EPI Creatinine Equation (2021)    Anion gap 14 5 - 15    Comment: Performed at [facility], [address] [place], [place] [address]  Troponin I (High Sensitivity)     Status: Abnormal   Collection Time: 08/29/22  8:48 PM  Result Value Ref Range   Troponin I (High Sensitivity) 20 (H) <18 ng/L    Comment: (NOTE) Elevated high sensitivity troponin I (hsTnI) values and significant  changes across serial measurements may suggest ACS but many other  chronic and acute conditions are known to elevate hsTnI results.  Refer to the "Links" section for chest pain algorithms and additional  guidance. Performed at [facility], [address], [place], [place] [address]   CBC     Status: Abnormal   Collection Time: 08/30/22  6:07 AM  Result Value Ref Range   WBC 9.0 4.0 - 10.5 K/uL   RBC 3.09 (L) 3.87 - 5.11 MIL/uL   Hemoglobin 8.7 (L) 12.0 - 15.0 g/dL   HCT 56.2 (L) 13.0 - 86.5 %   MCV 94.5 80.0 - 100.0 fL   MCH 28.2 26.0 - 34.0 pg   MCHC 29.8 (L) 30.0 - 36.0 g/dL   RDW 78.4 69.6 - 29.5 %   Platelets 597 (H) 150 - 400 K/uL   nRBC 0.0 0.0 - 0.2 %    Comment: Performed at [facility], [address] [place], [place] [address]  Basic metabolic panel     Status: Abnormal   Collection Time: 08/30/22  6:07 AM  Result Value Ref Range   Sodium 135 135 - 145 mmol/L   Potassium 3.8 3.5 - 5.1 mmol/L   Chloride 101 98 - 111 mmol/L   CO2 24 22 - 32 mmol/L   Glucose, Bld 92 70 - 99 mg/dL    Comment: Glucose reference range applies only to samples taken after fasting for at least 8 hours.   BUN 16 8 - 23 mg/dL   Creatinine, Ser 2.44 0.44 - 1.00 mg/dL   Calcium 8.5 (L) 8.9 - 10.3 mg/dL   GFR, Estimated 59 (L) >60 mL/min    Comment: (NOTE) Calculated using the CKD-EPI Creatinine Equation (2021)    Anion gap 10 5 - 15    Comment: Performed at [facility], [address] [place], [place] [address]  Magnesium     Status: None   Collection Time: 08/30/22  6:07 AM  Result Value Ref Range   Magnesium 1.9 1.7 - 2.4 mg/dL    Comment: Performed at [facility], [address] [place], [place] [address]  Phosphorus     Status: None   Collection Time: 08/30/22  6:07 AM  Result Value Ref Range   Phosphorus 2.8 2.5 - 4.6 mg/dL    Comment: Performed at [facility], [address], [place], [place] [address]   DG Chest 2 View  Result Date: 08/29/2022 CLINICAL  DATA:  Shortness of breath. EXAM: CHEST - 2 VIEW COMPARISON:  Same day. FINDINGS: Stable cardiomegaly. Status post transcatheter aortic valve repair. Bilateral pleural effusions are noted with associated bibasilar atelectasis, left greater than right. Degenerative changes seen involving the left glenohumeral joint. IMPRESSION: Bilateral pleural effusions are noted with associated bibasilar atelectasis, left greater than right. Electronically Signed   By: Lupita Raider M.D.   On: 08/29/2022 18:16    Pending Labs Unresulted Labs (From admission, onward)     Start     Ordered   09/05/22 0500  Creatinine, serum  (enoxaparin (LOVENOX)    CrCl >/= 30 ml/min)  Weekly,   R     Comments: while on enoxaparin therapy    08/29/22  2140            Vitals/Pain Today's Vitals   08/30/22 0134 08/30/22 0430 08/30/22 0615 08/30/22 0715  BP:  121/73 127/70   Pulse:  81 88 87  Resp:  20 20 14   Temp: 98.1 F (36.7 C)  97.8 F (36.6 C)   TempSrc: Oral  Oral   SpO2:  94% 96% 93%  Weight:      Height:      PainSc:   0-No pain     Isolation Precautions No active isolations  Medications Medications  furosemide (LASIX) injection 20 mg (20 mg Intravenous Given 08/29/22 2218)  enoxaparin (LOVENOX) injection 40 mg (40 mg Subcutaneous Given 08/29/22 2218)  atorvastatin (LIPITOR) tablet 10 mg (10 mg Oral Given 08/29/22 2356)  aspirin EC tablet 81 mg (has no administration in time range)  buPROPion (WELLBUTRIN) tablet 100 mg (has no administration in time range)  DULoxetine (CYMBALTA) DR capsule 90 mg (has no administration in time range)  famotidine (PEPCID) tablet 20 mg (has no administration in time range)  ferrous sulfate tablet 325 mg (has no administration in time range)  gabapentin (NEURONTIN) capsule 300 mg (300 mg Oral Given 08/29/22 2356)  ipratropium-albuterol (DUONEB) 0.5-2.5 (3) MG/3ML nebulizer solution 3 mL (3 mLs Nebulization Given 08/30/22 0615)  midodrine (PROAMATINE) tablet 5 mg (5 mg Oral Given 08/29/22 2311)  ipratropium-albuterol (DUONEB) 0.5-2.5 (3) MG/3ML nebulizer solution 3 mL (3 mLs Nebulization Given 08/29/22 2218)    Mobility walks     Focused Assessments Cardiac Assessment Handoff:    Lab Results  Component Value Date   CKTOTAL 50 10/09/2008   CKMB 1.4 10/09/2008   TROPONINI 0.13 (H) 02/15/2015   No results found for: "DDIMER" Does the Patient currently have chest pain? No    R Recommendations: See Admitting Provider Note  Report given to:   Additional Notes: .

## 2022-08-30 NOTE — Consult Note (Signed)
NAME:  Nicole Graves, MRN:  [id number], DOB:  Dec 25, 1945, LOS: 0 ADMISSION DATE:  08/29/2022, CONSULTATION DATE:  08/30/22 REFERRING MD:  Hall-TRH, CHIEF COMPLAINT:  bilateral pleural effusions   History of Present Illness:  Nicole Graves is a 77 year old woman with a history of aortic stenosis status post TAVR, tricuspid regurgitation, SVT, status post ablation, CKD 3B, hypertension who presented with shortness of breath that has worsened over the last week.  She was in the hospital last week and received diuresis but was not sent home on diuretics due to orthostatic hypotension.  She had right-sided thoracentesis with pseudo exudative effusion.  In the emergency department she had a chest x-ray demonstrating bilateral pleural effusions and pulmonary critical care was consulted for evaluation.  She denies a history of malignancy or tobacco use.  She has never been on outpatient diuretics before.  She notes that she had a fall a few weeks ago but this was after turning her head only.  She has learned to sit up and stand up more slowly to prevent significant symptoms  Pertinent  Medical History  Aortic stenosis status post TAVR in 2023 Orthostatic hypotension Hypertension Hyperlipidemia Tricuspid regurgitation   Significant Hospital Events: Including procedures, antibiotic start and stop dates in addition to other pertinent events   Admitted 7/15  Interim History / Subjective:    Objective   Blood pressure 127/70, pulse 87, temperature 97.8 F (36.6 C), temperature source Oral, resp. rate 14, height 5\' 4"  (1.626 m), weight 81.6 kg, SpO2 93%.       No intake or output data in the 24 hours ending 08/30/22 0836 Filed Weights   08/29/22 2149  Weight: 81.6 kg    Examination: General: Elderly woman sitting up in bed no acute distress HENT: [place]/AT, eyes anicteric Lungs: Breathing comfortably on room air, reduced bibasilar breath sounds Cardiovascular: S1-S2, regular rate and rhythm Abdomen:  Obese, soft, nontender, hypoactive bowel sounds Extremities: Minimal pedal edema, no cyanosis Neuro: Awake alert, answering questions appropriately, able to sit up independently at edge of bed Derm: Warm, dry, no diffuse rashes   Bedside US exam-mild bilateral pleural effusions, body: Size.  No septations or loculations present fluid.  Both dependent posteriorly.  CXR personally reviewed-left greater than right pleural effusion  Resolved Hospital Problem list     Assessment & Plan:  Bilateral pleural effusions, simple appearing.  Previously drained right pseudoexudative effusion, possibly consistent with diuresis prior. -Recommend diuresis to euvolemia.  With her history of orthostatic hypotension she would likely require midodrine as a chronic med to help tolerate a home diuretic regimen - TED hose - If effusions fail to respond or enlarge despite diuresis to euvolemia would consider tapping them at that point.  I discussed this recommendation with her and with Dr. Benjamine Mola,  who agree.    Best Practice (right click and "Reselect all SmartList Selections" daily)   Per primary  Labs   CBC: Recent Labs  Lab 08/29/22 1749 08/30/22 0607  WBC 9.6 9.0  NEUTROABS 6.5  --   HGB 8.8* 8.7*  HCT 29.8* 29.2*  MCV 95.2 94.5  PLT 668* 597*    Basic Metabolic Panel: Recent Labs  Lab 08/29/22 1749 08/30/22 0607  NA 139 135  K 3.9 3.8  CL 101 101  CO2 24 24  GLUCOSE 102* 92  BUN 18 16  CREATININE 0.97 0.97  CALCIUM 8.7* 8.5*  MG  --  1.9  PHOS  --  2.8   GFR: Estimated Creatinine  Clearance: 48.6 mL/min (by C-G formula based on SCr of 0.97 mg/dL). Recent Labs  Lab 08/29/22 1749 08/30/22 0607  WBC 9.6 9.0    Liver Function Tests: Recent Labs  Lab 08/29/22 1749  AST 14*  ALT 11  ALKPHOS 84  BILITOT 0.4  PROT 6.2*  ALBUMIN 2.4*   No results for input(s): "LIPASE", "AMYLASE" in the last 168 hours. No results for input(s): "AMMONIA" in the last 168 hours.  ABG     Component Value Date/Time   PHART 7.402 03/12/2021 1107   PCO2ART 41.5 03/12/2021 1107   PO2ART 98.0 03/12/2021 1107   HCO3 25.3 03/12/2021 1107   TCO2 26 03/16/2021 1041   O2SAT 97.6 03/12/2021 1107     Coagulation Profile: No results for input(s): "INR", "PROTIME" in the last 168 hours.  Cardiac Enzymes: No results for input(s): "CKTOTAL", "CKMB", "CKMBINDEX", "TROPONINI" in the last 168 hours.  HbA1C: Hgb A1c MFr Bld  Date/Time Value Ref Range Status  09/23/2021 04:55 AM 4.9 4.8 - 5.6 % Final    Comment:    (NOTE) Pre diabetes:          5.7%-6.4%  Diabetes:              >6.4%  Glycemic control for   <7.0% adults with diabetes     CBG: No results for input(s): "GLUCAP" in the last 168 hours.  Review of Systems:   Review of Systems  Constitutional: Negative.   Respiratory:  Positive for shortness of breath. Negative for cough.   Gastrointestinal: Negative.      Past Medical History:  She,  has a past medical history of Anemia, Anxiety, Arthritis, Chronic lower back pain, Colon polyp (1999), Depression, GERD (gastroesophageal reflux disease), HTN (hypertension), Hyperlipidemia, Mild aortic insufficiency, Mild mitral regurgitation, Orthostatic hypotension, PAT (paroxysmal atrial tachycardia), PONV (postoperative nausea and vomiting), S/P TAVR (transcatheter aortic valve replacement) (03/16/2021), Severe aortic stenosis, Stage 3b chronic kidney disease (CKD) (HCC), SVT (supraventricular tachycardia), and TR (tricuspid regurgitation) (09/19/2016).   Surgical History:   Past Surgical History:  Procedure Laterality Date   CESAREAN SECTION  1964   X2   CHOLECYSTECTOMY     COSMETIC SURGERY  1980   removal extra fat inner thighs   EYE SURGERY Bilateral     Cataract extraction with IOL   INTRAOPERATIVE TRANSTHORACIC ECHOCARDIOGRAM N/A 03/16/2021   Procedure: INTRAOPERATIVE TRANSTHORACIC ECHOCARDIOGRAM;  Surgeon: Orbie Pyo, MD;  Location: [facility] OR;  Service: Open  Heart Surgery;  Laterality: N/A;   RIGHT HEART CATH AND CORONARY ANGIOGRAPHY N/A 02/17/2021   Procedure: RIGHT HEART CATH AND CORONARY ANGIOGRAPHY;  Surgeon: Orbie Pyo, MD;  [facility]: [facility];  Service: Cardiovascular;  Laterality: N/A;   SVT ABLATION N/A 06/27/2022   Procedure: SVT ABLATION;  Surgeon: Marinus Maw, MD;  Location: [facility];  Service: Cardiovascular;  Laterality: N/A;   TOTAL KNEE ARTHROPLASTY Right 11/30/2015   Procedure: RIGHT TOTAL KNEE ARTHROPLASTY;  Surgeon: Ollen Gross, MD;  Location: [facility] ORS;  Service: Orthopedics;  Laterality: Right;   TOTAL KNEE ARTHROPLASTY Left 11/14/2016   Procedure: LEFT TOTAL KNEE ARTHROPLASTY;  Surgeon: Ollen Gross, MD;  Location: [facility]S;  Service: Orthopedics;  Laterality: Left;   TRANSCATHETER AORTIC VALVE REPLACEMENT, TRANSFEMORAL Right 03/16/2021   Procedure: TRANSCATHETER AORTIC VALVE REPLACEMENT, TRANSFEMORAL;  Surgeon: Orbie Pyo, MD;  Location: [facility] OR;  Service: Open Heart Surgery;  Laterality: Right;     Social History:   reports that she has never  smoked. She has never used smokeless tobacco. She reports that she does not drink alcohol and does not use drugs.   Family History:  Her family history includes Colon cancer in her mother; Heart attack in her brother; Hypertension in her brother. There is no history of Stroke.   Allergies Allergies  Allergen Reactions   Sulfa Antibiotics Nausea And Vomiting and Other (See Comments)   Ativan [Lorazepam] Other (See Comments)    Hyperactive    Tizanidine Hcl Other (See Comments)    Pt does not remember      Home Medications  Prior to Admission medications   Medication Sig Start Date End Date Taking? Authorizing Provider  acetaminophen (TYLENOL) 650 MG CR tablet Take 1,300 mg by mouth in the morning and at bedtime.   Yes [provider]  alendronate (FOSAMAX) 70 MG tablet Take 70 mg by mouth every Thursday.   Yes [provider]   aspirin EC 81 MG tablet Take 81 mg by mouth daily. Swallow whole.   Yes [provider]  atorvastatin (LIPITOR) 10 MG tablet Take 10 mg by mouth at bedtime.  02/11/15  Yes [provider]  buPROPion (WELLBUTRIN) 100 MG tablet Take 100 mg by mouth daily.   Yes [provider]  cyclobenzaprine (FLEXERIL) 10 MG tablet Take 1 tablet (10 mg total) by mouth at bedtime. 04/14/22  Yes Medina-Vargas, Monina C, NP  DULoxetine (CYMBALTA) 30 MG capsule Take 90 mg by mouth daily after breakfast.   Yes [provider]  famotidine (PEPCID) 20 MG tablet Take 20 mg by mouth daily as needed for heartburn.   Yes [provider]  ferrous sulfate 325 (65 FE) MG tablet Take 325 mg by mouth daily.   Yes [provider]  gabapentin (NEURONTIN) 300 MG capsule Take 300 mg by mouth 3 (three) times daily. 12/20/19  Yes [provider]  Melatonin 5 MG CAPS Take 1 capsule by mouth at bedtime.   Yes [provider]  metoprolol succinate (TOPROL-XL) 50 MG 24 hr tablet Take 1 tablet (50 mg total) by mouth daily. Take with or immediately following a meal. You may take an extra 50 mg dose if you experience palpitations. 04/20/22  Yes Meriam Sprague, MD  lidocaine (LIDODERM) 5 % Place 1 patch onto the skin daily. Remove & Discard patch within 12 hours or as directed by MD. Apply to site of pain Patient not taking: Reported on 08/29/2022 09/27/21   Joycelyn Das, MD     Critical care time: n/a    Steffanie Dunn, DO 08/30/22 8:38 AM [facility]  For contact information, see Amion. If no response to pager, please call PCCM consult pager. After hours, 7PM- 7AM, please call Elink.

## 2022-08-30 NOTE — Plan of Care (Signed)

## 2022-08-30 NOTE — Progress Notes (Addendum)
PROGRESS NOTE    Nicole Graves  SAY:[id number] DOB: 1945-09-20 DOA: 08/29/2022 PCP: [name], MD    Brief Narrative:  Nicole Graves is a 77 y.o. female with medical history significant for SVT status post ablation on 06/27/2022, severe AS status post TAVR in 2023, chronic HFpEF, CKD stage IIIa, hyperlipidemia, chronic anxiety/depression, recently admitted from 08/19/2022 through 08/22/2022 for bilateral pleural effusions, status post right thoracentesis, who presents to the ED sent from her primary care provider due to recurrent bilateral pleural effusions left greater than right seen on chest x-ray done at PCPs office.  The patient endorses exertional dyspnea.  Associated with audible wheezing.    Assessment and Plan: Recurrent bilateral pleural effusions, left greater than right unclear etiology Suspect related to congestive heart failure Last admission, the patient had right thoracentesis Pulmonary consulted and recommend diuresis prior to another thoracentesis (re-consult as needed)-- IV lasix and midodrine-- watch orthostatic vital sign Continue IV diuresing as tolerated (ordered for today and will need to be REORDERED in the AM for tomorrow) -repeat x ray in 24-48 hours pending diuresis Incentive spirometer -TED hose, may need abdominal binder   Exertional dyspnea, likely secondary to the above DuoNebs for dyspnea and wheezing Continue to treat underlying conditions IV diuresing Mobilize as tolerated   Acute on chronic HFpEF Recent 2D echo on 08/21/2022 which showed LVEF 60 to 65% Trace lower extremity edema Continue IV diuresing, closely monitor blood pressure and electrolytes while on IV diuretics. Start strict I's and O's and daily weight   Chronic anxiety/depression Resume home regimen.   Iron deficiency anemia Hemoglobin is stable, resume home iron supplement No reported overt bleeding   Peripheral neuropathy Resume home gabapentin   Physical debility PT OT  assessment Orthostatic/Fall precautions. -lives with daughter and grandkids in [place]     obesity Estimated body mass index is 30.9 kg/m as calculated from the following:   Height as of this encounter: 5\' 4"  (1.626 m).   Weight as of this encounter: 81.6 kg.    DVT prophylaxis: Place TED hose Start: 08/30/22 0837 enoxaparin (LOVENOX) injection 40 mg Start: 08/29/22 2200    Code Status: Full Code  Disposition Plan:  Level of care: Telemetry Cardiac Status is: Observation The patient will require care spanning > 2 midnights and should be moved to inpatient    Consultants:  pulm   Subjective: No chest pain, agreeable to plan of IV diuresis and monitoring of orthostatic symptoms  Objective: Vitals:   08/30/22 1000 08/30/22 1015 08/30/22 1030 08/30/22 1045  BP: 101/78  94/72 114/78  Pulse: 92 (!) 104 (!) 109 91  Resp: 17  (!) 24 18  Temp:      TempSrc:      SpO2: 95% 91% 94% 93%  Weight:      Height:        Intake/Output Summary (Last 24 hours) at 08/30/2022 1058 Last data filed at 08/30/2022 2355 Gross per 24 hour  Intake --  Output 1000 ml  Net -1000 ml   Filed Weights   08/29/22 2149  Weight: 81.6 kg    Examination:   General: Appearance:    Obese female in no acute distress     Lungs:     Diminished, respirations unlabored  Heart:    Normal heart rate. Normal rhythm.     MS:   All extremities are intact.    Neurologic:   Awake, alert       Data Reviewed: I have personally reviewed  following labs and imaging studies  CBC: Recent Labs  Lab 08/29/22 1749 08/30/22 0607  WBC 9.6 9.0  NEUTROABS 6.5  --   HGB 8.8* 8.7*  HCT 29.8* 29.2*  MCV 95.2 94.5  PLT 668* 597*   Basic Metabolic Panel: Recent Labs  Lab 08/29/22 1749 08/30/22 0607  NA 139 135  K 3.9 3.8  CL 101 101  CO2 24 24  GLUCOSE 102* 92  BUN 18 16  CREATININE 0.97 0.97  CALCIUM 8.7* 8.5*  MG  --  1.9  PHOS  --  2.8   GFR: Estimated Creatinine Clearance: 48.6  mL/min (by C-G formula based on SCr of 0.97 mg/dL). Liver Function Tests: Recent Labs  Lab 08/29/22 1749  AST 14*  ALT 11  ALKPHOS 84  BILITOT 0.4  PROT 6.2*  ALBUMIN 2.4*   No results for input(s): "LIPASE", "AMYLASE" in the last 168 hours. No results for input(s): "AMMONIA" in the last 168 hours. Coagulation Profile: No results for input(s): "INR", "PROTIME" in the last 168 hours. Cardiac Enzymes: No results for input(s): "CKTOTAL", "CKMB", "CKMBINDEX", "TROPONINI" in the last 168 hours. BNP (last 3 results) No results for input(s): "PROBNP" in the last 8760 hours. HbA1C: No results for input(s): "HGBA1C" in the last 72 hours. CBG: No results for input(s): "GLUCAP" in the last 168 hours. Lipid Profile: No results for input(s): "CHOL", "HDL", "LDLCALC", "TRIG", "CHOLHDL", "LDLDIRECT" in the last 72 hours. Thyroid Function Tests: No results for input(s): "TSH", "T4TOTAL", "FREET4", "T3FREE", "THYROIDAB" in the last 72 hours. Anemia Panel: No results for input(s): "VITAMINB12", "FOLATE", "FERRITIN", "TIBC", "IRON", "RETICCTPCT" in the last 72 hours. Sepsis Labs: No results for input(s): "PROCALCITON", "LATICACIDVEN" in the last 168 hours.  No results found for this or any previous visit (from the past 240 hour(s)).       Radiology Studies: DG Chest 2 View  Result Date: 08/29/2022 CLINICAL DATA:  Shortness of breath. EXAM: CHEST - 2 VIEW COMPARISON:  Same day. FINDINGS: Stable cardiomegaly. Status post transcatheter aortic valve repair. Bilateral pleural effusions are noted with associated bibasilar atelectasis, left greater than right. Degenerative changes seen involving the left glenohumeral joint. IMPRESSION: Bilateral pleural effusions are noted with associated bibasilar atelectasis, left greater than right. Electronically Signed   By: Lupita Raider M.D.   On: 08/29/2022 18:16        Scheduled Meds:  aspirin EC  81 mg Oral Daily   atorvastatin  10 mg Oral QHS    buPROPion  100 mg Oral Daily   DULoxetine  90 mg Oral Daily   enoxaparin (LOVENOX) injection  40 mg Subcutaneous Q24H   ferrous sulfate  325 mg Oral Q breakfast   furosemide  60 mg Intravenous Q12H   gabapentin  300 mg Oral TID   ipratropium-albuterol  3 mL Nebulization Q6H   midodrine  5 mg Oral TID WC   Continuous Infusions:   LOS: 0 days    Time spent: 45 minutes spent on chart review, discussion with nursing staff, consultants, updating family and interview/physical exam; more than 50% of that time was spent in counseling and/or coordination of care.    Joseph Art, DO [facility] Available via Epic secure chat 7am-7pm After these hours, please refer to coverage provider listed on [email or web address] 08/30/2022, 10:58 AM

## 2022-08-31 ENCOUNTER — Inpatient Hospital Stay (HOSPITAL_COMMUNITY): Payer: Medicare PPO

## 2022-08-31 DIAGNOSIS — I951 Orthostatic hypotension: Secondary | ICD-10-CM | POA: Diagnosis not present

## 2022-08-31 DIAGNOSIS — R490 Dysphonia: Secondary | ICD-10-CM | POA: Diagnosis not present

## 2022-08-31 DIAGNOSIS — J90 Pleural effusion, not elsewhere classified: Secondary | ICD-10-CM | POA: Diagnosis not present

## 2022-08-31 LAB — BASIC METABOLIC PANEL
Anion gap: 13 (ref 5–15)
BUN: 28 mg/dL — ABNORMAL HIGH (ref 8–23)
CO2: 25 mmol/L (ref 22–32)
Calcium: 8.6 mg/dL — ABNORMAL LOW (ref 8.9–10.3)
Chloride: 97 mmol/L — ABNORMAL LOW (ref 98–111)
Creatinine, Ser: 1.39 mg/dL — ABNORMAL HIGH (ref 0.44–1.00)
GFR, Estimated: 39 mL/min — ABNORMAL LOW (ref >60)
Glucose, Bld: 104 mg/dL — ABNORMAL HIGH (ref 70–99)
Potassium: 3.5 mmol/L (ref 3.5–5.1)
Sodium: 135 mmol/L (ref 135–145)

## 2022-08-31 LAB — MAGNESIUM: Magnesium: 1.8 mg/dL (ref 1.7–2.4)

## 2022-08-31 MED ORDER — IPRATROPIUM-ALBUTEROL 0.5-2.5 (3) MG/3ML IN SOLN
3.0000 mL | Freq: Two times a day (BID) | RESPIRATORY_TRACT | Status: DC
  Administered 2022-09-01: 3 mL via RESPIRATORY_TRACT
  Filled 2022-08-31 (×2): qty 3

## 2022-08-31 MED ORDER — FUROSEMIDE 10 MG/ML IJ SOLN
40.0000 mg | Freq: Once | INTRAMUSCULAR | Status: AC
  Administered 2022-08-31: 40 mg via INTRAVENOUS
  Filled 2022-08-31: qty 4

## 2022-08-31 MED ORDER — ALBUTEROL SULFATE (2.5 MG/3ML) 0.083% IN NEBU: 2.5000 mg | INHALATION_SOLUTION | Freq: Four times a day (QID) | RESPIRATORY_TRACT | Status: DC | PRN

## 2022-08-31 NOTE — Progress Notes (Signed)
Mobility Specialist: Progress Note   08/31/22 1027  Mobility  Activity Ambulated with assistance in room  Level of Assistance Standby assist, set-up cues, supervision of patient - no hands on  Assistive Device None  Distance Ambulated (ft) 350 ft  Activity Response Tolerated well  Mobility Referral Yes  $Mobility charge 1 Mobility  Mobility Specialist Start Time (ACUTE ONLY) 1015  Mobility Specialist Stop Time (ACUTE ONLY) 1025  Mobility Specialist Time Calculation (min) (ACUTE ONLY) 10 min    During Mobility: HR 137, SpO2 97% RA Post-Mobility: HR 108  Pt was agreeable to mobility session. Ambulated at a supervision level. C/o SOB and fatigue during ambulation. HR elevated to 137. Left in chair with all needs met.   Maurene Capes Mobility Specialist Please contact via SecureChat or Rehab office at [phone]

## 2022-08-31 NOTE — Plan of Care (Signed)

## 2022-08-31 NOTE — Progress Notes (Signed)
PROGRESS NOTE  Nicole Graves GNF:[id number] DOB: 12-10-1945 DOA: 08/29/2022 PCP: [name], MD  Brief History:   77 y.o. female with medical history significant for SVT status post ablation on 06/27/2022, severe AS status post TAVR in 2023, chronic HFpEF, CKD stage IIIa, hyperlipidemia, chronic anxiety/depression, recently admitted from 08/19/2022 through 08/22/2022 for bilateral pleural effusions, status post right thoracentesis, who presents to the ED sent from her primary care provider due to recurrent bilateral pleural effusions left greater than right seen on chest x-ray done at PCPs office.  The patient endorses exertional dyspnea. The patient was started on IV lasix.  PCCM was consulted to assist.  Pt remained dyspneic despite diuresis with rising serum creatinine.  As a result, PCCM was reconsulted  Assessment/Plan: Recurrent bilateral pleural effusions, left greater than right unclear etiology Suspect related to congestive heart failure Last admission, the patient had right thoracentesis Pulmonary consulted and recommend diuresis prior to another thoracentesis (re-consult as needed)-- IV lasix and midodrine-- watch orthostatic vital sign Continue IV diuresing as tolerated (ordered for today and will need to be REORDERED in the AM for tomorrow) -repeating CXR Incentive spirometer -TED hose, may need abdominal binder -pt remains quite dyspneic, does not feel symptomatically better -7/17--reconsult PCCM -7/17--repeat CXR   Exertional dyspnea, likely secondary to the above DuoNebs for dyspnea and wheezing Continue to treat underlying conditions IV diuresing Mobilize as tolerated -7/17--reconsult PCCM   Acute on chronic HFpEF Recent 2D echo on 08/21/2022 which showed LVEF 60 to 65% Trace lower extremity edema Continue IV diuresing, closely monitor blood pressure and electrolytes while on IV diuretics. Start strict I's and O's and daily weight -NEG 5 lbs   Chronic  anxiety/depression Resume home regimen.   Iron deficiency anemia Hemoglobin is stable, resume home iron supplement No reported overt bleeding   Peripheral neuropathy Resume home gabapentin   Physical debility PT OT assessment Orthostatic/Fall precautions. -lives with daughter and grandkids in [place]     obesity Estimated body mass index is 30.9 kg/m as calculated from the following:   Height as of this encounter: 5\' 4"  (1.626 m).   Weight as of this encounter: 81.6 kg.         Family Communication:  no Family at bedside  Consultants:  PCCM  Code Status:  FULL   DVT Prophylaxis:   [place] Lovenox   Procedures: As Listed in Progress Note Above  Antibiotics: None        Subjective: She remains sob.  Denies f/c, cp, n/v/d  Objective: Vitals:   08/31/22 0715 08/31/22 0814 08/31/22 0910 08/31/22 1106  BP:  123/70 (!) 112/94 115/68  Pulse:  (!) 123 (!) 45 80  Resp:  17 16 17   Temp:  98 F (36.7 C) 98 F (36.7 C) (!) 97.3 F (36.3 C)  TempSrc:  Oral Oral Oral  SpO2: 96% 98% 93% 96%  Weight:      Height:        Intake/Output Summary (Last 24 hours) at 08/31/2022 1450 Last data filed at 08/31/2022 0800 Gross per 24 hour  Intake 240 ml  Output --  Net 240 ml   Weight change: -1.048 kg Exam:  General:  Pt is alert, follows commands appropriately, not in acute distress HEENT: No icterus, No thrush, No neck mass, [place]/AT Cardiovascular:bibasilar crackles. No wheeze Abdomen: Soft/+BS, non tender, non distended, no guarding Extremities: No edema, No lymphangitis, No petechiae, No rashes, no synovitis   Data  Reviewed: I have personally reviewed following labs and imaging studies Basic Metabolic Panel: Recent Labs  Lab 08/29/22 1749 08/30/22 0607 08/31/22 0721  NA 139 135 135  K 3.9 3.8 3.5  CL 101 101 97*  CO2 24 24 25   GLUCOSE 102* 92 104*  BUN 18 16 28*  CREATININE 0.97 0.97 1.39*  CALCIUM 8.7* 8.5* 8.6*  MG  --  1.9 1.8  PHOS  --  2.8   --    Liver Function Tests: Recent Labs  Lab 08/29/22 1749  AST 14*  ALT 11  ALKPHOS 84  BILITOT 0.4  PROT 6.2*  ALBUMIN 2.4*   No results for input(s): "LIPASE", "AMYLASE" in the last 168 hours. No results for input(s): "AMMONIA" in the last 168 hours. Coagulation Profile: No results for input(s): "INR", "PROTIME" in the last 168 hours. CBC: Recent Labs  Lab 08/29/22 1749 08/30/22 0607  WBC 9.6 9.0  NEUTROABS 6.5  --   HGB 8.8* 8.7*  HCT 29.8* 29.2*  MCV 95.2 94.5  PLT 668* 597*   Cardiac Enzymes: No results for input(s): "CKTOTAL", "CKMB", "CKMBINDEX", "TROPONINI" in the last 168 hours. BNP: Invalid input(s): "POCBNP" CBG: No results for input(s): "GLUCAP" in the last 168 hours. HbA1C: No results for input(s): "HGBA1C" in the last 72 hours. Urine analysis:    Component Value Date/Time   COLORURINE YELLOW 09/22/2021 1716   APPEARANCEUR CLEAR 09/22/2021 1716   LABSPEC 1.008 09/22/2021 1716   PHURINE 5.0 09/22/2021 1716   GLUCOSEU NEGATIVE 09/22/2021 1716   HGBUR NEGATIVE 09/22/2021 1716   BILIRUBINUR NEGATIVE 09/22/2021 1716   KETONESUR NEGATIVE 09/22/2021 1716   PROTEINUR NEGATIVE 09/22/2021 1716   UROBILINOGEN 0.2 10/08/2008 1021   NITRITE NEGATIVE 09/22/2021 1716   LEUKOCYTESUR NEGATIVE 09/22/2021 1716   Sepsis Labs: @LABRCNTIP (procalcitonin:4,lacticidven:4) )No results found for this or any previous visit (from the past 240 hour(s)).   Scheduled Meds:  acetaminophen  1,000 mg Oral Q6H   aspirin EC  81 mg Oral Daily   atorvastatin  10 mg Oral QHS   buPROPion  100 mg Oral Daily   DULoxetine  90 mg Oral Daily   enoxaparin (LOVENOX) injection  40 mg Subcutaneous Q24H   ferrous sulfate  325 mg Oral Q breakfast   gabapentin  300 mg Oral TID   ipratropium-albuterol  3 mL Nebulization BID   midodrine  5 mg Oral TID WC   Continuous Infusions:  Procedures/Studies: DG Chest 2 View  Result Date: 08/29/2022 CLINICAL DATA:  Shortness of breath. EXAM:  CHEST - 2 VIEW COMPARISON:  Same day. FINDINGS: Stable cardiomegaly. Status post transcatheter aortic valve repair. Bilateral pleural effusions are noted with associated bibasilar atelectasis, left greater than right. Degenerative changes seen involving the left glenohumeral joint. IMPRESSION: Bilateral pleural effusions are noted with associated bibasilar atelectasis, left greater than right. Electronically Signed   By: Lupita Raider M.D.   On: 08/29/2022 18:16   DG CHEST PORT 1 VIEW  Result Date: 08/22/2022 CLINICAL DATA:  142230 Pleural effusion 142230 EXAM: PORTABLE CHEST 1 VIEW COMPARISON:  CXR 08/20/22 FINDINGS: Postprocedural changes from TAVR. Small bilateral pleural effusions, unchanged from prior exam. Cardiomegaly. Hazy bibasilar airspace opacities most likely represent atelectasis, but infection is not excluded. No radiographically apparent displaced rib fractures. Visualized upper is unremarkable. IMPRESSION: Small bilateral pleural effusions and superimposed hazy airspace opacities, unchanged from prior exam. Electronically Signed   By: Lorenza Cambridge M.D.   On: 08/22/2022 08:12   ECHOCARDIOGRAM COMPLETE  Result Date: 08/21/2022  ECHOCARDIOGRAM REPORT   Patient Name:   BRANDON SCARBROUGH [facility] Date of Exam: 08/21/2022 Medical Rec #:  [id number]    Height:       63.0 in Accession #:    [id number]   Weight:       177.4 lb Date of Birth:  05-11-1945    BSA:          1.838 m Patient Age:    77 years     BP:           115/63 mmHg Patient Gender: F            HR:           99 bpm. Exam Location:  Inpatient Procedure: 2D Echo, Cardiac Doppler and Color Doppler Indications:    Dyspnea R06.00  History:        Patient has prior history of Echocardiogram examinations, most                 recent 03/18/2022. Arrythmias:Tachycardia; Risk                 Factors:Dyslipidemia and Diabetes. CKD, stage 3.                 Aortic Valve: 29 mm CoreValve-Evolut Pro prosthetic, stented                 (TAVR) valve is present in the  aortic position. Procedure Date:                 03/16/21.  Sonographer:    Lucendia Herrlich Referring Phys: [id number] VISHAL R PATEL IMPRESSIONS  1. Left ventricular ejection fraction, by estimation, is 60 to 65%. The left ventricle has normal function. The left ventricle has no regional wall motion abnormalities. Left ventricular diastolic parameters are consistent with Grade I diastolic dysfunction (impaired relaxation).  2. Right ventricular systolic function is normal. The right ventricular size is normal. There is mildly elevated pulmonary artery systolic pressure. The estimated right ventricular systolic pressure is 42.7 mmHg.  3. The mitral valve is degenerative. Trivial mitral valve regurgitation.  4. The aortic valve has been repaired/replaced. Aortic valve regurgitation is not visualized. There is a 29 mm CoreValve-Evolut Pro prosthetic (TAVR) valve present in the aortic position. Procedure Date: 03/16/21.  5. The inferior vena cava is normal in size with greater than 50% respiratory variability, suggesting right atrial pressure of 3 mmHg. Comparison(s): Prior images reviewed side by side. LVEF remains normal at 60-65%. TAVR stable with peak gradient 13 mmHg and no paravalvular regurgitation. FINDINGS  Left Ventricle: Left ventricular ejection fraction, by estimation, is 60 to 65%. The left ventricle has normal function. The left ventricle has no regional wall motion abnormalities. The left ventricular internal cavity size was normal in size. There is  no left ventricular hypertrophy. Left ventricular diastolic parameters are consistent with Grade I diastolic dysfunction (impaired relaxation). Right Ventricle: The right ventricular size is normal. No increase in right ventricular wall thickness. Right ventricular systolic function is normal. There is mildly elevated pulmonary artery systolic pressure. The tricuspid regurgitant velocity is 3.15  m/s, and with an assumed right atrial pressure of 3 mmHg, the  estimated right ventricular systolic pressure is 42.7 mmHg. Left Atrium: Left atrial size was normal in size. Right Atrium: Right atrial size was normal in size. Pericardium: There is no evidence of pericardial effusion. Presence of epicardial fat layer. Mitral Valve: The mitral valve is degenerative in appearance. There is mild calcification of  the mitral valve leaflet(s). Trivial mitral valve regurgitation. Tricuspid Valve: The tricuspid valve is grossly normal. Tricuspid valve regurgitation is mild. Aortic Valve: The aortic valve has been repaired/replaced. Aortic valve regurgitation is not visualized. Aortic valve peak gradient measures 13.2 mmHg. There is a 29 mm CoreValve-Evolut Pro prosthetic, stented (TAVR) valve present in the aortic position.  Procedure Date: 03/16/21. Pulmonic Valve: The pulmonic valve was not well visualized. Pulmonic valve regurgitation is trivial. Aorta: The aortic root is normal in size and structure. Venous: The inferior vena cava is normal in size with greater than 50% respiratory variability, suggesting right atrial pressure of 3 mmHg. IAS/Shunts: No atrial level shunt detected by color flow Doppler.  LEFT VENTRICLE PLAX 2D LVIDd:         4.30 cm   Diastology LVIDs:         2.80 cm   LV e' medial:    10.70 cm/s LV PW:         0.70 cm   LV E/e' medial:  10.6 LV IVS:        1.00 cm   LV e' lateral:   9.64 cm/s LVOT diam:     2.00 cm   LV E/e' lateral: 11.7 LV SV:         60 LV SV Index:   33 LVOT Area:     3.14 cm  RIGHT VENTRICLE             IVC RV S prime:     12.20 cm/s  IVC diam: 1.70 cm TAPSE (M-mode): 1.0 cm LEFT ATRIUM             Index        RIGHT ATRIUM           Index LA diam:        3.20 cm 1.74 cm/m   RA Area:     15.00 cm LA Vol (A2C):   49.4 ml 26.88 ml/m  RA Volume:   36.00 ml  19.59 ml/m LA Vol (A4C):   44.5 ml 24.22 ml/m LA Biplane Vol: 48.7 ml 26.50 ml/m  AORTIC VALVE AV Area (Vmax): 1.88 cm AV Vmax:        182.00 cm/s AV Peak Grad:   13.2 mmHg LVOT Vmax:       108.67 cm/s LVOT Vmean:     71.000 cm/s LVOT VTI:       0.191 m  AORTA Ao Root diam: 2.30 cm Ao Asc diam:  2.20 cm MITRAL VALVE                TRICUSPID VALVE MV Area (PHT): 4.49 cm     TR Peak grad:   39.7 mmHg MV Decel Time: 169 msec     TR Vmax:        315.00 cm/s MV E velocity: 113.00 cm/s MV A velocity: 106.00 cm/s  SHUNTS MV E/A ratio:  1.07         Systemic VTI:  0.19 m                             Systemic Diam: 2.00 cm Nona Dell MD Electronically signed by Nona Dell MD Signature Date/Time: 08/21/2022/5:03:42 PM    Final    DG CHEST PORT 1 VIEW  Result Date: 08/20/2022 CLINICAL DATA:  Bilateral pleural effusion. EXAM: PORTABLE CHEST 1 VIEW COMPARISON:  Earlier today FINDINGS: Pleural and parenchymal opacification in the bilateral lower  chest, pleural fluid and atelectasis by most recent chest CT. Cardiopericardial enlargement. There is a superimposed large hiatal hernia by CT. Transcatheter aortic valve replacement. IMPRESSION: Known bilateral pleural effusion and atelectasis. Worsening aeration at the right base. Electronically Signed   By: Tiburcio Pea M.D.   On: 08/20/2022 08:16   DG Chest Port 1 View  Result Date: 08/20/2022 CLINICAL DATA:  Status post thoracentesis EXAM: PORTABLE CHEST 1 VIEW COMPARISON:  08/19/2022 FINDINGS: Moderate left pleural effusion remains. Decreasing right pleural effusion following thoracentesis. No pneumothorax. Cardiomegaly. Status post TAVR. Left lower lobe atelectasis. IMPRESSION: Decreasing right effusion and improving right base atelectasis following thoracentesis. No pneumothorax. Continued moderate left effusion with left base atelectasis. Electronically Signed   By: Charlett Nose M.D.   On: 08/20/2022 00:19   CT Angio Chest PE W and/or Wo Contrast  Result Date: 08/19/2022 CLINICAL DATA:  Trauma bilateral effusions w/ unclear etiology. Shortness of breath, chest pain. Fall 2 weeks ago. EXAM: CT ANGIOGRAPHY CHEST WITH CONTRAST TECHNIQUE:  Multidetector CT imaging of the chest was performed using the standard protocol during bolus administration of intravenous contrast. Multiplanar CT image reconstructions and MIPs were obtained to evaluate the vascular anatomy. RADIATION DOSE REDUCTION: This exam was performed according to the departmental dose-optimization program which includes automated exposure control, adjustment of the mA and/or kV according to patient size and/or use of iterative reconstruction technique. CONTRAST:  55mL OMNIPAQUE IOHEXOL 350 MG/ML SOLN COMPARISON:  03/02/2021 FINDINGS: Cardiovascular: No filling defects in the pulmonary arteries to suggest pulmonary emboli. Mild cardiomegaly. Prior TAVR. Scattered coronary artery and aortic calcifications. No aneurysm. Mediastinum/Nodes: No mediastinal, hilar, or axillary adenopathy. Trachea and esophagus are unremarkable. Thyroid unremarkable. Lungs/Pleura: Large bilateral pleural effusions. Compressive atelectasis in the lower lobes. Small pericardial effusion. Upper Abdomen: Large hiatal hernia.  No acute findings. Musculoskeletal: Chest wall soft tissues are unremarkable. No acute bony abnormality. Review of the MIP images confirms the above findings. IMPRESSION: No evidence of pulmonary embolus. Large bilateral pleural effusions with compressive atelectasis in the lower lobes. Large hiatal hernia. Scattered coronary artery disease. Aortic Atherosclerosis (ICD10-I70.0). Electronically Signed   By: Charlett Nose M.D.   On: 08/19/2022 20:13   DG Chest Portable 1 View  Result Date: 08/19/2022 CLINICAL DATA:  Chest pain, initial encounter EXAM: PORTABLE CHEST 1 VIEW COMPARISON:  08/16/2022 FINDINGS: Cardiac shadow is stable. TAVR is again noted. Small effusions are again seen right greater than left. Mild bibasilar atelectatic changes are seen. IMPRESSION: Small effusions and bibasilar atelectasis. Electronically Signed   By: Alcide Clever M.D.   On: 08/19/2022 19:27   DG Elbow 2 Views  Right  Result Date: 08/19/2022 CLINICAL DATA:  Recent fall with right elbow pain, initial encounter EXAM: RIGHT ELBOW - 2 VIEW COMPARISON:  None Available. FINDINGS: There is no evidence of fracture, dislocation, or joint effusion. There is no evidence of arthropathy or other focal bone abnormality. Soft tissues are unremarkable. IMPRESSION: No acute abnormality noted. Electronically Signed   By: Alcide Clever M.D.   On: 08/19/2022 19:26   DG Shoulder Right  Result Date: 08/19/2022 CLINICAL DATA:  Recent fall with right shoulder pain, initial encounter EXAM: RIGHT SHOULDER - 2+ VIEW COMPARISON:  None Available. FINDINGS: Degenerative changes of the acromioclavicular joint are seen. Humeral head is high-riding related to underlying rotator cuff injury. Right-sided pleural effusion is noted. No other focal abnormality is seen. IMPRESSION: Degenerative changes of the right shoulder joint. Right pleural effusion. Electronically Signed   By: Alcide Clever  M.D.   On: 08/19/2022 19:25   DG Humerus Right  Result Date: 08/19/2022 CLINICAL DATA:  Recent fall with right arm pain, initial encounter EXAM: RIGHT HUMERUS - 2+ VIEW COMPARISON:  None Available. FINDINGS: Humeral head is high-riding likely related to chronic rotator cuff injury. No acute fracture or dislocation is noted. No soft tissue abnormality is seen IMPRESSION: Chronic changes without acute abnormality. Electronically Signed   By: Alcide Clever M.D.   On: 08/19/2022 19:24   DG Chest 2 View  Result Date: 08/19/2022 CLINICAL DATA:  Fall 2 weeks ago with shortness of breath EXAM: CHEST - 2 VIEW COMPARISON:  06/11/2022 FINDINGS: Cardiac shadow is stable. TAVR is again seen. Lungs are well aerated bilaterally with small effusions posteriorly. No focal infiltrate is seen. IMPRESSION: Small effusions bilaterally. Electronically Signed   By: Alcide Clever M.D.   On: 08/19/2022 17:15    Catarina Hartshorn, DO  [facility]  If 7PM-7AM, please contact  night-coverage [email or web address] Password [email or web address] 08/31/2022, 2:50 PM   LOS: 1 day

## 2022-08-31 NOTE — TOC Initial Note (Signed)
Transition of Care [facility]) - Initial/Assessment Note    Patient Details  Name: Nicole Graves MRN: [id number] Date of Birth: 1945/04/30  [facility]) CM/SW Contact:    Nicole Haven, RN Phone Number: 08/31/2022, 12:39 PM  Clinical Narrative:                 From home with daughter , Nicole Graves, and daughter's family, PCP is Dr. Hyacinth Graves, has insurance on file.  She states she has no HH services in place at this time.  Per pt/ot eval rec HHPT, HHOT. NCM offered choice, she has no preference.  NCM made referral to Nicole Graves with [facility], she is able to take referral for HHPT, HHOT.  Soc will begin 24 to 48 hrs post dc.  Patient has walker, bsc, cane at home.  She states her daughter , Nicole Graves will transport her home at dc,  Nicole Graves is her support system as well.  She gets her medications from [employer] order and also [facility] [address] in [place].  Pta she is self ambulatory.    Expected Discharge Plan: Home w Home Health Services Barriers to Discharge: Continued Medical Work up   Patient Goals and CMS Choice Patient states their goals for this hospitalization and ongoing recovery are:: return home CMS Medicare.gov Compare Post Acute Care list provided to:: Patient Choice offered to / list presented to : Patient      Expected Discharge Plan and Services In-house Referral: NA Discharge Planning Services: CM Consult Post Acute Care Choice: Home Health Living arrangements for the past 2 months: Single Family Home                 DME Arranged: N/A DME Agency: NA       HH Arranged: PT, OT HH Agency: [facility], [facility] Date HH Agency Contacted: 08/31/22 Time HH Agency Contacted: 1238 Representative spoke with at [facility] Agency: Nicole Graves  Prior Living Arrangements/Services Living arrangements for the past 2 months: Single Family Home Lives with:: Adult Children Patient language and need for interpreter reviewed:: Yes Do you feel safe going back to the  place where you live?: Yes      Need for Family Participation in Patient Care: Yes (Comment) Care giver support system in place?: Yes (comment) Current home services: DME (walker, bsc, cane) Criminal Activity/Legal Involvement Pertinent to Current Situation/Hospitalization: No - Comment as needed  Activities of Daily Living Home Assistive Devices/Equipment: None ADL Screening (condition at time of admission) Patient's cognitive ability adequate to safely complete daily activities?: Yes Is the patient deaf or have difficulty hearing?: No Does the patient have difficulty seeing, even when wearing glasses/contacts?: No Does the patient have difficulty concentrating, remembering, or making decisions?: No Patient able to express need for assistance with ADLs?: Yes Does the patient have difficulty dressing or bathing?: No Independently performs ADLs?: Yes (appropriate for developmental age) Does the patient have difficulty walking or climbing stairs?: No Weakness of Legs: Both Weakness of Arms/Hands: Both  Permission Sought/Granted Permission sought to share information with : Case Manager Permission granted to share information with : Yes, Verbal Permission Granted     Permission granted to share info w AGENCY: [facility]        Emotional Assessment Appearance:: Appears stated age Attitude/Demeanor/Rapport: Engaged Affect (typically observed): Appropriate Orientation: : Oriented to Self, Oriented to Place, Oriented to  Time, Oriented to Situation Alcohol / Substance Use: Not Applicable Psych Involvement: No (comment)  Admission diagnosis:  Pleural effusion [J90]  Pleural effusion, bilateral [J90] Dyspnea, unspecified type [R06.00] Patient Active Problem List   Diagnosis Date Noted   Pleural effusion 08/30/2022   Pleural effusion, bilateral 08/19/2022   Chronic kidney disease, stage 3a (HCC) 08/19/2022   Normocytic anemia 08/19/2022   Depression with anxiety 08/19/2022   Acute  metabolic encephalopathy 09/23/2021   Mixed hyperlipidemia due to type 2 diabetes mellitus (HCC) 09/23/2021   Major depressive disorder 09/23/2021   Chronic kidney disease, stage 3b (HCC) 03/17/2021   Anemia of chronic disease 03/16/2021   Type 2 diabetes mellitus with stage 3b chronic kidney disease, without long-term current use of insulin (HCC) 03/16/2021   S/P TAVR (transcatheter aortic valve replacement) 03/16/2021   TR (tricuspid regurgitation) 09/19/2016   Aortic insufficiency 09/15/2015   SVT (supraventricular tachycardia) 09/15/2015   Severe aortic stenosis 09/15/2015   Paroxysmal atrial tachycardia 11/27/2013   PCP:  Nicole Hazel, MD Pharmacy:   [facility] [phone] - [place], [place] - [address] [facility] [address] [place] [address] Phone: [phone] Fax: [phone]  [facility] - [place], [place] - [address] [place] [address] Phone: [phone] Fax: [phone]     Social Determinants of Health (SDOH) Social History: SDOH Screenings   Food Insecurity: No Food Insecurity (08/30/2022)  Housing: Low Risk  (08/30/2022)  Transportation Needs: No Transportation Needs (08/30/2022)  Utilities: Not At Risk (08/30/2022)  Tobacco Use: Low Risk  (08/30/2022)   SDOH Interventions:     Readmission Risk Interventions    08/31/2022   12:13 PM  Readmission Risk Prevention Plan  Transportation Screening Complete  HRI or Home Care Consult Complete  Palliative Care Screening Not Applicable  Medication Review (RN Care Manager) Complete

## 2022-08-31 NOTE — Consult Note (Signed)
NAME:  Nicole Graves, MRN:  [id number], DOB:  08/29/1945, LOS: 1 ADMISSION DATE:  08/29/2022, CONSULTATION DATE:  08/30/22 REFERRING MD:  Hall-TRH, CHIEF COMPLAINT:  bilateral pleural effusions   History of Present Illness:  Nicole Graves is a 77 year old woman with a history of aortic stenosis status post TAVR, tricuspid regurgitation, SVT, status post ablation, CKD 3B, hypertension who presented with shortness of breath that has worsened over the last week.  She was in the hospital last week and received diuresis but was not sent home on diuretics due to orthostatic hypotension.  She had right-sided thoracentesis with pseudo exudative effusion.  In the emergency department she had a chest x-ray demonstrating bilateral pleural effusions and pulmonary critical care was consulted for evaluation.  She denies a history of malignancy or tobacco use.  She has never been on outpatient diuretics before.  She notes that she had a fall a few weeks ago but this was after turning her head only.  She has learned to sit up and stand up more slowly to prevent significant symptoms  Pertinent  Medical History  Aortic stenosis status post TAVR in 2023 Orthostatic hypotension Hypertension Hyperlipidemia Tricuspid regurgitation   Significant Hospital Events: Including procedures, antibiotic start and stop dates in addition to other pertinent events   Admitted 7/15 7/16 diuresis  7/17 CXR shows smaller effusions  Interim History / Subjective:  LessSOB, still has some with walking in the hall. Has had hoarseness for several weeks and throat clearing. Has had heartburn since stopping PPI and requires doses of pepcid several times per week. No significant allergic symptoms.   Objective   Blood pressure 115/68, pulse 80, temperature (!) 97.3 F (36.3 C), temperature source Oral, resp. rate 17, height 5\' 4"  (1.626 m), weight 79.7 kg, SpO2 96%.        Intake/Output Summary (Last 24 hours) at 08/31/2022 1515 Last data  filed at 08/31/2022 0800 Gross per 24 hour  Intake 240 ml  Output --  Net 240 ml   Filed Weights   08/29/22 2149 08/30/22 1403 08/31/22 0349  Weight: 81.6 kg 80.6 kg 79.7 kg    Examination: General: elderly woman sitting up in the chair in NAD HENT: [place]/AT, eyes anicteric Lungs: breathing comfortably on RA, bibasilar rhales, improved aeration Cardiovascular: S1S2, tachycardic, reg rhythm Abdomen:  obese, soft, NT Extremities: no cyanosis, persistent mild LE edema Neuro: awake, alert, normal speech. Answering questions appropriately. Moving all extremities.  Derm: warm, dry, no rashes.    CXR personally reviewed> effusion decreasing in size, especially on the left.    Resolved Hospital Problem list     Assessment & Plan:  Bilateral pleural effusions, simple appearing.  Previously drained right pseudoexudative effusion, possibly consistent with diuresis prior. -Recommend diuresis to euvolemia- switch to oral today. Renal function consistent with where she was in 05/2022.  -Given h/o orthostasis, recommend TED hose and con't midodrine to prevent postural changes being exaggerated by euvolemia. Anticipate this would be a chronic med.  - TED hose - If effusions fail to respond or enlarge despite diuresis to euvolemia, would then consider thoracentesis at that point.  At this point thoracentesis risks outweigh potential benefits since diuresis is working and she is not in respiratory failure.   Hoarseness, heartburn -recommend scheduled famotidine for several weeks to assess for improvement  PCCM will be available as needed.   Best Practice (right click and "Reselect all SmartList Selections" daily)   Per primary  Labs   CBC: Recent Labs  Lab 08/29/22 1749 08/30/22 0607  WBC 9.6 9.0  NEUTROABS 6.5  --   HGB 8.8* 8.7*  HCT 29.8* 29.2*  MCV 95.2 94.5  PLT 668* 597*    Basic Metabolic Panel: Recent Labs  Lab 08/29/22 1749 08/30/22 0607 08/31/22 0721  NA 139 135 135   K 3.9 3.8 3.5  CL 101 101 97*  CO2 24 24 25   GLUCOSE 102* 92 104*  BUN 18 16 28*  CREATININE 0.97 0.97 1.39*  CALCIUM 8.7* 8.5* 8.6*  MG  --  1.9 1.8  PHOS  --  2.8  --    GFR: Estimated Creatinine Clearance: 33.5 mL/min (A) (by C-G formula based on SCr of 1.39 mg/dL (H)). Recent Labs  Lab 08/29/22 1749 08/30/22 0607  WBC 9.6 9.0     Critical care time: n/a    Steffanie Dunn, DO 08/31/22 3:15 PM [facility]  For contact information, see Amion. If no response to pager, please call PCCM consult pager. After hours, 7PM- 7AM, please call El[facility].

## 2022-09-01 DIAGNOSIS — I471 Supraventricular tachycardia, unspecified: Secondary | ICD-10-CM | POA: Diagnosis not present

## 2022-09-01 DIAGNOSIS — J90 Pleural effusion, not elsewhere classified: Secondary | ICD-10-CM | POA: Diagnosis not present

## 2022-09-01 LAB — BASIC METABOLIC PANEL
Anion gap: 12 (ref 5–15)
BUN: 33 mg/dL — ABNORMAL HIGH (ref 8–23)
CO2: 27 mmol/L (ref 22–32)
Calcium: 8.8 mg/dL — ABNORMAL LOW (ref 8.9–10.3)
Chloride: 96 mmol/L — ABNORMAL LOW (ref 98–111)
Creatinine, Ser: 1.52 mg/dL — ABNORMAL HIGH (ref 0.44–1.00)
GFR, Estimated: 35 mL/min — ABNORMAL LOW (ref >60)
Glucose, Bld: 98 mg/dL (ref 70–99)
Potassium: 3.4 mmol/L — ABNORMAL LOW (ref 3.5–5.1)
Sodium: 135 mmol/L (ref 135–145)

## 2022-09-01 LAB — MAGNESIUM: Magnesium: 1.9 mg/dL (ref 1.7–2.4)

## 2022-09-01 MED ORDER — POTASSIUM CHLORIDE CRYS ER 20 MEQ PO TBCR
40.0000 meq | EXTENDED_RELEASE_TABLET | ORAL | Status: AC
  Administered 2022-09-01 (×2): 40 meq via ORAL
  Filled 2022-09-01 (×2): qty 2

## 2022-09-01 MED ORDER — METOPROLOL TARTRATE 12.5 MG HALF TABLET
12.5000 mg | ORAL_TABLET | Freq: Two times a day (BID) | ORAL | Status: DC
  Administered 2022-09-01 – 2022-09-09 (×17): 12.5 mg via ORAL
  Filled 2022-09-01 (×17): qty 1

## 2022-09-01 MED ORDER — FUROSEMIDE 40 MG PO TABS
40.0000 mg | ORAL_TABLET | Freq: Every day | ORAL | Status: DC
  Administered 2022-09-02 – 2022-09-03 (×2): 40 mg via ORAL
  Filled 2022-09-01 (×3): qty 1

## 2022-09-01 NOTE — Progress Notes (Signed)
Physical Therapy Treatment Patient Details Name: Nicole Graves MRN: [id number] DOB: [birth year] Today's Date: 09/01/2022   History of Present Illness Nicole Graves is a 77 y.o. female who presents to the ED sent from her primary care provider due to recurrent bilateral pleural effusions left greater than right seen on chest x-ray done at PCPs office. PMH significant for SVT status post ablation on 06/27/2022, severe AS status post TAVR in 2023, chronic HFpEF, CKD stage IIIa, hyperlipidemia, chronic anxiety/depression, recently admitted from 08/19/2022 through 08/22/2022 for bilateral pleural effusions, status post right thoracentesis.    PT Comments  Pt with elevated HR to 106 bpm at rest, RN gave metoprolol almost an hour prior to PT session. Pt reporting fatigue but agreeable to session. Pt ambulatory in hallway without AD and supervision for safety only, pt with tachycardia up to 140 bpm during gait. Other VSS (13 pt drop in DBP from supine to sit but recovered with continued progression).  Pt reporting shortness of breath post-gait, recovered with rest and SPO2 WFL on RA.  Pt progressing well.     Assistance Recommended at Discharge Intermittent Supervision/Assistance  If plan is discharge home, recommend the following:  Can travel by private vehicle    A little help with walking and/or transfers;A little help with bathing/dressing/bathroom;Assistance with cooking/housework;Assist for transportation;Help with stairs or ramp for entrance      Equipment Recommendations  None recommended by PT    Recommendations for Other Services       Precautions / Restrictions Precautions Precautions: Fall Precaution Comments:  (pt reported 3 falls to OT this AM) Restrictions Weight Bearing Restrictions: No     Mobility  Bed Mobility Overal bed mobility: Needs Assistance Bed Mobility: Supine to Sit, Sit to Supine     Supine to sit: Supervision, HOB elevated Sit to supine: Supervision, HOB  elevated        Transfers Overall transfer level: Needs assistance Equipment used: None Transfers: Sit to/from Stand Sit to Stand: Supervision                Ambulation/Gait Ambulation/Gait assistance: Supervision Gait Distance (Feet): 200 Feet Assistive device: None Gait Pattern/deviations: Step-through pattern, Decreased stride length Gait velocity: decr     General Gait Details: supervision for safety, no physical assist. HR 110-140 bpm, RN aware and premedicated with metoprolol prior to PT seeing   Stairs             Wheelchair Mobility     Tilt Bed    Modified Rankin (Stroke Patients Only)       Balance Overall balance assessment: Needs assistance Sitting-balance support: Feet unsupported, No upper extremity supported Sitting balance-Leahy Scale: Good     Standing balance support: During functional activity Standing balance-Leahy Scale: Fair                              Cognition Arousal/Alertness: Awake/alert Behavior During Therapy: WFL for tasks assessed/performed Overall Cognitive Status: Within Functional Limits for tasks assessed                                          Exercises      General Comments General comments (skin integrity, edema, etc.): SpO2 98% post-gait, orthostatics documented, not dizzy throughout      Pertinent Vitals/Pain Pain Assessment Pain Assessment: No/denies pain Pain Intervention(s):  Monitored during session    Home Living                          Prior Function            PT Goals (current goals can now be found in the care plan section) Acute Rehab PT Goals Patient Stated Goal: return to independence PT Goal Formulation: With patient Time For Goal Achievement: 09/13/22 Potential to Achieve Goals: Good Progress towards PT goals: Progressing toward goals    Frequency    Min 1X/week      PT Plan Current plan remains appropriate     Co-evaluation              AM-PAC PT "6 Clicks" Mobility   Outcome Measure  Help needed turning from your back to your side while in a flat bed without using bedrails?: A Little Help needed moving from lying on your back to sitting on the side of a flat bed without using bedrails?: A Little Help needed moving to and from a bed to a chair (including a wheelchair)?: A Little Help needed standing up from a chair using your arms (e.g., wheelchair or bedside chair)?: A Little Help needed to walk in hospital room?: A Little Help needed climbing 3-5 steps with a railing? : A Lot 6 Click Score: 17    End of Session   Activity Tolerance: Patient tolerated treatment well;Patient limited by fatigue Patient left: in bed;with call bell/phone within reach;with family/visitor present Nurse Communication: Mobility status PT Visit Diagnosis: Other abnormalities of gait and mobility (R26.89);Muscle weakness (generalized) (M62.81);Repeated falls (R29.6);Difficulty in walking, not elsewhere classified (R26.2)     Time: 6578-4696 PT Time Calculation (min) (ACUTE ONLY): 15 min  Charges:    $Therapeutic Activity: 8-22 mins PT General Charges $$ ACUTE PT VISIT: 1 Visit                     Marye Round, PT DPT Acute Rehabilitation Services Secure Chat Preferred  Office [phone]    Nicole Graves 09/01/2022, 12:22 PM

## 2022-09-01 NOTE — Progress Notes (Signed)
Occupational Therapy Treatment Patient Details Name: Nicole Graves MRN: [id number] DOB: 1945-05-07 Today's Date: 09/01/2022   History of present illness Nicole Graves is a 77 y.o. female who presents to the ED sent from her primary care provider due to recurrent bilateral pleural effusions left greater than right seen on chest x-ray done at PCPs office. PMH significant for SVT status post ablation on 06/27/2022, severe AS status post TAVR in 2023, chronic HFpEF, CKD stage IIIa, hyperlipidemia, chronic anxiety/depression, recently admitted from 08/19/2022 through 08/22/2022 for bilateral pleural effusions, status post right thoracentesis.   OT comments  Pt making progress with functional goals. No c/o dizziness, HR increasing from 95-104 with in room activity. Pt educated on energy conservatioin strategies with handout provided. OT will continue to follow acutely to maximize level of function and safety   Recommendations for follow up therapy are one component of a multi-disciplinary discharge planning process, led by the attending physician.  Recommendations may be updated based on patient status, additional functional criteria and insurance authorization.    Assistance Recommended at Discharge Intermittent Supervision/Assistance  Patient can return home with the following  Assistance with cooking/housework;Assist for transportation;Help with stairs or ramp for entrance;A little help with bathing/dressing/bathroom   Equipment Recommendations  Tub/shower seat    Recommendations for Other Services      Precautions / Restrictions Precautions Precautions: Fall Precaution Comments:  (pt reported 3 falls to OT this AM) Restrictions Weight Bearing Restrictions: No       Mobility Bed Mobility Overal bed mobility: Needs Assistance       Supine to sit: Supervision, HOB elevated          Transfers Overall transfer level: Needs assistance Equipment used: None Transfers: Sit to/from  Stand Sit to Stand: Supervision           General transfer comment: guarding for safety pt reports no dizziness     Balance Overall balance assessment: Needs assistance Sitting-balance support: Feet unsupported, No upper extremity supported       Standing balance support: During functional activity Standing balance-Leahy Scale: Fair                             ADL either performed or assessed with clinical judgement   ADL Overall ADL's : Needs assistance/impaired             Lower Body Bathing: Min guard;Sit to/from stand Lower Body Bathing Details (indicate cue type and reason): simulated     Lower Body Dressing: Min guard;Sit to/from stand   Toilet Transfer: Supervision/safety;Ambulation;Regular Toilet   Toileting- Architect and Hygiene: Supervision/safety       Functional mobility during ADLs: Supervision/safety General ADL Comments: No c/o dizziness, HR increasing from 95-104 with in room activity. Pt educated on energy conservatioin strategies with handout provided    Extremity/Trunk Assessment Upper Extremity Assessment Upper Extremity Assessment: Generalized weakness   Lower Extremity Assessment Lower Extremity Assessment: Defer to PT evaluation   Cervical / Trunk Assessment Cervical / Trunk Assessment: Normal    Vision Ability to See in Adequate Light: 0 Adequate Patient Visual Report: No change from baseline     Perception     Praxis      Cognition Arousal/Alertness: Awake/alert Behavior During Therapy: WFL for tasks assessed/performed Overall Cognitive Status: Within Functional Limits for tasks assessed  Exercises      Shoulder Instructions       General Comments SpO2 98% post-gait, orthostatics documented, not dizzy throughout    Pertinent Vitals/ Pain       Pain Assessment Pain Assessment: No/denies pain Pain Score: 0-No pain Pain  Intervention(s): Monitored during session  Home Living                                          Prior Functioning/Environment              Frequency  Min 1X/week        Progress Toward Goals  OT Goals(current goals can now be found in the care plan section)  Progress towards OT goals: Progressing toward goals     Plan Discharge plan remains appropriate    Co-evaluation                 AM-PAC OT "6 Clicks" Daily Activity     Outcome Measure   Help from another person eating meals?: None Help from another person taking care of personal grooming?: A Little Help from another person toileting, which includes using toliet, bedpan, or urinal?: A Little Help from another person bathing (including washing, rinsing, drying)?: A Little Help from another person to put on and taking off regular upper body clothing?: A Little Help from another person to put on and taking off regular lower body clothing?: A Little 6 Click Score: 19    End of Session    OT Visit Diagnosis: Unsteadiness on feet (R26.81);Repeated falls (R29.6);Muscle weakness (generalized) (M62.81);Pain   Activity Tolerance Patient limited by fatigue   Patient Left in bed;with call bell/phone within reach (sitting EOB talking with PA in see pt at end of session)   Nurse Communication          Time: 4401-0272 OT Time Calculation (min): 21 min  Charges: OT General Charges $OT Visit: 1 Visit OT Treatments $Self Care/Home Management : 8-22 mins    Galen Manila 09/01/2022, 2:37 PM

## 2022-09-01 NOTE — Progress Notes (Signed)
PROGRESS NOTE    Nicole Graves  ZOX:[id number] DOB: 03/07/1945 DOA: 08/29/2022 PCP: [name], MD  Chief Complaint  Patient presents with   Shortness of Breath    Brief Narrative:   77 y.o. female with medical history significant for SVT status post ablation on 06/27/2022, severe AS status post TAVR in 2023, chronic HFpEF, CKD stage IIIa, hyperlipidemia, chronic anxiety/depression, recently admitted from 08/19/2022 through 08/22/2022 for bilateral pleural effusions, status post right thoracentesis, who presents to the ED sent from her primary care provider due to recurrent bilateral pleural effusions left greater than right seen on chest x-ray done at PCPs office.  The patient endorses exertional dyspnea. The patient was started on IV lasix.  Pulmonary recommended diuresis to euvolemia.  She's been transition to oral diuretics.  Hospitalization complicated by AKI and SVT.  Cardiology has been consulted.  Assessment & Plan:   Principal Problem:   Pleural effusion, bilateral Active Problems:   Pleural effusion  Recurrent bilateral pleural effusions, left greater than right unclear etiology Suspect related to congestive heart failure Last admission, the patient had right thoracentesis Pulmonary consulted and recommend diuresis prior to another thoracentesis (re-consult as needed)-- IV lasix and midodrine-- watch orthostatic vital sign Continue IV diuresing as tolerated (ordered for today and will need to be REORDERED in the AM for tomorrow) -repeating CXR with moderate L effusion, smal R effusion  Incentive spirometer -TED hose, may need abdominal binder -pt remains quite dyspneic, does not feel symptomatically better -7/17--reconsult PCCM -> diuresis to euvolemia, switch to oral lasix   Exertional dyspnea, likely secondary to the above DuoNebs for dyspnea and wheezing Continue to treat underlying conditions IV diuresing Mobilize as tolerated -7/17--reconsult PCCM - recommending  diuresis to euvolemia - if effusions fail to respond or enlarge despite diuresis, consider thora   Acute on chronic HFpEF Recent 2D echo on 08/21/2022 which showed LVEF 60 to 65% Trace lower extremity edema IV diuretics on hold, PO lasix ordered to start 7/19 Start strict I's and O's and daily weight -NEG 5 lbs   History of Orthostasis - follow orthostatics - started on midodrine   SVT - significant tachy noted this AM - SVT on tele - has hx SVT s/p ablation 06/27/2022 - metop was on hold, will resume at lower dose - cardiology consult   Acute Kidney Injury - in the setting of diuresis - holding today, follow tomorrow (oral diuretics ordered to start)   Hoarseness  Heartburn Follow on h2 blocker Reevaluate   Chronic anxiety/depression Resume home regimen.   Iron deficiency anemia Hemoglobin is stable, resume home iron supplement No reported overt bleeding Trend   Peripheral neuropathy Resume home gabapentin   Physical debility PT OT assessment Orthostatic/Fall precautions. -lives with daughter and grandkids in [place]     obesity Body mass index is 30.07 kg/m.    DVT prophylaxis: lovenox Code Status: full Family Communication: none Disposition:   Status is: Inpatient Remains inpatient appropriate because: continued need for inpatient care   Consultants:  Cardiology PCCM  Procedures:  none  Antimicrobials:  Anti-infectives (From admission, onward)    None       Subjective: Feels well No CP or SOB  Objective: Vitals:   09/01/22 0100 09/01/22 0538 09/01/22 0713 09/01/22 0749  BP: 110/63 122/69 111/64   Pulse: (!) 107 (!) 111 (!) 107 (!) 107  Resp: 17 17 17 17   Temp: 98 F (36.7 C) 98.1 F (36.7 C) 98.4 F (36.9 C)   TempSrc: Oral  Oral Oral   SpO2: 94% 93% 93% 96%  Weight:  79.5 kg    Height:        Intake/Output Summary (Last 24 hours) at 09/01/2022 1108 Last data filed at 09/01/2022 0905 Gross per 24 hour  Intake 240 ml   Output --  Net 240 ml   Filed Weights   08/30/22 1403 08/31/22 0349 09/01/22 0538  Weight: 80.6 kg 79.7 kg 79.5 kg    Examination:  General exam: Appears calm and comfortable  Respiratory system: diminished at bases Cardiovascular system: sinus tach, tachycardic Gastrointestinal system: Abdomen is nondistended, soft and nontender.  Central nervous system: Alert and oriented. No focal neurological deficits. Extremities: no LEE   Data Reviewed: I have personally reviewed following labs and imaging studies  CBC: Recent Labs  Lab 08/29/22 1749 08/30/22 0607  WBC 9.6 9.0  NEUTROABS 6.5  --   HGB 8.8* 8.7*  HCT 29.8* 29.2*  MCV 95.2 94.5  PLT 668* 597*    Basic Metabolic Panel: Recent Labs  Lab 08/29/22 1749 08/30/22 0607 08/31/22 0721 09/01/22 0043  NA 139 135 135 135  K 3.9 3.8 3.5 3.4*  CL 101 101 97* 96*  CO2 24 24 25 27   GLUCOSE 102* 92 104* 98  BUN 18 16 28* 33*  CREATININE 0.97 0.97 1.39* 1.52*  CALCIUM 8.7* 8.5* 8.6* 8.8*  MG  --  1.9 1.8 1.9  PHOS  --  2.8  --   --     GFR: Estimated Creatinine Clearance: 30.6 mL/min (Latron Ribas) (by C-G formula based on SCr of 1.52 mg/dL (H)).  Liver Function Tests: Recent Labs  Lab 08/29/22 1749  AST 14*  ALT 11  ALKPHOS 84  BILITOT 0.4  PROT 6.2*  ALBUMIN 2.4*    CBG: No results for input(s): "GLUCAP" in the last 168 hours.   No results found for this or any previous visit (from the past 240 hour(s)).       Radiology Studies: DG CHEST PORT 1 VIEW  Result Date: 08/31/2022 CLINICAL DATA:  Recurrent left pleural effusion. EXAM: PORTABLE CHEST 1 VIEW COMPARISON:  08/29/2022 FINDINGS: Stable cardiomediastinal contours. Status post TAVR. Moderate left pleural effusion is stable to mildly decreased in volume from previous exam. Small right pleural effusion is unchanged. Decreased aeration within the lung bases compatible with atelectasis. Degenerative changes identified within the glenohumeral joints, left  greater than right. IMPRESSION: 1. Moderate left pleural effusion is stable to mildly decreased in volume from previous exam. 2. Small right pleural effusion is unchanged. 3. Bibasilar atelectasis. Electronically Signed   By: Signa Kell M.D.   On: 08/31/2022 15:44        Scheduled Meds:  acetaminophen  1,000 mg Oral Q6H   aspirin EC  81 mg Oral Daily   atorvastatin  10 mg Oral QHS   buPROPion  100 mg Oral Daily   DULoxetine  90 mg Oral Daily   enoxaparin (LOVENOX) injection  40 mg Subcutaneous Q24H   ferrous sulfate  325 mg Oral Q breakfast   [START ON 09/02/2022] furosemide  40 mg Oral Daily   gabapentin  300 mg Oral TID   ipratropium-albuterol  3 mL Nebulization BID   metoprolol tartrate  12.5 mg Oral BID   midodrine  5 mg Oral TID WC   potassium chloride  40 mEq Oral Q4H   Continuous Infusions:   LOS: 2 days    Time spent: over 30 min    Lacretia Nicks, MD [facility]  To contact the attending provider between 7A-7P or the covering provider during after hours 7P-7A, please log into the web site [email or web address] and access using universal [place] password for that web site. If you do not have the password, please call the hospital operator.  09/01/2022, 11:08 AM

## 2022-09-01 NOTE — Progress Notes (Signed)
   09/01/22 1208  Orthostatic Lying   BP- Lying 109/74  Pulse- Lying 106  Orthostatic Sitting  BP- Sitting 107/61  Pulse- Sitting 125  Orthostatic Standing at 0 minutes  BP- Standing at 0 minutes 97/71  Pulse- Standing at 0 minutes 123  Orthostatic Standing at 3 minutes  BP- Standing at 3 minutes 119/74  Pulse- Standing at 3 minutes 140   Safiyya Stokes S, PT DPT Acute Arts development officer Preferred  Office [phone]

## 2022-09-01 NOTE — Plan of Care (Signed)

## 2022-09-01 NOTE — Consult Note (Addendum)
Cardiology Consultation   Patient ID: [name] MRN: [id number]; DOB: 11/04/1945  Admit date: 08/29/2022 Date of Consult: 09/01/2022  PCP:  [name], MD   [facility] Providers Cardiologist:  [name], MD  Electrophysiologist:  Lewayne Bunting, MD  { Structural: Dr. Roby Lofts   Patient Profile:   Nicole Graves is a 77 y.o. 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 Lowell Guitar.  History of Present Illness:   Nicole Graves was admitted 08/19/22 with progressive SOB after a mechanical fall,  chest CT noted no PE. Large bilateral pleural effusion with compressive atelectasis. Large hiatal hernia > R thoraf;uid not felt c/w hemothorax, suspected HF, treated with diuresis, echo with preserved LVE, grade I DD, stable TAVR function. Discharged 08/22/22   Readmitted now 08/29/22, via her PMD office with SOB and CXR with b/l effusions again, ER gave IV lasix, admitted for pulmonary evaluations, diuresis. Pulmonary consult recommended ongoing diuresis, to plan thora only if not responsive to diuresis, >> oral planned for tomorrow, held today for AKI Also noted with FERD, hoarseness associated with it and advised famotidine   CXR yesterday with moderate L pleural effusion stable, to mildly decreased, small unchanged R pleural effusion  EP is asked on board with observed SVT on telemetry  LABS  K+ 3.4 BUN/Creat 33/1.52 (baseline about 1.0)  BNP 163.5 HS Trop 19, 20 WBC 9.0 H/H 8.7/29.2  (baseline from April 2024 12 > July 9.2 > 7.7 > 8.1 > 8.2 ), chart reports chronic anemia, normocytic anemia Plts 597  Home dose BB is Toprol 50mg  daily  Here has been held, started on midodrine with mention of orthostatic  >> lopressor 12.5mg  BID started this AM  Historically she has been very symptomatic and aware of her SVT, since her ablation has had no symptoms,  palpitations or awareness of her heart beat, does not think she has had SVT. This AM she was alarmed when the monitor rang to 180 while she was eating. She denies any CP Remains pretty SOB, winded with talking sometimes still. 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 Lynnette Caffey and Dr. Laneta Simmers   Severe aortic stenosis    Stage 3b chronic kidney disease (CKD) (HCC)    SVT (supraventricular tachycardia)    TR (tricuspid regurgitation) 09/19/2016   mild by echo 09/2021    Past Surgical History:  Procedure Laterality Date   CESAREAN SECTION  1964   X2   CHOLECYSTECTOMY     COSMETIC SURGERY  1980   removal extra fat inner thighs  EYE SURGERY Bilateral     Cataract extraction with IOL   INTRAOPERATIVE TRANSTHORACIC ECHOCARDIOGRAM N/A 03/16/2021   Procedure: INTRAOPERATIVE TRANSTHORACIC ECHOCARDIOGRAM;  Surgeon: Orbie Pyo, MD;  Location: [facility] OR;  Service: Open Heart Surgery;  Laterality: N/A;   RIGHT HEART CATH AND CORONARY ANGIOGRAPHY N/A 02/17/2021   Procedure: RIGHT HEART CATH AND CORONARY ANGIOGRAPHY;  Surgeon: Orbie Pyo, MD;  Location: [facility] INVASIVE CV LAB;  Service: Cardiovascular;  Laterality: N/A;   SVT ABLATION N/A 06/27/2022   Procedure: SVT ABLATION;  Surgeon: Marinus Maw, MD;  Location: [facility] INVASIVE CV LAB;  Service: Cardiovascular;   Laterality: N/A;   TOTAL KNEE ARTHROPLASTY Right 11/30/2015   Procedure: RIGHT TOTAL KNEE ARTHROPLASTY;  Surgeon: Ollen Gross, MD;  Location: [facility] ORS;  Service: Orthopedics;  Laterality: Right;   TOTAL KNEE ARTHROPLASTY Left 11/14/2016   Procedure: LEFT TOTAL KNEE ARTHROPLASTY;  Surgeon: Ollen Gross, MD;  Location: [facility] ORS;  Service: Orthopedics;  Laterality: Left;   TRANSCATHETER AORTIC VALVE REPLACEMENT, TRANSFEMORAL Right 03/16/2021   Procedure: TRANSCATHETER AORTIC VALVE REPLACEMENT, TRANSFEMORAL;  Surgeon: Orbie Pyo, MD;  Location: [facility] OR;  Service: Open Heart Surgery;  Laterality: Right;     Home Medications:  Prior to Admission medications   Medication Sig Start Date End Date Taking? Authorizing Provider  acetaminophen (TYLENOL) 650 MG CR tablet Take 1,300 mg by mouth in the morning and at bedtime.   Yes [provider]  alendronate (FOSAMAX) 70 MG tablet Take 70 mg by mouth every Thursday.   Yes [provider]  aspirin EC 81 MG tablet Take 81 mg by mouth daily. Swallow whole.   Yes [provider]  atorvastatin (LIPITOR) 10 MG tablet Take 10 mg by mouth at bedtime.  02/11/15  Yes [provider]  buPROPion (WELLBUTRIN) 100 MG tablet Take 100 mg by mouth daily.   Yes [provider]  cyclobenzaprine (FLEXERIL) 10 MG tablet Take 1 tablet (10 mg total) by mouth at bedtime. 04/14/22  Yes Medina-Vargas, Monina C, NP  DULoxetine (CYMBALTA) 30 MG capsule Take 90 mg by mouth daily after breakfast.   Yes [provider]  famotidine (PEPCID) 20 MG tablet Take 20 mg by mouth daily as needed for heartburn.   Yes [provider]  ferrous sulfate 325 (65 FE) MG tablet Take 325 mg by mouth daily.   Yes [provider]  gabapentin (NEURONTIN) 300 MG capsule Take 300 mg by mouth 3 (three) times daily. 12/20/19  Yes [provider]  Melatonin 5 MG CAPS Take 1 capsule by mouth at bedtime.   Yes [provider]   metoprolol succinate (TOPROL-XL) 50 MG 24 hr tablet Take 1 tablet (50 mg total) by mouth daily. Take with or immediately following a meal. You may take an extra 50 mg dose if you experience palpitations. 04/20/22  Yes Meriam Sprague, MD  lidocaine (LIDODERM) 5 % Place 1 patch onto the skin daily. Remove & Discard patch within 12 hours or as directed by MD. Apply to site of pain Patient not taking: Reported on 08/29/2022 09/27/21   Joycelyn Das, MD    Inpatient Medications: Scheduled Meds:  acetaminophen  1,000 mg Oral Q6H   aspirin EC  81 mg Oral Daily   atorvastatin  10 mg Oral QHS   buPROPion  100 mg Oral Daily   DULoxetine  90 mg Oral Daily   enoxaparin (LOVENOX) injection  40 mg Subcutaneous Q24H   ferrous sulfate  325 mg Oral  Q breakfast   [START ON 09/02/2022] furosemide  40 mg Oral Daily   gabapentin  300 mg Oral TID   metoprolol tartrate  12.5 mg Oral BID   midodrine  5 mg Oral TID WC   potassium chloride  40 mEq Oral Q4H   Continuous Infusions:  PRN Meds: albuterol, cyclobenzaprine, famotidine, mouth rinse  Allergies:    Allergies  Allergen Reactions   Sulfa Antibiotics Nausea And Vomiting and Other (See Comments)   Ativan [Lorazepam] Other (See Comments)    Hyperactive    Tizanidine Hcl Other (See Comments)    Pt does not remember     Social History:   Social History   Socioeconomic History   Marital status: Divorced    Spouse name: Not on file   Number of children: Not on file   Years of education: Not on file   Highest education level: Not on file  Occupational History   Not on file  Tobacco Use   Smoking status: Never   Smokeless tobacco: Never  Vaping Use   Vaping status: Never Used  Substance and Sexual Activity   Alcohol use: No   Drug use: No   Sexual activity: Not on file  Other Topics Concern   Not on file  Social History Narrative   Not on file   Social Determinants of Health   Financial Resource Strain: Not on file  Food  Insecurity: No Food Insecurity (08/30/2022)   Hunger Vital Sign    Worried About Running Out of Food in the Last Year: Never true    Ran Out of Food in the Last Year: Never true  Transportation Needs: No Transportation Needs (08/30/2022)   PRAPARE - Administrator, Civil Service (Medical): No    Lack of Transportation (Non-Medical): No  Physical Activity: Not on file  Stress: Not on file  Social Connections: Not on file  Intimate Partner Violence: Not At Risk (08/30/2022)   Humiliation, Afraid, Rape, and Kick questionnaire    Fear of Current or Ex-Partner: No    Emotionally Abused: No    Physically Abused: No    Sexually Abused: No    Family History:   Family History  Problem Relation Age of Onset   Colon cancer Mother    Heart attack Brother    Hypertension Brother    Stroke Neg Hx      ROS:  Please see the history of present illness.  All other ROS reviewed and negative.     Physical Exam/Data:   Vitals:   09/01/22 0713 09/01/22 0749 09/01/22 1200 09/01/22 1208  BP: 111/64  109/74 119/74  Pulse: (!) 107 (!) 107  (!) 115  Resp: 17 17  20   Temp: 98.4 F (36.9 C)   97.8 F (36.6 C)  TempSrc: Oral   Oral  SpO2: 93% 96%  97%  Weight:      Height:        Intake/Output Summary (Last 24 hours) at 09/01/2022 1333 Last data filed at 09/01/2022 1318 Gross per 24 hour  Intake 120 ml  Output --  Net 120 ml      09/01/2022    5:38 AM 08/31/2022    3:49 AM 08/30/2022    2:03 PM  Last 3 Weights  Weight (lbs) 175 lb 3.2 oz 175 lb 11.3 oz 177 lb 11.1 oz  Weight (kg) 79.47 kg 79.7 kg 80.6 kg     Body mass index is 30.07 kg/m.  General:  Well nourished, well developed, in no acute distress HEENT: normal Neck: no JVD Vascular: No carotid bruits; Distal pulses 2+ bilaterally Cardiac:  RRR; no murmurs, gallops or rubs Lungs:  decreased BS Lside to just above mid-lung, R is clear with only sof crackles at the base no wheezing, rhonchi or rales  Abd: soft,  nontender, no hepatomegaly  Ext: no edema Musculoskeletal:  No deformities Skin: warm and dry  Neuro:  no focal abnormalities noted Psych:  Normal affect   EKG:  The EKG was personally reviewed and demonstrates:    Baseline artifact, SR 89bpm, PAC ST 115bpm,   OLD 06/11/22: SVT 145bpm,  06/11/22 SR 93bpm  Telemetry:  Telemetry was personally reviewed and demonstrates:   SR/ST sinus rates 80's-130's or so Brief PATs   Relevant CV Studies:  08/21/22: TTE  1. Left ventricular ejection fraction, by estimation, is 60 to 65%. The  left ventricle has normal function. The left ventricle has no regional  wall motion abnormalities. Left ventricular diastolic parameters are  consistent with Grade I diastolic  dysfunction (impaired relaxation).   2. Right ventricular systolic function is normal. The right ventricular  size is normal. There is mildly elevated pulmonary artery systolic  pressure. The estimated right ventricular systolic pressure is 42.7 mmHg.   3. The mitral valve is degenerative. Trivial mitral valve regurgitation.   4. The aortic valve has been repaired/replaced. Aortic valve  regurgitation is not visualized. There is a 29 mm CoreValve-Evolut Pro  prosthetic (TAVR) valve present in the aortic position. Procedure Date:  03/16/21.   5. The inferior vena cava is normal in size with greater than 50%  respiratory variability, suggesting right atrial pressure of 3 mmHg.   Comparison(s): Prior images reviewed side by side. LVEF remains normal at  60-65%. TAVR stable with peak gradient 13 mmHg and no paravalvular  regurgitation.   06/27/22: EPS/ablation CONCLUSIONS:  1. Sinus rhythm upon presentation.  2. 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 Successful radiofrequency modification of the slow AV nodal pathway  4. No inducible arrhythmias following  ablation.  5. No early apparent complications.   Echocardiogram 03/18/22:  1. S/P TAVR with mean gradient 6 mmHg and DI 0.65; mild AI; no  significant change compared to 04/16/21.   2. Left ventricular ejection fraction, by estimation, is 60 to 65%. The  left ventricle has normal function. The left ventricle has no regional  wall motion abnormalities. There is mild left ventricular hypertrophy.  Left ventricular diastolic parameters  are consistent with Grade I diastolic dysfunction (impaired relaxation).  Elevated left atrial pressure.   3. Right ventricular systolic function is normal. The right ventricular  size is normal.   4. The mitral valve is normal in structure. Mild mitral valve  regurgitation. No evidence of mitral stenosis.   5. Tricuspid valve regurgitation is mild to moderate.   6. The aortic valve has been repaired/replaced. Aortic valve  regurgitation is mild. No aortic stenosis is present. There is a 29 mm  CoreValve-Evolut Pro prosthetic (TAVR) valve present in the aortic  position. Procedure Date: 03/16/21.   7. The inferior vena cava is normal in size with greater than 50%  respiratory variability, suggesting right atrial pressure of 3 mmHg.   Laboratory Data:  High Sensitivity Troponin:   Recent Labs  Lab 08/19/22 1647 08/19/22 1838 08/29/22 1749 08/29/22 2048  TROPONINIHS 11 9 19*  20*     Chemistry Recent Labs  Lab 08/30/22 0607 08/31/22 0721 09/01/22 0043  NA 135 135 135  K 3.8 3.5 3.4*  CL 101 97* 96*  CO2 24 25 27   GLUCOSE 92 104* 98  BUN 16 28* 33*  CREATININE 0.97 1.39* 1.52*  CALCIUM 8.5* 8.6* 8.8*  MG 1.9 1.8 1.9  GFRNONAA 59* 39* 35*  ANIONGAP 10 13 12     Recent Labs  Lab 08/29/22 1749  PROT 6.2*  ALBUMIN 2.4*  AST 14*  ALT 11  ALKPHOS 84  BILITOT 0.4   Lipids No results for input(s): "CHOL", "TRIG", "HDL", "LABVLDL", "LDLCALC", "CHOLHDL" in the last 168 hours.  Hematology Recent Labs  Lab 08/29/22 1749 08/30/22 0607  WBC  9.6 9.0  RBC 3.13* 3.09*  HGB 8.8* 8.7*  HCT 29.8* 29.2*  MCV 95.2 94.5  MCH 28.1 28.2  MCHC 29.5* 29.8*  RDW 14.5 14.6  PLT 668* 597*   Thyroid No results for input(s): "TSH", "FREET4" in the last 168 hours.  BNP Recent Labs  Lab 08/29/22 1749  BNP 163.5*    DDimer No results for input(s): "DDIMER" in the last 168 hours.   Radiology/Studies:  DG CHEST PORT 1 VIEW  Result Date: 08/31/2022 CLINICAL DATA:  Recurrent left pleural effusion. EXAM: PORTABLE CHEST 1 VIEW COMPARISON:  08/29/2022 FINDINGS: Stable cardiomediastinal contours. Status post TAVR. Moderate left pleural effusion is stable to mildly decreased in volume from previous exam. Small right pleural effusion is unchanged. Decreased aeration within the lung bases compatible with atelectasis. Degenerative changes identified within the glenohumeral joints, left greater than right. IMPRESSION: 1. Moderate left pleural effusion is stable to mildly decreased in volume from previous exam. 2. Small right pleural effusion is unchanged. 3. Bibasilar atelectasis. Electronically Signed   By: Signa Kell M.D.   On: 08/31/2022 15:44   DG Chest 2 View  Result Date: 08/29/2022 CLINICAL DATA:  Shortness of breath. EXAM: CHEST - 2 VIEW COMPARISON:  Same day. FINDINGS: Stable cardiomegaly. Status post transcatheter aortic valve repair. Bilateral pleural effusions are noted with associated bibasilar atelectasis, left greater than right. Degenerative changes seen involving the left glenohumeral joint. IMPRESSION: Bilateral pleural effusions are noted with associated bibasilar atelectasis, left greater than right. Electronically Signed   By: Lupita Raider M.D.   On: 08/29/2022 18:16     Assessment and Plan:   SVT S/p AVNRT ablation (slow pathway) in May In review of her telemtry Bahavior of her tachycardia more c/w sinus tachycardia' I don't see any clear evidence of sustained SVT, she has had some PATs  BP looks OK Consider  weaning/reducing midodrine > agree with return of her BB and up-titrate as able   2. Pleural effusion(s) L lung remains with volume by exam Otherwise W/u and management into large pleural effusions with IM and pulm service TAVR working well Mild Grade I DD LVEF preserved  EP MD Rhone Ozaki see once out pf procedure  Risk Assessment/Risk Scores:    For questions or updates, please contact [facility] Please consult [email or web address] for contact info under    Signed, Sheilah Pigeon, PA-C  09/01/2022 1:33 PM  I have seen and examined this patient with Francis Dowse.  Agree with above, note added to reflect my findings.  Patient has a past history as above.  She presented to the hospital after mechanical fall with progressive shortness of breath.  CT of the chest showed no PE but she was found to have bilateral  pleural effusions with compressive atelectasis.  She had an echo that showed normal ejection fraction and grade 1 diastolic dysfunction and a normally functioning TAVR valve.  She has been undergoing diuresis.  She has had a few episodes of rapid heart rates on telemetry.  The patient is unaware of these episodes.  She states that the only reason she is aware is that she can see her heart rate on telemetry.  GEN: Well nourished, well developed, in no acute distress  HEENT: normal  Neck: no JVD, carotid bruits, or masses Cardiac: RRR; no murmurs, rubs, or gallops,no edema  Respiratory: Decreased respiratory sounds at the bases, normal work of breathing GI: soft, nontender, nondistended, + BS MS: no deformity or atrophy  Skin: warm and dry,  Neuro:  Strength and sensation are intact Psych: euthymic mood, full affect   SVT: Patient has a history of AVNRT post ablation.  She has been having episodes of either atrial or sinus tachycardia.  The patient is unaware of these episodes.  These are different than the prior arrhythmia which was ablated.  At this point, as she is minimally  symptomatic, Ladarren Steiner continue therapy for her pleural effusions with diuresis.  Would titrate beta-blocker to affect as blood pressure allows.  No further EP workup is necessary at this time.  Thailyn Khalid M. Breshae Belcher MD 09/01/2022 8:47 PM

## 2022-09-02 DIAGNOSIS — J90 Pleural effusion, not elsewhere classified: Secondary | ICD-10-CM | POA: Diagnosis not present

## 2022-09-02 LAB — COMPREHENSIVE METABOLIC PANEL
ALT: 10 U/L (ref 0–44)
AST: 13 U/L — ABNORMAL LOW (ref 15–41)
Albumin: 2.3 g/dL — ABNORMAL LOW (ref 3.5–5.0)
Alkaline Phosphatase: 64 U/L (ref 38–126)
Anion gap: 8 (ref 5–15)
BUN: 28 mg/dL — ABNORMAL HIGH (ref 8–23)
CO2: 26 mmol/L (ref 22–32)
Calcium: 8.7 mg/dL — ABNORMAL LOW (ref 8.9–10.3)
Chloride: 102 mmol/L (ref 98–111)
Creatinine, Ser: 1.21 mg/dL — ABNORMAL HIGH (ref 0.44–1.00)
GFR, Estimated: 46 mL/min — ABNORMAL LOW (ref >60)
Glucose, Bld: 98 mg/dL (ref 70–99)
Potassium: 4.6 mmol/L (ref 3.5–5.1)
Sodium: 136 mmol/L (ref 135–145)
Total Bilirubin: 0.1 mg/dL — ABNORMAL LOW (ref 0.3–1.2)
Total Protein: 5.7 g/dL — ABNORMAL LOW (ref 6.5–8.1)

## 2022-09-02 LAB — CBC WITH DIFFERENTIAL/PLATELET
Abs Immature Granulocytes: 0.06 10*3/uL (ref 0.00–0.07)
Basophils Absolute: 0.1 10*3/uL (ref 0.0–0.1)
Basophils Relative: 1 %
Eosinophils Absolute: 0.4 10*3/uL (ref 0.0–0.5)
Eosinophils Relative: 3 %
HCT: 26.3 % — ABNORMAL LOW (ref 36.0–46.0)
Hemoglobin: 8.1 g/dL — ABNORMAL LOW (ref 12.0–15.0)
Immature Granulocytes: 1 %
Lymphocytes Relative: 22 %
Lymphs Abs: 2.5 10*3/uL (ref 0.7–4.0)
MCH: 28.7 pg (ref 26.0–34.0)
MCHC: 30.8 g/dL (ref 30.0–36.0)
MCV: 93.3 fL (ref 80.0–100.0)
Monocytes Absolute: 1 10*3/uL (ref 0.1–1.0)
Monocytes Relative: 8 %
Neutro Abs: 7.4 10*3/uL (ref 1.7–7.7)
Neutrophils Relative %: 65 %
Platelets: 624 10*3/uL — ABNORMAL HIGH (ref 150–400)
RBC: 2.82 MIL/uL — ABNORMAL LOW (ref 3.87–5.11)
RDW: 14.8 % (ref 11.5–15.5)
WBC: 11.4 10*3/uL — ABNORMAL HIGH (ref 4.0–10.5)
nRBC: 0 % (ref 0.0–0.2)

## 2022-09-02 LAB — PHOSPHORUS: Phosphorus: 2.7 mg/dL (ref 2.5–4.6)

## 2022-09-02 LAB — MAGNESIUM: Magnesium: 2 mg/dL (ref 1.7–2.4)

## 2022-09-02 NOTE — Plan of Care (Signed)

## 2022-09-02 NOTE — Care Management Important Message (Signed)
Important Message  Patient Details  Name: Nicole Graves MRN: [id number] Date of Birth: [birth year]   Medicare Important Message Given:  Yes     Renie Ora 09/02/2022, 10:31 AM

## 2022-09-02 NOTE — Progress Notes (Signed)
PROGRESS NOTE    Nicole Graves  ZDG:[id number] DOB: [birth year] DOA: 08/29/2022 PCP: [name], MD   Brief Narrative: This 77 y.o. 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  The patient endorses exertional dyspnea. The patient was started on IV lasix.  Pulmonary recommended diuresis to euvolemia.  She's been transitioned to oral diuretics.  Hospitalization complicated by AKI and SVT.  Cardiology has been consulted.   Assessment & Plan:   Principal Problem:   Pleural effusion, bilateral Active Problems:   Pleural effusion   Recurrent bilateral pleural effusions,Unclear Etiology: Suspect related to congestive heart failure. Last admission, the patient had right thoracentesis Pulmonary consulted and recommend diuresis prior to another thoracentesis (re-consult as needed). Continue IV lasix and midodrine-- watch for orthostatic vital signs Continue IV diuresis as tolerated (ordered for today and will need to be REORDERED in the AM for tomorrow) -repeating CXR with moderate L effusion, smal R effusion. Continue Incentive spirometer -TED hose, may need abdominal binder -7/17--reconsult PCCM -> diuresis to euvolemia, switch to oral lasix   Exertional dyspnea, likely secondary to the above: Continue DuoNebs for dyspnea and wheezing. Continue to treat underlying conditions. Continue IV diuresis. Mobilize as tolerated -7/17--reconsult PCCM - recommending diuresis to euvolemia - if effusions fail to respond or enlarge despite diuresis, consider  thoracocentesis.   Acute on chronic HFpEF: Recent 2D echo on 08/21/2022 which showed LVEF 60 to 65% Trace lower extremity edema IV diuretics on hold, PO lasix ordered to start 7/19 Start strict I's and O's and daily weight -NEG 5 lbs   History of Orthostasis Follow up orthostatics. Continue midodrine.   SVT: - SVT on tele - has hx SVT s/p ablation 06/27/2022 - metoprolol resumed at lower dose,  - cardiology consulted, no EP workup needed.   Acute Kidney Injury: - in the setting of diuresis , Lasix held, Creatinine improving.   Hoarseness / Heartburn: Continue H2 blocker.   Chronic anxiety/depression Resume home regimen.   Iron deficiency anemia Hemoglobin is stable, resume home iron supplement No reported overt bleeding Trend H/H   Peripheral neuropathy Resume home gabapentin.   Physical debility PT OT assessment Orthostatic/Fall precautions. -lives with daughter and grandkids in [place]    Obesity Body mass index is 30.07 kg/m.     DVT prophylaxis: Lovenox Code Status:Full code Family Communication:No family at bed side. Disposition Plan:   Status is: Inpatient Remains inpatient appropriate because: continued need for inpatient care    Consultants:  Cardiology PCCM  Procedures:  Antimicrobials:  Anti-infectives (From admission, onward)    None      Subjective: Patient was seen and examined at bed side.  She is doing fine, denies any chest pain,Shortness of breath, sitting comfortably on chair.  Objective: Vitals:   09/01/22 2040 09/02/22 0110 09/02/22 0500 09/02/22 0953  BP: 108/67 104/69  112/79  Pulse: 98 80  (!) 102  Resp:  18 18 16   Temp: 98 F (36.7 C) (!) 97.5 F (36.4 C) 98 F (36.7 C) 97.9 F (36.6 C)  TempSrc: Oral Oral Oral Oral  SpO2: 95% 96% 95% 98%  Weight:  79.3 kg   Height:        Intake/Output Summary (Last 24 hours) at 09/02/2022 1050 Last data filed at 09/02/2022 1000 Gross per 24 hour  Intake 580 ml  Output  --  Net 580 ml   Filed Weights   08/31/22 0349 09/01/22 0538 09/02/22 0500  Weight: 79.7 kg 79.5 kg 79.3 kg    Examination:  General exam: Appears calm and comfortable, NAD, sitting comfortably Respiratory system: Decreased breath sound on LLL, CTA on Right. Respiratory effort normal. RR16 Cardiovascular system: S1 & S2 heard, RRR. No JVD, murmurs, rubs, gallops or clicks. No pedal edema. Gastrointestinal system: Abdomen is nondistended, soft and nontender. Normal bowel sounds heard. Central nervous system: Alert and oriented. No focal neurological deficits. Extremities: Symmetric 5 x 5 power. Skin: No rashes, lesions or ulcers Psychiatry: Judgement and insight appear normal. Mood & affect appropriate.     Data Reviewed: I have personally reviewed following labs and imaging studies  CBC: Recent Labs  Lab 08/29/22 1749 08/30/22 0607 09/01/22 2345  WBC 9.6 9.0 11.4*  NEUTROABS 6.5  --  7.4  HGB 8.8* 8.7* 8.1*  HCT 29.8* 29.2* 26.3*  MCV 95.2 94.5 93.3  PLT 668* 597* 624*   Basic Metabolic Panel: Recent Labs  Lab 08/29/22 1749 08/30/22 0607 08/31/22 0721 09/01/22 0043 09/01/22 2345  NA 139 135 135 135 136  K 3.9 3.8 3.5 3.4* 4.6  CL 101 101 97* 96* 102  CO2 24 24 25 27 26   GLUCOSE 102* 92 104* 98 98  BUN 18 16 28* 33* 28*  CREATININE 0.97 0.97 1.39* 1.52* 1.21*  CALCIUM 8.7* 8.5* 8.6* 8.8* 8.7*  MG  --  1.9 1.8 1.9 2.0  PHOS  --  2.8  --   --  2.7   GFR: Estimated Creatinine Clearance: 38.4 mL/min (A) (by C-G formula based on SCr of 1.21 mg/dL (H)). Liver Function Tests: Recent Labs  Lab 08/29/22 1749 09/01/22 2345  AST 14* 13*  ALT 11 10  ALKPHOS 84 64  BILITOT 0.4 0.1*  PROT 6.2* 5.7*  ALBUMIN 2.4* 2.3*   No results for input(s): "LIPASE", "AMYLASE" in the last 168 hours. No results for input(s): "AMMONIA" in the last 168 hours. Coagulation Profile: No results for input(s): "INR", "PROTIME" in the last 168 hours. Cardiac Enzymes: No results for  input(s): "CKTOTAL", "CKMB", "CKMBINDEX", "TROPONINI" in the last 168 hours. BNP (last 3 results) No results for input(s): "PROBNP" in the last 8760 hours. HbA1C: No results for input(s): "HGBA1C" in the last 72 hours. CBG: No results for input(s): "GLUCAP" in the last 168 hours. Lipid Profile: No results for input(s): "CHOL", "HDL", "LDLCALC", "TRIG", "CHOLHDL", "LDLDIRECT" in the last 72 hours. Thyroid Function Tests: No results for input(s): "TSH", "T4TOTAL", "FREET4", "T3FREE", "THYROIDAB" in the last 72 hours. Anemia Panel: No results for input(s): "VITAMINB12", "FOLATE", "FERRITIN", "TIBC", "IRON", "RETICCTPCT" in the last 72 hours. Sepsis Labs: No results for input(s): "PROCALCITON", "LATICACIDVEN" in the last 168 hours.  No results found for this or any previous visit (from the past 240 hour(s)).   Radiology Studies: DG CHEST PORT 1 VIEW  Result Date: 08/31/2022 CLINICAL DATA:  Recurrent left pleural effusion. EXAM: PORTABLE CHEST 1 VIEW COMPARISON:  08/29/2022 FINDINGS: Stable cardiomediastinal contours. Status post TAVR. Moderate left pleural effusion is stable to mildly decreased in volume from previous exam. Small right pleural effusion is unchanged. Decreased aeration within the lung bases compatible with atelectasis. Degenerative changes identified within the glenohumeral joints, left  greater than right. IMPRESSION: 1. Moderate left pleural effusion is stable to mildly decreased in volume from previous exam. 2. Small right pleural effusion is unchanged. 3. Bibasilar atelectasis. Electronically Signed   By: Signa Kell M.D.   On: 08/31/2022 15:44    Scheduled Meds:  acetaminophen  1,000 mg Oral Q6H   aspirin EC  81 mg Oral Daily   atorvastatin  10 mg Oral QHS   buPROPion  100 mg Oral Daily   DULoxetine  90 mg Oral Daily   enoxaparin (LOVENOX) injection  40 mg Subcutaneous Q24H   ferrous sulfate  325 mg Oral Q breakfast   furosemide  40 mg Oral Daily   gabapentin  300  mg Oral TID   metoprolol tartrate  12.5 mg Oral BID   midodrine  5 mg Oral TID WC   Continuous Infusions:   LOS: 3 days    Time spent: 50 mins    Willeen Niece, MD [facility]   If 7PM-7AM, please contact night-coverage

## 2022-09-02 NOTE — Progress Notes (Signed)
   09/02/22 1047  Mobility  Activity Ambulated with assistance in hallway  Level of Assistance Contact guard assist, steadying assist  Assistive Device None  Distance Ambulated (ft) 200 ft  Activity Response Tolerated well  Mobility Referral Yes  $Mobility charge 1 Mobility  Mobility Specialist Start Time (ACUTE ONLY) 1030  Mobility Specialist Stop Time (ACUTE ONLY) 1040  Mobility Specialist Time Calculation (min) (ACUTE ONLY) 10 min   Mobility Specialist: Progress Note   Pre-Mobility: HR 100bpm,  BP 112/79 Post-Mobility: HR 104 -120bpm, BP 139/78   Pt agreeable to mobility session. Supervision to MinG during ambulation, requiring some assistance w/ steadiness. Pt c/o tiredness and heaviness in legs. Post Mobility c/o chest pain into shoulder d/t previous fall.  After session, pt returned to recliner with all needs met.   Barnie Mort Mobility Specialist Please contact via SecureChat or Rehab office at [phone]

## 2022-09-03 ENCOUNTER — Inpatient Hospital Stay (HOSPITAL_COMMUNITY): Payer: Medicare PPO

## 2022-09-03 DIAGNOSIS — J90 Pleural effusion, not elsewhere classified: Secondary | ICD-10-CM | POA: Diagnosis not present

## 2022-09-03 NOTE — Progress Notes (Signed)
PROGRESS NOTE    Nicole Graves  NWG:[id number] DOB: 1945-09-02 DOA: 08/29/2022 PCP: [name], MD   Brief Narrative: This 77 y.o. 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  The patient endorses exertional dyspnea. The patient was started on IV lasix.  Pulmonary recommended diuresis to euvolemia.  She's been transitioned to oral diuretics.  Hospitalization complicated by AKI and SVT.  Cardiology has been consulted.   Assessment & Plan:   Principal Problem:   Pleural effusion, bilateral Active Problems:   Pleural effusion  Recurrent bilateral pleural effusions, Left more than right,  Unclear Etiology: Suspect related to congestive heart failure. Last admission, the patient had right thoracentesis Pulmonary consulted and recommend diuresis prior to another thoracentesis (re-consult as needed). Continue IV lasix and midodrine-- watch for orthostatic vital signs Continue IV diuresis as tolerated, Will change to oral lasix in 1-2 days. -repeating CXR with moderate L effusion, smal R effusion. Continue Incentive spirometer -TED hose, may need abdominal binder -7/17--reconsult PCCM -> diuresis to euvolemia, switch to oral lasix   Exertional dyspnea, likely secondary to the above: Continue DuoNebs for dyspnea and wheezing. Continue to treat underlying conditions. Continue IV diuresis. Mobilize as tolerated -7/17--reconsult PCCM - recommending diuresis to euvolemia - if effusions fail to respond or enlarge despite diuresis, consider thoracocentesis.   Acute  on chronic HFpEF: Recent 2D echo on 08/21/2022 which showed LVEF 60 to 65% Trace lower extremity edema IV diuretics on hold, PO lasix ordered to start 7/19 Start strict I's and O's and daily weight -NEG 5 lbs   History of Orthostasis Follow up orthostatics. Continue midodrine.   SVT: - SVT on tele - has hx SVT s/p ablation 06/27/2022 - metoprolol resumed at lower dose,  - cardiology consulted, no EP workup needed.   Acute Kidney Injury: - in the setting of diuresis , Lasix held, Creatinine improving.   Hoarseness / Heartburn: Continue H2 blocker.   Chronic anxiety/depression Resume home regimen.   Iron deficiency anemia Hemoglobin is stable, resume home iron supplement No reported overt bleeding Trend H/H   Peripheral neuropathy Resume home gabapentin.   Physical debility PT OT assessment Orthostatic/Fall precautions. -lives with daughter and grandkids in [place]    Obesity Body mass index is 30.07 kg/m.     DVT prophylaxis: Lovenox Code Status:Full code Family Communication:No family at bed side. Disposition Plan:   Status is: Inpatient Remains inpatient appropriate because: continued need for inpatient care. Anticipated dc in 1-2 days.   Consultants:  Cardiology PCCM  Procedures:  Antimicrobials:  Anti-infectives (From admission, onward)    None      Subjective: Patient was seen and examined at bed side.  Overnight events noted. Patient reports doing fine she was sitting comfortably in the chair. She reports having shortness of breath on exertion.  Objective: Vitals:   09/03/22 0119 09/03/22 0535 09/03/22 0716 09/03/22 1020  BP:   116/67 104/71  Pulse: 85 92 91 99  Resp: 18 18 20 18   Temp: (!) 97.4 F (36.3 C) 98.4 F (36.9 C) 98.2 F (36.8 C) 98.4 F (36.9 C)  TempSrc: Oral Oral Oral Oral  SpO2: 96% 92% 93% 99%  Weight:  78.3 kg    Height:        Intake/Output Summary (Last 24 hours) at 09/03/2022 1105 Last data filed at  09/03/2022 0843 Gross per 24 hour  Intake 290 ml  Output --  Net 290 ml   Filed Weights   09/01/22 0538 09/02/22 0500 09/03/22 0535  Weight: 79.5 kg 79.3 kg 78.3 kg    Examination:  General exam: Appears calm and comfortable, not in any acute distress, comfortable sitting in the chair. Respiratory system: Decreased breath sound on LLL, CTA on Right. Respiratory effort normal. [id number] Cardiovascular system: S1 & S2 heard, RRR. No murmurs, rubs, gallops or clicks. No pedal edema. Gastrointestinal system: Abdomen is nondistended, soft and nontender. Normal bowel sounds heard. Central nervous system: Alert and oriented. No focal neurological deficits. Extremities: Symmetric 5 x 5 power. Skin: No rashes, lesions or ulcers Psychiatry: Judgement and insight appear normal. Mood & affect appropriate.     Data Reviewed: I have personally reviewed following labs and imaging studies  CBC: Recent Labs  Lab 08/29/22 1749 08/30/22 0607 09/01/22 2345  WBC 9.6 9.0 11.4*  NEUTROABS 6.5  --  7.4  HGB 8.8* 8.7* 8.1*  HCT 29.8* 29.2* 26.3*  MCV 95.2 94.5 93.3  PLT 668* 597* 624*   Basic Metabolic Panel: Recent Labs  Lab 08/29/22 1749 08/30/22 0607 08/31/22 0721 09/01/22 0043 09/01/22 2345  NA 139 135 135 135 136  K 3.9 3.8 3.5 3.4* 4.6  CL 101 101 97* 96* 102  CO2 24 24 25 27 26   GLUCOSE 102* 92 104* 98 98  BUN 18 16 28* 33* 28*  CREATININE 0.97 0.97 1.39* 1.52* 1.21*  CALCIUM 8.7* 8.5* 8.6* 8.8* 8.7*  MG  --  1.9 1.8 1.9 2.0  PHOS  --  2.8  --   --  2.7   GFR: Estimated Creatinine Clearance: 38.1 mL/min (A) (by C-G formula based on SCr of 1.21 mg/dL (H)). Liver Function Tests: Recent Labs  Lab 08/29/22 1749 09/01/22 2345  AST 14* 13*  ALT 11 10  ALKPHOS 84 64  BILITOT 0.4 0.1*  PROT 6.2* 5.7*  ALBUMIN 2.4* 2.3*   No results for input(s): "LIPASE", "AMYLASE" in the last 168 hours. No results for input(s): "AMMONIA" in the last 168 hours. Coagulation Profile: No  results for input(s): "INR", "PROTIME" in the last 168 hours. Cardiac Enzymes: No results for input(s): "CKTOTAL", "CKMB", "CKMBINDEX", "TROPONINI" in the last 168 hours. BNP (last 3 results) No results for input(s): "PROBNP" in the last 8760 hours. HbA1C: No results for input(s): "HGBA1C" in the last 72 hours. CBG: No results for input(s): "GLUCAP" in the last 168 hours. Lipid Profile: No results for input(s): "CHOL", "HDL", "LDLCALC", "TRIG", "CHOLHDL", "LDLDIRECT" in the last 72 hours. Thyroid Function Tests: No results for input(s): "TSH", "T4TOTAL", "FREET4", "T3FREE", "THYROIDAB" in the last 72 hours. Anemia Panel: No results for input(s): "VITAMINB12", "FOLATE", "FERRITIN", "TIBC", "IRON", "RETICCTPCT" in the last 72 hours. Sepsis Labs: No results for input(s): "PROCALCITON", "LATICACIDVEN" in the last 168 hours.  No results found for this or any previous visit (from the past 240 hour(s)).   Radiology Studies: No results found.  Scheduled Meds:  acetaminophen  1,000 mg Oral Q6H   aspirin EC  81 mg Oral Daily   atorvastatin  10 mg Oral QHS   buPROPion  100 mg Oral Daily   DULoxetine  90 mg Oral Daily   enoxaparin (LOVENOX) injection  40  mg Subcutaneous Q24H   ferrous sulfate  325 mg Oral Q breakfast   furosemide  40 mg Oral Daily   gabapentin  300 mg Oral TID   metoprolol tartrate  12.5 mg Oral BID   midodrine  5 mg Oral TID WC   Continuous Infusions:   LOS: 4 days    Time spent: 35 mins    Willeen Niece, MD [facility]   If 7PM-7AM, please contact night-coverage

## 2022-09-04 DIAGNOSIS — J90 Pleural effusion, not elsewhere classified: Secondary | ICD-10-CM | POA: Diagnosis not present

## 2022-09-04 LAB — BASIC METABOLIC PANEL
Anion gap: 13 (ref 5–15)
BUN: 30 mg/dL — ABNORMAL HIGH (ref 8–23)
CO2: 25 mmol/L (ref 22–32)
Calcium: 9 mg/dL (ref 8.9–10.3)
Chloride: 96 mmol/L — ABNORMAL LOW (ref 98–111)
Creatinine, Ser: 1.32 mg/dL — ABNORMAL HIGH (ref 0.44–1.00)
GFR, Estimated: 41 mL/min — ABNORMAL LOW (ref >60)
Glucose, Bld: 110 mg/dL — ABNORMAL HIGH (ref 70–99)
Potassium: 4 mmol/L (ref 3.5–5.1)
Sodium: 134 mmol/L — ABNORMAL LOW (ref 135–145)

## 2022-09-04 LAB — PHOSPHORUS: Phosphorus: 3.6 mg/dL (ref 2.5–4.6)

## 2022-09-04 LAB — CBC
HCT: 29.7 % — ABNORMAL LOW (ref 36.0–46.0)
Hemoglobin: 9 g/dL — ABNORMAL LOW (ref 12.0–15.0)
MCH: 27.9 pg (ref 26.0–34.0)
MCHC: 30.3 g/dL (ref 30.0–36.0)
MCV: 92 fL (ref 80.0–100.0)
Platelets: 628 10*3/uL — ABNORMAL HIGH (ref 150–400)
RBC: 3.23 MIL/uL — ABNORMAL LOW (ref 3.87–5.11)
RDW: 15 % (ref 11.5–15.5)
WBC: 11.3 10*3/uL — ABNORMAL HIGH (ref 4.0–10.5)
nRBC: 0 % (ref 0.0–0.2)

## 2022-09-04 LAB — MAGNESIUM: Magnesium: 2 mg/dL (ref 1.7–2.4)

## 2022-09-04 MED ORDER — FUROSEMIDE 10 MG/ML IJ SOLN
40.0000 mg | Freq: Every day | INTRAMUSCULAR | Status: DC
  Administered 2022-09-04 – 2022-09-05 (×2): 40 mg via INTRAVENOUS
  Filled 2022-09-04 (×2): qty 4

## 2022-09-04 NOTE — Progress Notes (Signed)
Mobility Specialist Progress Note:   09/04/22 0935  Mobility  Activity Ambulated with assistance in hallway  Level of Assistance Contact guard assist, steadying assist  Assistive Device None  Distance Ambulated (ft) 200 ft  Activity Response Tolerated fair  Mobility Referral Yes  $Mobility charge 1 Mobility  Mobility Specialist Start Time (ACUTE ONLY) 0935  Mobility Specialist Stop Time (ACUTE ONLY) 0945  Mobility Specialist Time Calculation (min) (ACUTE ONLY) 10 min    Pre Mobility: HR 58 During Mobility: HR 160 Post Mobility: HR 108  Pt eager for mobility session. Required only minG assist during ambulation. HR elevated with exertion up to 160bpm, pt displays SOB. Required x1 short standing rest break. Pt back in chair with all needs met.   Addison Lank Mobility Specialist Please contact via SecureChat or  Rehab office at [phone]

## 2022-09-04 NOTE — Plan of Care (Signed)

## 2022-09-04 NOTE — Progress Notes (Signed)
PROGRESS NOTE    Nicole Graves  ZOX:[id number] DOB: 1945-03-07 DOA: 08/29/2022 PCP: [name], MD   Brief Narrative: This 77 y.o. 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  The patient endorses exertional dyspnea. The patient was started on IV lasix.  Pulmonary recommended diuresis to euvolemia.  She's been transitioned to oral diuretics.  Hospitalization complicated by AKI and SVT.  Cardiology has been consulted.   Assessment & Plan:   Principal Problem:   Pleural effusion, bilateral Active Problems:   Pleural effusion  Recurrent bilateral pleural effusions, Left more than right,  Unclear Etiology: Suspect related to congestive heart failure. Last admission, the patient had right thoracentesis Pulmonary consulted and recommend diuresis prior to another thoracentesis (re-consult as needed). Continue IV lasix and midodrine-- watch for orthostatic vital signs. Continue IV diuresis as tolerated, Will change to oral lasix in 1-2 days. -repeating CXR with moderate L effusion, smal R effusion. Continue Incentive spirometer -TED hose, may need abdominal binder -7/17--reconsult PCCM -> diuresis to euvolemia, switch to oral lasix   Exertional dyspnea, likely secondary to the above: Continue DuoNebs for dyspnea and wheezing. Continue IV diuresis. Mobilize as tolerated -7/17--reconsult PCCM - recommending diuresis to euvolemia - if effusions fail to respond or enlarge despite diuresis, consider thoracocentesis.   Acute on chronic HFpEF: Recent 2D echo on  08/21/2022 which showed LVEF 60 to 65% Trace lower extremity edema PO lasix started on 7/19.  Lasix switched to IV 7/21  Start strict I's and O's and daily weight -NEG 5 lbs   History of Orthostasis Follow up orthostatics. Continue midodrine.   SVT: - SVT on tele - has hx SVT s/p ablation in 06/27/2022 - metoprolol resumed at lower dose,  - cardiology consulted, no EP workup needed.   Acute Kidney Injury: - in the setting of diuresis , Lasix continued, Creatinine improving.   Hoarseness / Heartburn: Continue H2 blocker.   Chronic anxiety/depression Resume home regimen.   Iron deficiency anemia Hemoglobin is stable, resume home iron supplement No reported overt bleeding Trend H/H   Peripheral neuropathy Resume home gabapentin.   Physical debility PT OT assessment Orthostatic/Fall precautions. -lives with daughter and grandkids in [place]    Obesity Body mass index is 30.07 kg/m.     DVT prophylaxis: Lovenox Code Status: Full code Family Communication:No family at bed side. Disposition Plan:   Status is: Inpatient Remains inpatient appropriate because: continued need for inpatient care. Anticipated dc in 1-2 days.   Consultants:  Cardiology PCCM  Procedures:  Antimicrobials:  Anti-infectives (From admission, onward)    None      Subjective: Patient was seen and examined at bed side.  Overnight events noted. Patient reports doing better.  Overall she is improving. She is still reports having shortness of breath on exertion.  Objective: Vitals:   09/03/22 2009 09/03/22 2357 09/04/22 0432 09/04/22 0900  BP: (!) 110/90 125/69 (!) 102/58 (!) 114/54  Pulse: 94 89 (!) 109 (!) 58  Resp: 18 17 19 20   Temp: 98.4 F (36.9 C) 98 F (36.7 C) 98.9 F (37.2 C) 98.9 F (37.2 C)  TempSrc: Oral  Oral Oral Oral  SpO2: 96% 95% 91% 96%  Weight:   78.7 kg   Height:        Intake/Output Summary (Last 24 hours) at 09/04/2022 1051 Last data filed at  09/04/2022 0900 Gross per 24 hour  Intake 540 ml  Output --  Net 540 ml   Filed Weights   09/02/22 0500 09/03/22 0535 09/04/22 0432  Weight: 79.3 kg 78.3 kg 78.7 kg    Examination:  General exam: Appears comfortable, not in any acute distress, comfortable sitting in the chair. Respiratory system: Decreased breath sound on LLL, CTA on Right. Respiratory effort normal. RR15 Cardiovascular system: S1 & S2 heard, RRR. No murmurs, No pedal edema. Gastrointestinal system: Abdomen is non distended, soft and non tender. Normal bowel sounds heard. Central nervous system: Alert and oriented. No focal neurological deficits. Extremities: Symmetric 5 x 5 power. Skin: No rashes, lesions or ulcers Psychiatry: Judgement and insight appear normal. Mood & affect appropriate.     Data Reviewed: I have personally reviewed following labs and imaging studies  CBC: Recent Labs  Lab 08/29/22 1749 08/30/22 0607 09/01/22 2345 09/04/22 0134  WBC 9.6 9.0 11.4* 11.3*  NEUTROABS 6.5  --  7.4  --   HGB 8.8* 8.7* 8.1* 9.0*  HCT 29.8* 29.2* 26.3* 29.7*  MCV 95.2 94.5 93.3 92.0  PLT 668* 597* 624* 628*   Basic Metabolic Panel: Recent Labs  Lab 08/30/22 0607 08/31/22 0721 09/01/22 0043 09/01/22 2345 09/04/22 0134  NA 135 135 135 136 134*  K 3.8 3.5 3.4* 4.6 4.0  CL 101 97* 96* 102 96*  CO2 24 25 27 26 25   GLUCOSE 92 104* 98 98 110*  BUN 16 28* 33* 28* 30*  CREATININE 0.97 1.39* 1.52* 1.21* 1.32*  CALCIUM 8.5* 8.6* 8.8* 8.7* 9.0  MG 1.9 1.8 1.9 2.0 2.0  PHOS 2.8  --   --  2.7 3.6   GFR: Estimated Creatinine Clearance: 35.1 mL/min (A) (by C-G formula based on SCr of 1.32 mg/dL (H)). Liver Function Tests: Recent Labs  Lab 08/29/22 1749 09/01/22 2345  AST 14* 13*  ALT 11 10  ALKPHOS 84 64  BILITOT 0.4 0.1*  PROT 6.2* 5.7*  ALBUMIN 2.4* 2.3*   No results for input(s): "LIPASE", "AMYLASE" in the last 168 hours. No results for input(s): "AMMONIA" in the last 168 hours. Coagulation  Profile: No results for input(s): "INR", "PROTIME" in the last 168 hours. Cardiac Enzymes: No results for input(s): "CKTOTAL", "CKMB", "CKMBINDEX", "TROPONINI" in the last 168 hours. BNP (last 3 results) No results for input(s): "PROBNP" in the last 8760 hours. HbA1C: No results for input(s): "HGBA1C" in the last 72 hours. CBG: No results for input(s): "GLUCAP" in the last 168 hours. Lipid Profile: No results for input(s): "CHOL", "HDL", "LDLCALC", "TRIG", "CHOLHDL", "LDLDIRECT" in the last 72 hours. Thyroid Function Tests: No results for input(s): "TSH", "T4TOTAL", "FREET4", "T3FREE", "THYROIDAB" in the last 72 hours. Anemia Panel: No results for input(s): "VITAMINB12", "FOLATE", "FERRITIN", "TIBC", "IRON", "RETICCTPCT" in the last 72 hours. Sepsis Labs: No results for input(s): "PROCALCITON", "LATICACIDVEN" in the last 168 hours.  No results found for this or any previous visit (from the past 240 hour(s)).   Radiology Studies: DG Chest 2 View  Result Date: 09/03/2022 CLINICAL DATA:  Pleural effusion. EXAM: CHEST - 2 VIEW COMPARISON:  Chest radiograph dated 08/31/2022. FINDINGS: The heart is enlarged. Moderate bilateral pleural effusions with associated atelectasis/airspace disease appear unchanged on the left and increased on the right  compared to 08/31/2022. No pneumothorax. Degenerative changes are seen in the spine. IMPRESSION: Moderate bilateral pleural effusions with associated atelectasis/airspace disease, increased on the right compared to 08/31/2022. Electronically Signed   By: Romona Curls M.D.   On: 09/03/2022 15:32    Scheduled Meds:  acetaminophen  1,000 mg Oral Q6H   aspirin EC  81 mg Oral Daily   atorvastatin  10 mg Oral QHS   buPROPion  100 mg Oral Daily   DULoxetine  90 mg Oral Daily   enoxaparin (LOVENOX) injection  40 mg Subcutaneous Q24H   ferrous sulfate  325 mg Oral Q breakfast   furosemide  40 mg Intravenous Daily   gabapentin  300 mg Oral TID   metoprolol  tartrate  12.5 mg Oral BID   midodrine  5 mg Oral TID WC   Continuous Infusions:   LOS: 5 days    Time spent: 35 mins    Willeen Niece, MD [facility]   If 7PM-7AM, please contact night-coverage

## 2022-09-05 ENCOUNTER — Inpatient Hospital Stay (HOSPITAL_COMMUNITY): Payer: Medicare PPO

## 2022-09-05 ENCOUNTER — Other Ambulatory Visit: Payer: Self-pay

## 2022-09-05 DIAGNOSIS — J90 Pleural effusion, not elsewhere classified: Secondary | ICD-10-CM | POA: Diagnosis not present

## 2022-09-05 LAB — BASIC METABOLIC PANEL
Anion gap: 10 (ref 5–15)
BUN: 23 mg/dL (ref 8–23)
CO2: 25 mmol/L (ref 22–32)
Calcium: 8.4 mg/dL — ABNORMAL LOW (ref 8.9–10.3)
Chloride: 98 mmol/L (ref 98–111)
Creatinine, Ser: 1.13 mg/dL — ABNORMAL HIGH (ref 0.44–1.00)
GFR, Estimated: 49 mL/min — ABNORMAL LOW (ref >60)
Glucose, Bld: 89 mg/dL (ref 70–99)
Potassium: 4.1 mmol/L (ref 3.5–5.1)
Sodium: 133 mmol/L — ABNORMAL LOW (ref 135–145)

## 2022-09-05 LAB — CREATININE, SERUM
Creatinine, Ser: 1.14 mg/dL — ABNORMAL HIGH (ref 0.44–1.00)
GFR, Estimated: 49 mL/min — ABNORMAL LOW (ref >60)

## 2022-09-05 LAB — BRAIN NATRIURETIC PEPTIDE: B Natriuretic Peptide: 96.8 pg/mL (ref 0.0–100.0)

## 2022-09-05 LAB — MAGNESIUM: Magnesium: 2.2 mg/dL (ref 1.7–2.4)

## 2022-09-05 MED ORDER — FUROSEMIDE 10 MG/ML IJ SOLN
40.0000 mg | Freq: Two times a day (BID) | INTRAMUSCULAR | Status: DC
  Administered 2022-09-05 – 2022-09-06 (×2): 40 mg via INTRAVENOUS
  Filled 2022-09-05 (×2): qty 4

## 2022-09-05 NOTE — Consult Note (Signed)
NAME:  Nicole Graves, MRN:  [id number], DOB:  1945/11/19, LOS: 6 ADMISSION DATE:  08/29/2022, CONSULTATION DATE:  09/05/2022 REFERRING MD: Idelle Leech , CHIEF COMPLAINT:  Pleural effusions not responsive to diuresis   History of Present Illness:  Nicole Graves is a 77 year old woman with a history of aortic stenosis status post TAVR, tricuspid regurgitation, SVT, status post ablation, CKD 3B, hypertension who presented 08/29/2022 with shortness of breath that has worsened over the last week.  She was in the hospital one  week prior and received diuresis but was not sent home on diuretics due to orthostatic hypotension.  She had right-sided thoracentesis with pseudo exudative effusion.  In the emergency department she had a chest x-ray demonstrating bilateral pleural effusions and pulmonary critical care was consulted for evaluation.  She denies a history of malignancy or tobacco use.  She has never been on outpatient diuretics before.  She notes that she had a fall a few weeks ago but this was after turning her head only.  She has learned to sit up and stand up more slowly to prevent significant symptoms  PCCM were consulted 08/30/2022 for thoracentesis. At that time Dr. Chestine Spore suggested diuresis. Pt. Has been diuresed for 6 days. She is on [employer] . She does still have exertional dyspnea. She is net - 70 cc per I&O, I am unsure if this is accurate.CXR  shows moderate bilateral pleural effusions with associated atelectasis/airspace disease. PCCM have been re-consulted to consider thoracentesis.    Pertinent  Medical History  Aortic stenosis status post TAVR in 2023 Orthostatic hypotension Hypertension Hyperlipidemia Tricuspid regurgitation  Significant Hospital Events: Including procedures, antibiotic start and stop dates in addition to other pertinent events   Admitted 7/15 Initial PCCM consult 7/16>> recommended diuresis PCCM reconsulted 7/22 for possible consult  Interim History / Subjective:  Net  negative 70 cc's on Lasix 40 Daily Weight is up to 83.5 KG today Pt. States she has shortness of breath with exertion She is on RA with oxygen sats of 95% States she is not having good response to lasix  Objective   Blood pressure 119/70, pulse (!) 107, temperature 98.2 F (36.8 C), temperature source Oral, resp. rate 20, height 5\' 4"  (1.626 m), weight 83.5 kg, SpO2 95%.        Intake/Output Summary (Last 24 hours) at 09/05/2022 1134 Last data filed at 09/05/2022 4696 Gross per 24 hour  Intake 480 ml  Output 1200 ml  Net -720 ml   Filed Weights   09/03/22 0535 09/04/22 0432 09/05/22 0504  Weight: 78.3 kg 78.7 kg 83.5 kg    Examination: General: Elderly female supine in bed, on RA in NAD, speaking in complete sentences HENT: NCAT, No LAD, MM dry Lungs:  Bilateral chest excursion, Clear upper lobes with faint exp wheeze, diminished per bases L>R Cardiovascular: S1, S2, Currently in atrial fibrillation rate of 100 Abdomen:  Soft, NT, ND, BS + Extremities:  TEDS hose in place , warm to tough, brisk refill, minimal edema Neuro:  Awake and alert and oriented x 3, MAE x 4, appropriate GU: NA>> Using potty chair to void  Resolved Hospital Problem list     Assessment & Plan:  Bilateral pleural effusions , previously  drained right pseudo exudative effusion 08/19/2022 for 650 cc pleural fluid with white fibrinous clots  , possibly consistent with diuresis prior. Diuresis x 6 days per pulmonary recommendation on 7/16 With Hx of othostatic hypotension Will likely need  midodrine as a chronic  med to help tolerate a home diuretic regimen  Net negative 70 cc's on Lasix 40 daily Weigh gain today to 83.5 kg Continued moderate pleural effusions on imaging and DOE Pt. On RA with sats of 95% Plan Continue diuresis , increase Lasix to 40 mg BID as renal function allows Consider adding Diamox Continue midodrine and monitor for orthostatic BP Close monitoring of creatinine and K, Mag. Strict  I&O Daily weights Low sodium diet Trend BNP, last check 7 days ago Trend CXR PCCM will continue to follow , if no improvement , or if renal function worsens will consider left thoracentesis.  Pt. Is in atrial fibrillation . Will check Mag as an add on , BNP as an add on and K is  pending . May need repletion by Primary team if they are low.    Best Practice (right click and "Reselect all SmartList Selections" daily)  All best practice per Primary Team Last date of multidisciplinary goals of care discussion [Pt. Updated at bedside 7/22 regarding pulmonary plan  Labs   CBC: Recent Labs  Lab 08/29/22 1749 08/30/22 0607 09/01/22 2345 09/04/22 0134  WBC 9.6 9.0 11.4* 11.3*  NEUTROABS 6.5  --  7.4  --   HGB 8.8* 8.7* 8.1* 9.0*  HCT 29.8* 29.2* 26.3* 29.7*  MCV 95.2 94.5 93.3 92.0  PLT 668* 597* 624* 628*    Basic Metabolic Panel: Recent Labs  Lab 08/30/22 0607 08/31/22 0721 09/01/22 0043 09/01/22 2345 09/04/22 0134 09/05/22 0601  NA 135 135 135 136 134*  --   K 3.8 3.5 3.4* 4.6 4.0  --   CL 101 97* 96* 102 96*  --   CO2 24 25 27 26 25   --   GLUCOSE 92 104* 98 98 110*  --   BUN 16 28* 33* 28* 30*  --   CREATININE 0.97 1.39* 1.52* 1.21* 1.32* 1.14*  CALCIUM 8.5* 8.6* 8.8* 8.7* 9.0  --   MG 1.9 1.8 1.9 2.0 2.0  --   PHOS 2.8  --   --  2.7 3.6  --    GFR: Estimated Creatinine Clearance: 41.8 mL/min (A) (by C-G formula based on SCr of 1.14 mg/dL (H)). Recent Labs  Lab 08/29/22 1749 08/30/22 0607 09/01/22 2345 09/04/22 0134  WBC 9.6 9.0 11.4* 11.3*    Liver Function Tests: Recent Labs  Lab 08/29/22 1749 09/01/22 2345  AST 14* 13*  ALT 11 10  ALKPHOS 84 64  BILITOT 0.4 0.1*  PROT 6.2* 5.7*  ALBUMIN 2.4* 2.3*   No results for input(s): "LIPASE", "AMYLASE" in the last 168 hours. No results for input(s): "AMMONIA" in the last 168 hours.  ABG    Component Value Date/Time   PHART 7.402 03/12/2021 1107   PCO2ART 41.5 03/12/2021 1107   PO2ART 98.0  03/12/2021 1107   HCO3 25.3 03/12/2021 1107   TCO2 26 03/16/2021 1041   O2SAT 97.6 03/12/2021 1107     Coagulation Profile: No results for input(s): "INR", "PROTIME" in the last 168 hours.  Cardiac Enzymes: No results for input(s): "CKTOTAL", "CKMB", "CKMBINDEX", "TROPONINI" in the last 168 hours.  HbA1C: Hgb A1c MFr Bld  Date/Time Value Ref Range Status  09/23/2021 04:55 AM 4.9 4.8 - 5.6 % Final    Comment:    (NOTE) Pre diabetes:          5.7%-6.4%  Diabetes:              >6.4%  Glycemic control for   <7.0% adults  with diabetes     CBG: No results for input(s): "GLUCAP" in the last 168 hours.  Review of Systems:   Shortness of breath with exertion  Chest heaviness  Past Medical History:  She,  has a past medical history of Anemia, Anxiety, Arthritis, Chronic lower back pain, Colon polyp (1999), Depression, GERD (gastroesophageal reflux disease), HTN (hypertension), Hyperlipidemia, Mild aortic insufficiency, Mild mitral regurgitation, Orthostatic hypotension, PAT (paroxysmal atrial tachycardia), PONV (postoperative nausea and vomiting), S/P TAVR (transcatheter aortic valve replacement) (03/16/2021), Severe aortic stenosis, Stage 3b chronic kidney disease (CKD) (HCC), SVT (supraventricular tachycardia), and TR (tricuspid regurgitation) (09/19/2016).   Surgical History:   Past Surgical History:  Procedure Laterality Date   CESAREAN SECTION  1964   X2   CHOLECYSTECTOMY     COSMETIC SURGERY  1980   removal extra fat inner thighs   EYE SURGERY Bilateral     Cataract extraction with IOL   INTRAOPERATIVE TRANSTHORACIC ECHOCARDIOGRAM N/A 03/16/2021   Procedure: INTRAOPERATIVE TRANSTHORACIC ECHOCARDIOGRAM;  Surgeon: Orbie Pyo, MD;  Location: [facility];  Service: Open Heart Surgery;  Laterality: N/A;   RIGHT HEART CATH AND CORONARY ANGIOGRAPHY N/A 02/17/2021   Procedure: RIGHT HEART CATH AND CORONARY ANGIOGRAPHY;  Surgeon: Orbie Pyo, MD;  Location: [facility] INVASIVE CV LAB;   Service: Cardiovascular;  Laterality: N/A;   SVT ABLATION N/A 06/27/2022   Procedure: SVT ABLATION;  Surgeon: Marinus Maw, MD;  Location: [facility] INVAS[facility] CV LAB;  Service: Cardiovascular;  Laterality: N/A;   TOTAL KNEE ARTHROPLASTY Right 11/30/2015   Procedure: RIGHT TOTAL KNEE ARTHROPLASTY;  Surgeon: Ollen Gross, MD;  Location: [facility] ORS;  Service: Orthopedics;  Laterality: Right;   TOTAL KNEE ARTHROPLASTY Left 11/14/2016   Procedure: LEFT TOTAL KNEE ARTHROPLASTY;  Surgeon: Ollen Gross, MD;  Location: [facility] ORS;  Service: Orthopedics;  Laterality: Left;   TRANSCATHETER AORTIC VALVE REPLACEMENT, TRANSFEMORAL Right 03/16/2021   Procedure: TRANSCATHETER AORTIC VALVE REPLACEMENT, TRANSFEMORAL;  Surgeon: Orbie Pyo, MD;  Location: [facility] OR;  Service: Open Heart Surgery;  Laterality: Right;     Social History:   reports that she has never smoked. She has never used smokeless tobacco. She reports that she does not drink alcohol and does not use drugs.   Family History:  Her family history includes Colon cancer in her mother; Heart attack in her brother; Hypertension in her brother. There is no history of Stroke.   Allergies Allergies  Allergen Reactions   Sulfa Antibiotics Nausea And Vomiting and Other (See Comments)   Ativan [Lorazepam] Other (See Comments)    Hyperactive    Tizanidine Hcl Other (See Comments)    Pt does not remember      Home Medications  Prior to Admission medications   Medication Sig Start Date End Date Taking? Authorizing Provider  acetaminophen (TYLENOL) 650 MG CR tablet Take 1,300 mg by mouth in the morning and at bedtime.   Yes [provider]  alendronate (FOSAMAX) 70 MG tablet Take 70 mg by mouth every Thursday.   Yes [provider]  aspirin EC 81 MG tablet Take 81 mg by mouth daily. Swallow whole.   Yes [provider]  atorvastatin (LIPITOR) 10 MG tablet Take 10 mg by mouth at bedtime.  02/11/15  Yes [provider]   buPROPion (WELLBUTRIN) 100 MG tablet Take 100 mg by mouth daily.   Yes [provider]  cyclobenzaprine (FLEXERIL) 10 MG tablet Take 1 tablet (10 mg total) by mouth at bedtime. 04/14/22  Yes Medina-Vargas, Monina  C, NP  DULoxetine (CYMBALTA) 30 MG capsule Take 90 mg by mouth daily after breakfast.   Yes [provider]  famotidine (PEPCID) 20 MG tablet Take 20 mg by mouth daily as needed for heartburn.   Yes [provider]  ferrous sulfate 325 (65 FE) MG tablet Take 325 mg by mouth daily.   Yes [provider]  gabapentin (NEURONTIN) 300 MG capsule Take 300 mg by mouth 3 (three) times daily. 12/20/19  Yes [provider]  Melatonin 5 MG CAPS Take 1 capsule by mouth at bedtime.   Yes [provider]  metoprolol succinate (TOPROL-XL) 50 MG 24 hr tablet Take 1 tablet (50 mg total) by mouth daily. Take with or immediately following a meal. You may take an extra 50 mg dose if you experience palpitations. 04/20/22  Yes Meriam Sprague, MD  lidocaine (LIDODERM) 5 % Place 1 patch onto the skin daily. Remove & Discard patch within 12 hours or as directed by MD. Apply to site of pain Patient not taking: Reported on 08/29/2022 09/27/21   Joycelyn Das, MD     Pulmonary APP Time : 35 minutes   Bevelyn Ngo, MSN, AGACNP-BC [facility] Pulmonary/Critical Care Medicine See Amion for personal pager PCCM on call pager [phone]  09/05/2022 1:44 PM

## 2022-09-05 NOTE — Progress Notes (Signed)
PT Cancellation Note  Patient Details Name: Nicole Graves MRN: [id number] DOB: September 22, 1945   Cancelled Treatment:    Reason Eval/Treat Not Completed: Medical issues which prohibited therapy - pt in and out of afib rhythm up to 170s bpm, per RN hold PT until MD sees pt.   Marye Round, PT DPT Acute Rehabilitation Services Secure Chat Preferred  Office [phone]    [name] 09/05/2022, 12:35 PM

## 2022-09-05 NOTE — Progress Notes (Signed)
PROGRESS NOTE    Nicole Graves  WRU:[id number] DOB: 05-Mar-1945 DOA: 08/29/2022 PCP: [name], MD   Brief Narrative: This 77 y.o. 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  The patient endorses exertional dyspnea. The patient was started on IV lasix.  Pulmonary recommended diuresis to euvolemia.  She's been transitioned to oral diuretics.  Hospitalization complicated by AKI and SVT.  Cardiology has been consulted.   Assessment & Plan:   Principal Problem:   Pleural effusion, bilateral Active Problems:   Pleural effusion  Recurrent bilateral pleural effusions, Left more than right,  Unclear Etiology: Suspect related to congestive heart failure. Last admission, the patient had right thoracentesis. Pulmonary consulted and recommend diuresis prior to another thoracentesis (re-consult as needed). Continue IV lasix and midodrine-- watch for orthostatic vital signs. Continue IV diuresis as tolerated, Will change to oral lasix in 1-2 days. -repeating CXR with moderate L effusion, smal R effusion. Continue Incentive spirometer -TED hose, may need abdominal binder. -7/17--reconsult PCCM -> diuresis to euvolemia, continue iv lasix. -Repeat chest x-ray showed persistent moderate bilateral pleural effusion. -Pulmonology re-consulted.   Exertional dyspnea, likely secondary to the above: Continue DuoNebs for dyspnea and wheezing. Continue IV diuresis. Mobilize as tolerated -7/17--reconsult PCCM - recommending diuresis to euvolemia - if effusions fail to respond or  enlarge despite diuresis, consider thoracocentesis.   Acute on chronic HFpEF: Recent 2D echo on 08/21/2022 which showed LVEF 60 to 65% Trace lower extremity edema PO lasix started on 7/19.  Lasix switched to IV  on 7/21  Start strict I's and O's and daily weight -Neg 6 lbs.   History of Orthostasis: Follow up orthostatics. Continue midodrine.   SVT: - SVT on tele - has hx. SVT s/p ablation in 06/27/2022. - Metoprolol resumed at lower dose,  - Cardiology consulted, no EP workup needed.   Acute Kidney Injury: - in the setting of diuresis , Lasix continued, Creatinine improving.   Hoarseness / Heartburn: Continue H2 blocker.   Chronic anxiety/depression Resume home regimen.   Iron deficiency anemia Hemoglobin is stable, resume home iron supplement No reported overt bleeding Trend H/H   Peripheral neuropathy Continue home gabapentin.   Physical debility PT OT assessment. Orthostatic/Fall precautions. -lives with daughter and grandkids in [place]    Obesity Body mass index is 30.07 kg/m.     DVT prophylaxis: Lovenox Code Status: Full code Family Communication:No family at bed side. Disposition Plan:   Status is: Inpatient Remains inpatient appropriate because: continued need for inpatient care. Anticipated dc in 1-2 days.   Consultants:  Cardiology PCCM  Procedures:  Antimicrobials:  Anti-infectives (From admission, onward)    None      Subjective: Patient was seen and examined at bed side.  Overnight events noted. Patient reports doing better,  still reports having shortness of breath while walking. Repeat chest x-ray showed persistent moderate bilateral pleural effusion.  Objective: Vitals:   09/05/22 0130 09/05/22 0504 09/05/22 0505 09/05/22 0815  BP:   111/72 120/70  Pulse: 89   (!) 108  Resp: 17  18 16   Temp: 98 F (36.7 C)  97.8 F (36.6  C) 98.8 F (37.1 C)  TempSrc: Oral  Oral Oral  SpO2: 94%  93% 95%  Weight:  83.5 kg    Height:         Intake/Output Summary (Last 24 hours) at 09/05/2022 1104 Last data filed at 09/05/2022 1610 Gross per 24 hour  Intake 480 ml  Output 1200 ml  Net -720 ml   Filed Weights   09/03/22 0535 09/04/22 0432 09/05/22 0504  Weight: 78.3 kg 78.7 kg 83.5 kg    Examination:  General exam: Appears comfortable, deconditioned, not in any acute distress. Respiratory system: Decreased breath sound on LLL, CTA on Right. Respiratory effort normal. [id number] Cardiovascular system: S1 & S2 heard, RRR. No murmurs, No pedal edema. Gastrointestinal system: Abdomen is non distended, soft and non tender. Normal bowel sounds heard. Central nervous system: Alert and oriented. No focal neurological deficits. Extremities: Symmetric 5 x 5 power. Skin: No rashes, lesions or ulcers Psychiatry: Judgement and insight appear normal. Mood & affect appropriate.     Data Reviewed: I have personally reviewed following labs and imaging studies  CBC: Recent Labs  Lab 08/29/22 1749 08/30/22 0607 09/01/22 2345 09/04/22 0134  WBC 9.6 9.0 11.4* 11.3*  NEUTROABS 6.5  --  7.4  --   HGB 8.8* 8.7* 8.1* 9.0*  HCT 29.8* 29.2* 26.3* 29.7*  MCV 95.2 94.5 93.3 92.0  PLT 668* 597* 624* 628*   Basic Metabolic Panel: Recent Labs  Lab 08/30/22 0607 08/31/22 0721 09/01/22 0043 09/01/22 2345 09/04/22 0134 09/05/22 0601  NA 135 135 135 136 134*  --   K 3.8 3.5 3.4* 4.6 4.0  --   CL 101 97* 96* 102 96*  --   CO2 24 25 27 26 25   --   GLUCOSE 92 104* 98 98 110*  --   BUN 16 28* 33* 28* 30*  --   CREATININE 0.97 1.39* 1.52* 1.21* 1.32* 1.14*  CALCIUM 8.5* 8.6* 8.8* 8.7* 9.0  --   MG 1.9 1.8 1.9 2.0 2.0  --   PHOS 2.8  --   --  2.7 3.6  --    GFR: Estimated Creatinine Clearance: 41.8 mL/min (A) (by C-G formula based on SCr of 1.14 mg/dL (H)). Liver Function Tests: Recent Labs  Lab 08/29/22 1749 09/01/22 2345  AST 14* 13*  ALT 11 10  ALKPHOS 84 64  BILITOT 0.4 0.1*  PROT 6.2* 5.7*  ALBUMIN 2.4* 2.3*   No  results for input(s): "LIPASE", "AMYLASE" in the last 168 hours. No results for input(s): "AMMONIA" in the last 168 hours. Coagulation Profile: No results for input(s): "INR", "PROTIME" in the last 168 hours. Cardiac Enzymes: No results for input(s): "CKTOTAL", "CKMB", "CKMBINDEX", "TROPONINI" in the last 168 hours. BNP (last 3 results) No results for input(s): "PROBNP" in the last 8760 hours. HbA1C: No results for input(s): "HGBA1C" in the last 72 hours. CBG: No results for input(s): "GLUCAP" in the last 168 hours. Lipid Profile: No results for input(s): "CHOL", "HDL", "LDLCALC", "TRIG", "CHOLHDL", "LDLDIRECT" in the last 72 hours. Thyroid Function Tests: No results for input(s): "TSH", "T4TOTAL", "FREET4", "T3FREE", "THYROIDAB" in the last 72 hours. Anemia Panel: No results for input(s): "VITAMINB12", "FOLATE", "FERRITIN", "TIBC", "IRON", "RETICCTPCT" in the last 72 hours. Sepsis Labs: No results for input(s): "PROCALCITON", "LATICACIDVEN" in the last 168 hours.  No results found for this or any previous visit (from the past 240 hour(s)).   Radiology Studies: DG Chest 2 View  Result Date: 09/03/2022 CLINICAL DATA:  Pleural  effusion. EXAM: CHEST - 2 VIEW COMPARISON:  Chest radiograph dated 08/31/2022. FINDINGS: The heart is enlarged. Moderate bilateral pleural effusions with associated atelectasis/airspace disease appear unchanged on the left and increased on the right compared to 08/31/2022. No pneumothorax. Degenerative changes are seen in the spine. IMPRESSION: Moderate bilateral pleural effusions with associated atelectasis/airspace disease, increased on the right compared to 08/31/2022. Electronically Signed   By: Romona Curls M.D.   On: 09/03/2022 15:32    Scheduled Meds:  acetaminophen  1,000 mg Oral Q6H   aspirin EC  81 mg Oral Daily   atorvastatin  10 mg Oral QHS   buPROPion  100 mg Oral Daily   DULoxetine  90 mg Oral Daily   enoxaparin (LOVENOX) injection  40 mg  Subcutaneous Q24H   ferrous sulfate  325 mg Oral Q breakfast   furosemide  40 mg Intravenous Daily   gabapentin  300 mg Oral TID   metoprolol tartrate  12.5 mg Oral BID   midodrine  5 mg Oral TID WC   Continuous Infusions:   LOS: 6 days    Time spent: 35 mins    Willeen Niece, MD [facility]   If 7PM-7AM, please contact night-coverage

## 2022-09-05 NOTE — Consult Note (Signed)
   [facility] Inpatient Consult   09/05/2022  Nicole Graves 1945/07/11 [id number]  [facility] [[facility]]  Accountable Care Organization [ACO] Patient: [employer]  Primary Care Provider:  [name], MD with [facility]   Patient screened for readmission less than 7 days hospitalization with noted high risk score for unplanned readmission risk with a 6 day length of stay and  to assess for post hospital transition for care coordination.  Review of patient's electronic medical record reveals patient is currently in and out of AFIB per notes today. Met with the patient at the bedside.  Patient endorses PCP.  Patient states she feels terrible and feels worst today.  She states the fluid in her chest is affecting her heart but wanted the fluid removed like last week but she is supposed to get a high dose of diuretics.  She states she has good support at home and doesn't mind her PCP office follow up calls.    Plan:  Continue to follow progress and disposition to assess for post hospital community care coordination/management needs.  Referral: disposition not known can follow up with the [facility] provider team for needs.  Of note, [facility] Care Management/Population Health does not replace or interfere with any arrangements made by the Inpatient Transition of Care team.  For questions contact:   Charlesetta Shanks, RN BSN CCM [facility]  [phone] business mobile phone Toll free office [phone]  *Concierge Line  [phone] Fax number: [phone] [email or web address]

## 2022-09-05 NOTE — Plan of Care (Signed)
  Problem: Education: Goal: Knowledge of General Education information will improve Description Including pain rating scale, medication(s)/side effects and non-pharmacologic comfort measures Outcome: Progressing   Problem: Health Behavior/Discharge Planning: Goal: Ability to manage health-related needs will improve Outcome: Progressing   

## 2022-09-05 NOTE — Progress Notes (Signed)
Physical Therapy Treatment Patient Details Name: Nicole Graves MRN: [id number] DOB: 1945/05/22 Today's Date: 09/05/2022   History of Present Illness Nicole Graves is a 77 y.o. female who presents to the ED sent from her primary care provider due to recurrent bilateral pleural effusions left greater than right seen on chest x-ray done at PCPs office. PMH significant for SVT status post ablation on 06/27/2022, severe AS status post TAVR in 2023, chronic HFpEF, CKD stage IIIa, hyperlipidemia, chronic anxiety/depression, recently admitted from 08/19/2022 through 08/22/2022 for bilateral pleural effusions, status post right thoracentesis.    PT Comments  RN okayed pt participating in PT this afternoon, HR in the 80s upon PT arrival to room. Pt appears anxious about mobility due to HR elevation earlier today, and reports SOB. Pt ambulatory for short hallway distance only, states she has a headache once mobilizing which is new onset. SpO2 95% post-gait, HR 80-114 bpm during session, BP 134/68 post-gait. RN informed about vitals and pt reports of headache. Plan remains appropriate.      Assistance Recommended at Discharge Intermittent Supervision/Assistance  If plan is discharge home, recommend the following:  Can travel by private vehicle    A little help with walking and/or transfers;A little help with bathing/dressing/bathroom;Assistance with cooking/housework;Assist for transportation;Help with stairs or ramp for entrance      Equipment Recommendations  None recommended by PT    Recommendations for Other Services       Precautions / Restrictions Precautions Precautions: Fall Restrictions Weight Bearing Restrictions: No     Mobility  Bed Mobility Overal bed mobility: Needs Assistance Bed Mobility: Supine to Sit, Sit to Supine     Supine to sit: Supervision, HOB elevated Sit to supine: Supervision, HOB elevated        Transfers Overall transfer level: Needs assistance Equipment used:  None Transfers: Sit to/from Stand Sit to Stand: Supervision           General transfer comment: for safety, slow to rise and poor eccentric control moving stand>sit so sit<>stands practiced x3    Ambulation/Gait Ambulation/Gait assistance: Supervision Gait Distance (Feet): 75 Feet Assistive device: None Gait Pattern/deviations: Step-through pattern, Decreased stride length Gait velocity: decr     General Gait Details: for safety, HR 80-114 bpm during gait   Stairs             Wheelchair Mobility     Tilt Bed    Modified Rankin (Stroke Patients Only)       Balance Overall balance assessment: Needs assistance Sitting-balance support: Feet unsupported, No upper extremity supported       Standing balance support: During functional activity Standing balance-Leahy Scale: Fair                              Cognition Arousal/Alertness: Awake/alert Behavior During Therapy: WFL for tasks assessed/performed Overall Cognitive Status: Within Functional Limits for tasks assessed                                 General Comments: anxious about HR elevating earlier today, self-limiting        Exercises      General Comments General comments (skin integrity, edema, etc.): SpO2 95% post-gait, HR 80-114 bpm during session, BP 134/68 post-gait      Pertinent Vitals/Pain Pain Assessment Pain Assessment: Faces Faces Pain Scale: Hurts little more Pain Location: head Pain  Descriptors / Indicators: Headache Pain Intervention(s): Limited activity within patient's tolerance, Monitored during session, Repositioned    Home Living                          Prior Function            PT Goals (current goals can now be found in the care plan section) Acute Rehab PT Goals Patient Stated Goal: return to independence PT Goal Formulation: With patient Time For Goal Achievement: 09/13/22 Potential to Achieve Goals: Good Progress  towards PT goals: Progressing toward goals    Frequency    Min 1X/week      PT Plan Current plan remains appropriate    Co-evaluation              AM-PAC PT "6 Clicks" Mobility   Outcome Measure  Help needed turning from your back to your side while in a flat bed without using bedrails?: A Little Help needed moving from lying on your back to sitting on the side of a flat bed without using bedrails?: A Little Help needed moving to and from a bed to a chair (including a wheelchair)?: A Little Help needed standing up from a chair using your arms (e.g., wheelchair or bedside chair)?: A Little Help needed to walk in hospital room?: A Little Help needed climbing 3-5 steps with a railing? : A Lot 6 Click Score: 17    End of Session   Activity Tolerance: Patient tolerated treatment well;Patient limited by fatigue;Other (comment) (anxiety about HR) Patient left: in bed;with call bell/phone within reach;with bed alarm set Nurse Communication: Mobility status PT Visit Diagnosis: Other abnormalities of gait and mobility (R26.89);Muscle weakness (generalized) (M62.81);Repeated falls (R29.6);Difficulty in walking, not elsewhere classified (R26.2)     Time: 4098-1191 PT Time Calculation (min) (ACUTE ONLY): 10 min  Charges:    $Therapeutic Activity: 8-22 mins PT General Charges $$ ACUTE PT VISIT: 1 Visit                     Marye Round, PT DPT Acute Rehabilitation Services Secure Chat Preferred  Office [phone]    Camarie Mctigue E Christain [place] 09/05/2022, 4:30 PM

## 2022-09-06 ENCOUNTER — Inpatient Hospital Stay (HOSPITAL_COMMUNITY): Payer: Medicare PPO

## 2022-09-06 DIAGNOSIS — J90 Pleural effusion, not elsewhere classified: Secondary | ICD-10-CM | POA: Diagnosis not present

## 2022-09-06 LAB — BASIC METABOLIC PANEL
Anion gap: 13 (ref 5–15)
BUN: 29 mg/dL — ABNORMAL HIGH (ref 8–23)
CO2: 27 mmol/L (ref 22–32)
Calcium: 8.7 mg/dL — ABNORMAL LOW (ref 8.9–10.3)
Chloride: 95 mmol/L — ABNORMAL LOW (ref 98–111)
Creatinine, Ser: 1.4 mg/dL — ABNORMAL HIGH (ref 0.44–1.00)
GFR, Estimated: 38 mL/min — ABNORMAL LOW (ref >60)
Glucose, Bld: 109 mg/dL — ABNORMAL HIGH (ref 70–99)
Potassium: 3.5 mmol/L (ref 3.5–5.1)
Sodium: 135 mmol/L (ref 135–145)

## 2022-09-06 LAB — CBC
HCT: 28.1 % — ABNORMAL LOW (ref 36.0–46.0)
Hemoglobin: 8.4 g/dL — ABNORMAL LOW (ref 12.0–15.0)
MCH: 27.2 pg (ref 26.0–34.0)
MCHC: 29.9 g/dL — ABNORMAL LOW (ref 30.0–36.0)
MCV: 90.9 fL (ref 80.0–100.0)
Platelets: 609 10*3/uL — ABNORMAL HIGH (ref 150–400)
RBC: 3.09 MIL/uL — ABNORMAL LOW (ref 3.87–5.11)
RDW: 15 % (ref 11.5–15.5)
WBC: 10.8 10*3/uL — ABNORMAL HIGH (ref 4.0–10.5)
nRBC: 0 % (ref 0.0–0.2)

## 2022-09-06 LAB — PHOSPHORUS: Phosphorus: 3.7 mg/dL (ref 2.5–4.6)

## 2022-09-06 LAB — MAGNESIUM: Magnesium: 2 mg/dL (ref 1.7–2.4)

## 2022-09-06 MED ORDER — ALBUMIN HUMAN 25 % IV SOLN
12.5000 g | Freq: Once | INTRAVENOUS | Status: AC
  Administered 2022-09-06: 12.5 g via INTRAVENOUS
  Filled 2022-09-06: qty 50

## 2022-09-06 MED ORDER — FUROSEMIDE 10 MG/ML IJ SOLN
40.0000 mg | Freq: Once | INTRAMUSCULAR | Status: AC
  Administered 2022-09-06: 40 mg via INTRAVENOUS
  Filled 2022-09-06: qty 4

## 2022-09-06 NOTE — Progress Notes (Addendum)
NAME:  Nicole Graves, MRN:  [id number], DOB:  [birth year], LOS: 7 ADMISSION DATE:  08/29/2022, CONSULTATION DATE:  09/05/2022 REFERRING MD: Idelle Leech , CHIEF COMPLAINT:  Pleural effusions not responsive to diuresis   History of Present Illness:  Nicole Graves is a 77 year old woman with a history of aortic stenosis status post TAVR, tricuspid regurgitation, SVT, status post ablation, CKD 3B, hypertension who presented 08/29/2022 with shortness of breath that has worsened over the last week.  She was in the hospital one  week prior and received diuresis but was not sent home on diuretics due to orthostatic hypotension.  She had right-sided thoracentesis with pseudo exudative effusion.  In the emergency department she had a chest x-ray demonstrating bilateral pleural effusions and pulmonary critical care was consulted for evaluation.  She denies a history of malignancy or tobacco use.  She has never been on outpatient diuretics before.  She notes that she had a fall a few weeks ago but this was after turning her head only.  She has learned to sit up and stand up more slowly to prevent significant symptoms  PCCM were consulted 08/30/2022 for thoracentesis. At that time Dr. Chestine Spore suggested diuresis. Pt. Has been diuresed for 6 days. She is on [employer] . She does still have exertional dyspnea. She is net - 70 cc per I&O, I am unsure if this is accurate.CXR  shows moderate bilateral pleural effusions with associated atelectasis/airspace disease. PCCM have been re-consulted to consider thoracentesis.    Pertinent  Medical History  Aortic stenosis status post TAVR in 2023 Orthostatic hypotension Hypertension Hyperlipidemia Tricuspid regurgitation  Significant Hospital Events: Including procedures, antibiotic start and stop dates in addition to other pertinent events   Admitted 7/15 Initial PCCM consult 7/16>> recommended diuresis PCCM reconsulted 7/22 for possible consult  Interim History / Subjective:  Continues  to be in negative intake and output Outlier weight has been corrected Able to ambulate without difficulty  Objective   Blood pressure 119/85, pulse 85, temperature (!) 97.1 F (36.2 C), temperature source Axillary, resp. rate 18, height 5\' 4"  (1.626 m), weight 77.6 kg, SpO2 96%.        Intake/Output Summary (Last 24 hours) at 09/06/2022 1010 Last data filed at 09/06/2022 0434 Gross per 24 hour  Intake 240 ml  Output 900 ml  Net -660 ml   Filed Weights   09/04/22 0432 09/05/22 0504 09/06/22 0428  Weight: 78.7 kg 83.5 kg 77.6 kg    Examination: Elderly female is able to walk to the bathroom and walked up and down the halls without oxygen No JVD or lymphadenopathy is appreciated Heart sounds are irregular Decreased breath sounds at the bases without wheezing Abdomen soft nontender Remedies with some dependent edema Note negative intake and output and weight trends  Resolved Hospital Problem list     Assessment & Plan:  Bilateral pleural effusions , previously  drained right pseudo exudative effusion 08/19/2022 for 650 cc pleural fluid with white fibrinous clots  , possibly consistent with diuresis prior. Diuresis x 6 days per pulmonary recommendation on 7/16 With Hx of othostatic hypotension Will likely need  midodrine as a chronic med to help tolerate a home diuretic regimen  Net negative 70 cc's on Lasix 40 daily Weigh gain today to 83.5 kg Continued moderate pleural effusions on imaging and DOE Pt. On RA with sats of 95% Note she fell approximately 4 weeks ago landing on her right side. Plan Continue diuresis she is a -1140 cc for  to 48 hours Questionable adding Diamox Strict intake and output Continue midodrine Serial chest x-rays Questionable repeat thoracentesis she has had increasing shortness of breath going on for years status post TEVAR and 1 would go on vacation with family she will require wheelchair. Magnesium is normal No chest x-ray has been ordered for  today 09/06/2022 Not sure if thoracentesis would improve her dyspnea    Best Practice (right click and "Reselect all SmartList Selections" daily)  All best practice per Primary Team Last date of multidisciplinary goals of care discussion [Pt. Updated at bedside 7/22 regarding pulmonary plan  Labs   CBC: Recent Labs  Lab 09/01/22 2345 09/04/22 0134 09/06/22 0147  WBC 11.4* 11.3* 10.8*  NEUTROABS 7.4  --   --   HGB 8.1* 9.0* 8.4*  HCT 26.3* 29.7* 28.1*  MCV 93.3 92.0 90.9  PLT 624* 628* 609*    Basic Metabolic Panel: Recent Labs  Lab 09/01/22 0043 09/01/22 2345 09/04/22 0134 09/05/22 0600 09/05/22 0601 09/06/22 0147  NA 135 136 134* 133*  --  135  K 3.4* 4.6 4.0 4.1  --  3.5  CL 96* 102 96* 98  --  95*  CO2 27 26 25 25   --  27  GLUCOSE 98 98 110* 89  --  109*  BUN 33* 28* 30* 23  --  29*  CREATININE 1.52* 1.21* 1.32* 1.13* 1.14* 1.40*  CALCIUM 8.8* 8.7* 9.0 8.4*  --  8.7*  MG 1.9 2.0 2.0 2.2  --  2.0  PHOS  --  2.7 3.6  --   --  3.7   GFR: Estimated Creatinine Clearance: 32.9 mL/min (A) (by C-G formula based on SCr of 1.4 mg/dL (H)). Recent Labs  Lab 09/01/22 2345 09/04/22 0134 09/06/22 0147  WBC 11.4* 11.3* 10.8*    Liver Function Tests: Recent Labs  Lab 09/01/22 2345  AST 13*  ALT 10  ALKPHOS 64  BILITOT 0.1*  PROT 5.7*  ALBUMIN 2.3*   No results for input(s): "LIPASE", "AMYLASE" in the last 168 hours. No results for input(s): "AMMONIA" in the last 168 hours.  ABG    Component Value Date/Time   PHART 7.402 03/12/2021 1107   PCO2ART 41.5 03/12/2021 1107   PO2ART 98.0 03/12/2021 1107   HCO3 25.3 03/12/2021 1107   TCO2 26 03/16/2021 1041   O2SAT 97.6 03/12/2021 1107     Coagulation Profile: No results for input(s): "INR", "PROTIME" in the last 168 hours.  Cardiac Enzymes: No results for input(s): "CKTOTAL", "CKMB", "CKMBINDEX", "TROPONINI" in the last 168 hours.  HbA1C: Hgb A1c MFr Bld  Date/Time Value Ref Range Status  09/23/2021  04:55 AM 4.9 4.8 - 5.6 % Final    Comment:    (NOTE) Pre diabetes:          5.7%-6.4%  Diabetes:              >6.4%  Glycemic control for   <7.0% adults with diabetes     CBG: No results for input(s): "GLUCAP" in the last 168 hours.   Brett Canales Minor ACNP Acute Care Nurse Practitioner [facility] Pulmonary/Critical Care Please consult Amion 09/06/2022, 10:12 AM  Critical care attending attestation note:   Patient seen and examined and relevant ancillary tests reviewed.  I agree with the assessment and plan of care as outlined by Devra Dopp NP.    77 year old presenting by pulmonary team with bilateral pleural effusions related to heart failure reconsulted for bilateral pleural effusions.   Persistent slight improvement  left greater than right effusions.  Creatinine stable.  Chloride dropping. Continue diuresis. Reconsider thoracentesis 7/24.   Synopsis of assessment and plan:   Bilateral pleural effusions: In the setting of clinical congestive heart failure exacerbation.  LE edema present. Would recommend ongoing more aggressive diuresis as tolerated.  Third spacing worse in the setting of hypoalbuminemia. -- Lasix IV twice daily as blood pressure and kidney function allows, IV albumin x 1 -- Chest x-ray 7/24, Reconsider repeat thoracentesis 7/25 -- Recommend daily labs with aggressive diuresis   CRITICAL CARE N/a   Vilma Meckel, MD  ICU Physician [facility] [place] [facility]   See Amion for pager info

## 2022-09-06 NOTE — Progress Notes (Signed)
Occupational Therapy Treatment Patient Details Name: Nicole Graves MRN: [id number] DOB: 1945-06-16 Today's Date: 09/06/2022   History of present illness Nicole Graves is a 77 y.o. female who presents to the ED sent from her primary care provider due to recurrent bilateral pleural effusions left greater than right seen on chest x-ray done at PCPs office. PMH significant for SVT status post ablation on 06/27/2022, severe AS status post TAVR in 2023, chronic HFpEF, CKD stage IIIa, hyperlipidemia, chronic anxiety/depression, recently admitted from 08/19/2022 through 08/22/2022 for bilateral pleural effusions, status post right thoracentesis.   OT comments  Pt showing very good progress with ADLs as evidenced by an increased score on 6-Clicks AM-PAC measure of occupational Performance with previous score of 19/24 on 7/18, and current score of 21/24. Patient remains limited by orthostatic hypotension with associated dizziness and "legs feel like jello", continued SHOB on room air, generalized weakness and decreased activity tolerance along with deficits noted below. Due to these deficits pt was unable to tolerate ambulation which she had requested, but did perform prolonged standing at bedside to assess Bps and performed therapeutic exercises at EOB.   Pt continues to demonstrate good rehab potential and would benefit from continued skilled OT to increase safety and independence with ADLs and functional transfers to allow pt to return home safely and reduce caregiver burden and fall risk.  Bps: EOB, pt reported feeling mildly dizzy. 107/69. 1st stand: 84/57 with pt symptomatic.  2nd stand after EOB exercises: 96/50, HR: 99. Pt reported legs feeling very shaky and fatigued.  Return to supine: 134/78, HR: 91, pt felt no remaining symptoms.   Pt was encouraged to sit EOB if without symptoms or to raise HOB up ad lib to allow her body to acclimate to upright posture and pt agreeable.      Recommendations for  follow up therapy are one component of a multi-disciplinary discharge planning process, led by the attending physician.  Recommendations may be updated based on patient status, additional functional criteria and insurance authorization.    Assistance Recommended at Discharge Intermittent Supervision/Assistance  Patient can return home with the following  Assistance with cooking/housework;Assist for transportation;Help with stairs or ramp for entrance;A little help with bathing/dressing/bathroom   Equipment Recommendations  Tub/shower seat    Recommendations for Other Services      Precautions / Restrictions Restrictions Weight Bearing Restrictions: No       Mobility Bed Mobility Overal bed mobility: Modified Independent                  Transfers                         Balance Overall balance assessment: Mild deficits observed, not formally tested Sitting-balance support: Feet unsupported, No upper extremity supported Sitting balance-Leahy Scale: Good     Standing balance support: During functional activity Standing balance-Leahy Scale: Good (but poor tolerance due to dizziness)                             ADL either performed or assessed with clinical judgement   ADL Overall ADL's : Needs assistance/impaired   Eating/Feeding Details (indicate cue type and reason): Pt reported aspirating on a soda and coughing violently before OT to room. Pt reports that she skipped lunch after this due to stomach upset from coughing.  Toilet Transfer Details (indicate cue type and reason): Pt asked to ambulate in hallway. Pt stood but immeidately felt dizzy.  Vitals taken with low BP; Pt asked to sit EOB. While EOB pt performed seated march in place, then bicep curls until fatigued. Pt stood a 2nd time but remained dizzy and BP still low. Pt returned to supine with BP recovering well.   Toileting - Clothing Manipulation Details  (indicate cue type and reason): Pt reports that she has been getting up and going to the bathroom on her own. Pt cautioned to have supervision due to unpredictable BP.     Functional mobility during ADLs: Supervision/safety General ADL Comments: Pt educated on energy conservatioin strategies with 2nd (different) handout provided breaking concepts down to the "$ P's" Pt very engaged in the conversation. Edcuated pt on the RPE scale to allow her to better judge fatigue levels and when to rest.    Extremity/Trunk Assessment              Vision   Vision Assessment?: No apparent visual deficits   Perception     Praxis      Cognition Arousal/Alertness: Awake/alert Behavior During Therapy: WFL for tasks assessed/performed Overall Cognitive Status: Within Functional Limits for tasks assessed                                          Exercises      Shoulder Instructions       General Comments      Pertinent Vitals/ Pain       Pain Assessment Pain Assessment: Faces Faces Pain Scale: Hurts a little bit Pain Location: Knee with standing - pt reports a recent fall on her RT side prior to admission and knee has been sore since. Pain Descriptors / Indicators: Aching, Sore Pain Intervention(s): Limited activity within patient's tolerance, Monitored during session  Home Living                                          Prior Functioning/Environment              Frequency  Min 1X/week        Progress Toward Goals  OT Goals(current goals can now be found in the care plan section)  Progress towards OT goals: Progressing toward goals  Acute Rehab OT Goals Patient Stated Goal: Easier breathing and no dizziness. OT Goal Formulation: With patient Time For Goal Achievement: 09/13/22 Potential to Achieve Goals: Good  Plan Discharge plan remains appropriate    Co-evaluation                 AM-PAC OT "6 Clicks" Daily Activity      Outcome Measure   Help from another person eating meals?: None Help from another person taking care of personal grooming?: None Help from another person toileting, which includes using toliet, bedpan, or urinal?: A Little Help from another person bathing (including washing, rinsing, drying)?: A Little Help from another person to put on and taking off regular upper body clothing?: None Help from another person to put on and taking off regular lower body clothing?: A Little 6 Click Score: 21    End of Session    OT Visit Diagnosis: Unsteadiness on feet (R26.81);Repeated falls (R29.6);Muscle weakness (generalized) (M62.81);Pain Pain -  Right/Left: Right Pain - part of body: Knee   Activity Tolerance Treatment limited secondary to medical complications (Comment) (Hypotension with standing)   Patient Left in bed;with call bell/phone within reach;with family/visitor present   Nurse Communication          Time: 1610-9604 OT Time Calculation (min): 32 min  Charges: OT General Charges $OT Visit: 1 Visit OT Treatments $Self Care/Home Management : 8-22 mins $Therapeutic Activity: 8-22 mins  Victorino Dike, OT Acute Rehab Services Office: [phone] 09/06/2022  Theodoro Clock 09/06/2022, 2:57 PM

## 2022-09-06 NOTE — Progress Notes (Signed)
PROGRESS NOTE    Nicole MARTINE  Graves:[id number] DOB: September 15, 1945 DOA: 08/29/2022 PCP: [name], MD   Brief Narrative: This 77 y.o. 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  The patient endorses exertional dyspnea. The patient was started on IV lasix.  Pulmonary recommended diuresis to euvolemia.  She's been transitioned to oral diuretics.  Hospitalization complicated by AKI and SVT.  Cardiology has been consulted.   Assessment & Plan:   Principal Problem:   Pleural effusion, bilateral Active Problems:   Pleural effusion  Recurrent bilateral pleural effusions, Left more than right,  Unclear Etiology: Suspect related to congestive heart failure. Last admission, the patient had right thoracentesis. Pulmonary consulted and recommend diuresis prior to another thoracentesis (re-consult as needed). Continue IV lasix and midodrine-- watch for orthostatic vital signs. Continue IV diuresis as tolerated, Will change to oral lasix in 1-2 days. -repeating CXR with moderate L effusion, smal R effusion. Continue Incentive spirometer -TED hose, may need abdominal binder. -7/17--reconsult PCCM -> diuresis to euvolemia, continue iv lasix. -Repeat chest x-ray showed persistent moderate bilateral pleural effusion. -Pulmonology re-consulted, recommended to continue IV diuresis.   Exertional dyspnea, likely secondary to the above: Continue DuoNebs for dyspnea and wheezing. Continue IV diuresis. Mobilize as tolerated -7/17--reconsult PCCM - recommending diuresis to  euvolemia - if effusions fail to respond or enlarge despite diuresis, consider thoracocentesis.   Acute on chronic HFpEF: Recent 2D echo on 08/21/2022 which showed LVEF 60 to 65% Trace lower extremity edema PO lasix started on 7/19.  Lasix switched to IV  on 7/21  Start strict I's and O's and daily weight -Neg 6 lbs.   History of Orthostasis: Follow up orthostatics. Continue midodrine.   SVT: - SVT on tele - has hx. SVT s/p ablation in 06/27/2022. - Metoprolol resumed at lower dose,  - Cardiology consulted, no EP workup needed.   Acute Kidney Injury: - in the setting of diuresis , Lasix continued, Creatinine improving.   Hoarseness / Heartburn: Continue H2 blocker.   Chronic anxiety/depression Resume home regimen.   Iron deficiency anemia Hemoglobin is stable, resume home iron supplement No reported overt bleeding Trend H/H   Peripheral neuropathy Continue home gabapentin.   Physical debility PT OT assessment. Orthostatic/Fall precautions. -lives with daughter and grandkids in [place]    Obesity Body mass index is 30.07 kg/m.     DVT prophylaxis: Lovenox Code Status: Full code Family Communication:No family at bed side. Disposition Plan:   Status is: Inpatient Remains inpatient appropriate because: continued need for inpatient care. Anticipated dc in 1-2 days.   Consultants:  Cardiology PCCM  Procedures:  Antimicrobials:  Anti-infectives (From admission, onward)    None      Subjective: Patient was seen and examined at bed side.  Overnight events noted. Patient reports doing better, still reports having shortness of breath while walking. Repeat chest x-ray showed persistent moderate bilateral pleural effusion.  Objective: Vitals:   09/05/22 2042 09/06/22 0055 09/06/22 0428 09/06/22 0904  BP: 121/63 (!) 115/59 107/64 119/85  Pulse: 90 92 85   Resp: 18 18 18    Temp: (!) 97 F (36.1  C) (!) 97.1 F (36.2 C) (!) 97.1 F (36.2 C)   TempSrc:  Oral Oral Axillary   SpO2: 96% 95% 96%   Weight:   77.6 kg   Height:        Intake/Output Summary (Last 24 hours) at 09/06/2022 1116 Last data filed at 09/06/2022 0434 Gross per 24 hour  Intake 120 ml  Output 900 ml  Net -780 ml   Filed Weights   09/04/22 0432 09/05/22 0504 09/06/22 0428  Weight: 78.7 kg 83.5 kg 77.6 kg    Examination:  General exam: Appears comfortable, deconditioned, not in any acute distress. Respiratory system: Decreased breath sound on LLL, CTA on Right. Respiratory effort normal.  RR 15 Cardiovascular system: S1 & S2 heard, RRR. No murmurs, No pedal edema. Gastrointestinal system: Abdomen is non distended, soft and non tender. Normal bowel sounds heard. Central nervous system: Alert and oriented x 3. No focal neurological deficits. Extremities: No edema, no cyanosis, no clubbing. Skin: No rashes, lesions or ulcers Psychiatry: Judgement and insight appear normal. Mood & affect appropriate.     Data Reviewed: I have personally reviewed following labs and imaging studies  CBC: Recent Labs  Lab 09/01/22 2345 09/04/22 0134 09/06/22 0147  WBC 11.4* 11.3* 10.8*  NEUTROABS 7.4  --   --   HGB 8.1* 9.0* 8.4*  HCT 26.3* 29.7* 28.1*  MCV 93.3 92.0 90.9  PLT 624* 628* 609*   Basic Metabolic Panel: Recent Labs  Lab 09/01/22 0043 09/01/22 2345 09/04/22 0134 09/05/22 0600 09/05/22 0601 09/06/22 0147  NA 135 136 134* 133*  --  135  K 3.4* 4.6 4.0 4.1  --  3.5  CL 96* 102 96* 98  --  95*  CO2 27 26 25 25   --  27  GLUCOSE 98 98 110* 89  --  109*  BUN 33* 28* 30* 23  --  29*  CREATININE 1.52* 1.21* 1.32* 1.13* 1.14* 1.40*  CALCIUM 8.8* 8.7* 9.0 8.4*  --  8.7*  MG 1.9 2.0 2.0 2.2  --  2.0  PHOS  --  2.7 3.6  --   --  3.7   GFR: Estimated Creatinine Clearance: 32.9 mL/min (A) (by C-G formula based on SCr of 1.4 mg/dL (H)). Liver Function Tests: Recent Labs  Lab 09/01/22 2345  AST 13*  ALT 10  ALKPHOS 64  BILITOT 0.1*  PROT 5.7*  ALBUMIN 2.3*    No results for input(s): "LIPASE", "AMYLASE" in the last 168 hours. No results for input(s): "AMMONIA" in the last 168 hours. Coagulation Profile: No results for input(s): "INR", "PROTIME" in the last 168 hours. Cardiac Enzymes: No results for input(s): "CKTOTAL", "CKMB", "CKMBINDEX", "TROPONINI" in the last 168 hours. BNP (last 3 results) No results for input(s): "PROBNP" in the last 8760 hours. HbA1C: No results for input(s): "HGBA1C" in the last 72 hours. CBG: No results for input(s): "GLUCAP" in the last 168 hours. Lipid Profile: No results for input(s): "CHOL", "HDL", "LDLCALC", "TRIG", "CHOLHDL", "LDLDIRECT" in the last 72 hours. Thyroid Function Tests: No results for input(s): "TSH", "T4TOTAL", "FREET4", "T3FREE", "THYROIDAB" in the last 72 hours. Anemia Panel: No results for input(s): "VITAMINB12", "FOLATE", "FERRITIN", "TIBC", "IRON", "RETICCTPCT" in the last 72 hours. Sepsis Labs: No results for input(s): "PROCALCITON", "LATICACIDVEN" in the last 168 hours.  No results found for this or any previous visit (from the past 240 hour(s)).   Radiology Studies: DG Chest 2 View  Result Date: 09/05/2022 CLINICAL DATA:  Pleural effusion. EXAM: CHEST -  2 VIEW COMPARISON:  September 03, 2022. FINDINGS: Stable cardiomegaly. Status post transcatheter aortic valve repair. Bilateral pleural effusions are again noted, left greater than right. Bony thorax is unremarkable. IMPRESSION: Bilateral pleural effusions are again noted with probable associated bibasilar atelectasis, left greater than right. Electronically Signed   By: Lupita Raider M.D.   On: 09/05/2022 12:36    Scheduled Meds:  acetaminophen  1,000 mg Oral Q6H   aspirin EC  81 mg Oral Daily   atorvastatin  10 mg Oral QHS   buPROPion  100 mg Oral Daily   DULoxetine  90 mg Oral Daily   enoxaparin (LOVENOX) injection  40 mg Subcutaneous Q24H   ferrous sulfate  325 mg Oral Q breakfast   furosemide  40 mg Intravenous BID    gabapentin  300 mg Oral TID   metoprolol tartrate  12.5 mg Oral BID   midodrine  5 mg Oral TID WC   Continuous Infusions:   LOS: 7 days    Time spent: 35 mins    Willeen Niece, MD [facility]   If 7PM-7AM, please contact night-coverage

## 2022-09-06 NOTE — Progress Notes (Signed)
Mobility Specialist Progress Note:    09/06/22 1000  Mobility  Activity Ambulated with assistance in hallway  Level of Assistance Standby assist, set-up cues, supervision of patient - no hands on  Assistive Device None  Distance Ambulated (ft) 280 ft  Activity Response Tolerated well  Mobility Referral Yes  $Mobility charge 1 Mobility  Mobility Specialist Start Time (ACUTE ONLY) 0919  Mobility Specialist Stop Time (ACUTE ONLY) 0926  Mobility Specialist Time Calculation (min) (ACUTE ONLY) 7 min   Pt received in chair, agreeable to ambulate. Pt needed no physical assistance throughout. During ambulation pt c/o mild SOB and bilat knee pain. Returned to room w/o fault. Pt requested to go back to her recliner. All needs met.  Thompson Grayer Mobility Specialist  Please contact vis Secure Chat or  Rehab Office [phone]

## 2022-09-07 ENCOUNTER — Inpatient Hospital Stay (HOSPITAL_COMMUNITY): Payer: Medicare PPO

## 2022-09-07 DIAGNOSIS — J90 Pleural effusion, not elsewhere classified: Secondary | ICD-10-CM | POA: Diagnosis not present

## 2022-09-07 LAB — CBC
HCT: 26.2 % — ABNORMAL LOW (ref 36.0–46.0)
Hemoglobin: 8.1 g/dL — ABNORMAL LOW (ref 12.0–15.0)
MCH: 27.3 pg (ref 26.0–34.0)
MCHC: 30.9 g/dL (ref 30.0–36.0)
MCV: 88.2 fL (ref 80.0–100.0)
Platelets: 586 10*3/uL — ABNORMAL HIGH (ref 150–400)
RBC: 2.97 MIL/uL — ABNORMAL LOW (ref 3.87–5.11)
RDW: 14.7 % (ref 11.5–15.5)
WBC: 8.3 10*3/uL (ref 4.0–10.5)
nRBC: 0 % (ref 0.0–0.2)

## 2022-09-07 LAB — MAGNESIUM: Magnesium: 2.1 mg/dL (ref 1.7–2.4)

## 2022-09-07 LAB — PHOSPHORUS: Phosphorus: 4 mg/dL (ref 2.5–4.6)

## 2022-09-07 LAB — BASIC METABOLIC PANEL
Anion gap: 12 (ref 5–15)
BUN: 30 mg/dL — ABNORMAL HIGH (ref 8–23)
CO2: 27 mmol/L (ref 22–32)
Calcium: 8.6 mg/dL — ABNORMAL LOW (ref 8.9–10.3)
Chloride: 96 mmol/L — ABNORMAL LOW (ref 98–111)
Creatinine, Ser: 1.16 mg/dL — ABNORMAL HIGH (ref 0.44–1.00)
GFR, Estimated: 48 mL/min — ABNORMAL LOW (ref >60)
Glucose, Bld: 97 mg/dL (ref 70–99)
Potassium: 2.9 mmol/L — ABNORMAL LOW (ref 3.5–5.1)
Sodium: 135 mmol/L (ref 135–145)

## 2022-09-07 MED ORDER — POTASSIUM CHLORIDE 20 MEQ PO PACK
40.0000 meq | PACK | Freq: Once | ORAL | Status: AC
  Administered 2022-09-07: 40 meq via ORAL
  Filled 2022-09-07: qty 2

## 2022-09-07 MED ORDER — POTASSIUM CHLORIDE CRYS ER 20 MEQ PO TBCR
40.0000 meq | EXTENDED_RELEASE_TABLET | ORAL | Status: AC
  Administered 2022-09-07 (×2): 40 meq via ORAL
  Filled 2022-09-07 (×2): qty 2

## 2022-09-07 MED ORDER — ALBUMIN HUMAN 25 % IV SOLN
12.5000 g | Freq: Once | INTRAVENOUS | Status: AC
  Administered 2022-09-07: 12.5 g via INTRAVENOUS
  Filled 2022-09-07: qty 50

## 2022-09-07 MED ORDER — POTASSIUM CHLORIDE 20 MEQ PO PACK
40.0000 meq | PACK | Freq: Once | ORAL | Status: DC
Start: 2022-09-07 — End: 2022-09-07

## 2022-09-07 MED ORDER — FUROSEMIDE 10 MG/ML IJ SOLN
40.0000 mg | Freq: Once | INTRAMUSCULAR | Status: AC
  Administered 2022-09-07: 40 mg via INTRAVENOUS
  Filled 2022-09-07: qty 4

## 2022-09-07 NOTE — Plan of Care (Signed)
Pt educated on new medication potassium. Pt voiced understand.

## 2022-09-07 NOTE — Plan of Care (Signed)
  Problem: Education: Goal: Knowledge of General Education information will improve Description: Including pain rating scale, medication(s)/side effects and non-pharmacologic comfort measures Outcome: Progressing   Problem: Clinical Measurements: Goal: Ability to maintain clinical measurements within normal limits will improve Outcome: Progressing   Problem: Clinical Measurements: Goal: Diagnostic test results will improve Outcome: Progressing   

## 2022-09-07 NOTE — Progress Notes (Signed)
Physical Therapy Treatment Patient Details Name: Nicole Graves MRN: [id number] DOB: May 03, 1945 Today's Date: 09/07/2022   History of Present Illness Nicole Graves is a 77 y.o. female who presents to the ED sent from her primary care provider due to recurrent bilateral pleural effusions left greater than right seen on chest x-ray done at PCPs office. PMH significant for SVT status post ablation on 06/27/2022, severe AS status post TAVR in 2023, chronic HFpEF, CKD stage IIIa, hyperlipidemia, chronic anxiety/depression, recently admitted from 08/19/2022 through 08/22/2022 for bilateral pleural effusions, status post right thoracentesis.    PT Comments  Pt seated in recliner.  Obtained BP pre session as she reports bout of dizziness last session.  See below for orthostatic vitals.  Post session reports heaviness in her chest she attributes to fluid.  Informed nursing.    BP reclined- 111/70 (84) BP standing -108/69 (79) BP standing after 3 min - 126/104 (112) BP post session seated in recliner - 143/64 (87)     Assistance Recommended at Discharge Intermittent Supervision/Assistance  If plan is discharge home, recommend the following:  Can travel by private vehicle    A little help with walking and/or transfers;A little help with bathing/dressing/bathroom;Assistance with cooking/housework;Assist for transportation;Help with stairs or ramp for entrance      Equipment Recommendations       Recommendations for Other Services       Precautions / Restrictions Precautions Precautions: Fall (but not interested in using a RW) Restrictions Weight Bearing Restrictions: No     Mobility  Bed Mobility               General bed mobility comments: seated in recliner chair on arrival.    Transfers Overall transfer level: Needs assistance Equipment used: None Transfers: Sit to/from Stand Sit to Stand: Supervision           General transfer comment: cues for hand placement to and from  seated surface.  Improved eccentric load noted.    Ambulation/Gait Ambulation/Gait assistance: Supervision Gait Distance (Feet): 125 Feet Assistive device: None Gait Pattern/deviations: Step-through pattern, Decreased stride length Gait velocity: decr     General Gait Details: decreased reciprocal arm swing.  No LOB.  HR 97bpm-117bpm, reports feeling heaviness in chest at end of session BP obtained and nurse informed.   Stairs             Wheelchair Mobility     Tilt Bed    Modified Rankin (Stroke Patients Only)       Balance Overall balance assessment: Mild deficits observed, not formally tested Sitting-balance support: Feet unsupported, No upper extremity supported Sitting balance-Leahy Scale: Good       Standing balance-Leahy Scale: Good                              Cognition Arousal/Alertness: Awake/alert Behavior During Therapy: WFL for tasks assessed/performed Overall Cognitive Status: Within Functional Limits for tasks assessed                                 General Comments: anxious about HR elevating earlier today, self-limiting        Exercises      General Comments        Pertinent Vitals/Pain Pain Assessment Pain Assessment: Faces Faces Pain Scale: Hurts a little bit Pain Location: R arm from fall Pain Descriptors / Indicators: Aching,  Sore Pain Intervention(s): Monitored during session, Repositioned    Home Living                          Prior Function            PT Goals (current goals can now be found in the care plan section) Acute Rehab PT Goals Patient Stated Goal: return to independence Potential to Achieve Goals: Good Progress towards PT goals: Progressing toward goals    Frequency    Min 1X/week      PT Plan Current plan remains appropriate    Co-evaluation              AM-PAC PT "6 Clicks" Mobility   Outcome Measure                   End of Session  Equipment Utilized During Treatment: Gait belt Activity Tolerance: Patient tolerated treatment well;Patient limited by fatigue;Other (comment) (anxiety about dizziness from last session)   Nurse Communication: Mobility status PT Visit Diagnosis: Other abnormalities of gait and mobility (R26.89);Muscle weakness (generalized) (M62.81);Repeated falls (R29.6);Difficulty in walking, not elsewhere classified (R26.2)     Time: 1914-7829 PT Time Calculation (min) (ACUTE ONLY): 14 min  Charges:    $Gait Training: 8-22 mins PT General Charges $$ ACUTE PT VISIT: 1 Visit                     Nicole Graves , PTA Acute Rehabilitation Services Office [phone]    Niemah Schwebke Artis Delay 09/07/2022, 1:02 PM

## 2022-09-07 NOTE — Progress Notes (Signed)
NAME:  Nicole Graves, MRN:  [id number], DOB:  1945/06/29, LOS: 8 ADMISSION DATE:  08/29/2022, CONSULTATION DATE:  09/05/2022 REFERRING MD: Idelle Leech , CHIEF COMPLAINT:  Pleural effusions not responsive to diuresis   History of Present Illness:  Nicole Graves is a 77 year old woman with a history of aortic stenosis status post TAVR, tricuspid regurgitation, SVT, status post ablation, CKD 3B, hypertension who presented 08/29/2022 with shortness of breath that has worsened over the last week.  She was in the hospital one  week prior and received diuresis but was not sent home on diuretics due to orthostatic hypotension.  She had right-sided thoracentesis with pseudo exudative effusion.  In the emergency department she had a chest x-ray demonstrating bilateral pleural effusions and pulmonary critical care was consulted for evaluation.  She denies a history of malignancy or tobacco use.  She has never been on outpatient diuretics before.  She notes that she had a fall a few weeks ago but this was after turning her head only.  She has learned to sit up and stand up more slowly to prevent significant symptoms  PCCM were consulted 08/30/2022 for thoracentesis. At that time Dr. Chestine Spore suggested diuresis. Pt. Has been diuresed for 6 days. She is on [employer] . She does still have exertional dyspnea. She is net - 70 cc per I&O, I am unsure if this is accurate.CXR  shows moderate bilateral pleural effusions with associated atelectasis/airspace disease. PCCM have been re-consulted to consider thoracentesis.    Pertinent  Medical History  Aortic stenosis status post TAVR in 2023 Orthostatic hypotension Hypertension Hyperlipidemia Tricuspid regurgitation  Significant Hospital Events: Including procedures, antibiotic start and stop dates in addition to other pertinent events   Admitted 7/15 Initial PCCM consult 7/16>> recommended diuresis PCCM reconsulted 7/22 for possible consult  Interim History / Subjective:  Continues  to diurese well  Objective   Blood pressure 99/80, pulse 80, temperature 97.9 F (36.6 C), temperature source Oral, resp. rate 18, height 5\' 4"  (1.626 m), weight 77.9 kg, SpO2 91%.        Intake/Output Summary (Last 24 hours) at 09/07/2022 1117 Last data filed at 09/07/2022 1043 Gross per 24 hour  Intake 240 ml  Output 2200 ml  Net -1960 ml   Filed Weights   09/05/22 0504 09/06/22 0428 09/07/22 0424  Weight: 83.5 kg 77.6 kg 77.9 kg    Examination: Elderly female who reports being better No JVD lymphadenopathy is appreciated Diminished breath sounds in the bases Heart sounds are regular regular rate rhythm Abdomen soft nontender Extremities with mild edema  Resolved Hospital Problem list     Assessment & Plan:  Bilateral pleural effusions , previously  drained right pseudo exudative effusion 08/19/2022 for 650 cc pleural fluid with white fibrinous clots  , possibly consistent with diuresis prior. Diuresis x 6 days per pulmonary recommendation on 7/16 With Hx of othostatic hypotension Will likely need  midodrine as a chronic med to help tolerate a home diuretic regimen  Net negative 70 cc's on Lasix 40 daily Weigh gain today to 83.5 kg Continued moderate pleural effusions on imaging and DOE Pt. On RA with sats of 95% Note she fell approximately 4 weeks ago landing on her right side. Plan Diuresis is working her intake and output shows a -2200 cc for 24 hours Does not appear she needs a thoracentesis at this time Serial chest x-rays Pulmonary may sign off at this time.    Best Practice (right click and "Reselect all  SmartList Selections" daily)  All best practice per Primary Team Last date of multidisciplinary goals of care discussion [Pt. Updated at bedside 7/22 regarding pulmonary plan  Labs   CBC: Recent Labs  Lab 09/01/22 2345 09/04/22 0134 09/06/22 0147 09/07/22 0012  WBC 11.4* 11.3* 10.8* 8.3  NEUTROABS 7.4  --   --   --   HGB 8.1* 9.0* 8.4* 8.1*  HCT  26.3* 29.7* 28.1* 26.2*  MCV 93.3 92.0 90.9 88.2  PLT 624* 628* 609* 586*    Basic Metabolic Panel: Recent Labs  Lab 09/01/22 2345 09/04/22 0134 09/05/22 0600 09/05/22 0601 09/06/22 0147 09/07/22 0012  NA 136 134* 133*  --  135 135  K 4.6 4.0 4.1  --  3.5 2.9*  CL 102 96* 98  --  95* 96*  CO2 26 25 25   --  27 27  GLUCOSE 98 110* 89  --  109* 97  BUN 28* 30* 23  --  29* 30*  CREATININE 1.21* 1.32* 1.13* 1.14* 1.40* 1.16*  CALCIUM 8.7* 9.0 8.4*  --  8.7* 8.6*  MG 2.0 2.0 2.2  --  2.0 2.1  PHOS 2.7 3.6  --   --  3.7 4.0   GFR: Estimated Creatinine Clearance: 39.7 mL/min (A) (by C-G formula based on SCr of 1.16 mg/dL (H)). Recent Labs  Lab 09/01/22 2345 09/04/22 0134 09/06/22 0147 09/07/22 0012  WBC 11.4* 11.3* 10.8* 8.3    Liver Function Tests: Recent Labs  Lab 09/01/22 2345  AST 13*  ALT 10  ALKPHOS 64  BILITOT 0.1*  PROT 5.7*  ALBUMIN 2.3*   No results for input(s): "LIPASE", "AMYLASE" in the last 168 hours. No results for input(s): "AMMONIA" in the last 168 hours.  ABG    Component Value Date/Time   PHART 7.402 03/12/2021 1107   PCO2ART 41.5 03/12/2021 1107   PO2ART 98.0 03/12/2021 1107   HCO3 25.3 03/12/2021 1107   TCO2 26 03/16/2021 1041   O2SAT 97.6 03/12/2021 1107     Coagulation Profile: No results for input(s): "INR", "PROTIME" in the last 168 hours.  Cardiac Enzymes: No results for input(s): "CKTOTAL", "CKMB", "CKMBINDEX", "TROPONINI" in the last 168 hours.  HbA1C: Hgb A1c MFr Bld  Date/Time Value Ref Range Status  09/23/2021 04:55 AM 4.9 4.8 - 5.6 % Final    Comment:    (NOTE) Pre diabetes:          5.7%-6.4%  Diabetes:              >6.4%  Glycemic control for   <7.0% adults with diabetes     CBG: No results for input(s): "GLUCAP" in the last 168 hours.   Brett Canales Barrie Sigmund ACNP Acute Care Nurse Practitioner [facility] Please consult Amion 09/07/2022, 11:17 AM

## 2022-09-07 NOTE — Progress Notes (Addendum)
PROGRESS NOTE    Nicole Graves  NWG:[id number] DOB: 1945/09/07 DOA: 08/29/2022 PCP: [name], MD   Brief Narrative: This 77 y.o. 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  The patient endorses exertional dyspnea. The patient was started on IV lasix.  Pulmonary recommended diuresis to euvolemia.  She's been transitioned to oral diuretics.  Hospitalization complicated by AKI and SVT.  Cardiology has been consulted.   Assessment & Plan:   Principal Problem:   Pleural effusion, bilateral Active Problems:   Pleural effusion  Recurrent bilateral pleural effusions, Left more than right,  Unclear Etiology: Suspect related to congestive heart failure. Last admission, the patient had right thoracentesis. Pulmonary consulted and recommend diuresis prior to another thoracentesis (re-consult as needed). Continue IV lasix and midodrine-- watch for orthostatic vital signs. Continue IV diuresis as tolerated, Will change to oral lasix in 1-2 days. -repeating CXR with moderate L effusion, smal R effusion. Continue Incentive spirometer -TED hose, may need abdominal binder. -7/17--reconsult PCCM -> diuresis to euvolemia, continue iv lasix. -Repeat chest x-ray showed persistent moderate bilateral pleural effusion. -Pulmonology re-consulted, recommended to continue IV diuresis. -She does not seem like requiring thoracocentesis.   Exertional dyspnea, likely secondary to the above: Continue DuoNebs for dyspnea and wheezing. Continue IV diuresis. Mobilize as  tolerated -7/17--reconsult PCCM - recommending diuresis to euvolemia - if effusions fail to respond or enlarge despite diuresis, consider thoracocentesis.   Acute on chronic HFpEF: Recent 2D echo on 08/21/2022 which showed LVEF 60 to 65% Trace lower extremity edema PO lasix started on 7/19.  Lasix switched to IV on 7/21  Start strict I's and O's and daily weight -Neg 8 lbs.   History of Orthostasis: Follow up orthostatics. Continue midodrine.   SVT: - SVT on tele - has hx.of SVT s/p ablation in 06/27/2022. - Metoprolol resumed at lower dose,  - Cardiology consulted, no EP workup needed.   Acute Kidney Injury: - in the setting of diuresis , Lasix continued, Creatinine improving.   Hoarseness / Heartburn: Continue H2 blocker.   Chronic anxiety/depression Resume home regimen.   Iron deficiency anemia Hemoglobin is stable, resume home iron supplement No reported overt bleeding Trend H/H   Peripheral neuropathy Continue home gabapentin.   Physical debility PT OT assessment. Orthostatic/Fall precautions. -lives with daughter and grandkids in [place]    Obesity Body mass index is 30.07 kg/m.   Hypokalemia: Replaced.  Continue to monitor  DVT prophylaxis: Lovenox Code Status:  Full code Family Communication: No family at bed side. Disposition Plan:   Status is: Inpatient Remains inpatient appropriate because: continued need for inpatient care. Anticipated dc in 1-2 days.   Consultants:  Cardiology PCCM  Procedures:  Antimicrobials:  Anti-infectives (From admission, onward)    None      Subjective: Patient was seen and examined at bed side.  Overnight events noted. Patient reports doing much better, she denies any chest pain or shortness of breath. Repeat chest x-ray showed persistent moderate bilateral pleural effusion.  Objective: Vitals:   09/06/22 1955 09/06/22 2349 09/07/22 0424 09/07/22 1102  BP: 115/67 117/64 99/80 111/70  Pulse: 88 73  80  97  Resp: 18 17 18 18   Temp: (!) 97.3 F (36.3 C) 97.7 F (36.5 C) 97.9 F (36.6 C) 97.9 F (36.6 C)  TempSrc: Oral Oral Oral Oral  SpO2: 95% 95% 91% 95%  Weight:   77.9 kg   Height:        Intake/Output Summary (Last 24 hours) at 09/07/2022 1406 Last data filed at 09/07/2022 1043 Gross per 24 hour  Intake 240 ml  Output 2200 ml  Net -1960 ml   Filed Weights   09/05/22 0504 09/06/22 0428 09/07/22 0424  Weight: 83.5 kg 77.6 kg 77.9 kg    Examination:  General exam: Appears comfortable, deconditioned, not in any acute distress. Respiratory system: Decreased breath sounds on LLL, CTA on Right. Respiratory effort normal.  RR 15 Cardiovascular system: S1 & S2 heard, RRR. No murmurs, No pedal edema. Gastrointestinal system: Abdomen is non distended, soft and non tender. Normal bowel sounds heard. Central nervous system: Alert and oriented x 3. No focal neurological deficits. Extremities: No edema, no cyanosis, no clubbing. Skin: No rashes, lesions or ulcers Psychiatry: Judgement and insight appear normal. Mood & affect appropriate.     Data Reviewed: I have personally reviewed following labs and imaging studies  CBC: Recent Labs  Lab 09/01/22 2345 09/04/22 0134 09/06/22 0147 09/07/22 0012  WBC 11.4* 11.3* 10.8* 8.3  NEUTROABS 7.4  --   --   --   HGB 8.1* 9.0* 8.4* 8.1*  HCT 26.3* 29.7* 28.1* 26.2*  MCV 93.3 92.0 90.9 88.2  PLT 624* 628* 609* 586*   Basic Metabolic Panel: Recent Labs  Lab 09/01/22 2345 09/04/22 0134 09/05/22 0600 09/05/22 0601 09/06/22 0147 09/07/22 0012  NA 136 134* 133*  --  135 135  K 4.6 4.0 4.1  --  3.5 2.9*  CL 102 96* 98  --  95* 96*  CO2 26 25 25   --  27 27  GLUCOSE 98 110* 89  --  109* 97  BUN 28* 30* 23  --  29* 30*  CREATININE 1.21* 1.32* 1.13* 1.14* 1.40* 1.16*  CALCIUM 8.7* 9.0 8.4*  --  8.7* 8.6*  MG 2.0 2.0 2.2  --  2.0 2.1  PHOS 2.7 3.6  --   --  3.7 4.0   GFR: Estimated Creatinine Clearance: 39.7 mL/min (A) (by C-G  formula based on SCr of 1.16 mg/dL (H)). Liver Function Tests: Recent Labs  Lab 09/01/22 2345  AST 13*  ALT 10  ALKPHOS 64  BILITOT 0.1*  PROT 5.7*  ALBUMIN 2.3*   No results for input(s): "LIPASE", "AMYLASE" in the last 168 hours. No results for input(s): "AMMONIA" in the last 168 hours. Coagulation Profile: No results for input(s): "INR", "PROTIME" in the last 168 hours. Cardiac Enzymes: No results for input(s): "CKTOTAL", "CKMB", "CKMBINDEX", "TROPONINI" in the last 168 hours. BNP (last 3 results) No results for input(s): "PROBNP" in the last 8760 hours. HbA1C: No results for input(s): "HGBA1C" in the last 72 hours. CBG: No results for input(s): "GLUCAP" in the last 168 hours. Lipid Profile: No results for input(s): "CHOL", "HDL", "LDLCALC", "TRIG", "CHOLHDL", "LDLDIRECT" in the last 72 hours. Thyroid Function Tests: No results for input(s): "TSH", "T4TOTAL", "FREET4", "T3FREE", "THYROIDAB" in the last 72 hours. Anemia Panel: No results for input(s): "VITAMINB12", "FOLATE", "FERRITIN", "TIBC", "IRON", "RETICCTPCT" in the last 72 hours. Sepsis Labs: No results for input(s): "PROCALCITON", "LATICACIDVEN" in the last 168 hours.  No results found for this or any previous visit (from the  past 240 hour(s)).   Radiology Studies: DG CHEST PORT 1 VIEW  Result Date: 09/07/2022 CLINICAL DATA:  Pleural effusion. EXAM: PORTABLE CHEST 1 VIEW COMPARISON:  09/06/2022. FINDINGS: Unchanged small left pleural effusion and left basilar atelectasis. Stable cardiac and mediastinal contours with TAVR in place. No new airspace disease or pneumothorax. IMPRESSION: Unchanged small left pleural effusion and left basilar atelectasis. Electronically Signed   By: Orvan Falconer M.D.   On: 09/07/2022 08:29   DG CHEST PORT 1 VIEW  Result Date: 09/06/2022 CLINICAL DATA:  Pleural effusion EXAM: PORTABLE CHEST 1 VIEW COMPARISON:  Chest x-ray dated September 05, 2022 FINDINGS: Unchanged cardiomegaly. Large  hiatal hernia. Prior transcatheter aortic valve replacement. Small left-greater-than-right pleural effusions and bibasilar atelectasis. No evidence of pneumothorax. IMPRESSION: 1. Small left-greater-than-right pleural effusions and bibasilar atelectasis, similar to prior. 2. Large hiatal hernia. Electronically Signed   By: Allegra Lai M.D.   On: 09/06/2022 14:23    Scheduled Meds:  acetaminophen  1,000 mg Oral Q6H   aspirin EC  81 mg Oral Daily   atorvastatin  10 mg Oral QHS   buPROPion  100 mg Oral Daily   DULoxetine  90 mg Oral Daily   enoxaparin (LOVENOX) injection  40 mg Subcutaneous Q24H   ferrous sulfate  325 mg Oral Q breakfast   gabapentin  300 mg Oral TID   metoprolol tartrate  12.5 mg Oral BID   midodrine  5 mg Oral TID WC   Continuous Infusions:   LOS: 8 days    Time spent: 35 mins    Willeen Niece, MD [facility]   If 7PM-7AM, please contact night-coverage

## 2022-09-08 DIAGNOSIS — J90 Pleural effusion, not elsewhere classified: Secondary | ICD-10-CM | POA: Diagnosis not present

## 2022-09-08 LAB — BASIC METABOLIC PANEL
Anion gap: 11 (ref 5–15)
BUN: 29 mg/dL — ABNORMAL HIGH (ref 8–23)
CO2: 27 mmol/L (ref 22–32)
Calcium: 8.6 mg/dL — ABNORMAL LOW (ref 8.9–10.3)
Chloride: 97 mmol/L — ABNORMAL LOW (ref 98–111)
Creatinine, Ser: 1.09 mg/dL — ABNORMAL HIGH (ref 0.44–1.00)
Potassium: 3.6 mmol/L (ref 3.5–5.1)
Sodium: 135 mmol/L (ref 135–145)

## 2022-09-08 MED ORDER — FUROSEMIDE 10 MG/ML IJ SOLN
40.0000 mg | Freq: Two times a day (BID) | INTRAMUSCULAR | Status: DC
  Administered 2022-09-08 – 2022-09-09 (×2): 40 mg via INTRAVENOUS
  Filled 2022-09-08 (×2): qty 4

## 2022-09-08 NOTE — Progress Notes (Signed)
PROGRESS NOTE    Nicole Graves  LKG:[id number] DOB: 1945-04-13 DOA: 08/29/2022 PCP: [name], MD   Brief Narrative: This 77 y.o. 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  The patient endorses exertional dyspnea. The patient was started on IV lasix.  Pulmonary recommended diuresis to euvolemia.  Hospitalization complicated by AKI and SVT.  Cardiology has been consulted.   Assessment & Plan:   Principal Problem:   Pleural effusion, bilateral Active Problems:   Pleural effusion  Recurrent bilateral pleural effusions, Left more than right,  Unclear Etiology: Suspect related to congestive heart failure. Last admission, the patient had right thoracentesis. Pulmonary consulted and recommend diuresis prior to another thoracentesis (re-consult as needed). Continue IV lasix and midodrine-- watch for orthostatic vital signs. Continue IV diuresis as tolerated, Will change to oral lasix in 1-2 days. Continue Incentive spirometer -7/17--reconsult PCCM -> diuresis to euvolemia, continue iv lasix. -Repeat chest x-ray showed persistent moderate bilateral pleural effusion. -Pulmonology re-consulted, recommended to continue IV diuresis. -She does not seem like requiring thoracocentesis.   Exertional dyspnea, likely secondary to the above: Continue DuoNebs for dyspnea and wheezing. Continue IV diuresis. Mobilize as tolerated -7/17--reconsult PCCM - recommending diuresis to euvolemia - if effusions fail to respond or enlarge despite diuresis, consider thoracocentesis.    Acute on chronic HFpEF: Recent 2D echo on 08/21/2022 which showed LVEF 60 to 65% Trace lower extremity edema PO lasix started on 7/19.  Lasix switched to IV on 7/21  Start strict I's and O's and daily weight - 8 lb negative   History of Orthostasis: Follow up orthostatics. Continue midodrine.   SVT: - SVT on tele - has hx.of SVT s/p ablation in 06/27/2022. - Metoprolol resumed at lower dose,  - Cardiology consulted, no EP workup needed.   Acute Kidney Injury: - in the setting of diuresis , Lasix continued, Creatinine improving.   Hoarseness / Heartburn: Continue H2 blocker.   Chronic anxiety/depression Resume home regimen.   Iron deficiency anemia Hemoglobin is stable, resume home iron supplement No reported overt bleeding. Trend H/H   Peripheral neuropathy Continue home gabapentin.   Physical debility PT OT assessment. Orthostatic/Fall precautions. -lives with daughter and grandkids in [place]    Obesity Body mass index is 30.07 kg/m.   Hypokalemia: Replaced.  Continue to monitor  DVT prophylaxis: Lovenox Code Status:  Full code Family Communication: No family at bed side. Disposition Plan:   Status is: Inpatient Remains inpatient appropriate because: continued need for inpatient care. Anticipated dc in 1-2 days.   Consultants:  Cardiology PCCM  Procedures:  Antimicrobials:  Anti-infectives (From admission, onward)    None      Subjective: Patient was seen and examined at bed side.  Overnight events noted. Patient reports doing much better.  She denies any chest pain and shortness of breath. Repeat chest x-ray showed persistent moderate bilateral pleural effusion. Patient is scared, states if she is discharged and has recurrence.  Objective: Vitals:   09/08/22 0012 09/08/22 0450 09/08/22 0732 09/08/22 1105  BP: 116/69 111/62  103/67  Pulse: (!) 41 86  95  Resp: 18 17 17  17  Temp: 98.3 F (36.8 C) 97.9 F (36.6 C) 98.2 F (36.8 C)  98.3 F (36.8 C)  TempSrc: Oral Oral Oral Oral  SpO2: (!) 78% 97%  98%  Weight:  77.9 kg    Height:        Intake/Output Summary (Last 24 hours) at 09/08/2022 1106 Last data filed at 09/08/2022 0453 Gross per 24 hour  Intake 240 ml  Output 1000 ml  Net -760 ml   Filed Weights   09/06/22 0428 09/07/22 0424 09/08/22 0450  Weight: 77.6 kg 77.9 kg 77.9 kg    Examination:  General exam: Appears comfortable, deconditioned, not in any acute distress. Respiratory system: Decreased breath sounds LLL, CTA on Right. Respiratory effort normal.  RR 14 Cardiovascular system: S1 & S2 heard, RRR. No murmurs, No pedal edema. Gastrointestinal system: Abdomen is non distended, soft and non tender. Normal bowel sounds heard. Central nervous system: Alert and oriented x 3. No focal neurological deficits. Extremities: No edema, no cyanosis, no clubbing. Skin: No rashes, lesions or ulcers Psychiatry: Judgement and insight appear normal. Mood & affect appropriate.     Data Reviewed: I have personally reviewed following labs and imaging studies  CBC: Recent Labs  Lab 09/01/22 2345 09/04/22 0134 09/06/22 0147 09/07/22 0012  WBC 11.4* 11.3* 10.8* 8.3  NEUTROABS 7.4  --   --   --   HGB 8.1* 9.0* 8.4* 8.1*  HCT 26.3* 29.7* 28.1* 26.2*  MCV 93.3 92.0 90.9 88.2  PLT 624* 628* 609* 586*   Basic Metabolic Panel: Recent Labs  Lab 09/01/22 2345 09/04/22 0134 09/05/22 0600 09/05/22 0601 09/06/22 0147 09/07/22 0012 09/08/22 0117  NA 136 134* 133*  --  135 135 135  K 4.6 4.0 4.1  --  3.5 2.9* 3.6  CL 102 96* 98  --  95* 96* 97*  CO2 26 25 25   --  27 27 27   GLUCOSE 98 110* 89  --  109* 97 98  BUN 28* 30* 23  --  29* 30* 29*  CREATININE 1.21* 1.32* 1.13* 1.14* 1.40* 1.16* 1.09*  CALCIUM 8.7* 9.0 8.4*  --  8.7* 8.6* 8.6*  MG 2.0 2.0 2.2  --  2.0 2.1  --   PHOS 2.7 3.6  --   --  3.7 4.0  --    GFR: Estimated Creatinine Clearance: 42.3 mL/min (A) (by C-G formula based on SCr of 1.09 mg/dL  (H)). Liver Function Tests: Recent Labs  Lab 09/01/22 2345  AST 13*  ALT 10  ALKPHOS 64  BILITOT 0.1*  PROT 5.7*  ALBUMIN 2.3*   No results for input(s): "LIPASE", "AMYLASE" in the last 168 hours. No results for input(s): "AMMONIA" in the last 168 hours. Coagulation Profile: No results for input(s): "INR", "PROTIME" in the last 168 hours. Cardiac Enzymes: No results for input(s): "CKTOTAL", "CKMB", "CKMBINDEX", "TROPONINI" in the last 168 hours. BNP (last 3 results) No results for input(s): "PROBNP" in the last 8760 hours. HbA1C: No results for input(s): "HGBA1C" in the last 72 hours. CBG: No results for input(s): "GLUCAP" in the last 168 hours. Lipid Profile: No results for input(s): "CHOL", "HDL", "LDLCALC", "TRIG", "CHOLHDL", "LDLDIRECT" in the last 72 hours. Thyroid Function Tests: No results for input(s): "TSH", "T4TOTAL", "FREET4", "T3FREE", "THYROIDAB" in the last 72 hours. Anemia Panel: No results for input(s): "VITAMINB12", "FOLATE", "FERRITIN", "TIBC", "IRON", "RETICCTPCT" in the last 72 hours. Sepsis Labs: No results for input(s): "PROCALCITON", "LATICACIDVEN" in the last 168 hours.  No results found for this  or any previous visit (from the past 240 hour(s)).   Radiology Studies: DG CHEST PORT 1 VIEW  Result Date: 09/07/2022 CLINICAL DATA:  Pleural effusion. EXAM: PORTABLE CHEST 1 VIEW COMPARISON:  09/06/2022. FINDINGS: Unchanged small left pleural effusion and left basilar atelectasis. Stable cardiac and mediastinal contours with TAVR in place. No new airspace disease or pneumothorax. IMPRESSION: Unchanged small left pleural effusion and left basilar atelectasis. Electronically Signed   By: Orvan Falconer M.D.   On: 09/07/2022 08:29    Scheduled Meds:  acetaminophen  1,000 mg Oral Q6H   aspirin EC  81 mg Oral Daily   atorvastatin  10 mg Oral QHS   buPROPion  100 mg Oral Daily   DULoxetine  90 mg Oral Daily   enoxaparin (LOVENOX) injection  40 mg Subcutaneous  Q24H   ferrous sulfate  325 mg Oral Q breakfast   gabapentin  300 mg Oral TID   metoprolol tartrate  12.5 mg Oral BID   midodrine  5 mg Oral TID WC   Continuous Infusions:   LOS: 9 days    Time spent: 35 mins    Willeen Niece, MD [facility]   If 7PM-7AM, please contact night-coverage

## 2022-09-08 NOTE — Progress Notes (Addendum)
Physical Therapy Treatment Patient Details Name: Nicole Graves MRN: [id number] DOB: 1945-06-17 Today's Date: 09/08/2022   History of Present Illness Nicole Graves is a 77 y.o. female who presents to the ED sent from her primary care provider due to recurrent bilateral pleural effusions left greater than right seen on chest x-ray done at PCPs office. PMH significant for SVT status post ablation on 06/27/2022, severe AS status post TAVR in 2023, chronic HFpEF, CKD stage IIIa, hyperlipidemia, chronic anxiety/depression, recently admitted from 08/19/2022 through 08/22/2022 for bilateral pleural effusions, status post right thoracentesis.    PT Comments  Pt seen for PT tx with pt agreeable. Pt is able to increase ambulation distances without AD on this date, but does require close supervision<>CGA as pt demonstrates impaired gait pattern (see below) as well as occasional LOB to R. Pt negotiates stairs with L rail & CGA<>min assist. Pt completes Hospital doctor; educated pt on Berg Balance Score & interpretation of score, as well as recommendation to use RW to reduce fall risk but pt continues to decline. Patient demonstrates increased fall risk as noted by score of   42/56 on Berg Balance Scale.  (<36= high risk for falls, close to 100%; 37-45 significant >80%; 46-51 moderate >50%; 52-55 lower >25%) Will continue to follow pt acutely to progress mobility & to work on balance to reduce fall risk. Max HR during session 110 bpm.    Assistance Recommended at Discharge Intermittent Supervision/Assistance  If plan is discharge home, recommend the following:  Can travel by private vehicle    A little help with walking and/or transfers;A little help with bathing/dressing/bathroom;Assistance with cooking/housework;Assist for transportation;Help with stairs or ramp for entrance      Equipment Recommendations  None recommended by PT    Recommendations for Other Services       Precautions / Restrictions  Precautions Precautions: Fall Restrictions Weight Bearing Restrictions: No     Mobility  Bed Mobility               General bed mobility comments: not tested, pt received & left sitting in recliner    Transfers Overall transfer level: Independent Equipment used: None   Sit to Stand: Independent           General transfer comment: STS from recliner & EOB with independence    Ambulation/Gait Ambulation/Gait assistance: Supervision Gait Distance (Feet): 200 Feet Assistive device: None Gait Pattern/deviations: Step-through pattern, Decreased stride length, Decreased weight shift to left, Decreased dorsiflexion - left, Decreased dorsiflexion - right, Decreased step length - left, Decreased step length - right Gait velocity: decreased     General Gait Details: decreased heel strike BLE, decreased weight shift to LLE, when pt experiences LOB (~2-3 times during session) it's to the R but pt with decreased awareness of this   Stairs Stairs: Yes Stairs assistance: Min assist, Min guard Stair Management: One rail Left, Alternating pattern Number of Stairs: 10 General stair comments: Pt reports she actually has rail on L, wall on R, so practiced stair negotiation with L rail.   Wheelchair Mobility     Tilt Bed    Modified Rankin (Stroke Patients Only)       Balance     Sitting balance-Leahy Scale: Good     Standing balance support: During functional activity, No upper extremity supported Standing balance-Leahy Scale: Fair                   Standardized Balance Assessment Standardized Balance Assessment :  Berg Balance Test Berg Balance Test Sit to Stand: Able to stand without using hands and stabilize independently Standing Unsupported: Able to stand safely 2 minutes Sitting with Back Unsupported but Feet Supported on Floor or Stool: Able to sit safely and securely 2 minutes Stand to Sit: Sits safely with minimal use of hands Transfers: Able to  transfer safely, minor use of hands Standing Unsupported with Eyes Closed: Able to stand 10 seconds safely Standing Ubsupported with Feet Together: Able to place feet together independently and stand 1 minute safely From Standing, Reach Forward with Outstretched Arm: Can reach forward >12 cm safely (5") From Standing Position, Pick up Object from Floor: Able to pick up shoe safely and easily From Standing Position, Turn to Look Behind Over each Shoulder: Looks behind from both sides and weight shifts well Turn 360 Degrees: Able to turn 360 degrees safely but slowly Standing Unsupported, Alternately Place Feet on Step/Stool: Needs assistance to keep from falling or unable to try Standing Unsupported, One Foot in Front: Loses balance while stepping or standing Standing on One Leg: Tries to lift leg/unable to hold 3 seconds but remains standing independently Total Score: 42        Cognition Arousal/Alertness: Awake/alert Behavior During Therapy: WFL for tasks assessed/performed Overall Cognitive Status: Within Functional Limits for tasks assessed                                 General Comments: Pt follows commands throughout session. PT educated pt on fall risk & recommendation of using RW, pt reports she doesn't want to fall but continues to decline use of RW.        Exercises      General Comments        Pertinent Vitals/Pain Pain Assessment Pain Assessment: 0-10 Pain Score: 6  Pain Location: R shoulder 2/2 fall prior to admission Pain Descriptors / Indicators: Discomfort, Sore Pain Intervention(s): Monitored during session    Home Living                          Prior Function            PT Goals (current goals can now be found in the care plan section) Acute Rehab PT Goals Patient Stated Goal: return to independence PT Goal Formulation: With patient Time For Goal Achievement: 09/13/22 Potential to Achieve Goals: Good Progress towards PT  goals: Progressing toward goals    Frequency    Min 1X/week      PT Plan Current plan remains appropriate    Co-evaluation              AM-PAC PT "6 Clicks" Mobility   Outcome Measure  Help needed turning from your back to your side while in a flat bed without using bedrails?: None Help needed moving from lying on your back to sitting on the side of a flat bed without using bedrails?: A Little Help needed moving to and from a bed to a chair (including a wheelchair)?: A Little Help needed standing up from a chair using your arms (e.g., wheelchair or bedside chair)?: None Help needed to walk in hospital room?: A Little Help needed climbing 3-5 steps with a railing? : A Little 6 Click Score: 20    End of Session Equipment Utilized During Treatment: Gait belt Activity Tolerance: Patient tolerated treatment well Patient left: in chair;with call bell/phone within  reach;with chair alarm set   PT Visit Diagnosis: Other abnormalities of gait and mobility (R26.89);Muscle weakness (generalized) (M62.81);Difficulty in walking, not elsewhere classified (R26.2);Unsteadiness on feet (R26.81);Repeated falls (R29.6)     Time: 6213-0865 PT Time Calculation (min) (ACUTE ONLY): 23 min  Charges:    $Therapeutic Activity: 23-37 mins PT General Charges $$ ACUTE PT VISIT: 1 Visit                     Aleda Grana, PT, DPT 09/08/22, 10:55 AM   Sandi Mariscal 09/08/2022, 10:53 AM

## 2022-09-09 DIAGNOSIS — J90 Pleural effusion, not elsewhere classified: Secondary | ICD-10-CM | POA: Diagnosis not present

## 2022-09-09 MED ORDER — POTASSIUM CHLORIDE CRYS ER 20 MEQ PO TBCR: 20.0000 meq | EXTENDED_RELEASE_TABLET | Freq: Every day | ORAL | 0 refills | Status: DC

## 2022-09-09 MED ORDER — MIDODRINE HCL 5 MG PO TABS: 5.0000 mg | ORAL_TABLET | Freq: Three times a day (TID) | ORAL | 0 refills | Status: DC

## 2022-09-09 MED ORDER — FUROSEMIDE 40 MG PO TABS: 40.0000 mg | ORAL_TABLET | Freq: Two times a day (BID) | ORAL | 0 refills | Status: DC

## 2022-09-09 MED ORDER — METOPROLOL TARTRATE 25 MG PO TABS: 12.5000 mg | ORAL_TABLET | Freq: Two times a day (BID) | ORAL | 0 refills | Status: DC

## 2022-09-09 NOTE — Plan of Care (Signed)
  Problem: Skin Integrity: Goal: Risk for impaired skin integrity will decrease Outcome: Progressing   

## 2022-09-09 NOTE — TOC Transition Note (Signed)
Transition of Care [facility]) - CM/SW Discharge Note   Patient Details  Name: Nicole Graves MRN: [id number] Date of Birth: 1946-01-13  Transition of Care [facility]) CM/SW Contact:  [name], RN Phone Number: 09/09/2022, 2:13 PM   Clinical Narrative:    Patient is for dc today, [facility] Lasso is at the bedside to transport her home.  NCM notified Amy with [facility].    Final next level of care: Home w Home Health Services Barriers to Discharge: Continued Medical Work up   Patient Goals and CMS Choice CMS Medicare.gov Compare Post Acute Care list provided to:: Patient Choice offered to / list presented to : Patient  Discharge Placement                         Discharge Plan and Services Additional resources added to the After Visit Summary for   In-house Referral: NA Discharge Planning Services: CM Consult Post Acute Care Choice: Home Health          DME Arranged: N/A DME Agency: NA       HH Arranged: PT, OT HH Agency: [facility], [facility] Date HH Agency Contacted: 08/31/22 Time HH Agency Contacted: 1238 Representative spoke with at [facility] Agency: Amy  Social Determinants of Health (SDOH) Interventions SDOH Screenings   Food Insecurity: No Food Insecurity (08/30/2022)  Housing: Low Risk  (08/30/2022)  Transportation Needs: No Transportation Needs (08/30/2022)  Utilities: Not At Risk (08/30/2022)  Tobacco Use: Low Risk  (08/30/2022)     Readmission Risk Interventions    08/31/2022   12:13 PM  Readmission Risk Prevention Plan  Transportation Screening Complete  HRI or Home Care Consult Complete  Palliative Care Screening Not Applicable  Medication Review (RN Care Manager) Complete

## 2022-09-09 NOTE — Discharge Instructions (Signed)
Advised to follow-up with primary care physician in 1 week. Advised to follow-up with cardiology as scheduled.  Advised to take Lasix 40 mg every 12 hours for diuresis. Advised to take potassium 20 mEq daily for diuresis induced hypokalemia. Requesting PCP to repeat chest x-ray.

## 2022-09-09 NOTE — Progress Notes (Signed)
Mobility Specialist Progress Note:    09/09/22 1015  Mobility  Activity Ambulated with assistance in hallway  Level of Assistance Standby assist, set-up cues, supervision of patient - no hands on  Assistive Device None  Distance Ambulated (ft) 400 ft  Activity Response Tolerated well  Mobility Referral Yes  $Mobility charge 1 Mobility  Mobility Specialist Start Time (ACUTE ONLY) 0930  Mobility Specialist Stop Time (ACUTE ONLY) 0941  Mobility Specialist Time Calculation (min) (ACUTE ONLY) 11 min   Pt received in recliner, agreeable to ambulate. Pt needed no physical assistance throughout session. Towards EOS pt c/o mild bilat leg pain, otherwise no c/o. Pt assisted back to chair w/ call bell and personal belongings at reach. All needs met.  Thompson Grayer Mobility Specialist  Please contact vis Secure Chat or  Rehab Office [phone]

## 2022-09-09 NOTE — Progress Notes (Signed)
Approximately 1500-- Pt discharged to home. At time of discharge, pt A&O x 4 and VSS. No complaints of pain at this time. AVS discharge instructed provided by this RN to pt and pt's daughter, at bedside. All questions and concerns answered at this time. Teach-back technique utilized. All patients belongings taken with pt--pt verified. All PIVs removed. Telemetry discontinued and notified. Pt transported home with daughter in personal vehicle.

## 2022-09-09 NOTE — Plan of Care (Signed)
  Problem: Education: Goal: Knowledge of General Education information will improve Description: Including pain rating scale, medication(s)/side effects and non-pharmacologic comfort measures 09/09/2022 1424 by Mathis Fare, RN Outcome: Adequate for Discharge 09/09/2022 1424 by Mathis Fare, RN Outcome: Adequate for Discharge   Problem: Health Behavior/Discharge Planning: Goal: Ability to manage health-related needs will improve 09/09/2022 1424 by Mathis Fare, RN Outcome: Adequate for Discharge 09/09/2022 1424 by Mathis Fare, RN Outcome: Adequate for Discharge   Problem: Clinical Measurements: Goal: Ability to maintain clinical measurements within normal limits will improve 09/09/2022 1424 by Mathis Fare, RN Outcome: Adequate for Discharge 09/09/2022 1424 by Mathis Fare, RN Outcome: Adequate for Discharge Goal: Will remain free from infection 09/09/2022 1424 by Mathis Fare, RN Outcome: Adequate for Discharge 09/09/2022 1424 by Mathis Fare, RN Outcome: Adequate for Discharge Goal: Diagnostic test results will improve 09/09/2022 1424 by Mathis Fare, RN Outcome: Adequate for Discharge 09/09/2022 1424 by Mathis Fare, RN Outcome: Adequate for Discharge Goal: Respiratory complications will improve 09/09/2022 1424 by Mathis Fare, RN Outcome: Adequate for Discharge 09/09/2022 1424 by Mathis Fare, RN Outcome: Adequate for Discharge Goal: Cardiovascular complication will be avoided 09/09/2022 1424 by Mathis Fare, RN Outcome: Adequate for Discharge 09/09/2022 1424 by Mathis Fare, RN Outcome: Adequate for Discharge   Problem: Activity: Goal: Risk for activity intolerance will decrease 09/09/2022 1424 by Mathis Fare, RN Outcome: Adequate for Discharge 09/09/2022 1424 by Mathis Fare, RN Outcome: Adequate for Discharge   Problem: Nutrition: Goal: Adequate nutrition will be maintained 09/09/2022 1424 by Mathis Fare,  RN Outcome: Adequate for Discharge 09/09/2022 1424 by Mathis Fare, RN Outcome: Adequate for Discharge   Problem: Coping: Goal: Level of anxiety will decrease 09/09/2022 1424 by Mathis Fare, RN Outcome: Adequate for Discharge 09/09/2022 1424 by Mathis Fare, RN Outcome: Adequate for Discharge   Problem: Elimination: Goal: Will not experience complications related to bowel motility 09/09/2022 1424 by Mathis Fare, RN Outcome: Adequate for Discharge 09/09/2022 1424 by Mathis Fare, RN Outcome: Adequate for Discharge Goal: Will not experience complications related to urinary retention 09/09/2022 1424 by Mathis Fare, RN Outcome: Adequate for Discharge 09/09/2022 1424 by Mathis Fare, RN Outcome: Adequate for Discharge   Problem: Pain Managment: Goal: General experience of comfort will improve 09/09/2022 1424 by Mathis Fare, RN Outcome: Adequate for Discharge 09/09/2022 1424 by Mathis Fare, RN Outcome: Adequate for Discharge   Problem: Safety: Goal: Ability to remain free from injury will improve 09/09/2022 1424 by Mathis Fare, RN Outcome: Adequate for Discharge 09/09/2022 1424 by Mathis Fare, RN Outcome: Adequate for Discharge   Problem: Skin Integrity: Goal: Risk for impaired skin integrity will decrease 09/09/2022 1424 by Mathis Fare, RN Outcome: Adequate for Discharge 09/09/2022 1424 by Mathis Fare, RN Outcome: Adequate for Discharge

## 2022-09-09 NOTE — Progress Notes (Signed)
Occupational Therapy Treatment Patient Details Name: Nicole Graves MRN: [id number] DOB: 1945-07-30 Today's Date: 09/09/2022   History of present illness Nicole Graves is a 77 y.o. female who presents to the ED sent from her primary care provider due to recurrent bilateral pleural effusions left greater than right seen on chest x-ray done at PCPs office. PMH significant for SVT status post ablation on 06/27/2022, severe AS status post TAVR in 2023, chronic HFpEF, CKD stage IIIa, hyperlipidemia, chronic anxiety/depression, recently admitted from 08/19/2022 through 08/22/2022 for bilateral pleural effusions, status post right thoracentesis.   OT comments  Pt currently at supervision level for selfcare tasks and functional transfers in the room without an assistive device.  Still with some nausea in standing and "wooziness" but BP in sitting stable at 137/73 and in standing at 111/73.  Pt continues with RUE shoulder pain but is tolerating some gentle AROM/AAROM up to approximately 110-120 degrees.  Recommend continued acute care OT at this time to help continue progression of ADLs, activity tolerance, and right shoulder AROM.     Recommendations for follow up therapy are one component of a multi-disciplinary discharge planning process, led by the attending physician.  Recommendations may be updated based on patient status, additional functional criteria and insurance authorization.    Assistance Recommended at Discharge Intermittent Supervision/Assistance  Patient can return home with the following  Assistance with cooking/housework;Assist for transportation;Help with stairs or ramp for entrance;A little help with bathing/dressing/bathroom   Equipment Recommendations  Tub/shower seat       Precautions / Restrictions Precautions Precautions: Fall Restrictions Weight Bearing Restrictions: No       Mobility Bed Mobility Overal bed mobility: Modified Independent                  Transfers    Equipment used: None Transfers: Sit to/from Stand Sit to Stand: Supervision           General transfer comment: Pt supervision for mobility secondary to nausea and legs feeling wobbly.     Balance Overall balance assessment: Mild deficits observed, not formally tested Sitting-balance support: Feet unsupported, No upper extremity supported Sitting balance-Leahy Scale: Good       Standing balance-Leahy Scale: Good                             ADL either performed or assessed with clinical judgement   ADL Overall ADL's : Needs assistance/impaired                         Toilet Transfer: Supervision/safety;Ambulation;Regular Toilet (no device) Toilet Transfer Details (indicate cue type and reason): Pt pleasant throughout session.  Reports nausea in standing.  BP in sitting 137/73 and then in standing 111/73.  Limited mobility secondary to pt not feeling well once up and walking.  No LOB noted with ambulation to the doorway of the room and back without an assistive device.  Pt still with increased right shoulder posterior arm pain with attempted AAROM/AROM.  Educated on gentle AROM shoulder flexion in supine for sets of 5-10 as pt cannot relax shoulder in sitting and continues to try and compensate with hiking.  Pain more in the posterior aspect of the shoulder and radiating down the upper arm.  Will continue to assess and monitor in treatment                  Cognition Arousal/Alertness: Awake/alert Behavior  During Therapy: WFL for tasks assessed/performed Overall Cognitive Status: Within Functional Limits for tasks assessed                                                     Pertinent Vitals/ Pain       Pain Assessment Pain Assessment: Faces Faces Pain Scale: Hurts a little bit Pain Location: R shoulder 2/2 fall prior to admission Pain Descriptors / Indicators: Discomfort, Sore Pain Intervention(s): Limited activity within  patient's tolerance, Repositioned         Frequency  Min 1X/week        Progress Toward Goals  OT Goals(current goals can now be found in the care plan section)  Progress towards OT goals: Progressing toward goals  Acute Rehab OT Goals Patient Stated Goal: Pt wants to go home OT Goal Formulation: With patient Time For Goal Achievement: 09/13/22 Potential to Achieve Goals: Good  Plan Discharge plan remains appropriate       AM-PAC OT "6 Clicks" Daily Activity     Outcome Measure   Help from another person eating meals?: None Help from another person taking care of personal grooming?: None Help from another person toileting, which includes using toliet, bedpan, or urinal?: A Little Help from another person bathing (including washing, rinsing, drying)?: A Little Help from another person to put on and taking off regular upper body clothing?: None Help from another person to put on and taking off regular lower body clothing?: A Little 6 Click Score: 21    End of Session    OT Visit Diagnosis: Unsteadiness on feet (R26.81);Repeated falls (R29.6);Muscle weakness (generalized) (M62.81);Pain Pain - Right/Left: Right Pain - part of body: Shoulder   Activity Tolerance Other (comment) (Patient nauseas slightly and not feeling good once up on her feet)   Patient Left with call bell/phone within reach;with family/visitor present   Nurse Communication Mobility status        Time: 7829-5621 OT Time Calculation (min): 33 min  Charges: OT General Charges $OT Visit: 1 Visit OT Treatments $Self Care/Home Management : 8-22 mins $Therapeutic Activity: 8-22 mins  Perrin Maltese, OTR/L Acute Rehabilitation Services  Office [phone] 09/09/2022

## 2022-09-09 NOTE — Discharge Summary (Signed)
Physician Discharge Summary  Nicole Graves ZOX:[id number] DOB: May 16, 1945 DOA: 08/29/2022  PCP: [name], MD  Admit date: 08/29/2022  Discharge date: 09/09/2022  Admitted From: Home.  Disposition:  Home Health Services.  Recommendations for Outpatient Follow-up:  Follow up with PCP in 1-2 weeks. Please obtain BMP/CBC in one week. Advised to follow-up with Cardiology as scheduled.   Advised to take Lasix 40 mg every 12 hours for diuresis. Advised to take potassium 20 mEq daily for diuresis induced hypokalemia. Requesting PCP to repeat chest x-ray.  Home Health:Home PT/OT Equipment/Devices:None  Discharge Condition: Stable CODE STATUS:Full code Diet recommendation: Heart Healthy   Brief [facility] Course: This 77 y.o. 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  The patient endorses exertional dyspnea. The patient was started on IV lasix.  Pulmonary was conculted  recommended diuresis to euvolemia.  Hospitalization complicated by AKI and SVT.  Cardiology has been consulted.  Medication adjusted,  AKI resolved.  SVT improved.  Cardiology signed off.  Patient was continued on IV diuresis with Lasix 40 every 12 hours.  Patient continued to have persistent moderate pleural effusion.  Pulmonology was reconsulted and continued to recommend IV diuresis, Patient does not require thoracocentesis.  Serum sodium improved.  Patient felt better and wants to be discharged.  Patient is being discharged home on Lasix 40 mg every 12 hours and potassium chloride 20 mEq  daily.  Advised patient to follow-up with primary care physician for repeat chest x-ray.  Discharge Diagnoses:  Principal Problem:   Pleural effusion, bilateral Active Problems:   Pleural effusion  Recurrent bilateral pleural effusions, Left more than right,  Unclear Etiology: Suspect related to congestive heart failure. Last admission, the patient had right thoracentesis. Pulmonary consulted and recommend diuresis prior to another thoracentesis (re-consult as needed). Continue IV lasix and midodrine-- watch for orthostatic vital signs. Continue IV diuresis as tolerated, Will change to oral lasix in 1-2 days. Continue Incentive spirometer -7/17--reconsult PCCM -> diuresis to euvolemia, continue iv lasix. -Repeat chest x-ray showed persistent moderate bilateral pleural effusion. -Pulmonology re-consulted, recommended to continue IV diuresis. -She does not seem like requiring thoracocentesis.   Exertional dyspnea, likely secondary to the above: Continue DuoNebs for dyspnea and wheezing. Continued on IV diuresis, transitioned to oral lasix. Mobilize as tolerated -7/17--reconsult PCCM - recommending diuresis to euvolemia - if effusions fail to respond or enlarge despite diuresis, consider thoracocentesis. Dyspnea Improved.   Acute on chronic HFpEF: Recent 2D echo on 08/21/2022 which showed LVEF 60 to 65% Trace lower extremity edema. PO lasix started on 7/19.  Lasix switched to IV on 7/21  Start strict I's and O's and daily weight - 8 lb negative   History of Orthostasis: Follow up orthostatics. Continue midodrine.   SVT: - SVT on tele - has hx.of SVT s/p ablation in 06/27/2022. - Metoprolol resumed at lower dose,  - Cardiology consulted, no EP workup needed.   Acute Kidney Injury: - in the setting of diuresis , Lasix continued, Creatinine improved.   Hoarseness / Heartburn: Continue H2 blocker.   Chronic anxiety/depression Resume home regimen.   Iron deficiency  anemia Hemoglobin  is stable, resume home iron supplement No reported overt bleeding. Trend H/H   Peripheral neuropathy Continue home gabapentin.   Physical debility PT OT assessment. Orthostatic/Fall precautions. -lives with daughter and grandkids in [place]    Obesity Body mass index is 30.07 kg/m.   Hypokalemia: Replaced.  Improved.  Discharge Instructions  Discharge Instructions     Call MD for:  difficulty breathing, headache or visual disturbances   Complete by: As directed    Call MD for:  persistant dizziness or light-headedness   Complete by: As directed    Call MD for:  persistant nausea and vomiting   Complete by: As directed    Diet - low sodium heart healthy   Complete by: As directed    Diet Carb Modified   Complete by: As directed    Discharge instructions   Complete by: As directed    Advised to follow-up with primary care physician in 1 week. Advised to follow-up with cardiology as scheduled.  Advised to take Lasix 40 mg every 12 hours for diuresis. Advised to take potassium 20 mEq daily for diuresis induced hypokalemia. Requesting PCP to repeat chest x-ray.   Increase activity slowly   Complete by: As directed       Allergies as of 09/09/2022       Reactions   Sulfa Antibiotics Nausea And Vomiting, Other (See Comments)   Ativan [lorazepam] Other (See Comments)   Hyperactive    Tizanidine Hcl Other (See Comments)   Pt does not remember         Medication List     STOP taking these medications    lidocaine 5 % Commonly known as: LIDODERM   metoprolol succinate 50 MG 24 hr tablet Commonly known as: TOPROL-XL       TAKE these medications    acetaminophen 650 MG CR tablet Commonly known as: TYLENOL Take 1,300 mg by mouth in the morning and at bedtime.   alendronate 70 MG tablet Commonly known as: FOSAMAX Take 70 mg by mouth every Thursday.   aspirin EC 81 MG tablet Take 81 mg by mouth daily. Swallow whole.    atorvastatin 10 MG tablet Commonly known as: LIPITOR Take 10 mg by mouth at bedtime.   buPROPion 100 MG tablet Commonly known as: WELLBUTRIN Take 100 mg by mouth daily.   cyclobenzaprine 10 MG tablet Commonly known as: FLEXERIL Take 1 tablet (10 mg total) by mouth at bedtime.   DULoxetine 30 MG capsule Commonly known as: CYMBALTA Take 90 mg by mouth daily after breakfast.   famotidine 20 MG tablet Commonly known as: PEPCID Take 20 mg by mouth daily as needed for heartburn.   ferrous sulfate 325 (65 FE) MG tablet Take 325 mg by mouth daily.   furosemide 40 MG tablet Commonly known as: Lasix Take 1 tablet (40 mg total) by mouth 2 (two) times daily.   gabapentin 300 MG capsule Commonly known as: NEURONTIN Take 300 mg by mouth 3 (three) times daily.   Melatonin 5 MG Caps Take 1 capsule by mouth at bedtime.   metoprolol tartrate 25 MG tablet Commonly known as: LOPRESSOR Take 0.5 tablets (12.5 mg total) by mouth 2 (two) times daily.   midodrine 5 MG tablet Commonly known as: PROAMATINE Take 1 tablet (5 mg total) by mouth 3 (three) times daily with meals.   potassium chloride SA 20 MEQ tablet Commonly known as: KLOR-CON M Take 1 tablet (20 mEq total) by mouth daily.  Follow-up Information     [name], MD. Go on 09/23/2022.   Specialty: Family Medicine Why: @11 :Legrand Pitts information: [address] [place] [address] [phone]         Marinus Maw, MD Follow up in 1 week(s).   Specialty: Cardiology Contact information: [address] [place] [address] [phone]         [facility] Follow up.   Why: Agency will call you to set up apt times Contact information: [address] [place] [address] [phone]                Allergies  Allergen Reactions   Sulfa Antibiotics Nausea And Vomiting and Other (See Comments)   Ativan [Lorazepam] Other (See Comments)     Hyperactive    Tizanidine Hcl Other (See Comments)    Pt does not remember     Consultations: Cardiology Pulmonology   Procedures/Studies: DG CHEST PORT 1 VIEW  Result Date: 09/07/2022 CLINICAL DATA:  Pleural effusion. EXAM: PORTABLE CHEST 1 VIEW COMPARISON:  09/06/2022. FINDINGS: Unchanged small left pleural effusion and left basilar atelectasis. Stable cardiac and mediastinal contours with TAVR in place. No new airspace disease or pneumothorax. IMPRESSION: Unchanged small left pleural effusion and left basilar atelectasis. Electronically Signed   By: Orvan Falconer M.D.   On: 09/07/2022 08:29   DG CHEST PORT 1 VIEW  Result Date: 09/06/2022 CLINICAL DATA:  Pleural effusion EXAM: PORTABLE CHEST 1 VIEW COMPARISON:  Chest x-ray dated September 05, 2022 FINDINGS: Unchanged cardiomegaly. Large hiatal hernia. Prior transcatheter aortic valve replacement. Small left-greater-than-right pleural effusions and bibasilar atelectasis. No evidence of pneumothorax. IMPRESSION: 1. Small left-greater-than-right pleural effusions and bibasilar atelectasis, similar to prior. 2. Large hiatal hernia. Electronically Signed   By: Allegra Lai M.D.   On: 09/06/2022 14:23   DG Chest 2 View  Result Date: 09/05/2022 CLINICAL DATA:  Pleural effusion. EXAM: CHEST - 2 VIEW COMPARISON:  September 03, 2022. FINDINGS: Stable cardiomegaly. Status post transcatheter aortic valve repair. Bilateral pleural effusions are again noted, left greater than right. Bony thorax is unremarkable. IMPRESSION: Bilateral pleural effusions are again noted with probable associated bibasilar atelectasis, left greater than right. Electronically Signed   By: Lupita Raider M.D.   On: 09/05/2022 12:36   DG Chest 2 View  Result Date: 09/03/2022 CLINICAL DATA:  Pleural effusion. EXAM: CHEST - 2 VIEW COMPARISON:  Chest radiograph dated 08/31/2022. FINDINGS: The heart is enlarged. Moderate bilateral pleural effusions with associated  atelectasis/airspace disease appear unchanged on the left and increased on the right compared to 08/31/2022. No pneumothorax. Degenerative changes are seen in the spine. IMPRESSION: Moderate bilateral pleural effusions with associated atelectasis/airspace disease, increased on the right compared to 08/31/2022. Electronically Signed   By: Romona Curls M.D.   On: 09/03/2022 15:32   DG CHEST PORT 1 VIEW  Result Date: 08/31/2022 CLINICAL DATA:  Recurrent left pleural effusion. EXAM: PORTABLE CHEST 1 VIEW COMPARISON:  08/29/2022 FINDINGS: Stable cardiomediastinal contours. Status post TAVR. Moderate left pleural effusion is stable to mildly decreased in volume from previous exam. Small right pleural effusion is unchanged. Decreased aeration within the lung bases compatible with atelectasis. Degenerative changes identified within the glenohumeral joints, left greater than right. IMPRESSION: 1. Moderate left pleural effusion is stable to mildly decreased in volume from previous exam. 2. Small right pleural effusion is unchanged. 3. Bibasilar atelectasis. Electronically Signed   By: Veronda Prude.D.  On: 08/31/2022 15:44   DG Chest 2 View  Result Date: 08/29/2022 CLINICAL DATA:  Shortness of breath. EXAM: CHEST - 2 VIEW COMPARISON:  Same day. FINDINGS: Stable cardiomegaly. Status post transcatheter aortic valve repair. Bilateral pleural effusions are noted with associated bibasilar atelectasis, left greater than right. Degenerative changes seen involving the left glenohumeral joint. IMPRESSION: Bilateral pleural effusions are noted with associated bibasilar atelectasis, left greater than right. Electronically Signed   By: Lupita Raider M.D.   On: 08/29/2022 18:16   DG CHEST PORT 1 VIEW  Result Date: 08/22/2022 CLINICAL DATA:  142230 Pleural effusion 142230 EXAM: PORTABLE CHEST 1 VIEW COMPARISON:  CXR 08/20/22 FINDINGS: Postprocedural changes from TAVR. Small bilateral pleural effusions, unchanged from prior  exam. Cardiomegaly. Hazy bibasilar airspace opacities most likely represent atelectasis, but infection is not excluded. No radiographically apparent displaced rib fractures. Visualized upper is unremarkable. IMPRESSION: Small bilateral pleural effusions and superimposed hazy airspace opacities, unchanged from prior exam. Electronically Signed   By: Lorenza Cambridge M.D.   On: 08/22/2022 08:12   ECHOCARDIOGRAM COMPLETE  Result Date: 08/21/2022    ECHOCARDIOGRAM REPORT   Patient Name:   ROTONDA STANKOVIC [facility] Date of Exam: 08/21/2022 Medical Rec #:  [id number]    Height:       63.0 in Accession #:    [id number]   Weight:       177.4 lb Date of Birth:  August 08, 1945    BSA:          1.838 m Patient Age:    77 years     BP:           115/63 mmHg Patient Gender: F            HR:           99 bpm. Exam Location:  Inpatient Procedure: 2D Echo, Cardiac Doppler and Color Doppler Indications:    Dyspnea R06.00  History:        Patient has prior history of Echocardiogram examinations, most                 recent 03/18/2022. Arrythmias:Tachycardia; Risk                 Factors:Dyslipidemia and Diabetes. CKD, stage 3.                 Aortic Valve: 29 mm CoreValve-Evolut Pro prosthetic, stented                 (TAVR) valve is present in the aortic position. Procedure Date:                 03/16/21.  Sonographer:    Lucendia Herrlich Referring Phys: [id number] VISHAL R PATEL IMPRESSIONS  1. Left ventricular ejection fraction, by estimation, is 60 to 65%. The left ventricle has normal function. The left ventricle has no regional wall motion abnormalities. Left ventricular diastolic parameters are consistent with Grade I diastolic dysfunction (impaired relaxation).  2. Right ventricular systolic function is normal. The right ventricular size is normal. There is mildly elevated pulmonary artery systolic pressure. The estimated right ventricular systolic pressure is 42.7 mmHg.  3. The mitral valve is degenerative. Trivial mitral valve regurgitation.  4. The  aortic valve has been repaired/replaced. Aortic valve regurgitation is not visualized. There is a 29 mm CoreValve-Evolut Pro prosthetic (TAVR) valve present in the aortic position. Procedure Date: 03/16/21.  5. The inferior vena cava is normal in size with greater than 50% respiratory variability,  suggesting right atrial pressure of 3 mmHg. Comparison(s): Prior images reviewed side by side. LVEF remains normal at 60-65%. TAVR stable with peak gradient 13 mmHg and no paravalvular regurgitation. FINDINGS  Left Ventricle: Left ventricular ejection fraction, by estimation, is 60 to 65%. The left ventricle has normal function. The left ventricle has no regional wall motion abnormalities. The left ventricular internal cavity size was normal in size. There is  no left ventricular hypertrophy. Left ventricular diastolic parameters are consistent with Grade I diastolic dysfunction (impaired relaxation). Right Ventricle: The right ventricular size is normal. No increase in right ventricular wall thickness. Right ventricular systolic function is normal. There is mildly elevated pulmonary artery systolic pressure. The tricuspid regurgitant velocity is 3.15  m/s, and with an assumed right atrial pressure of 3 mmHg, the estimated right ventricular systolic pressure is 42.7 mmHg. Left Atrium: Left atrial size was normal in size. Right Atrium: Right atrial size was normal in size. Pericardium: There is no evidence of pericardial effusion. Presence of epicardial fat layer. Mitral Valve: The mitral valve is degenerative in appearance. There is mild calcification of the mitral valve leaflet(s). Trivial mitral valve regurgitation. Tricuspid Valve: The tricuspid valve is grossly normal. Tricuspid valve regurgitation is mild. Aortic Valve: The aortic valve has been repaired/replaced. Aortic valve regurgitation is not visualized. Aortic valve peak gradient measures 13.2 mmHg. There is a 29 mm CoreValve-Evolut Pro prosthetic, stented (TAVR)  valve present in the aortic position.  Procedure Date: 03/16/21. Pulmonic Valve: The pulmonic valve was not well visualized. Pulmonic valve regurgitation is trivial. Aorta: The aortic root is normal in size and structure. Venous: The inferior vena cava is normal in size with greater than 50% respiratory variability, suggesting right atrial pressure of 3 mmHg. IAS/Shunts: No atrial level shunt detected by color flow Doppler.  LEFT VENTRICLE PLAX 2D LVIDd:         4.30 cm   Diastology LVIDs:         2.80 cm   LV e' medial:    10.70 cm/s LV PW:         0.70 cm   LV E/e' medial:  10.6 LV IVS:        1.00 cm   LV e' lateral:   9.64 cm/s LVOT diam:     2.00 cm   LV E/e' lateral: 11.7 LV SV:         60 LV SV Index:   33 LVOT Area:     3.14 cm  RIGHT VENTRICLE             IVC RV S prime:     12.20 cm/s  IVC diam: 1.70 cm TAPSE (M-mode): 1.0 cm LEFT ATRIUM             Index        RIGHT ATRIUM           Index LA diam:        3.20 cm 1.74 cm/m   RA Area:     15.00 cm LA Vol (A2C):   49.4 ml 26.88 ml/m  RA Volume:   36.00 ml  19.59 ml/m LA Vol (A4C):   44.5 ml 24.22 ml/m LA Biplane Vol: 48.7 ml 26.50 ml/m  AORTIC VALVE AV Area (Vmax): 1.88 cm AV Vmax:        182.00 cm/s AV Peak Grad:   13.2 mmHg LVOT Vmax:      108.67 cm/s LVOT Vmean:     71.000 cm/s LVOT VTI:  0.191 m  AORTA Ao Root diam: 2.30 cm Ao Asc diam:  2.20 cm MITRAL VALVE                TRICUSPID VALVE MV Area (PHT): 4.49 cm     TR Peak grad:   39.7 mmHg MV Decel Time: 169 msec     TR Vmax:        315.00 cm/s MV E velocity: 113.00 cm/s MV A velocity: 106.00 cm/s  SHUNTS MV E/A ratio:  1.07         Systemic VTI:  0.19 m                             Systemic Diam: 2.00 cm Nona Dell MD Electronically signed by Nona Dell MD Signature Date/Time: 08/21/2022/5:03:42 PM    Final    DG CHEST PORT 1 VIEW  Result Date: 08/20/2022 CLINICAL DATA:  Bilateral pleural effusion. EXAM: PORTABLE CHEST 1 VIEW COMPARISON:  Earlier today FINDINGS: Pleural and  parenchymal opacification in the bilateral lower chest, pleural fluid and atelectasis by most recent chest CT. Cardiopericardial enlargement. There is a superimposed large hiatal hernia by CT. Transcatheter aortic valve replacement. IMPRESSION: Known bilateral pleural effusion and atelectasis. Worsening aeration at the right base. Electronically Signed   By: Tiburcio Pea M.D.   On: 08/20/2022 08:16   DG Chest Port 1 View  Result Date: 08/20/2022 CLINICAL DATA:  Status post thoracentesis EXAM: PORTABLE CHEST 1 VIEW COMPARISON:  08/19/2022 FINDINGS: Moderate left pleural effusion remains. Decreasing right pleural effusion following thoracentesis. No pneumothorax. Cardiomegaly. Status post TAVR. Left lower lobe atelectasis. IMPRESSION: Decreasing right effusion and improving right base atelectasis following thoracentesis. No pneumothorax. Continued moderate left effusion with left base atelectasis. Electronically Signed   By: Charlett Nose M.D.   On: 08/20/2022 00:19   CT Angio Chest PE W and/or Wo Contrast  Result Date: 08/19/2022 CLINICAL DATA:  Trauma bilateral effusions w/ unclear etiology. Shortness of breath, chest pain. Fall 2 weeks ago. EXAM: CT ANGIOGRAPHY CHEST WITH CONTRAST TECHNIQUE: Multidetector CT imaging of the chest was performed using the standard protocol during bolus administration of intravenous contrast. Multiplanar CT image reconstructions and MIPs were obtained to evaluate the vascular anatomy. RADIATION DOSE REDUCTION: This exam was performed according to the departmental dose-optimization program which includes automated exposure control, adjustment of the mA and/or kV according to patient size and/or use of iterative reconstruction technique. CONTRAST:  55mL OMNIPAQUE IOHEXOL 350 MG/ML SOLN COMPARISON:  03/02/2021 FINDINGS: Cardiovascular: No filling defects in the pulmonary arteries to suggest pulmonary emboli. Mild cardiomegaly. Prior TAVR. Scattered coronary artery and aortic  calcifications. No aneurysm. Mediastinum/Nodes: No mediastinal, hilar, or axillary adenopathy. Trachea and esophagus are unremarkable. Thyroid unremarkable. Lungs/Pleura: Large bilateral pleural effusions. Compressive atelectasis in the lower lobes. Small pericardial effusion. Upper Abdomen: Large hiatal hernia.  No acute findings. Musculoskeletal: Chest wall soft tissues are unremarkable. No acute bony abnormality. Review of the MIP images confirms the above findings. IMPRESSION: No evidence of pulmonary embolus. Large bilateral pleural effusions with compressive atelectasis in the lower lobes. Large hiatal hernia. Scattered coronary artery disease. Aortic Atherosclerosis (ICD10-I70.0). Electronically Signed   By: Charlett Nose M.D.   On: 08/19/2022 20:13   DG Chest Portable 1 View  Result Date: 08/19/2022 CLINICAL DATA:  Chest pain, initial encounter EXAM: PORTABLE CHEST 1 VIEW COMPARISON:  08/16/2022 FINDINGS: Cardiac shadow is stable. TAVR is again noted. Small effusions are again seen right  greater than left. Mild bibasilar atelectatic changes are seen. IMPRESSION: Small effusions and bibasilar atelectasis. Electronically Signed   By: Alcide Clever M.D.   On: 08/19/2022 19:27   DG Elbow 2 Views Right  Result Date: 08/19/2022 CLINICAL DATA:  Recent fall with right elbow pain, initial encounter EXAM: RIGHT ELBOW - 2 VIEW COMPARISON:  None Available. FINDINGS: There is no evidence of fracture, dislocation, or joint effusion. There is no evidence of arthropathy or other focal bone abnormality. Soft tissues are unremarkable. IMPRESSION: No acute abnormality noted. Electronically Signed   By: Alcide Clever M.D.   On: 08/19/2022 19:26   DG Shoulder Right  Result Date: 08/19/2022 CLINICAL DATA:  Recent fall with right shoulder pain, initial encounter EXAM: RIGHT SHOULDER - 2+ VIEW COMPARISON:  None Available. FINDINGS: Degenerative changes of the acromioclavicular joint are seen. Humeral head is high-riding  related to underlying rotator cuff injury. Right-sided pleural effusion is noted. No other focal abnormality is seen. IMPRESSION: Degenerative changes of the right shoulder joint. Right pleural effusion. Electronically Signed   By: Alcide Clever M.D.   On: 08/19/2022 19:25   DG Humerus Right  Result Date: 08/19/2022 CLINICAL DATA:  Recent fall with right arm pain, initial encounter EXAM: RIGHT HUMERUS - 2+ VIEW COMPARISON:  None Available. FINDINGS: Humeral head is high-riding likely related to chronic rotator cuff injury. No acute fracture or dislocation is noted. No soft tissue abnormality is seen IMPRESSION: Chronic changes without acute abnormality. Electronically Signed   By: Alcide Clever M.D.   On: 08/19/2022 19:24   DG Chest 2 View  Result Date: 08/19/2022 CLINICAL DATA:  Fall 2 weeks ago with shortness of breath EXAM: CHEST - 2 VIEW COMPARISON:  06/11/2022 FINDINGS: Cardiac shadow is stable. TAVR is again seen. Lungs are well aerated bilaterally with small effusions posteriorly. No focal infiltrate is seen. IMPRESSION: Small effusions bilaterally. Electronically Signed   By: Alcide Clever M.D.   On: 08/19/2022 17:15     Subjective: Patient was seen and examined at bedside.  Overnight events noted.   Patient reports doing much better wants to be discharged.  Patient being discharged home.  Discharge Exam: Vitals:   09/09/22 0736 09/09/22 1131  BP: 104/67 114/75  Pulse: 84 93  Resp:    Temp: 97.6 F (36.4 C) (!) 97.3 F (36.3 C)  SpO2: 98% 99%   Vitals:   09/09/22 0016 09/09/22 0511 09/09/22 0736 09/09/22 1131  BP: 112/73 104/68 104/67 114/75  Pulse: 86 89 84 93  Resp: 18 16    Temp: (!) 97.4 F (36.3 C) 97.6 F (36.4 C) 97.6 F (36.4 C) (!) 97.3 F (36.3 C)  TempSrc: Oral Oral Oral Oral  SpO2: 96% 96% 98% 99%  Weight:  77.2 kg    Height:        General: Pt is alert, awake, not in acute distress Cardiovascular: RRR, S1/S2 +, no rubs, no gallops Respiratory: CTA  bilaterally, no wheezing, no rhonchi Abdominal: Soft, NT, ND, bowel sounds + Extremities: no edema, no cyanosis    The results of significant diagnostics from this hospitalization (including imaging, microbiology, ancillary and laboratory) are listed below for reference.     Microbiology: No results found for this or any previous visit (from the past 240 hour(s)).   Labs: BNP (last 3 results) Recent Labs    08/20/22 0020 08/29/22 1749 09/05/22 0601  BNP 302.5* 163.5* 96.8   Basic Metabolic Panel: Recent Labs  Lab 09/04/22 0134 09/05/22 0600 09/05/22  [id number] 09/06/22 0147 09/07/22 0012 09/08/22 0117  NA 134* 133*  --  135 135 135  K 4.0 4.1  --  3.5 2.9* 3.6  CL 96* 98  --  95* 96* 97*  CO2 25 25  --  27 27 27   GLUCOSE 110* 89  --  109* 97 98  BUN 30* 23  --  29* 30* 29*  CREATININE 1.32* 1.13* 1.14* 1.40* 1.16* 1.09*  CALCIUM 9.0 8.4*  --  8.7* 8.6* 8.6*  MG 2.0 2.2  --  2.0 2.1  --   PHOS 3.6  --   --  3.7 4.0  --    Liver Function Tests: No results for input(s): "AST", "ALT", "ALKPHOS", "BILITOT", "PROT", "ALBUMIN" in the last 168 hours. No results for input(s): "LIPASE", "AMYLASE" in the last 168 hours. No results for input(s): "AMMONIA" in the last 168 hours. CBC: Recent Labs  Lab 09/04/22 0134 09/06/22 0147 09/07/22 0012  WBC 11.3* 10.8* 8.3  HGB 9.0* 8.4* 8.1*  HCT 29.7* 28.1* 26.2*  MCV 92.0 90.9 88.2  PLT 628* 609* 586*   Cardiac Enzymes: No results for input(s): "CKTOTAL", "CKMB", "CKMBINDEX", "TROPONINI" in the last 168 hours. BNP: Invalid input(s): "POCBNP" CBG: No results for input(s): "GLUCAP" in the last 168 hours. D-Dimer No results for input(s): "DDIMER" in the last 72 hours. Hgb A1c No results for input(s): "HGBA1C" in the last 72 hours. Lipid Profile No results for input(s): "CHOL", "HDL", "LDLCALC", "TRIG", "CHOLHDL", "LDLDIRECT" in the last 72 hours. Thyroid function studies No results for input(s): "TSH", "T4TOTAL", "T3FREE",  "THYROIDAB" in the last 72 hours.  Invalid input(s): "FREET3" Anemia work up No results for input(s): "VITAMINB12", "FOLATE", "FERRITIN", "TIBC", "IRON", "RETICCTPCT" in the last 72 hours. Urinalysis    Component Value Date/Time   COLORURINE YELLOW 09/22/2021 1716   APPEARANCEUR CLEAR 09/22/2021 1716   LABSPEC 1.008 09/22/2021 1716   PHURINE 5.0 09/22/2021 1716   GLUCOSEU NEGATIVE 09/22/2021 1716   HGBUR NEGATIVE 09/22/2021 1716   BILIRUBINUR NEGATIVE 09/22/2021 1716   KETONESUR NEGATIVE 09/22/2021 1716   PROTEINUR NEGATIVE 09/22/2021 1716   UROBILINOGEN 0.2 10/08/2008 1021   NITRITE NEGATIVE 09/22/2021 1716   LEUKOCYTESUR NEGATIVE 09/22/2021 1716   Sepsis Labs Recent Labs  Lab 09/04/22 0134 09/06/22 0147 09/07/22 0012  WBC 11.3* 10.8* 8.3   Microbiology No results found for this or any previous visit (from the past 240 hour(s)).   Time coordinating discharge: Over 30 minutes  SIGNED:   Willeen Niece, MD  [facility] 09/09/2022, 3:18 PM Pager   If 7PM-7AM, please contact night-coverage

## 2022-09-12 NOTE — Progress Notes (Unsigned)
Cardiology Office Note:  .   Date:  09/14/2022  ID:  DAWSON PENTON, DOB Dec 01, 1945, MRN [id number] PCP: [name], MD   [facility] Providers Cardiologist:  [name], MD Electrophysiologist:  Lewayne Bunting, MD    Patient Profile: .      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  She was also found to have aortic valve disease which was followed with routine echocardiogram.  PAT improved on beta-blocker therapy.  He underwent successful TAVR with 29 mm Medtronic Evolut Pro-Plus via TF approacho on 03/16/21.    She later had worsening palpitations had documented SVT at 160 bpm.  X 4 lasting for over an hour.  She was referred to EP and underwent AVNRT ablation 06/27/2022 with Dr. Ladona Ridgel.   Admission 7/5-08/22/22 who presented to ED with shortness of breath noted to have large bilateral pleural effusions with compressive atelectasis . She underwent right thoracentesis. Unknown origin of pleural effusions, consideration given to recent mechanical fall and injury to right ribs. Consider HFpEF.   Return admission 7/15-7/26/24 for recurrent bilateral pleural effusions on CXR at PCP office.  She endorsed exertional dyspnea.Thoracentesis was not required. Improved with IV Lasix but concerns for hypotension. Suspect r/t CHF. Started on midodrine for hypotension. Seen by EP during admission due to concern for SVT on monitor. Felt to be sinus tachycardia. Recommendation to continue low dose metoprolol.        History of Present Illness: .   Nicole Graves is a very pleasant 77 y.o.  female who is here today with her daughter for follow-up. She reports she is feeling well since hospital discharge. We discussed factors that may be associated with recurrence of pleural effusions.  Daughter states thoracentesis 08/19/22 revealed "old fluid" thought to be 2/2 TAVR 02/2021, while repeat occurrence was thought to be 2/2 CHF.  She fell prior to second admission and admits to poor inhalation/exhalation. Home weight has been stable. She reports prior to admission weight was in the 180s. Now base weight appears to be 171-173 lb. Has not been monitoring BP at home but denies lightheadedness, presyncope, syncope. She is not having any chest pain or shortness of breath. Did water aerobics a few days ago with no cardiac symptoms.   ROS: See HPI       Studies Reviewed: .         Risk Assessment/Calculations:             Physical Exam:   VS:  BP 120/76   Pulse 84   Ht 5\' 4"  (1.626 m)   Wt 176 lb (79.8 kg)   SpO2 95%   BMI 30.21 kg/m    Wt Readings from Last 3 Encounters:  09/14/22 176 lb (79.8 kg)  09/09/22 170 lb 4.8 oz (77.2 kg)  08/21/22 177 lb 6.4 oz (80.5 kg)    GEN: Well nourished, well developed in no acute distress NECK: No JVD; No carotid bruits CARDIAC: RRR, no murmurs, rubs, gallops RESPIRATORY:  Clear to auscultation without rales, wheezing or rhonchi  ABDOMEN: Soft, non-tender, non-distended EXTREMITIES:  No edema; No deformity     ASSESSMENT AND PLAN: .  Chronic pleural effusion: Admission x 16 August 2022 for bilateral pleural effusion, left greater than right. Thoracentesis 7/5 with improvement then return of moderate bilateral effusions. Discharged 09/09/22 with mild pleural effusion on left. Lung sounds are clear bilaterally. I do not suspect increased effusion. Has follow-up with PCP on 09/27/22.  Advised her to notify us if she has worsening symptoms prior to appointment as I would be willing to order CXR for evaluation. She will trial taking Lasix 40 mg doses  closer together to see if she has more brisk urine output.  Stable renal function on labs 09/09/2022.  She is scheduled for repeat lab work in 2 weeks.  HFpEF: LVEF 60-65%, G1DD on echo 08/21/22. Reviewed anatomy of diastolic heart failure. She lost 14 lb during admission. Home weight has been stable. She denies shortness of breath, dyspnea, orthopnea, PND, edema.  Daughter states legs were never swollen during hospitalization. She is not getting brisk urine output since hospital d/c. Is on Lasix 40 mg twice daily, but has been taking every 12 hours. I encouraged her to take second dose of Lasix 40 mg closer to the first dose.  She will need to monitor for lightheadedness as she is currently on midodrine for hypotension. Advised continue daily weight, low sodium diet < 2000 mg, limit fluids to 2 L daily, and continue regular physical activity.   Hypotension: BP is stable. She had one dose of midodrine this morning, did not take mid-day dose. We will continue midodrine 5 mg 3 times daily for now.  She is trialing taking higher dose of Lasix in the mornings. Advised her to monitor home BP and notify us if it is > 140/80 on a consistent basis.   SVT: S/p ablation 06/27/22. HR is well controlled today. No tachy palpitations to her awareness. Metoprolol was changed from long-acting to short acting at hospital discharge. She reports she cannot tell a difference.  Severe AS s/p TAVR: She had severe low-flow low gradient AS and underwent TAVR 03/16/2021 with 29 mm CoreValve-Evolut Pro prosthetic. Echo 08/21/22 revealed normal LVEF 60-65%, G1DD, stable TAVR with no AI, peak gradient 13 mmHg and no paravalvular regurgitation. She denies shortness of breath, dyspnea, orthopnea, PND, chest pain, presyncope, syncope.  We will continue to monitor clinically for now.  CKD: Had elevated serum creatinine during admission. Labs revealed improvement with Scr 1.21, K 4.6 on 09/09/22. No medication changes today. She will see PCP in 2  weeks for labs. She is followed by nephrology.         Dispo: 6 weeks with me  Signed, Eligha Bridegroom, NP-C

## 2022-09-14 ENCOUNTER — Encounter: Payer: Self-pay | Admitting: Nurse Practitioner

## 2022-09-14 ENCOUNTER — Ambulatory Visit: Payer: Medicare PPO | Attending: Cardiology | Admitting: Nurse Practitioner

## 2022-09-14 VITALS — BP 120/76 | HR 84 | Ht 64.0 in | Wt 176.0 lb

## 2022-09-14 DIAGNOSIS — I5032 Chronic diastolic (congestive) heart failure: Secondary | ICD-10-CM

## 2022-09-14 DIAGNOSIS — I4719 Other supraventricular tachycardia: Secondary | ICD-10-CM

## 2022-09-14 DIAGNOSIS — I350 Nonrheumatic aortic (valve) stenosis: Secondary | ICD-10-CM | POA: Diagnosis not present

## 2022-09-14 DIAGNOSIS — I951 Orthostatic hypotension: Secondary | ICD-10-CM | POA: Diagnosis not present

## 2022-09-14 DIAGNOSIS — J90 Pleural effusion, not elsewhere classified: Secondary | ICD-10-CM | POA: Diagnosis not present

## 2022-09-14 DIAGNOSIS — Z952 Presence of prosthetic heart valve: Secondary | ICD-10-CM | POA: Diagnosis not present

## 2022-09-14 DIAGNOSIS — N1831 Chronic kidney disease, stage 3a: Secondary | ICD-10-CM | POA: Diagnosis not present

## 2022-09-14 MED ORDER — MIDODRINE HCL 5 MG PO TABS: 5.0000 mg | ORAL_TABLET | Freq: Three times a day (TID) | ORAL | 3 refills | Status: AC

## 2022-09-14 MED ORDER — FUROSEMIDE 40 MG PO TABS: 40.0000 mg | ORAL_TABLET | Freq: Two times a day (BID) | ORAL | 3 refills | Status: DC

## 2022-09-14 MED ORDER — POTASSIUM CHLORIDE CRYS ER 20 MEQ PO TBCR: 20.0000 meq | EXTENDED_RELEASE_TABLET | Freq: Every day | ORAL | 3 refills | Status: DC

## 2022-09-14 MED ORDER — METOPROLOL TARTRATE 25 MG PO TABS: 12.5000 mg | ORAL_TABLET | Freq: Two times a day (BID) | ORAL | 3 refills | Status: DC

## 2022-09-14 NOTE — Patient Instructions (Signed)
Medication Instructions:   CHANGE Lasix one (1) tablet by mouth ( 40 mg) twice daily.   *If you need a refill on your cardiac medications before your next appointment, please call your pharmacy*   Lab Work:  None ordered.  If you have labs (blood work) drawn today and your tests are completely normal, you will receive your results only by: MyChart Message (if you have MyChart) OR A paper copy in the mail If you have any lab test that is abnormal or we need to change your treatment, we will call you to review the results.   Testing/Procedures:  None ordered.   Follow-Up: At [facility], you and your health needs are our priority.  As part of our continuing mission to provide you with exceptional heart care, we have created designated Provider Care Teams.  These Care Teams include your primary Cardiologist (physician) and Advanced Practice Providers (APPs -  Physician Assistants and Nurse Practitioners) who all work together to provide you with the care you need, when you need it.  We recommend signing up for the patient portal called "MyChart".  Sign up information is provided on this After Visit Summary.  MyChart is used to connect with patients for Virtual Visits (Telemedicine).  Patients are able to view lab/test results, encounter notes, upcoming appointments, etc.  Non-urgent messages can be sent to your provider as well.   To learn more about what you can do with MyChart, go to [email or web address].    Your next appointment:   2 month(s)  Provider:   Eligha Bridegroom, NP         Other Instructions HOW TO TAKE YOUR BLOOD PRESSURE  Rest 5 minutes before taking your blood pressure. Don't  smoke or drink caffeinated beverages for at least 30 minutes before. Take your blood pressure before (not after) you eat. Sit comfortably with your back supported and both feet on the floor ( don't cross your legs). Elevate your arm to heart level on a table or a desk. Use  the proper sized cuff.  It should fit smoothly and snugly around your bare upper arm.  There should be  Enough room to slip a fingertip under the cuff.  The bottom edge of the cuff should be 1 inch above the crease Of the elbow. Please monitor your blood pressure once daily 2 hours after your am medication. If you blood pressure Consistently remains above 140 (systolic) top number or over 80 ( diastolic) bottom number X 3 days  Consecutively.  Please call our office at [phone] or send Mychart message.     ----Avoid cold medicines with D or DM at the end of them----    Heart Failure Education: Weigh yourself EVERY morning after you go to the bathroom but before you eat or drink anything. Write this number down in a weight log/diary. If you gain 3 pounds overnight or 5 pounds in a week, call the office. Take your medicines as prescribed. If you have concerns about your medications, please call us before you stop taking them.  Eat low salt foods--Limit salt (sodium) to 2000 mg per day. This will help prevent your body from holding onto fluid. Read food labels as many processed foods have a lot of sodium, especially canned goods and prepackaged meats. If you would like some assistance choosing low sodium foods, we would be happy to set you up with a nutritionist. Stay as active as you can everyday. Staying active will give you more energy and  make your muscles stronger. Start with 5 minutes at a time and work your way up to 30 minutes a day. Break up your activities--do some in the morning and some in the afternoon. Start with 3 days per week and work your way up to 5 days as you can.  If you have chest pain, feel short of breath, dizzy, or lightheaded, STOP. If you don't feel better after a short rest, call 911. If you do feel better, call the office to let us know you have symptoms with exercise. Limit all fluids for the day to less than 2 liters. Fluid includes all drinks, coffee, juice, ice  chips, soup, jello, and all other liquids.

## 2022-09-20 DIAGNOSIS — I509 Heart failure, unspecified: Secondary | ICD-10-CM | POA: Diagnosis not present

## 2022-09-20 DIAGNOSIS — J90 Pleural effusion, not elsewhere classified: Secondary | ICD-10-CM | POA: Diagnosis not present

## 2022-09-20 DIAGNOSIS — Z09 Encounter for follow-up examination after completed treatment for conditions other than malignant neoplasm: Secondary | ICD-10-CM | POA: Diagnosis not present

## 2022-09-20 DIAGNOSIS — Z6830 Body mass index (BMI) 30.0-30.9, adult: Secondary | ICD-10-CM | POA: Diagnosis not present

## 2022-09-20 DIAGNOSIS — I471 Supraventricular tachycardia, unspecified: Secondary | ICD-10-CM | POA: Diagnosis not present

## 2022-09-23 ENCOUNTER — Ambulatory Visit: Payer: Medicare PPO | Admitting: Cardiology

## 2022-09-27 DIAGNOSIS — I471 Supraventricular tachycardia, unspecified: Secondary | ICD-10-CM | POA: Diagnosis not present

## 2022-09-27 DIAGNOSIS — J90 Pleural effusion, not elsewhere classified: Secondary | ICD-10-CM | POA: Diagnosis not present

## 2022-09-27 DIAGNOSIS — I5032 Chronic diastolic (congestive) heart failure: Secondary | ICD-10-CM | POA: Diagnosis not present

## 2022-09-27 DIAGNOSIS — N1832 Chronic kidney disease, stage 3b: Secondary | ICD-10-CM | POA: Diagnosis not present

## 2022-09-27 DIAGNOSIS — Z6831 Body mass index (BMI) 31.0-31.9, adult: Secondary | ICD-10-CM | POA: Diagnosis not present

## 2022-10-10 ENCOUNTER — Ambulatory Visit: Payer: Medicare PPO | Admitting: Adult Health

## 2022-10-13 ENCOUNTER — Ambulatory Visit: Payer: Medicare PPO | Admitting: Adult Health

## 2022-10-27 NOTE — Progress Notes (Signed)
Cardiology Office Note:  .   Date:  10/28/2022  ID:  Nicole Graves, DOB 08-Jul-1945, MRN [id number] PCP: [name], MD  [facility] Providers Cardiologist:  [name], MD Electrophysiologist:  Lewayne Bunting, MD    Patient Profile: .      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  She was also found to have aortic valve disease which was followed with routine echocardiogram.  PAT improved on beta-blocker therapy.  He underwent successful TAVR with 29 mm Medtronic Evolut Pro-Plus via TF approacho on 03/16/21.    She later had worsening palpitations had documented SVT at 160 bpm.  X 4 lasting for over an hour.  She was referred to EP and underwent AVNRT ablation 06/27/2022 with Dr. Ladona Ridgel.   Admission 7/5-08/22/22 who presented to ED with shortness of breath noted to have large bilateral pleural effusions with compressive atelectasis . She underwent right thoracentesis. Unknown origin of pleural effusions, consideration given to recent mechanical fall and injury to right ribs. Consider HFpEF.   Return admission 7/15-7/26/24 for recurrent bilateral pleural effusions on CXR at PCP office.  She endorsed exertional dyspnea.Thoracentesis was not required. Improved with IV Lasix but concerns for hypotension. Suspect r/t CHF. Started on midodrine for hypotension. Seen by EP during admission due to concern for SVT on monitor. Felt to be sinus tachycardia. Recommendation to continue low dose metoprolol.   Seen in clinic by me on 09/14/22, accompanied by her daughter for follow-up.  Reported feeling well since hospital discharge. We discussed factors that may be associated with recurrence of pleural effusions.  Daughter states thoracentesis 08/19/22 revealed "old fluid" thought to be 2/2 TAVR 02/2021, while repeat occurrence was thought to be 2/2 CHF.  She fell prior to second admission and admits to poor inhalation/exhalation. Home weight has been stable. Prior to admission weight was in the 180s. Now base weight appears to be 171-173 lb. Has not been monitoring BP at home but denies lightheadedness, presyncope, syncope. Not having any chest pain or shortness of breath. Did water aerobics a few days prior with no cardiac symptoms.        History of Present Illness: .   Nicole Graves is a very pleasant 77 y.o. female who is here today with her daughter for follow-up. She reports she is feeling well. Has some dizziness when she wakes up but this usually resolves quickly. Weight has been stable.  Admits her activity has been limited to walking up and down their driveway, which is pretty long.  She has been hesitant to be more active for fear of falling.  She denies chest pain, shortness of breath, dyspnea, orthopnea, PND, edema, presyncope, syncope. CKD is followed by nephrology. At last office visit, she was told to return in 6 months rather than 12.  She has not been monitoring her BP consistently to determine what BP is prior to and after midodrine dose.   ROS: See HPI       Studies Reviewed: .         Risk Assessment/Calculations:             Physical Exam:  VS:  BP 120/80   Pulse 64   Ht 5\' 4"  (1.626 m)   Wt 175 lb 6.4 oz (79.6 kg)   SpO2 97%   BMI 30.11 kg/m    Wt Readings from Last 3 Encounters:  10/28/22 175 lb 6.4 oz (79.6 kg)  09/14/22 176 lb (79.8 kg)  09/09/22 170 lb 4.8 oz (77.2 kg)    GEN: Well nourished, well developed in no acute distress  NECK: No JVD; No carotid bruits CARDIAC: RRR, no murmurs, rubs, gallops RESPIRATORY:  Clear to auscultation  without rales, wheezing or rhonchi  ABDOMEN: Soft, non-tender, non-distended EXTREMITIES:  No edema; No deformity     ASSESSMENT AND PLAN: .    Chronic pleural effusion: Bilateral pleural effusions with thoracentesis on 08/19/22 with improvement then return of moderate bilateral effusions. Return admission, no thoracentesis performed. Discharged 09/09/22 with mild pleural effusion on left with CXR 09/07/22 that revealed unchanged small left pleural effusion and left basilar atelectasis. Since discharge, no further shortness of breath, orthopnea, PND. Lung sounds are clear bilaterally today.  No indication for further testing at this time.  HFpEF: LVEF 60-65%, G1DD on echo 08/21/22. Home weight has been stable. She denies shortness of breath, dyspnea, orthopnea, PND, edema. Feeling well on current dose of furosemide 80 mg every morning. Encouraged her to increase physical activity with goal of 150 minutes of walking or other moderate intensity exercise each week. Advised use of walking stick or stationary bicycle if she is concerned about falling. Advised continued daily weight, low sodium diet < 2000 mg, limit fluids to 2 L daily.   Hypotension: BP is stable. She has not monitored home BP on a consistent basis. I have asked her to document BP prior to and following midodrine and to report back to [place]. We will continue midodrine 5 mg 3 times daily for now.  SVT: S/p ablation 06/27/22. HR is well controlled today. No tachy palpitations to her awareness. HR is well controlled. Continue metoprolol.   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 Echo 08/21/22 revealed normal LVEF 60-65%, G1DD, stable TAVR with no AI, peak gradient 13 mmHg and no paravalvular regurgitation. She denies shortness of breath, dyspnea, orthopnea, PND, chest pain, presyncope, syncope.  We will continue to monitor clinically for now.  Hyperlipidemia LDL goal < 70: LDL 64  on 1/61/09.  She has upcoming appointment and lab work scheduled with PCP. Advised that if cholesterol testing is not completed at this appointment, we will plan to repeat at next office visit.   CKD: Had elevated serum creatinine during admission. Scr 1.290, eGFR 42, K+ 4.5 on 09/20/22. Has repeat lab work scheduled with PCP in one month. No acute concerns today. Management per nephrology.        Dispo: 6 months with Dr. [name]  Signed, Eligha Bridegroom, NP-C

## 2022-10-28 ENCOUNTER — Encounter: Payer: Self-pay | Admitting: Nurse Practitioner

## 2022-10-28 ENCOUNTER — Ambulatory Visit: Payer: Medicare PPO | Attending: Nurse Practitioner | Admitting: Nurse Practitioner

## 2022-10-28 VITALS — BP 120/80 | HR 64 | Ht 64.0 in | Wt 175.4 lb

## 2022-10-28 DIAGNOSIS — I5032 Chronic diastolic (congestive) heart failure: Secondary | ICD-10-CM | POA: Diagnosis not present

## 2022-10-28 DIAGNOSIS — Z952 Presence of prosthetic heart valve: Secondary | ICD-10-CM

## 2022-10-28 DIAGNOSIS — I350 Nonrheumatic aortic (valve) stenosis: Secondary | ICD-10-CM

## 2022-10-28 DIAGNOSIS — I471 Supraventricular tachycardia, unspecified: Secondary | ICD-10-CM

## 2022-10-28 DIAGNOSIS — J90 Pleural effusion, not elsewhere classified: Secondary | ICD-10-CM | POA: Diagnosis not present

## 2022-10-28 DIAGNOSIS — I951 Orthostatic hypotension: Secondary | ICD-10-CM

## 2022-10-28 DIAGNOSIS — N1831 Chronic kidney disease, stage 3a: Secondary | ICD-10-CM

## 2022-10-28 MED ORDER — POTASSIUM CHLORIDE CRYS ER 20 MEQ PO TBCR: 20.0000 meq | EXTENDED_RELEASE_TABLET | Freq: Every day | ORAL | Status: DC

## 2022-10-28 MED ORDER — FUROSEMIDE 40 MG PO TABS: 40.0000 mg | ORAL_TABLET | Freq: Two times a day (BID) | ORAL | Status: DC

## 2022-10-28 MED ORDER — METOPROLOL TARTRATE 25 MG PO TABS: 12.5000 mg | ORAL_TABLET | Freq: Two times a day (BID) | ORAL | 3 refills | Status: DC

## 2022-10-28 NOTE — Patient Instructions (Signed)
Medication Instructions:   Your physician recommends that you continue on your current medications as directed. Please refer to the Current Medication list given to you today.   *If you need a refill on your cardiac medications before your next appointment, please call your pharmacy*   Lab Work:  None ordered.  If you have labs (blood work) drawn today and your tests are completely normal, you will receive your results only by: MyChart Message (if you have MyChart) OR A paper copy in the mail If you have any lab test that is abnormal or we need to change your treatment, we will call you to review the results.   Testing/Procedures:  None ordered   Follow-Up: At [facility], you and your health needs are our priority.  As part of our continuing mission to provide you with exceptional heart care, we have created designated Provider Care Teams.  These Care Teams include your primary Cardiologist (physician) and Advanced Practice Providers (APPs -  Physician Assistants and Nurse Practitioners) who all work together to provide you with the care you need, when you need it.  We recommend signing up for the patient portal called "MyChart".  Sign up information is provided on this After Visit Summary.  MyChart is used to connect with patients for Virtual Visits (Telemedicine).  Patients are able to view lab/test results, encounter notes, upcoming appointments, etc.  Non-urgent messages can be sent to your provider as well.   To learn more about what you can do with MyChart, go to [email or web address].    Your next appointment:   6 month(s)  Provider:   [name], MD     Other Instructions  If your cholesterol  is not checked by Dr. Hyacinth Meeker you will need to come fasting to your Dr. [name] appointment and get your cholesterol checked than.

## 2022-12-09 DIAGNOSIS — N1831 Chronic kidney disease, stage 3a: Secondary | ICD-10-CM | POA: Diagnosis not present

## 2022-12-13 DIAGNOSIS — E669 Obesity, unspecified: Secondary | ICD-10-CM | POA: Diagnosis not present

## 2022-12-13 DIAGNOSIS — E7800 Pure hypercholesterolemia, unspecified: Secondary | ICD-10-CM | POA: Diagnosis not present

## 2022-12-13 DIAGNOSIS — Z23 Encounter for immunization: Secondary | ICD-10-CM | POA: Diagnosis not present

## 2022-12-13 DIAGNOSIS — N1831 Chronic kidney disease, stage 3a: Secondary | ICD-10-CM | POA: Diagnosis not present

## 2022-12-13 DIAGNOSIS — I471 Supraventricular tachycardia, unspecified: Secondary | ICD-10-CM | POA: Diagnosis not present

## 2022-12-13 DIAGNOSIS — J90 Pleural effusion, not elsewhere classified: Secondary | ICD-10-CM | POA: Diagnosis not present

## 2022-12-13 DIAGNOSIS — F325 Major depressive disorder, single episode, in full remission: Secondary | ICD-10-CM | POA: Diagnosis not present

## 2022-12-13 DIAGNOSIS — M810 Age-related osteoporosis without current pathological fracture: Secondary | ICD-10-CM | POA: Diagnosis not present

## 2022-12-21 DIAGNOSIS — E1122 Type 2 diabetes mellitus with diabetic chronic kidney disease: Secondary | ICD-10-CM | POA: Diagnosis not present

## 2022-12-21 DIAGNOSIS — M810 Age-related osteoporosis without current pathological fracture: Secondary | ICD-10-CM | POA: Diagnosis not present

## 2022-12-21 DIAGNOSIS — N2581 Secondary hyperparathyroidism of renal origin: Secondary | ICD-10-CM | POA: Diagnosis not present

## 2022-12-21 DIAGNOSIS — N1831 Chronic kidney disease, stage 3a: Secondary | ICD-10-CM | POA: Diagnosis not present

## 2022-12-21 DIAGNOSIS — I959 Hypotension, unspecified: Secondary | ICD-10-CM | POA: Diagnosis not present

## 2022-12-21 DIAGNOSIS — Z23 Encounter for immunization: Secondary | ICD-10-CM | POA: Diagnosis not present

## 2022-12-21 DIAGNOSIS — Z1331 Encounter for screening for depression: Secondary | ICD-10-CM | POA: Diagnosis not present

## 2022-12-21 DIAGNOSIS — Z0000 Encounter for general adult medical examination without abnormal findings: Secondary | ICD-10-CM | POA: Diagnosis not present

## 2022-12-21 DIAGNOSIS — Z9181 History of falling: Secondary | ICD-10-CM | POA: Diagnosis not present

## 2022-12-21 DIAGNOSIS — D631 Anemia in chronic kidney disease: Secondary | ICD-10-CM | POA: Diagnosis not present

## 2022-12-21 DIAGNOSIS — Z6831 Body mass index (BMI) 31.0-31.9, adult: Secondary | ICD-10-CM | POA: Diagnosis not present

## 2023-02-06 ENCOUNTER — Other Ambulatory Visit: Payer: Self-pay | Admitting: Nurse Practitioner

## 2023-03-20 DIAGNOSIS — E7800 Pure hypercholesterolemia, unspecified: Secondary | ICD-10-CM | POA: Diagnosis not present

## 2023-04-19 ENCOUNTER — Ambulatory Visit: Payer: Medicare PPO | Admitting: Cardiology

## 2023-04-21 ENCOUNTER — Ambulatory Visit: Payer: Medicare PPO | Attending: Cardiology | Admitting: Cardiology

## 2023-04-21 ENCOUNTER — Encounter: Payer: Self-pay | Admitting: Cardiology

## 2023-04-21 VITALS — BP 116/72 | HR 72 | Ht 64.0 in | Wt 182.7 lb

## 2023-04-21 DIAGNOSIS — I350 Nonrheumatic aortic (valve) stenosis: Secondary | ICD-10-CM | POA: Diagnosis not present

## 2023-04-21 DIAGNOSIS — Z952 Presence of prosthetic heart valve: Secondary | ICD-10-CM

## 2023-04-21 DIAGNOSIS — I471 Supraventricular tachycardia, unspecified: Secondary | ICD-10-CM | POA: Diagnosis not present

## 2023-04-21 DIAGNOSIS — I951 Orthostatic hypotension: Secondary | ICD-10-CM | POA: Diagnosis not present

## 2023-04-21 NOTE — Progress Notes (Signed)
 Date:  04/21/2023   ID:  Kresha, Abelson 07-10-1945, MRN [id number]   PCP:  [name], MD  Cardiologist: [name], MD Electrophysiologist:  Lewayne Bunting, MD   Chief Complaint:  Aortic stenosis  History of Present Illness:    Nicole Graves is a 78 y.o. female with a hx of AS, AI and SVT.  2D echo 12/03/2020 showed normal LV function with moderate LVH, mild MR and moderate to severe aortic valve stenosis with mean aortic valve gradient 25 mmHg, DI 0.25 and stroke-volume index reduced to 28 consistent with low-flow low gradient moderate to severe aortic stenosis in the setting of normal LV function.  She ultimately underwent TAVR on 03/16/2021 with a 29 mm Medtronic Evolute Pro-Plus via the TF approach.  Postop echo showed normal LV function with EF 55% and a mean TAVR gradient of 9 mmHg with trivial paravalvular leak at 1 o'clock position.  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    Admission 7/5-08/22/22 who presented to ED with shortness of breath noted to have large bilateral pleural effusions with compressive atelectasis . She underwent right thoracentesis. Unknown origin of pleural effusions, consideration given to recent mechanical fall and injury to right ribs. Consider HFpEF.    Return admission 7/15-7/26/24 for recurrent bilateral pleural effusions on CXR at PCP office.  She endorsed exertional dyspnea.Thoracentesis was not required. Improved with IV Lasix but concerns for hypotension. Suspect r/t CHF. Started on midodrine for hypotension. Seen by EP during admission due to concern for SVT on monitor. Felt to be sinus tachycardia. Recommendation to continue low dose metoprolol. 2D echo 08/2022 showed EF 60-65% with G1DD, mild PHTN with PASP , trivial MR stable TAVR.    She is here today for followup and is doing well.  She denies any chest pain or pressure, SOB, DOE, PND, orthopnea, LE edema, dizziness (except when getting up too fast or  turning her head too fast), palpitations or syncope. She is compliant with her meds and is tolerating meds with no SE.     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 Lynnette Caffey and Dr. Laneta Simmers   Severe aortic stenosis    Stage 3b chronic kidney disease (CKD) (HCC)    SVT (supraventricular tachycardia) (HCC)    TR (tricuspid regurgitation) 09/19/2016   mild by echo 09/2021   Past Surgical History:  Procedure Laterality Date   CESAREAN SECTION  1964   X2   CHOLECYSTECTOMY     COSMETIC SURGERY  1980   removal extra fat inner thighs   EYE SURGERY Bilateral     Cataract extraction with IOL   INTRAOPERATIVE TRANSTHORACIC ECHOCARDIOGRAM N/A 03/16/2021   Procedure: INTRAOPERATIVE TRANSTHORACIC ECHOCARDIOGRAM;  Surgeon: Orbie Pyo,  MD;  Location: [facility] OR;  Service: Open Heart Surgery;  Laterality: N/A;   RIGHT HEART CATH AND CORONARY ANGIOGRAPHY N/A 02/17/2021   Procedure: RIGHT HEART CATH AND CORONARY ANGIOGRAPHY;  Surgeon: Orbie Pyo, MD;  Location: [facility];  Service: Cardiovascular;  Laterality: N/A;   SVT ABLATION N/A 06/27/2022   Procedure: SVT ABLATION;  Surgeon: Marinus Maw, MD;  Location: [facility];  Service: Cardiovascular;  Laterality: N/A;   TOTAL KNEE ARTHROPLASTY Right 11/30/2015   Procedure: RIGHT TOTAL KNEE ARTHROPLASTY;  Surgeon: Ollen Gross, MD;  Location:  [facility] OR[facility];  Service: Orthopedics;  Laterality: Right;   TOTAL KNEE ARTHROPLASTY Left 11/14/2016   Procedure: LEFT TOTAL KNEE ARTHROPLASTY;  Surgeon: Ollen Gross, MD;  Location: [facility];  Service: Orthopedics;  Laterality: Left;   TRANSCATHETER AORTIC VALVE REPLACEMENT, TRANSFEMORAL Right 03/16/2021   Procedure: TRANSCATHETER AORTIC VALVE REPLACEMENT, TRANSFEMORAL;  Surgeon: Orbie Pyo, MD;  Location: [facility] OR;  Service: Open Heart Surgery;  Laterality: Right;     Current Meds  Medication Sig   acetaminophen (TYLENOL) 650 MG CR tablet Take 1,300 mg by mouth in the morning and at bedtime.   alendronate (FOSAMAX) 70 MG tablet Take 70 mg by mouth every Thursday.   aspirin EC 81 MG tablet Take 81 mg by mouth daily. Swallow whole.   atorvastatin (LIPITOR) 80 MG tablet Take 80 mg by mouth daily.   buPROPion (WELLBUTRIN XL) 300 MG 24 hr tablet Take 300 mg by mouth daily.   cyclobenzaprine (FLEXERIL) 10 MG tablet Take 1 tablet (10 mg total) by mouth at bedtime.   DULoxetine (CYMBALTA) 30 MG capsule Take 90 mg by mouth daily after breakfast.   famotidine (PEPCID) 20 MG tablet Take 20 mg by mouth daily as needed for heartburn.   ferrous sulfate 325 (65 FE) MG tablet Take 325 mg by mouth daily.   furosemide (LASIX) 40 MG tablet Take 0.5 tablets (20 mg total) by mouth 2 (two) times daily.   gabapentin (NEURONTIN) 300 MG capsule Take 300 mg by mouth 3 (three) times daily.   Melatonin 5 MG CAPS Take 1 capsule by mouth at bedtime.   metoprolol tartrate (LOPRESSOR) 25 MG tablet Take 0.5 tablets (12.5 mg total) by mouth 2 (two) times daily.   midodrine (PROAMATINE) 5 MG tablet Take 5 mg by mouth 3 (three) times daily.   potassium chloride SA (KLOR-CON M) 20 MEQ tablet Take 1 tablet (20 mEq total) by mouth daily.     Allergies:   Sulfa antibiotics, Ativan [lorazepam], and Tizanidine hcl   Social History   Tobacco Use   Smoking status: Never   Smokeless tobacco: Never  Vaping Use   Vaping status: Never  Used  Substance Use Topics   Alcohol use: No   Drug use: No     Family Hx: The patient's family history includes Colon cancer in her mother; Heart attack in her brother; Hypertension in her brother. There is no history of Stroke.  ROS:   Please see the history of present illness.     All other systems reviewed and are negative.   Labs/Other Tests and Data Reviewed:    Recent Labs: 08/20/2022: TSH 1.847 09/01/2022: ALT 10 09/05/2022: B Natriuretic Peptide 96.8 09/07/2022: Hemoglobin 8.1; Magnesium 2.1; Platelets 586 09/08/2022: BUN 29; Creatinine, Ser 1.09; Potassium 3.6; Sodium 135   Recent Lipid Panel Lab Results  Component Value Date/Time   CHOL 136 09/23/2021 04:55 AM   TRIG 64 09/23/2021 04:55 AM  HDL 59 09/23/2021 04:55 AM   CHOLHDL 2.3 09/23/2021 04:55 AM   LDLCALC 64 09/23/2021 04:55 AM    Wt Readings from Last 3 Encounters:  04/21/23 182 lb 10.4 oz (82.8 kg)  10/28/22 175 lb 6.4 oz (79.6 kg)  09/14/22 176 lb (79.8 kg)     Objective:    Vital Signs:  BP 116/72   Pulse 72   Ht 5\' 4"  (1.626 m)   Wt 182 lb 10.4 oz (82.8 kg)   SpO2 96%   BMI 31.35 kg/m    GEN: Well nourished, well developed in no acute distress HEENT: Normal NECK: No JVD; No carotid bruits LYMPHATICS: No lymphadenopathy CARDIAC:RRR, no murmurs, rubs, gallops RESPIRATORY:  Clear to auscultation without rales, wheezing or rhonchi  ABDOMEN: Soft, non-tender, non-distended MUSCULOSKELETAL:  No edema; No deformity  SKIN: Warm and dry NEUROLOGIC:  Alert and oriented x 3 PSYCHIATRIC:  Normal affect  ASSESSMENT & PLAN:    1.  Paroxysmal atrial tachycardia -Maintaining NSR On exam today with no palpitations -continue prescription drug management with Lopressor 12.5mg  BID with PRN refills  2.  Aortic stenosis -2D echo 09/2017 showed normal LVF with mild AS and mean AVG .   -2D echo 12/03/2020 showed normal LV function with moderate LVH, mild MR and moderate to severe aortic valve stenosis  with mean aortic valve gradient 25 mmHg, DI 0.25 and stroke-volume index reduced to 28 consistent with low-flow low gradient moderate to severe aortic stenosis in the setting of normal LV function -Status post TAVR on 03/16/2021 with a 29 mm Medtronic Evolute Pro-Plus via the TF approach.   -Postop echo showed normal LV function with EF 55% and a mean TAVR gradient of 9 mmHg with trivial paravalvular leak at 1 o'clock position.  3.  Orthostatic hypotension -no syncope but still has problems with vertigo as well as dizziness if she stands up too fast    Medication Adjustments/Labs and Tests Ordered: Current medicines are reviewed at length with the patient today.  Concerns regarding medicines are outlined above.  Tests Ordered: No orders of the defined types were placed in this encounter.   Medication Changes: No orders of the defined types were placed in this encounter.    Disposition:  Follow up in 6 months  Signed, [name], MD  04/21/2023 10:49 AM    [facility]

## 2023-04-21 NOTE — Patient Instructions (Signed)
 Follow-Up: At [facility], you and your health needs are our priority.  As part of our continuing mission to provide you with exceptional heart care, we have created designated Provider Care Teams.  These Care Teams include your primary Cardiologist (physician) and Advanced Practice Providers (APPs -  Physician Assistants and Nurse Practitioners) who all work together to provide you with the care you need, when you need it.  We recommend signing up for the patient portal called "MyChart".  Sign up information is provided on this After Visit Summary.  MyChart is used to connect with patients for Virtual Visits (Telemedicine).  Patients are able to view lab/test results, encounter notes, upcoming appointments, etc.  Non-urgent messages can be sent to your provider as well.   To learn more about what you can do with MyChart, go to [email or web address].    Your next appointment:   1 year(s)  Provider:   [name], MD   Other Instructions   1st Floor: - Lobby - Registration  - Pharmacy  - Lab - Cafe  2nd Floor: - PV Lab - Diagnostic Testing (echo, CT, nuclear med)  3rd Floor: - Vacant  4th Floor: - TCTS (cardiothoracic surgery) - AFib Clinic - Structural Heart Clinic - Vascular Surgery  - Vascular Ultrasound  5th Floor: - HeartCare Cardiology (general and EP) - Clinical Pharmacy for coumadin, hypertension, lipid, weight-loss medications, and med management appointments    Valet parking services will be available as well.

## 2023-06-26 DIAGNOSIS — N1832 Chronic kidney disease, stage 3b: Secondary | ICD-10-CM | POA: Diagnosis not present

## 2023-06-26 DIAGNOSIS — E7800 Pure hypercholesterolemia, unspecified: Secondary | ICD-10-CM | POA: Diagnosis not present

## 2023-07-03 ENCOUNTER — Other Ambulatory Visit: Payer: Self-pay | Admitting: Nurse Practitioner

## 2023-07-06 DIAGNOSIS — M810 Age-related osteoporosis without current pathological fracture: Secondary | ICD-10-CM | POA: Diagnosis not present

## 2023-07-06 DIAGNOSIS — F325 Major depressive disorder, single episode, in full remission: Secondary | ICD-10-CM | POA: Diagnosis not present

## 2023-07-06 DIAGNOSIS — I471 Supraventricular tachycardia, unspecified: Secondary | ICD-10-CM | POA: Diagnosis not present

## 2023-07-06 DIAGNOSIS — E669 Obesity, unspecified: Secondary | ICD-10-CM | POA: Diagnosis not present

## 2023-07-06 DIAGNOSIS — N1832 Chronic kidney disease, stage 3b: Secondary | ICD-10-CM | POA: Diagnosis not present

## 2023-07-06 DIAGNOSIS — I5032 Chronic diastolic (congestive) heart failure: Secondary | ICD-10-CM | POA: Diagnosis not present

## 2023-07-06 DIAGNOSIS — M4722 Other spondylosis with radiculopathy, cervical region: Secondary | ICD-10-CM | POA: Diagnosis not present

## 2023-08-13 ENCOUNTER — Encounter (HOSPITAL_BASED_OUTPATIENT_CLINIC_OR_DEPARTMENT_OTHER): Payer: Self-pay

## 2023-08-14 ENCOUNTER — Telehealth: Payer: Self-pay | Admitting: Cardiology

## 2023-08-14 MED ORDER — FUROSEMIDE 80 MG PO TABS: 80.0000 mg | ORAL_TABLET | Freq: Every day | ORAL | 3 refills | Status: AC

## 2023-08-14 MED ORDER — FUROSEMIDE 40 MG PO TABS: ORAL_TABLET | ORAL | 3 refills | Status: DC

## 2023-08-14 NOTE — Addendum Note (Signed)
 Addended by: TANDA ERMA HERO on: 08/14/2023 04:08 PM   Modules accepted: Orders

## 2023-08-14 NOTE — Telephone Encounter (Signed)
 Pt c/o medication issue:  1. Name of Medication: Furosemide   2. How are you currently taking this medication (dosage and times per day)?   3. Are you having a reaction (difficulty breathing--STAT)?   4. What is your medication issue? Need to know the correct dose of this medicine

## 2023-08-14 NOTE — Telephone Encounter (Signed)
 Ok to continue 40mg  two tablets together in am instead of 40mg  twice daily?

## 2023-08-14 NOTE — Telephone Encounter (Signed)
 Will forward this to our [facility] team for further management, for I see they have been working on this matter with the pt today.

## 2023-08-14 NOTE — Telephone Encounter (Signed)
 Patient has been taking Lasix  20 mg 2 every morning since hospital d/c 08/2022 (see mychart message 6/29)  Will forward to Rosaline RAMAN NP for review

## 2023-09-01 DIAGNOSIS — M8588 Other specified disorders of bone density and structure, other site: Secondary | ICD-10-CM | POA: Diagnosis not present

## 2023-09-01 DIAGNOSIS — Z1231 Encounter for screening mammogram for malignant neoplasm of breast: Secondary | ICD-10-CM | POA: Diagnosis not present

## 2023-09-01 DIAGNOSIS — R29890 Loss of height: Secondary | ICD-10-CM | POA: Diagnosis not present

## 2023-09-29 ENCOUNTER — Other Ambulatory Visit: Payer: Self-pay

## 2023-09-29 ENCOUNTER — Encounter: Payer: Self-pay | Admitting: Cardiology

## 2023-09-29 MED ORDER — AMOXICILLIN 500 MG PO TABS: 2000.0000 mg | ORAL_TABLET | ORAL | 3 refills | Status: AC | PRN

## 2023-10-27 ENCOUNTER — Encounter: Payer: Self-pay | Admitting: Cardiology

## 2023-11-01 ENCOUNTER — Ambulatory Visit (HOSPITAL_BASED_OUTPATIENT_CLINIC_OR_DEPARTMENT_OTHER): Admitting: Nurse Practitioner
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
## 2023-11-17 ENCOUNTER — Ambulatory Visit: Attending: Physician Assistant | Admitting: Physician Assistant

## 2023-11-17 ENCOUNTER — Encounter: Payer: Self-pay | Admitting: Physician Assistant

## 2023-11-17 VITALS — BP 134/70 | HR 67 | Ht 64.0 in | Wt 187.0 lb

## 2023-11-17 DIAGNOSIS — I350 Nonrheumatic aortic (valve) stenosis: Secondary | ICD-10-CM

## 2023-11-17 DIAGNOSIS — Z952 Presence of prosthetic heart valve: Secondary | ICD-10-CM | POA: Diagnosis not present

## 2023-11-17 DIAGNOSIS — I340 Nonrheumatic mitral (valve) insufficiency: Secondary | ICD-10-CM | POA: Diagnosis not present

## 2023-11-17 DIAGNOSIS — I471 Supraventricular tachycardia, unspecified: Secondary | ICD-10-CM | POA: Diagnosis not present

## 2023-11-17 DIAGNOSIS — I5032 Chronic diastolic (congestive) heart failure: Secondary | ICD-10-CM | POA: Diagnosis not present

## 2023-11-17 DIAGNOSIS — J90 Pleural effusion, not elsewhere classified: Secondary | ICD-10-CM | POA: Diagnosis not present

## 2023-11-17 DIAGNOSIS — I951 Orthostatic hypotension: Secondary | ICD-10-CM | POA: Diagnosis not present

## 2023-11-17 MED ORDER — MIDODRINE HCL 2.5 MG PO TABS: 2.5000 mg | ORAL_TABLET | Freq: Three times a day (TID) | ORAL | 2 refills | Status: AC

## 2023-11-17 NOTE — Patient Instructions (Addendum)
 Medication Instructions:   START TAKING:  MIDODRINE  2.5 MG THREE TIMES A  DAY   *If you need a refill on your cardiac medications before your next appointment, please call your pharmacy*  Lab Work: NONE ORDERED  TODAY   If you have labs (blood work) drawn today and your tests are completely normal, you will receive your results only by: MyChart Message (if you have MyChart) OR A paper copy in the mail If you have any lab test that is abnormal or we need to change your treatment, we will call you to review the results.  Testing/Procedures: Your physician has requested that you have an echocardiogram. Echocardiography is a painless test that uses sound waves to create images of your heart. It provides your doctor with information about the size and shape of your heart and how well your heart's chambers and valves are working. This procedure takes approximately one hour. There are no restrictions for this procedure. Please do NOT wear cologne, perfume, aftershave, or lotions (deodorant is allowed). Please arrive 15 minutes prior to your appointment time.  Please note: We ask at that you not bring children with you during ultrasound (echo/ vascular) testing. Due to room size and safety concerns, children are not allowed in the ultrasound rooms during exams. Our front office staff cannot provide observation of children in our lobby area while testing is being conducted. An adult accompanying a patient to their appointment will only be allowed in the ultrasound room at the discretion of the ultrasound technician under special circumstances. We apologize for any inconvenience.   Follow-Up: At [facility], you and your health needs are our priority.  As part of our continuing mission to provide you with exceptional heart care, our providers are all part of one team.  This team includes your primary Cardiologist (physician) and Advanced Practice Providers or APPs (Physician Assistants and  Nurse Practitioners) who all work together to provide you with the care you need, when you need it.  Your next appointment:  6 month(s)   Provider:   Wilbert Bihari, MD    We recommend signing up for the patient portal called MyChart.  Sign up information is provided on this After Visit Summary.  MyChart is used to connect with patients for Virtual Visits (Telemedicine).  Patients are able to view lab/test results, encounter notes, upcoming appointments, etc.  Non-urgent messages can be sent to your provider as well.   To learn more about what you can do with MyChart, go to [email or web address].   Other Instructions  Low-Sodium Eating Plan Salt (sodium) helps you keep a healthy balance of fluids in your body. Too much sodium can raise your blood pressure. It can also cause fluid and waste to be held in your body. Your health care provider or dietitian may recommend a low-sodium eating plan if you have high blood pressure (hypertension), kidney disease, liver disease, or heart failure. Eating less sodium can help lower your blood pressure and reduce swelling. It can also protect your heart, liver, and kidneys. What are tips for following this plan? Reading food labels  Check food labels for the amount of sodium per serving. If you eat more than one serving, you must multiply the listed amount by the number of servings. Choose foods with less than 140 milligrams (mg) of sodium per serving. Avoid foods with 300 mg of sodium or more per serving. Always check how much sodium is in a product, even if the label says unsalted or no  salt added. Shopping  Buy products labeled as low-sodium or no salt added. Buy fresh foods. Avoid canned foods and pre-made or frozen meals. Avoid canned, cured, or processed meats. Buy breads that have less than 80 mg of sodium per slice. Cooking  Eat more home-cooked food. Try to eat less restaurant, buffet, and fast food. Try not to add salt when you  cook. Use salt-free seasonings or herbs instead of table salt or sea salt. Check with your provider or pharmacist before using salt substitutes. Cook with plant-based oils, such as canola, sunflower, or olive oil. Meal planning When eating at a restaurant, ask if your food can be made with less salt or no salt. Avoid dishes labeled as brined, pickled, cured, or smoked. Avoid dishes made with soy sauce, miso, or teriyaki sauce. Avoid foods that have monosodium glutamate (MSG) in them. MSG may be added to some restaurant food, sauces, soups, bouillon, and canned foods. Make meals that can be grilled, baked, poached, roasted, or steamed. These are often made with less sodium. General information Try to limit your sodium intake to 1,500-2,300 mg each day, or the amount told by your provider. What foods should I eat? Fruits Fresh, frozen, or canned fruit. Fruit juice. Vegetables Fresh or frozen vegetables. No salt added canned vegetables. No salt added tomato sauce and paste. Low-sodium or reduced-sodium tomato and vegetable juice. Grains Low-sodium cereals, such as oats, puffed wheat and rice, and shredded wheat. Low-sodium crackers. Unsalted rice. Unsalted pasta. Low-sodium bread. Whole grain breads and whole grain pasta. Meats and other proteins Fresh or frozen meat, poultry, seafood, and fish. These should have no added salt. Low-sodium canned tuna and salmon. Unsalted nuts. Dried peas, beans, and lentils without added salt. Unsalted canned beans. Eggs. Unsalted nut butters. Dairy Milk. Soy milk. Cheese that is naturally low in sodium, such as ricotta cheese, fresh mozzarella, or Swiss cheese. Low-sodium or reduced-sodium cheese. Cream cheese. Yogurt. Seasonings and condiments Fresh and dried herbs and spices. Salt-free seasonings. Low-sodium mustard and ketchup. Sodium-free salad dressing. Sodium-free light mayonnaise. Fresh or refrigerated horseradish. Lemon juice. Vinegar. Other  foods Homemade, reduced-sodium, or low-sodium soups. Unsalted popcorn and pretzels. Low-salt or salt-free chips. The items listed above may not be all the foods and drinks you can have. Talk to a dietitian to learn more. What foods should I avoid? Vegetables Sauerkraut, pickled vegetables, and relishes. Olives. [place] fries. Onion rings. Regular canned vegetables, except low-sodium or reduced-sodium items. Regular canned tomato sauce and paste. Regular tomato and vegetable juice. Frozen vegetables in sauces. Grains Instant hot cereals. Bread stuffing, pancake, and biscuit mixes. Croutons. Seasoned rice or pasta mixes. Noodle soup cups. Boxed or frozen macaroni and cheese. Regular salted crackers. Self-rising flour. Meats and other proteins Meat or fish that is salted, canned, smoked, spiced, or pickled. Precooked or cured meat, such as sausages or meat loaves. [place]. Ham. Pepperoni. Hot dogs. Corned beef. Chipped beef. Salt pork. Jerky. Pickled herring, anchovies, and sardines. Regular canned tuna. Salted nuts. Dairy Processed cheese and cheese spreads. Hard cheeses. Cheese curds. Blue cheese. Feta cheese. String cheese. Regular cottage cheese. Buttermilk. Canned milk. Fats and oils Salted butter. Regular margarine. Ghee. Bacon fat. Seasonings and condiments Onion salt, garlic salt, seasoned salt, table salt, and sea salt. Canned and packaged gravies. Worcestershire sauce. Tartar sauce. Barbecue sauce. Teriyaki sauce. Soy sauce, including reduced-sodium soy sauce. Steak sauce. Fish sauce. Oyster sauce. Cocktail sauce. Horseradish that you find on the shelf. Regular ketchup and mustard. Meat flavorings and tenderizers.  Bouillon cubes. Hot sauce. Pre-made or packaged marinades. Pre-made or packaged taco seasonings. Relishes. Regular salad dressings. Salsa. Other foods Salted popcorn and pretzels. Corn chips and puffs. Potato and tortilla chips. Canned or dried soups. Pizza. Frozen entrees and pot  pies. The items listed above may not be all the foods and drinks you should avoid. Talk to a dietitian to learn more. This information is not intended to replace advice given to you by your health care provider. Make sure you discuss any questions you have with your health care provider. Document Revised: 02/17/2022 Document Reviewed: 02/17/2022 Elsevier Patient Education  2024 [employer].

## 2023-12-18 DIAGNOSIS — N1831 Chronic kidney disease, stage 3a: Secondary | ICD-10-CM | POA: Diagnosis not present

## 2023-12-18 DIAGNOSIS — M4722 Other spondylosis with radiculopathy, cervical region: Secondary | ICD-10-CM | POA: Diagnosis not present

## 2023-12-18 DIAGNOSIS — F325 Major depressive disorder, single episode, in full remission: Secondary | ICD-10-CM | POA: Diagnosis not present

## 2023-12-18 DIAGNOSIS — M8580 Other specified disorders of bone density and structure, unspecified site: Secondary | ICD-10-CM | POA: Diagnosis not present

## 2023-12-18 DIAGNOSIS — I471 Supraventricular tachycardia, unspecified: Secondary | ICD-10-CM | POA: Diagnosis not present

## 2023-12-18 DIAGNOSIS — N1832 Chronic kidney disease, stage 3b: Secondary | ICD-10-CM | POA: Diagnosis not present

## 2023-12-18 DIAGNOSIS — E669 Obesity, unspecified: Secondary | ICD-10-CM | POA: Diagnosis not present

## 2023-12-18 DIAGNOSIS — E7800 Pure hypercholesterolemia, unspecified: Secondary | ICD-10-CM | POA: Diagnosis not present

## 2024-01-05 ENCOUNTER — Other Ambulatory Visit (HOSPITAL_COMMUNITY)

## 2024-01-05 DIAGNOSIS — N2581 Secondary hyperparathyroidism of renal origin: Secondary | ICD-10-CM | POA: Diagnosis not present

## 2024-01-05 DIAGNOSIS — N1831 Chronic kidney disease, stage 3a: Secondary | ICD-10-CM | POA: Diagnosis not present

## 2024-01-05 DIAGNOSIS — D631 Anemia in chronic kidney disease: Secondary | ICD-10-CM | POA: Diagnosis not present

## 2024-01-05 DIAGNOSIS — E785 Hyperlipidemia, unspecified: Secondary | ICD-10-CM | POA: Diagnosis not present

## 2024-01-05 DIAGNOSIS — I959 Hypotension, unspecified: Secondary | ICD-10-CM | POA: Diagnosis not present

## 2024-01-05 DIAGNOSIS — E1122 Type 2 diabetes mellitus with diabetic chronic kidney disease: Secondary | ICD-10-CM | POA: Diagnosis not present

## 2024-01-19 ENCOUNTER — Ambulatory Visit (HOSPITAL_COMMUNITY)
Admission: RE | Admit: 2024-01-19 | Discharge: 2024-01-19 | Attending: Physician Assistant | Admitting: Physician Assistant

## 2024-01-19 DIAGNOSIS — Z952 Presence of prosthetic heart valve: Secondary | ICD-10-CM | POA: Diagnosis not present

## 2024-01-19 LAB — ECHOCARDIOGRAM COMPLETE
AR max vel: 3.81 cm2
AV Area VTI: 3.77 cm2
AV Area mean vel: 3.77 cm2
AV Mean grad: 5 mmHg
AV Peak grad: 8.9 mmHg
Ao pk vel: 1.49 m/s
Area-P 1/2: 2.97 cm2
S' Lateral: 2.55 cm

## 2024-01-22 ENCOUNTER — Ambulatory Visit: Payer: Self-pay | Admitting: Physician Assistant

## 2024-02-06 ENCOUNTER — Telehealth
